# Patient Record
Sex: Female | Born: 1965 | Hispanic: No | Marital: Married | Smoking: Former smoker
Health system: Southern US, Community
[De-identification: ages and names within clinical notes are randomized; demographics above are authoritative.]

## PROBLEM LIST (undated history)

## (undated) DIAGNOSIS — Z8619 Personal history of other infectious and parasitic diseases: Secondary | ICD-10-CM

## (undated) DIAGNOSIS — G709 Myoneural disorder, unspecified: Secondary | ICD-10-CM

## (undated) DIAGNOSIS — E785 Hyperlipidemia, unspecified: Secondary | ICD-10-CM

## (undated) DIAGNOSIS — J45909 Unspecified asthma, uncomplicated: Secondary | ICD-10-CM

## (undated) DIAGNOSIS — K219 Gastro-esophageal reflux disease without esophagitis: Secondary | ICD-10-CM

## (undated) DIAGNOSIS — F32A Depression, unspecified: Secondary | ICD-10-CM

## (undated) DIAGNOSIS — M1990 Unspecified osteoarthritis, unspecified site: Secondary | ICD-10-CM

## (undated) DIAGNOSIS — T7840XA Allergy, unspecified, initial encounter: Secondary | ICD-10-CM

## (undated) DIAGNOSIS — I872 Venous insufficiency (chronic) (peripheral): Secondary | ICD-10-CM

## (undated) DIAGNOSIS — I10 Essential (primary) hypertension: Secondary | ICD-10-CM

## (undated) DIAGNOSIS — F419 Anxiety disorder, unspecified: Secondary | ICD-10-CM

## (undated) DIAGNOSIS — E119 Type 2 diabetes mellitus without complications: Secondary | ICD-10-CM

## (undated) DIAGNOSIS — F329 Major depressive disorder, single episode, unspecified: Secondary | ICD-10-CM

## (undated) HISTORY — DX: Depression, unspecified: F32.A

## (undated) HISTORY — DX: Type 2 diabetes mellitus without complications: E11.9

## (undated) HISTORY — DX: Anxiety disorder, unspecified: F41.9

## (undated) HISTORY — DX: Venous insufficiency (chronic) (peripheral): I87.2

## (undated) HISTORY — PX: BILATERAL CARPAL TUNNEL RELEASE: SHX6508

## (undated) HISTORY — DX: Hyperlipidemia, unspecified: E78.5

## (undated) HISTORY — DX: Allergy, unspecified, initial encounter: T78.40XA

## (undated) HISTORY — PX: TUBAL LIGATION: SHX77

## (undated) HISTORY — DX: Myoneural disorder, unspecified: G70.9

## (undated) HISTORY — PX: LAPAROSCOPIC LYSIS OF ADHESIONS: SHX5905

## (undated) HISTORY — DX: Personal history of other infectious and parasitic diseases: Z86.19

## (undated) HISTORY — DX: Major depressive disorder, single episode, unspecified: F32.9

## (undated) HISTORY — DX: Unspecified osteoarthritis, unspecified site: M19.90

## (undated) HISTORY — DX: Gastro-esophageal reflux disease without esophagitis: K21.9

---
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
## 1998-02-01 ENCOUNTER — Ambulatory Visit (HOSPITAL_COMMUNITY): Admission: RE | Admit: 1998-02-01 | Discharge: 1998-02-01 | Payer: Self-pay | Admitting: Obstetrics and Gynecology

## 2004-08-16 ENCOUNTER — Emergency Department: Payer: Self-pay | Admitting: Emergency Medicine

## 2007-08-16 ENCOUNTER — Emergency Department: Payer: Self-pay | Admitting: Internal Medicine

## 2007-09-04 ENCOUNTER — Emergency Department: Payer: Self-pay | Admitting: Emergency Medicine

## 2007-11-09 ENCOUNTER — Ambulatory Visit: Payer: Self-pay | Admitting: Family Medicine

## 2008-06-30 ENCOUNTER — Emergency Department: Payer: Self-pay | Admitting: Internal Medicine

## 2008-08-18 ENCOUNTER — Ambulatory Visit: Payer: Self-pay | Admitting: Family Medicine

## 2009-02-12 ENCOUNTER — Ambulatory Visit: Payer: Self-pay | Admitting: Cardiovascular Disease

## 2009-03-16 ENCOUNTER — Ambulatory Visit: Payer: Self-pay | Admitting: Family Medicine

## 2009-03-24 ENCOUNTER — Ambulatory Visit: Payer: Self-pay | Admitting: Orthopedic Surgery

## 2009-04-12 ENCOUNTER — Encounter: Payer: Self-pay | Admitting: Orthopedic Surgery

## 2009-04-25 ENCOUNTER — Encounter: Payer: Self-pay | Admitting: Orthopedic Surgery

## 2009-05-26 ENCOUNTER — Encounter: Payer: Self-pay | Admitting: Orthopedic Surgery

## 2009-06-07 ENCOUNTER — Ambulatory Visit: Payer: Self-pay | Admitting: Family Medicine

## 2009-06-23 ENCOUNTER — Ambulatory Visit: Payer: Self-pay | Admitting: Gastroenterology

## 2009-06-29 ENCOUNTER — Ambulatory Visit: Payer: Self-pay | Admitting: Orthopedic Surgery

## 2009-07-07 ENCOUNTER — Encounter: Payer: Self-pay | Admitting: Orthopedic Surgery

## 2009-07-26 ENCOUNTER — Encounter: Payer: Self-pay | Admitting: Orthopedic Surgery

## 2009-08-25 ENCOUNTER — Ambulatory Visit: Payer: Self-pay | Admitting: Internal Medicine

## 2009-08-25 HISTORY — PX: SHOULDER SURGERY: SHX246

## 2009-09-01 ENCOUNTER — Ambulatory Visit: Payer: Self-pay | Admitting: Internal Medicine

## 2009-09-20 ENCOUNTER — Ambulatory Visit: Payer: Self-pay | Admitting: Orthopedic Surgery

## 2009-09-23 ENCOUNTER — Ambulatory Visit: Payer: Self-pay | Admitting: Orthopedic Surgery

## 2009-09-25 ENCOUNTER — Ambulatory Visit: Payer: Self-pay | Admitting: Internal Medicine

## 2009-10-04 ENCOUNTER — Encounter: Payer: Self-pay | Admitting: Orthopedic Surgery

## 2009-10-26 ENCOUNTER — Encounter: Payer: Self-pay | Admitting: Orthopedic Surgery

## 2010-01-11 LAB — HM PAP SMEAR: HM PAP: NEGATIVE

## 2010-01-13 ENCOUNTER — Ambulatory Visit: Payer: Self-pay | Admitting: Orthopedic Surgery

## 2010-01-20 ENCOUNTER — Ambulatory Visit: Payer: Self-pay | Admitting: Orthopedic Surgery

## 2010-01-24 ENCOUNTER — Ambulatory Visit: Payer: Self-pay | Admitting: Family Medicine

## 2010-02-10 ENCOUNTER — Ambulatory Visit: Payer: Self-pay | Admitting: Family Medicine

## 2010-02-10 LAB — HM MAMMOGRAPHY: HM MAMMO: NEGATIVE

## 2010-04-05 ENCOUNTER — Ambulatory Visit: Payer: Self-pay | Admitting: Otolaryngology

## 2010-04-12 ENCOUNTER — Ambulatory Visit: Payer: Self-pay | Admitting: Otolaryngology

## 2010-11-11 ENCOUNTER — Ambulatory Visit: Payer: Self-pay | Admitting: Anesthesiology

## 2010-11-15 ENCOUNTER — Ambulatory Visit: Payer: Self-pay | Admitting: Orthopedic Surgery

## 2012-04-23 ENCOUNTER — Ambulatory Visit: Payer: Self-pay | Admitting: Family Medicine

## 2014-09-10 LAB — HEMOGLOBIN A1C, FINGERSTICK
ESTIMATED AVERAGE GLUCOSE: 326
HEMOGLOBIN-A1C: 13.9

## 2014-12-01 LAB — HEMOGLOBIN A1C, FINGERSTICK
ESTIMATED AVERAGE GLUCOSE: 326
GLUCOSE: 431
HEMOGLOBIN-A1C: 13.4

## 2015-04-06 ENCOUNTER — Telehealth: Payer: Self-pay | Admitting: Family Medicine

## 2015-04-06 NOTE — Telephone Encounter (Signed)
Patient reports that she is ok to wait until Thursday. Scheduled patient in same day slot. Hope that was ok. If not, i can call patient back to reschedule. Thanks!

## 2015-04-06 NOTE — Telephone Encounter (Signed)
My schedule is full on Wednesday, but there are plenty of openings on Thursday. If she is having any fever or significant pain and can't wait until Thursday, then she will need to go to Urgent Care.

## 2015-04-06 NOTE — Telephone Encounter (Signed)
Pt needs appt for tomorrow for a knot on her back.  Please advise as to what time. Thanks, Fortune Brandsteri

## 2015-04-07 ENCOUNTER — Encounter: Payer: Self-pay | Admitting: *Deleted

## 2015-04-07 DIAGNOSIS — F419 Anxiety disorder, unspecified: Secondary | ICD-10-CM

## 2015-04-07 DIAGNOSIS — F32A Depression, unspecified: Secondary | ICD-10-CM | POA: Insufficient documentation

## 2015-04-07 DIAGNOSIS — E11319 Type 2 diabetes mellitus with unspecified diabetic retinopathy without macular edema: Secondary | ICD-10-CM | POA: Insufficient documentation

## 2015-04-07 DIAGNOSIS — F329 Major depressive disorder, single episode, unspecified: Secondary | ICD-10-CM | POA: Insufficient documentation

## 2015-04-07 DIAGNOSIS — R208 Other disturbances of skin sensation: Secondary | ICD-10-CM | POA: Insufficient documentation

## 2015-04-07 DIAGNOSIS — E10649 Type 1 diabetes mellitus with hypoglycemia without coma: Secondary | ICD-10-CM | POA: Insufficient documentation

## 2015-04-07 DIAGNOSIS — Z8249 Family history of ischemic heart disease and other diseases of the circulatory system: Secondary | ICD-10-CM | POA: Insufficient documentation

## 2015-04-07 DIAGNOSIS — Z8619 Personal history of other infectious and parasitic diseases: Secondary | ICD-10-CM | POA: Insufficient documentation

## 2015-04-07 DIAGNOSIS — E785 Hyperlipidemia, unspecified: Secondary | ICD-10-CM | POA: Insufficient documentation

## 2015-04-07 DIAGNOSIS — I872 Venous insufficiency (chronic) (peripheral): Secondary | ICD-10-CM | POA: Insufficient documentation

## 2015-04-07 DIAGNOSIS — E139 Other specified diabetes mellitus without complications: Secondary | ICD-10-CM | POA: Insufficient documentation

## 2015-04-07 DIAGNOSIS — G4700 Insomnia, unspecified: Secondary | ICD-10-CM | POA: Insufficient documentation

## 2015-04-07 DIAGNOSIS — K219 Gastro-esophageal reflux disease without esophagitis: Secondary | ICD-10-CM | POA: Insufficient documentation

## 2015-04-07 DIAGNOSIS — Z720 Tobacco use: Secondary | ICD-10-CM | POA: Insufficient documentation

## 2015-04-07 DIAGNOSIS — J309 Allergic rhinitis, unspecified: Secondary | ICD-10-CM | POA: Insufficient documentation

## 2015-04-07 HISTORY — DX: Personal history of other infectious and parasitic diseases: Z86.19

## 2015-04-08 ENCOUNTER — Ambulatory Visit (INDEPENDENT_AMBULATORY_CARE_PROVIDER_SITE_OTHER): Payer: Medicaid Other | Admitting: Family Medicine

## 2015-04-08 ENCOUNTER — Encounter: Payer: Self-pay | Admitting: Family Medicine

## 2015-04-08 VITALS — BP 128/78 | HR 84 | Temp 98.1°F | Resp 16 | Ht 62.5 in | Wt 210.0 lb

## 2015-04-08 DIAGNOSIS — J0110 Acute frontal sinusitis, unspecified: Secondary | ICD-10-CM | POA: Diagnosis not present

## 2015-04-08 DIAGNOSIS — L0390 Cellulitis, unspecified: Secondary | ICD-10-CM

## 2015-04-08 DIAGNOSIS — F419 Anxiety disorder, unspecified: Secondary | ICD-10-CM

## 2015-04-08 DIAGNOSIS — L0291 Cutaneous abscess, unspecified: Secondary | ICD-10-CM

## 2015-04-08 DIAGNOSIS — J309 Allergic rhinitis, unspecified: Secondary | ICD-10-CM | POA: Diagnosis not present

## 2015-04-08 MED ORDER — LEVOFLOXACIN 500 MG PO TABS: 500.0000 mg | ORAL_TABLET | Freq: Every day | ORAL | Status: DC

## 2015-04-08 MED ORDER — CLONAZEPAM 0.5 MG PO TABS: 0.5000 mg | ORAL_TABLET | Freq: Every day | ORAL | Status: DC

## 2015-04-08 MED ORDER — FLUTICASONE PROPIONATE 50 MCG/ACT NA SUSP: 2.0000 | Freq: Every day | NASAL | Status: DC

## 2015-04-08 NOTE — Progress Notes (Signed)
Subjective:    Patient ID: Emily Collins, female    DOB: [birth year], 49 y.o.   MRN: [id number]  Sinusitis The current episode started more than 1 month ago. The problem has been gradually worsening since onset. There has been no fever. Her pain is at a severity of 9/10. The pain is severe. Associated symptoms include congestion, coughing, ear pain, headaches, sinus pressure, sneezing and a sore throat. Pertinent negatives include no shortness of breath. Past treatments include spray decongestants, acetaminophen and oral decongestants. The treatment provided no relief.   Abscess: Patient presents for evaluation of a cutaneous abscess. Lesion is located on her back (Below her right shoulder. Onset was 1 month ago. Symptoms have gradually worsened. Abscess has associated symptoms of pain. Patient does not have previous history of cutaneous abscesses. Patient does have diabetes.  Patient Active Problem List   Diagnosis Date Noted  . Allergic rhinitis 04/07/2015  . History of chicken pox 04/07/2015  . Depression 04/07/2015  . Diabetes mellitus type 1.5,uncontrolled, managed as type 2 04/07/2015  . Diabetic retinopathy 04/07/2015  . Dysesthesia 04/07/2015  . Family history of early CAD 04/07/2015  . GERD (gastroesophageal reflux disease) 04/07/2015  . Hyperlipidemia 04/07/2015  . Insomnia 04/07/2015  . Tobacco abuse 04/07/2015  . Venous insufficiency 04/07/2015  . Anxiety 04/07/2015   Family History  Problem Relation Age of Onset  . Hypertension Mother   . Diabetes Mother   . Hyperlipidemia Mother   . Diabetes Father   . Heart disease Father   . Kidney disease Father    History   Social History  . Marital Status: Married    Spouse Name: Jonny Ruiz  . Number of Children: 3  . Years of Education: H/S   Occupational History  . Housewife    Social History Main Topics  . Smoking status: Current Every Day Smoker -- 1.00 packs/day for 30 years    Types: Cigarettes    Start date:  04/06/1985  . Smokeless tobacco: Never Used  . Alcohol Use: No  . Drug Use: No  . Sexual Activity: Not on file   Other Topics Concern  . Not on file   Social History Narrative   Past Surgical History  Procedure Laterality Date  . Cesarean section  2003  . Appendectomy  1979  . Tubal ligation    . Shoulder surgery Right 08/2009    Dr. Rosita Kea  . Bilateral carpal tunnel release Bilateral     Dr. Rosita Kea  . Laparoscopic lysis of adhesions     Allergies  Allergen Reactions  . Levaquin  [Levofloxacin In D5w]     GI upset  . Simvastatin     joint aches.  . Dilaudid  [Hydromorphone Hcl] Hives, Itching and Rash  . Penicillins Rash  . Tetracycline Rash   Previous Medications   ATORVASTATIN (LIPITOR) 10 MG TABLET    Take by mouth.   CETIRIZINE (ZYRTEC ALLERGY) 10 MG TABLET    Take by mouth.   CLONAZEPAM (KLONOPIN) 0.5 MG TABLET    Take by mouth.   FLUOXETINE (PROZAC) 40 MG CAPSULE    Take by mouth.   FLUTICASONE (FLONASE) 50 MCG/ACT NASAL SPRAY    Place into the nose.   FUROSEMIDE (LASIX) 40 MG TABLET    Take by mouth.   INSULIN ASPART (NOVOLOG) 100 UNIT/ML INJECTION    Inject into the skin.   INSULIN GLARGINE (LANTUS SOLOSTAR) 100 UNIT/ML SOLOSTAR PEN    Inject into the skin.   METFORMIN (  GLUCOPHAGE-XR) 500 MG 24 HR TABLET    Take by mouth.   RANITIDINE (ZANTAC) 150 MG TABLET    Take by mouth.   BP 128/78 mmHg  Pulse 84  Temp(Src) 98.1 F (36.7 C) (Oral)  Resp 16  Ht 5' 2.5" (1.588 m)  Wt 210 lb (95.255 kg)  BMI 37.77 kg/m2  LMP 04/07/2015 (Exact Date)   Review of Systems  HENT: Positive for congestion, dental problem (Teeth hurt), ear pain, facial swelling, nosebleeds, postnasal drip, rhinorrhea, sinus pressure, sneezing, sore throat, tinnitus, trouble swallowing and voice change. Negative for ear discharge and hearing loss.   Eyes: Positive for discharge and itching. Negative for photophobia, pain, redness and visual disturbance.  Respiratory: Positive for cough and  wheezing. Negative for apnea, choking, chest tightness, shortness of breath and stridor.   Cardiovascular: Negative for chest pain, palpitations and leg swelling.  Gastrointestinal: Negative for nausea, vomiting, abdominal pain, diarrhea, constipation, blood in stool, abdominal distention, anal bleeding and rectal pain.  Skin: Negative for color change, pallor, rash and wound.  Allergic/Immunologic: Positive for environmental allergies. Negative for food allergies and immunocompromised state.  Neurological: Positive for headaches. Negative for dizziness and light-headedness.       Objective:   Physical Exam  General Appearance:    Alert, cooperative, no distress  Eyes:    PERRL, conjunctiva/corneas clear, EOM's intact       ENT:   Congested hyperemic turbinates with clear and yellow discharge, mild maxillary sinus tenderness bilaterally.   Lungs:     Clear to auscultation bilaterally, respirations unlabored  Heart:    Regular rate and rhythm  Neurologic:   Awake, alert, oriented x 3. No apparent focal neurological           defect.   Skin:  about 3cm oval fluctuate subcutaneous mass below right shoulder with overlying erythema and mild tenderness, no discharge.           Assessment & Plan:  1. Acute frontal sinusitis, recurrence not specified  - levofloxacin (LEVAQUIN) 500 MG tablet; Take 1 tablet (500 mg total) by mouth daily.  Dispense: 14 tablet; Refill: 0  2. Abscess and cellulitis Call if not greatly improved over the weekend on abx above.    3. Acute anxiety -clonazePAM (KLONOPIN) 0.5 MG tablet; Take 1 tablet (0.5 mg total) by mouth at bedtime.  Dispense: 30 tablet; Refill: 5  4. Allergic rhinitis, unspecified allergic rhinitis type Fluticasone nasal spray.

## 2015-04-29 ENCOUNTER — Other Ambulatory Visit: Payer: Self-pay | Admitting: Family Medicine

## 2015-05-13 ENCOUNTER — Telehealth: Payer: Self-pay | Admitting: Family Medicine

## 2015-05-13 DIAGNOSIS — J0110 Acute frontal sinusitis, unspecified: Secondary | ICD-10-CM

## 2015-05-13 MED ORDER — LEVOFLOXACIN 500 MG PO TABS: 500.0000 mg | ORAL_TABLET | Freq: Every day | ORAL | Status: AC

## 2015-05-13 NOTE — Telephone Encounter (Signed)
Pt stated that when she was last here for an OV (04/08/15) she was told that she might need more than one round of antibiotics and pt would like the second round sent to [facility] in [place]. Pt stated that she woke up with a lot of sinus pressure this morning. I advised that since it had been over a month she may need to come back in for OV but pt wanted to ask Dr. Sherrie Mustache first. I didn't see an antibiotic from 04/08/15. Thanks TNP

## 2015-05-13 NOTE — Telephone Encounter (Signed)
Please advise 

## 2015-07-08 ENCOUNTER — Ambulatory Visit (INDEPENDENT_AMBULATORY_CARE_PROVIDER_SITE_OTHER): Payer: Medicaid Other | Admitting: Family Medicine

## 2015-07-08 ENCOUNTER — Other Ambulatory Visit: Payer: Self-pay | Admitting: Family Medicine

## 2015-07-08 ENCOUNTER — Encounter: Payer: Self-pay | Admitting: Family Medicine

## 2015-07-08 VITALS — BP 110/58 | HR 91 | Temp 98.6°F | Resp 16 | Wt 212.0 lb

## 2015-07-08 DIAGNOSIS — F419 Anxiety disorder, unspecified: Secondary | ICD-10-CM | POA: Diagnosis not present

## 2015-07-08 DIAGNOSIS — E139 Other specified diabetes mellitus without complications: Secondary | ICD-10-CM | POA: Diagnosis not present

## 2015-07-08 DIAGNOSIS — L0390 Cellulitis, unspecified: Secondary | ICD-10-CM | POA: Diagnosis not present

## 2015-07-08 DIAGNOSIS — Z23 Encounter for immunization: Secondary | ICD-10-CM

## 2015-07-08 DIAGNOSIS — Z720 Tobacco use: Secondary | ICD-10-CM | POA: Diagnosis not present

## 2015-07-08 DIAGNOSIS — J40 Bronchitis, not specified as acute or chronic: Secondary | ICD-10-CM

## 2015-07-08 DIAGNOSIS — E113299 Type 2 diabetes mellitus with mild nonproliferative diabetic retinopathy without macular edema, unspecified eye: Secondary | ICD-10-CM

## 2015-07-08 DIAGNOSIS — E785 Hyperlipidemia, unspecified: Secondary | ICD-10-CM

## 2015-07-08 DIAGNOSIS — L0291 Cutaneous abscess, unspecified: Secondary | ICD-10-CM

## 2015-07-08 DIAGNOSIS — J309 Allergic rhinitis, unspecified: Secondary | ICD-10-CM | POA: Diagnosis not present

## 2015-07-08 LAB — POCT GLYCOSYLATED HEMOGLOBIN (HGB A1C)
Est. average glucose Bld gHb Est-mCnc: >326
Hemoglobin A1C: 14

## 2015-07-08 MED ORDER — BENZONATATE 100 MG PO CAPS: 100.0000 mg | ORAL_CAPSULE | Freq: Two times a day (BID) | ORAL | Status: DC | PRN

## 2015-07-08 MED ORDER — MONTELUKAST SODIUM 10 MG PO TABS: 10.0000 mg | ORAL_TABLET | Freq: Every day | ORAL | Status: DC

## 2015-07-08 MED ORDER — CLONAZEPAM 0.5 MG PO TABS: 0.5000 mg | ORAL_TABLET | Freq: Every day | ORAL | Status: DC

## 2015-07-08 MED ORDER — AZITHROMYCIN 250 MG PO TABS: ORAL_TABLET | ORAL | Status: AC

## 2015-07-08 NOTE — Progress Notes (Signed)
Patient: Emily Collins Female    DOB: 15-Apr-1966   49 y.o.   MRN: [id number] Visit Date: 07/08/2015  Today's Provider: Mila Merryonald Zakyra [place], MD   Chief Complaint  Patient presents with  . Follow-up  . Hyperlipidemia  . Anxiety  . Allergic Rhinitis    Subjective:    HPI   Cough She states she has had persistent cough for at least a month. Has had some sinus and nasal congestion and drainage which has been relatively well managed by OTC allergy tablet and Flonase. She had normal spirometry in 2011, but she has cut back smoking quite a bit since then. No fevers or chills. Cough productive white and yellow mucous.    Diabetes Mellitus Type II, Follow-up:   Lab Results  Component Value Date   HGBA1C 13.4 12/01/2014   HGBA1C 13.9 09/10/2014    Last seen for diabetes 7 months ago.  Management since then includes; referred to Endocrinology . She reports good compliance with treatment. She is not having side effects. none Current symptoms include none and have been resolved. Home blood sugar records: fasting range: 88-188  Episodes of hypoglycemia? no   Current Insulin Regimen: 65 units Lantus daily and sliding scale novolog.  Most Recent Eye Exam: due Weight trend: fluctuating a bit Prior visit with dietician: no Current diet: in general, an "unhealthy" diet Current exercise: none   Wt Readings from Last 3 Encounters:  07/08/15 212 lb (96.163 kg)  04/08/15 210 lb (95.255 kg)  12/01/14 200 lb (90.719 kg)    ------------------------------------------------------------------------     Lipid/Cholesterol, Follow-up:   Last seen for this7 months ago.  Management changes since that visit include; changed Lipitor 10 mg qd Risk factors for vascular disease include diabetes mellitus  She reports good compliance with treatment. She is not having side effects.   Current exercise: none  Wt Readings from Last 3 Encounters:  07/08/15 212 lb (96.163 kg)  04/08/15  210 lb (95.255 kg)  12/01/14 200 lb (90.719 kg)    -------------------------------------------------------------------   Allergies  Allergen Reactions  . Levaquin  [Levofloxacin In D5w]     GI upset  . Simvastatin     joint aches.  . Dilaudid  [Hydromorphone Hcl] Hives, Itching and Rash  . Penicillins Rash  . Tetracycline Rash   Previous Medications   ATORVASTATIN (LIPITOR) 10 MG TABLET    Take by mouth.   CETIRIZINE (ZYRTEC ALLERGY) 10 MG TABLET    Take by mouth.   CLONAZEPAM (KLONOPIN) 0.5 MG TABLET    Take 1 tablet (0.5 mg total) by mouth at bedtime.   FLUOXETINE (PROZAC) 40 MG CAPSULE    Take by mouth.   FLUTICASONE (FLONASE) 50 MCG/ACT NASAL SPRAY    Place into the nose.   FLUTICASONE (FLONASE) 50 MCG/ACT NASAL SPRAY    Place 2 sprays into both nostrils daily.   FUROSEMIDE (LASIX) 40 MG TABLET    Take by mouth.   INSULIN ASPART (NOVOLOG) 100 UNIT/ML INJECTION    Inject into the skin.   LANTUS SOLOSTAR 100 UNIT/ML SOLOSTAR PEN    ADMINISTER 65 UNITS UNDER THE SKIN DAILY   METFORMIN (GLUCOPHAGE-XR) 500 MG 24 HR TABLET    Take by mouth.   RANITIDINE (ZANTAC) 150 MG TABLET    Take by mouth.    Review of Systems  Constitutional: Negative for fever.  HENT: Positive for congestion and ear pain.        Left  Respiratory: Positive  for cough, shortness of breath and wheezing.        Had cough for over a month  Cardiovascular: Positive for leg swelling. Negative for chest pain and palpitations.  Neurological: Positive for dizziness and headaches. Negative for light-headedness.    Social History  Substance Use Topics  . Smoking status: Current Every Day Smoker -- 1.00 packs/day for 30 years    Types: Cigarettes    Start date: 04/06/1985  . Smokeless tobacco: Never Used  . Alcohol Use: No   Objective:   BP 110/58 mmHg  Pulse 91  Temp(Src) 98.6 F (37 C) (Oral)  Resp 16  Wt 212 lb (96.163 kg)  SpO2 98%  Physical Exam   General Appearance:    Alert, cooperative, no  distress, obese  Eyes:    PERRL, conjunctiva/corneas clear, EOM's intact       HEENT:   Pale boggy turbinates.   Lungs:     Diffuse expiratory wheezes, no rales, respirations unlabored  Heart:    Regular rate and rhythm  Neurologic:   Awake, alert, oriented x 3. No apparent focal neurological           defect.       Results for orders placed or performed in visit on 07/08/15  POCT glycosylated hemoglobin (Hb A1C)  Result Value Ref Range   Hemoglobin A1C 14.0    Est. average glucose Bld gHb Est-mCnc >326        Assessment & Plan:     1. Diabetes mellitus type 1.5,uncontrolled, managed as type 2 Worsening despite patient reports of improving fasting sugars since restarting Lantus. She is to records her sugars. Refer to new endocrinologist and bring copy of BS recordings.  - POCT glycosylated hemoglobin (Hb A1C) - Renal function panel - Ambulatory referral to Endocrinology  2. Allergic rhinitis, unspecified allergic rhinitis type -montelukast  daily  3. Tobacco abuse Stop smoking  4. Bronchitis  - azithromycin (ZITHROMAX) 250 MG tablet; 2 by mouth today, then 1 daily for 4 days  Dispense: 6 tablet; Refill: 0 - benzonatate (TESSALON) 100 MG capsule; Take 1 capsule (100 mg total) by mouth 2 (two) times daily as needed for cough.  Dispense: 20 capsule; Refill: 0  5. Need for influenza vaccination  - Flu Vaccine QUAD 36+ mos IM  6. Abscess and cellulitis Resolved.   7. 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 Hyperlipidemia She is tolerating atorvastatin well with no adverse effects.   - Lipid panel - Hepatic function panel  9. Anxiety Needs refill clonazepam which she states is working well.  - clonazePAM (KLONOPIN) 0.5 MG tablet; Take 1 tablet (0.5 mg total) by mouth at bedtime.  Dispense: 30 tablet; Refill: 5       Mila Merry, MD  [facility]

## 2015-07-08 NOTE — Patient Instructions (Signed)
Please stop smoking 

## 2015-07-29 ENCOUNTER — Other Ambulatory Visit: Payer: Self-pay | Admitting: Family Medicine

## 2015-07-29 DIAGNOSIS — F329 Major depressive disorder, single episode, unspecified: Secondary | ICD-10-CM

## 2015-07-29 DIAGNOSIS — F32A Depression, unspecified: Secondary | ICD-10-CM

## 2015-09-21 ENCOUNTER — Other Ambulatory Visit: Payer: Self-pay | Admitting: Family Medicine

## 2015-10-08 ENCOUNTER — Ambulatory Visit: Payer: Medicaid Other | Admitting: Family Medicine

## 2015-12-15 ENCOUNTER — Other Ambulatory Visit: Payer: Self-pay | Admitting: Family Medicine

## 2015-12-24 ENCOUNTER — Other Ambulatory Visit: Payer: Self-pay | Admitting: Family Medicine

## 2016-01-12 ENCOUNTER — Other Ambulatory Visit: Payer: Self-pay | Admitting: Family Medicine

## 2016-01-24 ENCOUNTER — Other Ambulatory Visit: Payer: Self-pay | Admitting: Family Medicine

## 2016-02-09 ENCOUNTER — Other Ambulatory Visit: Payer: Self-pay | Admitting: Family Medicine

## 2016-03-03 ENCOUNTER — Other Ambulatory Visit: Payer: Self-pay | Admitting: Family Medicine

## 2016-03-03 DIAGNOSIS — E139 Other specified diabetes mellitus without complications: Secondary | ICD-10-CM

## 2016-03-04 NOTE — Telephone Encounter (Signed)
Last OV 06/2015; pt "no showed" 09/2015  Thanks,   -Vernona RiegerLaura

## 2016-03-22 ENCOUNTER — Other Ambulatory Visit: Payer: Self-pay | Admitting: Family Medicine

## 2016-04-05 ENCOUNTER — Encounter: Payer: Self-pay | Admitting: Family Medicine

## 2016-04-05 ENCOUNTER — Other Ambulatory Visit: Payer: Self-pay | Admitting: Family Medicine

## 2016-04-05 ENCOUNTER — Ambulatory Visit (INDEPENDENT_AMBULATORY_CARE_PROVIDER_SITE_OTHER): Payer: Medicaid Other | Admitting: Family Medicine

## 2016-04-05 VITALS — BP 120/80 | HR 80 | Temp 98.0°F | Resp 20 | Ht 63.0 in | Wt 219.0 lb

## 2016-04-05 DIAGNOSIS — E113299 Type 2 diabetes mellitus with mild nonproliferative diabetic retinopathy without macular edema, unspecified eye: Secondary | ICD-10-CM

## 2016-04-05 DIAGNOSIS — E139 Other specified diabetes mellitus without complications: Secondary | ICD-10-CM

## 2016-04-05 DIAGNOSIS — N182 Chronic kidney disease, stage 2 (mild): Secondary | ICD-10-CM | POA: Diagnosis not present

## 2016-04-05 DIAGNOSIS — N189 Chronic kidney disease, unspecified: Secondary | ICD-10-CM | POA: Insufficient documentation

## 2016-04-05 DIAGNOSIS — M2550 Pain in unspecified joint: Secondary | ICD-10-CM

## 2016-04-05 DIAGNOSIS — K219 Gastro-esophageal reflux disease without esophagitis: Secondary | ICD-10-CM

## 2016-04-05 LAB — POCT UA - MICROALBUMIN: MICROALBUMIN (UR) POC: 50 mg/L

## 2016-04-05 LAB — POCT GLYCOSYLATED HEMOGLOBIN (HGB A1C): HEMOGLOBIN A1C: 13.1

## 2016-04-05 MED ORDER — INSULIN GLARGINE 300 UNIT/ML ~~LOC~~ SOPN: 40.0000 [IU] | PEN_INJECTOR | Freq: Two times a day (BID) | SUBCUTANEOUS | Status: DC

## 2016-04-05 MED ORDER — MELOXICAM 15 MG PO TABS: 15.0000 mg | ORAL_TABLET | Freq: Every day | ORAL | Status: DC | PRN

## 2016-04-05 NOTE — Patient Instructions (Signed)
Check blood sugar before each meal      If blood sugar is >=150, but <200, take 4 units Humalog insulin before eating     If blood sugar is >=200, but <250, take 6 units Humalog insulin     If blood sugar is >=250, but <300, take 8 units Humalog insulin     If blood sugar is >=300, but <350, take 10 units Humalog insulin     If blood sugar is >=350, but <400, take 12 units Humalog insulin     If blood sugar is >400, take 14 units Humalog insulin

## 2016-04-05 NOTE — Progress Notes (Signed)
Patient: Emily Collins Female    DOB: 1966-03-11   50 y.o.   MRN: [id number] Visit Date: 04/05/2016  Today's Provider: Lelon Huh, MD   Chief Complaint  Patient presents with  . Diabetes  . Hyperlipidemia  . Joint Pain   Subjective:    HPI  Diabetes Mellitus Type II, Follow-up:   Lab Results  Component Value Date   HGBA1C 13.1 04/05/2016   HGBA1C 14.0 07/08/2015   HGBA1C 13.4 12/01/2014    Last seen for diabetes 9 months ago.  Management since then includes referred to endocrinology. Patient reports that she has not been able to go to endo due to her husbands medical problems. She reports good compliance with treatment. She is not having side effects. Current symptoms include hyperglycemia, paresthesia of the feet and visual disturbances and have been unchanged. Home blood sugar records: fasting range: 220's  Episodes of hypoglycemia? no   Current Insulin Regimen: Humalog on a sliding scale, Lantus 40 unit BID for the last two weeks. Was previously on 10 daily.  On sliding scale Humalog up to 6 units before meals.  She wants to change Lantus to Toujeo and start Jardiance.  Most Recent Eye Exam: Weight trend: stable Prior visit with dietician: no Current diet: in general, a "healthy" diet   Current exercise: none  Pertinent Labs: No results found for: CHOL, TRIG, HDL, LDLCALC, CREATININE  Wt Readings from Last 3 Encounters:  04/05/16 219 lb (99.338 kg)  07/08/15 212 lb (96.163 kg)  04/08/15 210 lb (95.255 kg)   ------------------------------------------------------------------------   Lipid/Cholesterol, Follow-up:   Last seen for this 9 months ago.  Management changes since that visit include labs ordered. . Last Lipid Panel: No results found for: CHOL, TRIG, HDL, CHOLHDL, VLDL, LDLCALC, LDLDIRECT  Risk factors for vascular disease include diabetes mellitus  She reports good compliance with treatment. She is not having side effects.    Current symptoms include hyperglycemia and have been unchanged. ------------------------------------------------------------------  Joint pain: Patient reports joint pain all over body for several years. Patient reports her pain is worsening and takes Advil 400 mg daily. She reports she has seen Dr. Jefm Bryant in the past and diagnosed with psoriatic arthritis.     Allergies  Allergen Reactions  . Levaquin  [Levofloxacin In D5w]     GI upset  . Simvastatin     joint aches.  . Dilaudid  [Hydromorphone Hcl] Hives, Itching and Rash  . Penicillins Rash  . Tetracycline Rash   Current Meds  Medication Sig  . atorvastatin (LIPITOR) 10 MG tablet Take 10 mg by mouth daily at 6 PM.   . cetirizine (ZYRTEC ALLERGY) 10 MG tablet Take 10 mg by mouth daily.   . clonazePAM (KLONOPIN) 0.5 MG tablet Take 1 tablet (0.5 mg total) by mouth at bedtime.  Marland Kitchen esomeprazole (NEXIUM) 20 MG capsule Take 20 mg by mouth daily at 12 noon.  Marland Kitchen FLUoxetine (PROZAC) 40 MG capsule TAKE 1 CAPSULE BY MOUTH EVERY DAY  . fluticasone (FLONASE) 50 MCG/ACT nasal spray Place into the nose.  . furosemide (LASIX) 40 MG tablet TAKE 1 TABLET BY MOUTH AS NEEDED FOR SWELLING  . Insulin Glargine (LANTUS SOLOSTAR) 100 UNIT/ML Solostar Pen Inject 15 Units into the skin daily at 10 pm.  . insulin lispro (HUMALOG) 100 UNIT/ML injection Inject 40 Units into the skin 2 (two) times daily.  . montelukast (SINGULAIR) 10 MG tablet TAKE 1 TABLET BY MOUTH EVERY NIGHT AT BEDTIME  . [  DISCONTINUED] insulin aspart (NOVOLOG) 100 UNIT/ML injection Inject into the skin.    Review of Systems  Constitutional: Negative.   Eyes: Positive for visual disturbance.  Endocrine: Positive for polyuria.  Musculoskeletal: Positive for arthralgias.  Neurological: Positive for numbness.    Social History  Substance Use Topics  . Smoking status: Current Every Day Smoker -- 1.00 packs/day for 30 years    Types: Cigarettes    Start date: 04/06/1985  . Smokeless  tobacco: Never Used  . Alcohol Use: No   Objective:   BP 120/80 mmHg  Pulse 80  Temp(Src) 98 F (36.7 C) (Oral)  Resp 20  Ht _0  (1.6 m)  Wt 219 lb (99.338 kg)  BMI 38.80 kg/m2  LMP 03/02/2016 (Approximate)  Physical Exam  General Appearance:    Alert, cooperative, no distress, obese  Eyes:    PERRL, conjunctiva/corneas clear, EOM's intact       Lungs:     Clear to auscultation bilaterally, respirations unlabored  Heart:    Regular rate and rhythm  Neurologic:   Awake, alert, oriented x 3. No apparent focal neurological           defect.        Lab Results  Component Value Date   HGBA1C 13.1 04/05/2016       Assessment & Plan:     1. Diabetes mellitus type 1.5,uncontrolled, managed as type 2 She states that her previous endocrinologist diagnosed her with Type 1 diabetes. Will verify with C-peptide.  She is on very low Sliding scale humalog and will increase as per discharge instructions. Extensively counseled on blood sugar monitoring and SSI.  Change from Lantus to Toujeo at same dose for now.   - POCT glycosylated hemoglobin (Hb A1C) - POCT UA - Microalbumin - Renal function panel - Insulin and C-Peptide - Lipid panel - Hepatic function panel - Insulin Glargine (TOUJEO SOLOSTAR) 300 UNIT/ML SOPN; Inject 40 Units into the skin 2 (two) times daily.  Dispense: 10 pen; Refill: 5  2. 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 Chronic kidney disease, stage 2 (mild) Check on eGFR. Consider ACEI. Will discuss further at follow up o.v.   4. Gastroesophageal reflux disease without esophagitis States esoomeprazole remains effective and requests refill.  - esomeprazole (NEXIUM) 20 MG capsule; Take 20 mg by mouth daily at 12 noon.  5. Arthralgia By patient report was previously diagnosed with psoriatic arthritis by Dr. Jefm Bryant. States she doesn't have time for rheumatology follow up now due to her  husbands illness. Will get prn meloxicam for the time being.  - meloxicam (MOBIC) 15 MG tablet; Take 1 tablet (15 mg total) by mouth daily as needed (arthritis pain). Take with food  Dispense: 30 tablet; Refill: 0     Addressed extensive list of chronic and acute medical problems today requiring extensive time in counseling and coordination care.  Over half of this 45 minute visit were spent in counseling and coordinating care of multiple medical problems.   Return in about 6 weeks (around 05/17/2016).   Lelon Huh, MD  [facility]

## 2016-04-06 ENCOUNTER — Other Ambulatory Visit: Payer: Self-pay

## 2016-04-06 DIAGNOSIS — F329 Major depressive disorder, single episode, unspecified: Secondary | ICD-10-CM

## 2016-04-06 DIAGNOSIS — F419 Anxiety disorder, unspecified: Secondary | ICD-10-CM

## 2016-04-06 DIAGNOSIS — K219 Gastro-esophageal reflux disease without esophagitis: Secondary | ICD-10-CM

## 2016-04-06 DIAGNOSIS — F32A Depression, unspecified: Secondary | ICD-10-CM

## 2016-04-06 LAB — LIPID PANEL
CHOL/HDL RATIO: 7.7 ratio — AB (ref 0.0–4.4)
Cholesterol, Total: 247 mg/dL — ABNORMAL HIGH (ref 100–199)
HDL: 32 mg/dL — AB (ref >39)
LDL Calculated: 177 mg/dL — ABNORMAL HIGH (ref 0–99)
Triglycerides: 189 mg/dL — ABNORMAL HIGH (ref 0–149)
VLDL Cholesterol Cal: 38 mg/dL (ref 5–40)

## 2016-04-06 LAB — HEPATIC FUNCTION PANEL
ALT: 24 IU/L (ref 0–32)
AST: 22 IU/L (ref 0–40)
Alkaline Phosphatase: 89 IU/L (ref 39–117)
BILIRUBIN, DIRECT: 0.06 mg/dL (ref 0.00–0.40)
Total Protein: 6.5 g/dL (ref 6.0–8.5)

## 2016-04-06 LAB — RENAL FUNCTION PANEL
Albumin: 3.7 g/dL (ref 3.5–5.5)
BUN/Creatinine Ratio: 23 (ref 9–23)
BUN: 14 mg/dL (ref 6–24)
CALCIUM: 8.8 mg/dL (ref 8.7–10.2)
CHLORIDE: 101 mmol/L (ref 96–106)
CO2: 20 mmol/L (ref 18–29)
CREATININE: 0.6 mg/dL (ref 0.57–1.00)
GFR calc Af Amer: 123 mL/min/{1.73_m2} (ref >59)
GFR calc non Af Amer: 107 mL/min/{1.73_m2} (ref >59)
Glucose: 282 mg/dL — ABNORMAL HIGH (ref 65–99)
PHOSPHORUS: 3.3 mg/dL (ref 2.5–4.5)
Potassium: 5.2 mmol/L (ref 3.5–5.2)
SODIUM: 136 mmol/L (ref 134–144)

## 2016-04-06 LAB — INSULIN AND C-PEPTIDE, SERUM: INSULIN: 3.7 u[IU]/mL (ref 2.6–24.9)

## 2016-04-06 MED ORDER — FUROSEMIDE 40 MG PO TABS: ORAL_TABLET | ORAL | Status: DC

## 2016-04-06 MED ORDER — INSULIN LISPRO 100 UNIT/ML ~~LOC~~ SOLN: 40.0000 [IU] | Freq: Two times a day (BID) | SUBCUTANEOUS | Status: DC

## 2016-04-06 MED ORDER — MONTELUKAST SODIUM 10 MG PO TABS: 10.0000 mg | ORAL_TABLET | Freq: Every day | ORAL | Status: DC

## 2016-04-06 MED ORDER — ESOMEPRAZOLE MAGNESIUM 20 MG PO CPDR: 20.0000 mg | DELAYED_RELEASE_CAPSULE | Freq: Every day | ORAL | Status: DC

## 2016-04-06 MED ORDER — ATORVASTATIN CALCIUM 10 MG PO TABS: 10.0000 mg | ORAL_TABLET | Freq: Every day | ORAL | Status: DC

## 2016-04-06 MED ORDER — FLUOXETINE HCL 40 MG PO CAPS: ORAL_CAPSULE | ORAL | Status: DC

## 2016-04-06 MED ORDER — CLONAZEPAM 0.5 MG PO TABS: 0.5000 mg | ORAL_TABLET | Freq: Every day | ORAL | Status: DC

## 2016-04-06 NOTE — Telephone Encounter (Signed)
Please call in clonazepam  

## 2016-04-06 NOTE — Telephone Encounter (Signed)
Patient was seen yesterday and states she needs refills on all her medications. I advised her that the Nathen Mayoujeo is not covered by her insurance. Lab results are in waiting to be reviewed.

## 2016-04-07 ENCOUNTER — Other Ambulatory Visit: Payer: Self-pay

## 2016-04-07 DIAGNOSIS — E139 Other specified diabetes mellitus without complications: Secondary | ICD-10-CM

## 2016-04-07 DIAGNOSIS — J309 Allergic rhinitis, unspecified: Secondary | ICD-10-CM

## 2016-04-07 DIAGNOSIS — K219 Gastro-esophageal reflux disease without esophagitis: Secondary | ICD-10-CM

## 2016-04-07 DIAGNOSIS — E785 Hyperlipidemia, unspecified: Secondary | ICD-10-CM

## 2016-04-07 MED ORDER — FLUTICASONE PROPIONATE 50 MCG/ACT NA SUSP: NASAL | Status: DC

## 2016-04-07 MED ORDER — INSULIN LISPRO 100 UNIT/ML ~~LOC~~ SOLN: 40.0000 [IU] | Freq: Two times a day (BID) | SUBCUTANEOUS | Status: DC

## 2016-04-07 MED ORDER — ATORVASTATIN CALCIUM 40 MG PO TABS: 40.0000 mg | ORAL_TABLET | Freq: Every day | ORAL | Status: DC

## 2016-04-07 MED ORDER — ESOMEPRAZOLE MAGNESIUM 20 MG PO CPDR: 20.0000 mg | DELAYED_RELEASE_CAPSULE | Freq: Every day | ORAL | Status: DC

## 2016-04-07 MED ORDER — CETIRIZINE HCL 10 MG PO TABS: 10.0000 mg | ORAL_TABLET | Freq: Every day | ORAL | Status: DC

## 2016-04-07 NOTE — Telephone Encounter (Signed)
Pt advised.  She also needs refills of her medicines.  [facility].    Thanks,   -Vernona RiegerLaura

## 2016-04-07 NOTE — Telephone Encounter (Signed)
-----   Message from [name], MD sent at 04/07/2016  8:10 AM EDT ----- Cholesterol is very high. Need to increase atorvastatin to 40mg  daily, #30, rf x 3. Also, be sure to take 81mg  aspirin every day if not already doing so. Follow up August as scheduled. .Emily Collins

## 2016-04-19 ENCOUNTER — Other Ambulatory Visit: Payer: Self-pay | Admitting: Family Medicine

## 2016-04-19 DIAGNOSIS — E139 Other specified diabetes mellitus without complications: Secondary | ICD-10-CM

## 2016-04-21 ENCOUNTER — Other Ambulatory Visit: Payer: Self-pay | Admitting: Family Medicine

## 2016-04-21 NOTE — Telephone Encounter (Signed)
Routing to [employer].

## 2016-04-21 NOTE — Telephone Encounter (Signed)
Please advise refill for Lantus. 03/22/2016?

## 2016-04-21 NOTE — Telephone Encounter (Signed)
Pt states her insurance is not going to cover the new medication Dr. Sherrie Mustache wants her to take.  She states she it totally out of medication and needs to have L[facility] sent into [facility] in [place].

## 2016-04-21 NOTE — Telephone Encounter (Signed)
Where does this message need to be sent to? Its just floating in the system-aa

## 2016-04-24 NOTE — Telephone Encounter (Signed)
Per Dr. Sherrie Mustache note for 04/05/16 patient was discontinuing lantus solostar for [employer], which he sent refills on. Lantus solostar was discontinued.

## 2016-05-10 ENCOUNTER — Other Ambulatory Visit: Payer: Self-pay | Admitting: Family Medicine

## 2016-05-10 ENCOUNTER — Ambulatory Visit (INDEPENDENT_AMBULATORY_CARE_PROVIDER_SITE_OTHER): Payer: Medicaid Other | Admitting: Family Medicine

## 2016-05-10 ENCOUNTER — Encounter: Payer: Self-pay | Admitting: Family Medicine

## 2016-05-10 VITALS — BP 118/60 | HR 84 | Temp 98.1°F | Resp 16 | Ht 63.0 in | Wt 217.0 lb

## 2016-05-10 DIAGNOSIS — E785 Hyperlipidemia, unspecified: Secondary | ICD-10-CM

## 2016-05-10 DIAGNOSIS — M1990 Unspecified osteoarthritis, unspecified site: Secondary | ICD-10-CM | POA: Diagnosis not present

## 2016-05-10 DIAGNOSIS — E113299 Type 2 diabetes mellitus with mild nonproliferative diabetic retinopathy without macular edema, unspecified eye: Secondary | ICD-10-CM

## 2016-05-10 DIAGNOSIS — E139 Other specified diabetes mellitus without complications: Secondary | ICD-10-CM

## 2016-05-10 DIAGNOSIS — F329 Major depressive disorder, single episode, unspecified: Secondary | ICD-10-CM | POA: Diagnosis not present

## 2016-05-10 DIAGNOSIS — F32A Depression, unspecified: Secondary | ICD-10-CM

## 2016-05-10 LAB — POCT GLYCOSYLATED HEMOGLOBIN (HGB A1C)
Est. average glucose Bld gHb Est-mCnc: 301
HEMOGLOBIN A1C: 12.1

## 2016-05-10 MED ORDER — LANTUS SOLOSTAR 100 UNIT/ML ~~LOC~~ SOPN: 40.0000 [IU] | PEN_INJECTOR | Freq: Two times a day (BID) | SUBCUTANEOUS | 2 refills | Status: DC

## 2016-05-10 MED ORDER — MELOXICAM 15 MG PO TABS: ORAL_TABLET | ORAL | 1 refills | Status: DC

## 2016-05-10 MED ORDER — ROSUVASTATIN CALCIUM 5 MG PO TABS: 5.0000 mg | ORAL_TABLET | Freq: Every day | ORAL | 1 refills | Status: DC

## 2016-05-10 NOTE — Progress Notes (Signed)
Patient: Emily Collins Female    DOB: 1965-12-30   50 y.o.   MRN: [id number] Visit Date: 05/10/2016  Today's Provider: Mila Merryonald Maureena Dabbs, MD   Chief Complaint  Patient presents with  . Diabetes   Subjective:    HPI  Diabetes Mellitus Type II, Follow-up:   Lab Results  Component Value Date   HGBA1C 13.1 04/05/2016   HGBA1C 14.0 07/08/2015   HGBA1C 13.4 12/01/2014    Last seen for diabetes 6 weeks ago.  Management since then includes increased humalog, changed toujeo to lantus. toujeo was not covevered by insurance. She reports excellent compliance with treatment. She is having side effects.  Current symptoms include hypoglycemia  and have been stable. Home blood sugar records: fasting range: 74 this morning.  Most fastings under 100 and low 100s before meals.  Episodes of hypoglycemia? yes - 48   Current Insulin Regimen: 40 units Lantus BId and Humalog before meals per sliding scale.  Most Recent Eye Exam: no Weight trend: stable Prior visit with dietician: no Current diet: in general, a "healthy" diet   Current exercise: none  Pertinent Labs:    Component Value Date/Time   CHOL 247 (H) 04/05/2016 0916   TRIG 189 (H) 04/05/2016 0916   HDL 32 (L) 04/05/2016 0916   LDLCALC 177 (H) 04/05/2016 0916   CREATININE 0.60 04/05/2016 0916    Wt Readings from Last 3 Encounters:  05/10/16 217 lb (98.4 kg)  04/05/16 219 lb (99.3 kg)  07/08/15 212 lb (96.2 kg)    ------------------------------------------------------------------------   Lipid/Cholesterol, Follow-up:   Last seen for this6 weeks ago.  Management changes since that visit include increased atorvastatin to 40 mg. . Last Lipid Panel:    Component Value Date/Time   CHOL 247 (H) 04/05/2016 0916   TRIG 189 (H) 04/05/2016 0916   HDL 32 (L) 04/05/2016 0916   CHOLHDL 7.7 (H) 04/05/2016 0916   LDLCALC 177 (H) 04/05/2016 40980916    Risk factors for vascular disease include diabetes mellitus,  hypercholesterolemia and smoking  She reports poor compliance with treatment. She stopped atoravastatin due to side effects.  Current symptoms include muscle and bone pain had similar side effects in past from simvastatin. Weight trend: stable Prior visit with dietician: no Current diet: in general, a "healthy" diet   Current exercise: none  Wt Readings from Last 3 Encounters:  05/10/16 217 lb (98.4 kg)  04/05/16 219 lb (99.3 kg)  07/08/15 212 lb (96.2 kg)    -------------------------------------------------------------------     Allergies  Allergen Reactions  . Levaquin  [Levofloxacin In D5w]     GI upset  . Simvastatin     joint aches.  . Dilaudid  [Hydromorphone Hcl] Hives, Itching and Rash  . Penicillins Rash  . Tetracycline Rash   Current Meds  Medication Sig  . aspirin 81 MG tablet Take 81 mg by mouth daily.  . cetirizine (ZYRTEC ALLERGY) 10 MG tablet Take 1 tablet (10 mg total) by mouth daily.  . clonazePAM (KLONOPIN) 0.5 MG tablet Take 1 tablet (0.5 mg total) by mouth at bedtime.  Marland Kitchen. esomeprazole (NEXIUM) 20 MG capsule Take 1 capsule (20 mg total) by mouth daily at 12 noon.  Marland Kitchen. FLUoxetine (PROZAC) 40 MG capsule TAKE 1 CAPSULE BY MOUTH EVERY DAY  . fluticasone (FLONASE) 50 MCG/ACT nasal spray SHAKE WELL AND USE 2 SPRAYS IN EACH NOSTRIL DAILY  . furosemide (LASIX) 40 MG tablet TAKE 1 TABLET BY MOUTH AS NEEDED FOR SWELLING  .  insulin lispro (HUMALOG) 100 UNIT/ML injection Inject 0.4 mLs (40 Units total) into the skin 2 (two) times daily.  Marland Kitchen. LANTUS SOLOSTAR 100 UNIT/ML Solostar Pen Inject 40 Units as directed 2 (two) times daily.  . meloxicam (MOBIC) 15 MG tablet TAKE 1 TABLET(15 MG) BY MOUTH DAILY WITH FOOD AS NEEDED FOR ARTHRITIS OR PAIN  . montelukast (SINGULAIR) 10 MG tablet Take 1 tablet (10 mg total) by mouth at bedtime.    Review of Systems  Constitutional: Negative.   Cardiovascular: Positive for leg swelling.  Endocrine: Negative.   Musculoskeletal: Positive  for myalgias.    Social History  Substance Use Topics  . Smoking status: Current Every Day Smoker    Packs/day: 1.00    Years: 30.00    Types: Cigarettes    Start date: 04/06/1985  . Smokeless tobacco: Never Used  . Alcohol use No   Objective:   BP 118/60 (BP Location: Right Arm, Patient Position: Sitting, Cuff Size: Large)   Pulse 84   Temp 98.1 F (36.7 C) (Oral)   Resp 16   Ht 5\' 3"  (1.6 m)   Wt 217 lb (98.4 kg)   LMP 04/09/2016 (Approximate)   SpO2 99%   BMI 38.44 kg/m   Physical Exam  General Appearance:    Alert, cooperative, no distress, obese  Eyes:    PERRL, conjunctiva/corneas clear, EOM's intact       Lungs:     Clear to auscultation bilaterally, respirations unlabored  Heart:    Regular rate and rhythm  Neurologic:   Awake, alert, oriented x 3. No apparent focal neurological           defect.       Results for orders placed or performed in visit on 05/10/16  POCT glycosylated hemoglobin (Hb A1C)  Result Value Ref Range   Hemoglobin A1C 12.1    Est. average glucose Bld gHb Est-mCnc 301        Assessment & Plan:     1. Diabetes mellitus type 1.5,uncontrolled, managed as type 2 A1c improving and she states she feels much better, but A1c still far from goal She reports much better home blood sugar. Continue to monitor FSBS QID and continue current basal and SSI. Recheck A1c 2 months, consider endocrinology referral.  - POCT glycosylated hemoglobin (Hb A1C) - LANTUS SOLOSTAR 100 UNIT/ML Solostar Pen; Inject 40 Units into the skin 2 (two) times daily.  Dispense: 15 mL; Refill: 2  2. Depression Continue current dose fluoxetine  3. Mild nonproliferative diabetic retinopathy without macular edema associated with type 2 diabetes mellitus (HCC)   4. Hyperlipidemia Intolerant to atorvastatin and simvastatin. Try low dose rosuvastatin. Consider Livalo or Zetia.   5. Arthritis Needs refill meloxicam.  - meloxicam (MOBIC) 15 MG tablet; TAKE 1 TABLET(15 MG) BY  MOUTH DAILY WITH FOOD AS NEEDED FOR ARTHRITIS OR PAIN  Dispense: 90 tablet; Refill: 1  The entirety of the information documented in the History of Present Illness, Review of Systems and Physical Exam were personally obtained by me. Portions of this information were initially documented by Rondel BatonSulibeya Dimas, CMA and reviewed by me for thoroughness and accuracy.        Mila Merryonald Jazmen Lindenbaum, MD  [facility]

## 2016-07-11 ENCOUNTER — Ambulatory Visit: Payer: Medicaid Other | Admitting: Family Medicine

## 2016-07-14 ENCOUNTER — Other Ambulatory Visit: Payer: Self-pay | Admitting: Family Medicine

## 2016-07-14 DIAGNOSIS — E139 Other specified diabetes mellitus without complications: Secondary | ICD-10-CM

## 2016-08-04 ENCOUNTER — Other Ambulatory Visit: Payer: Self-pay | Admitting: Family Medicine

## 2016-08-04 DIAGNOSIS — M2550 Pain in unspecified joint: Secondary | ICD-10-CM

## 2016-08-25 ENCOUNTER — Other Ambulatory Visit: Payer: Self-pay | Admitting: Family Medicine

## 2016-08-25 DIAGNOSIS — E139 Other specified diabetes mellitus without complications: Secondary | ICD-10-CM

## 2016-08-25 NOTE — Telephone Encounter (Signed)
Patient is overdue for follow up office visit. Need to schedule before any additional refills.

## 2016-08-29 ENCOUNTER — Encounter: Payer: Self-pay | Admitting: Family Medicine

## 2016-08-29 ENCOUNTER — Ambulatory Visit (INDEPENDENT_AMBULATORY_CARE_PROVIDER_SITE_OTHER): Payer: Medicaid Other | Admitting: Family Medicine

## 2016-08-29 VITALS — BP 124/60 | HR 90 | Temp 98.0°F | Resp 16 | Ht 63.0 in | Wt 215.0 lb

## 2016-08-29 DIAGNOSIS — F32A Depression, unspecified: Secondary | ICD-10-CM

## 2016-08-29 DIAGNOSIS — F329 Major depressive disorder, single episode, unspecified: Secondary | ICD-10-CM | POA: Diagnosis not present

## 2016-08-29 DIAGNOSIS — J40 Bronchitis, not specified as acute or chronic: Secondary | ICD-10-CM

## 2016-08-29 DIAGNOSIS — N182 Chronic kidney disease, stage 2 (mild): Secondary | ICD-10-CM

## 2016-08-29 DIAGNOSIS — E785 Hyperlipidemia, unspecified: Secondary | ICD-10-CM

## 2016-08-29 DIAGNOSIS — Z23 Encounter for immunization: Secondary | ICD-10-CM

## 2016-08-29 DIAGNOSIS — E109 Type 1 diabetes mellitus without complications: Secondary | ICD-10-CM

## 2016-08-29 DIAGNOSIS — E139 Other specified diabetes mellitus without complications: Secondary | ICD-10-CM

## 2016-08-29 LAB — POCT GLYCOSYLATED HEMOGLOBIN (HGB A1C)
Est. average glucose Bld gHb Est-mCnc: 280
Hemoglobin A1C: 11.4

## 2016-08-29 MED ORDER — ALBUTEROL SULFATE HFA 108 (90 BASE) MCG/ACT IN AERS: 2.0000 | INHALATION_SPRAY | Freq: Four times a day (QID) | RESPIRATORY_TRACT | 2 refills | Status: DC | PRN

## 2016-08-29 MED ORDER — AZITHROMYCIN 250 MG PO TABS: ORAL_TABLET | ORAL | 0 refills | Status: AC

## 2016-08-29 NOTE — Progress Notes (Signed)
Patient: Emily Collins Female    DOB: 05/31/1966   50 y.o.   MRN: [id number] Visit Date: 08/29/2016  Today's Provider: Mila Merry, MD   Chief Complaint  Patient presents with  . Follow-up  . Diabetes   Subjective:    Patient has had cough and chest congestion since Thanksgiving. Patient stated that she has symptoms of sob and wheezing.     Depression Reports her husband had four strokes this year and is now completely disabled. She continues on fluoxetine and feels has helped her deal with increased stress and anxiety, and is tolerating well.   Arthritis Has been taking meloxicam which she has found very effective, but has been taking 2 a day at times.     Diabetes Mellitus Type II, Follow-up:   Lab Results  Component Value Date   HGBA1C 12.1 05/10/2016   HGBA1C 13.1 04/05/2016   HGBA1C 14.0 07/08/2015   Last seen for diabetes 4 months ago.  Management since then includes improving compliance with insulin regiment. C-peptide was checked which was undetectable.  She reports good compliance with treatment. She is not having side effects. none Current symptoms include none and have been unchanged. Home blood sugar records: fasting range: 114  Episodes of hypoglycemia? no   Current Insulin Regimen: 80 units qd Most Recent Eye Exam: due Weight trend: stable Prior visit with dietician: no Current diet: unhealthy  Current exercise: none  ----------------------------------------------------------------    Lipid/Cholesterol, Follow-up:   Last seen for this 4 months ago.   Management since that visit include trial of low dose rosuvastatin. She states she is tolerating it well without adverse effects.    Last Lipid Panel:    Component Value Date/Time   CHOL 247 (H) 04/05/2016 0916   TRIG 189 (H) 04/05/2016 0916   HDL 32 (L) 04/05/2016 0916   CHOLHDL 7.7 (H) 04/05/2016 0916   LDLCALC 177 (H) 04/05/2016 7829    She reports good compliance with  treatment. She is not having side effects. none  Wt Readings from Last 3 Encounters:  08/29/16 215 lb (97.5 kg)  05/10/16 217 lb (98.4 kg)  04/05/16 219 lb (99.3 kg)    ----------------------------------------------------------------    Allergies  Allergen Reactions  . Atorvastatin     Muscle and join pains.  Barbera Setters  [Levofloxacin In D5w]     GI upset  . Simvastatin     joint aches.  . Dilaudid  [Hydromorphone Hcl] Hives, Itching and Rash  . Penicillins Rash  . Tetracycline Rash     Current Outpatient Prescriptions:  .  aspirin 81 MG tablet, Take 81 mg by mouth daily., Disp: , Rfl:  .  cetirizine (ZYRTEC ALLERGY) 10 MG tablet, Take 1 tablet (10 mg total) by mouth daily., Disp: 90 tablet, Rfl: 1 .  clonazePAM (KLONOPIN) 0.5 MG tablet, Take 1 tablet (0.5 mg total) by mouth at bedtime., Disp: 30 tablet, Rfl: 5 .  esomeprazole (NEXIUM) 20 MG capsule, Take 1 capsule (20 mg total) by mouth daily at 12 noon., Disp: 90 capsule, Rfl: 1 .  FLUoxetine (PROZAC) 40 MG capsule, TAKE 1 CAPSULE BY MOUTH EVERY DAY, Disp: 90 capsule, Rfl: 1 .  fluticasone (FLONASE) 50 MCG/ACT nasal spray, SHAKE WELL AND USE 2 SPRAYS IN EACH NOSTRIL DAILY, Disp: 48 g, Rfl: 1 .  furosemide (LASIX) 40 MG tablet, TAKE 1 TABLET BY MOUTH AS NEEDED FOR SWELLING, Disp: 90 tablet, Rfl: 1 .  insulin lispro (HUMALOG) 100 UNIT/ML  injection, Inject 0.4 mLs (40 Units total) into the skin 2 (two) times daily., Disp: 20 mL, Rfl: 4 .  LANTUS SOLOSTAR 100 UNIT/ML Solostar Pen, ADMINISTER 40 UNITS UNDER THE SKIN TWICE DAILY, Disp: 15 mL, Rfl: 0 .  meloxicam (MOBIC) 15 MG tablet, TAKE 1 TABLET(15 MG) BY MOUTH DAILY WITH FOOD AS NEEDED FOR ARTHRITIS OR PAIN, Disp: 90 tablet, Rfl: 1 .  meloxicam (MOBIC) 15 MG tablet, TAKE 1 TABLET(15 MG) BY MOUTH DAILY WITH FOOD AS NEEDED FOR ARTHRITIS OR PAIN, Disp: 30 tablet, Rfl: 3 .  montelukast (SINGULAIR) 10 MG tablet, Take 1 tablet (10 mg total) by mouth at bedtime., Disp: 90 tablet, Rfl:  3 .  rosuvastatin (CRESTOR) 5 MG tablet, TAKE 1 TABLET(5 MG) BY MOUTH DAILY, Disp: 90 tablet, Rfl: 1  Review of Systems  Constitutional: Negative for appetite change, chills, fatigue and fever.  HENT: Positive for congestion.   Respiratory: Positive for cough, shortness of breath and wheezing. Negative for chest tightness.   Cardiovascular: Negative for chest pain and palpitations.  Gastrointestinal: Negative for abdominal pain, nausea and vomiting.  Neurological: Negative for dizziness and weakness.    Social History  Substance Use Topics  . Smoking status: Current Every Day Smoker    Packs/day: 1.00    Years: 30.00    Types: Cigarettes    Start date: 04/06/1985  . Smokeless tobacco: Never Used  . Alcohol use No   Objective:   BP 124/60 (BP Location: Right Arm, Patient Position: Sitting, Cuff Size: Large)   Pulse 90   Temp 98 F (36.7 C) (Oral)   Resp 16   Ht 5\' 3"  (1.6 m)   Wt 215 lb (97.5 kg)   BMI 38.09 kg/m    [facility] 08/29/2016  Decreased Interest 0  Down, Depressed, Hopeless 0  PHQ - 2 Score 0  Altered sleeping 2  Tired, decreased energy 2  Change in appetite 2  Feeling bad or failure about yourself  0  Trouble concentrating 2  Moving slowly or fidgety/restless 0  Suicidal thoughts 0  PHQ-9 Score 8  Difficult doing work/chores Somewhat difficult    Physical Exam  General Appearance:    Alert, cooperative, no distress  HENT:   bilateral TM normal without fluid or infection, throat normal without erythema or exudate, sinuses nontender and nasal mucosa congested  Eyes:    PERRL, conjunctiva/corneas clear, EOM's intact       Lungs:     Occasional expiratory wheeze, no rales, , respirations unlabored  Heart:    Regular rate and rhythm  Neurologic:   Awake, alert, oriented x 3. No apparent focal neurological           defect.         Results for orders placed or performed in visit on 08/29/16  POCT glycosylated hemoglobin (Hb A1C)    Result Value Ref Range   Hemoglobin A1C 11.4    Est. average glucose Bld gHb Est-mCnc 280        Assessment & Plan:     1. Diabetes mellitus type 1.5,uncontrolled, managed as type 2 C-peptide in August was below detection limits. Minimal improvement despite taking insulin consistently.   - POCT glycosylated hemoglobin (Hb A1C) - Ambulatory referral to Endocrinology  2. Bronchitis  - azithromycin (ZITHROMAX) 250 MG tablet; 2 by mouth today, then 1 daily for 4 days  Dispense: 6 tablet; Refill: 0 - albuterol (PROVENTIL HFA;VENTOLIN HFA) 108 (90 Base) MCG/ACT inhaler; Inhale 2 puffs  into the lungs every 6 (six) hours as needed for wheezing or shortness of breath.  Dispense: 1 Inhaler; Refill: 2  3. Stage 2 chronic kidney disease  - Renal function panel  4. Hyperlipidemia, unspecified hyperlipidemia type She is tolerating rosuvastatin well. She has only had cough drops, and coffee with cream today.  - Lipid panel - Renal function panel - Hepatic function panel  5. Depression/Situational stress Stable on current dose of fluoxetine.   Flu vaccine given today    The entirety of the information documented in the History of Present Illness, Review of Systems and Physical Exam were personally obtained by me. Portions of this information were initially documented by April M. Hyacinth MeekerMiller, CMA and reviewed by me for thoroughness and accuracy.    Mila Merryonald Bethsaida Siegenthaler, MD  [facility]

## 2016-08-30 LAB — RENAL FUNCTION PANEL
Albumin: 4.1 g/dL (ref 3.5–5.5)
BUN / CREAT RATIO: 24 — AB (ref 9–23)
BUN: 15 mg/dL (ref 6–24)
CO2: 25 mmol/L (ref 18–29)
CREATININE: 0.62 mg/dL (ref 0.57–1.00)
Calcium: 9.2 mg/dL (ref 8.7–10.2)
Chloride: 97 mmol/L (ref 96–106)
GFR, EST AFRICAN AMERICAN: 121 mL/min/{1.73_m2} (ref >59)
GFR, EST NON AFRICAN AMERICAN: 105 mL/min/{1.73_m2} (ref >59)
Glucose: 329 mg/dL — ABNORMAL HIGH (ref 65–99)
Phosphorus: 3.6 mg/dL (ref 2.5–4.5)
Potassium: 5.2 mmol/L (ref 3.5–5.2)
SODIUM: 137 mmol/L (ref 134–144)

## 2016-08-30 LAB — LIPID PANEL
CHOL/HDL RATIO: 4.9 ratio — AB (ref 0.0–4.4)
CHOLESTEROL TOTAL: 193 mg/dL (ref 100–199)
HDL: 39 mg/dL — ABNORMAL LOW (ref >39)
LDL CALC: 121 mg/dL — AB (ref 0–99)
TRIGLYCERIDES: 163 mg/dL — AB (ref 0–149)
VLDL CHOLESTEROL CAL: 33 mg/dL (ref 5–40)

## 2016-08-30 LAB — HEPATIC FUNCTION PANEL
ALK PHOS: 98 IU/L (ref 39–117)
ALT: 21 IU/L (ref 0–32)
AST: 18 IU/L (ref 0–40)
Bilirubin Total: <0.2 mg/dL (ref 0.0–1.2)
Bilirubin, Direct: 0.05 mg/dL (ref 0.00–0.40)
Total Protein: 6.9 g/dL (ref 6.0–8.5)

## 2016-08-31 ENCOUNTER — Other Ambulatory Visit: Payer: Self-pay | Admitting: Family Medicine

## 2016-08-31 DIAGNOSIS — E139 Other specified diabetes mellitus without complications: Secondary | ICD-10-CM

## 2016-09-08 ENCOUNTER — Telehealth: Payer: Self-pay

## 2016-09-08 NOTE — Telephone Encounter (Signed)
Can you please call Dr. Pricilla HandlerSolum's office and see what the deal is.  This doesn't sound right and she really need to see an endocrinology. Thanks.

## 2016-09-08 NOTE — Telephone Encounter (Signed)
Patient called saying that you referred her to Dr. Mickel BaasSolumn's office to manage her diabetes. She had to reschedule due to her husband having a stroke. She reports that the earliest that they were able to get her in was February. She reports that they called her back today and told her since she cancelled her appt on the same day it was scheduled, they will not be able to see her in February. Is there someone else that she can be referred to? Please advise. Thanks!

## 2016-09-11 NOTE — Telephone Encounter (Signed)
[facility]

## 2016-09-11 NOTE — Telephone Encounter (Signed)
Please review. Thanks!  

## 2016-09-11 NOTE — Telephone Encounter (Signed)
[facility].Pt was a no show on 08/17/15.Another appointment was scheduled for pt on 09/08/16.She could not tell me if pt cancelled or was a no show.Dr Tedd SiasSolum is refusing now to take her as a pt

## 2016-11-14 ENCOUNTER — Encounter: Payer: Self-pay | Admitting: Family Medicine

## 2016-11-14 ENCOUNTER — Ambulatory Visit (INDEPENDENT_AMBULATORY_CARE_PROVIDER_SITE_OTHER): Payer: Medicaid Other | Admitting: Family Medicine

## 2016-11-14 VITALS — BP 120/70 | HR 81 | Temp 98.0°F | Resp 16 | Wt 210.0 lb

## 2016-11-14 DIAGNOSIS — J329 Chronic sinusitis, unspecified: Secondary | ICD-10-CM | POA: Diagnosis not present

## 2016-11-14 DIAGNOSIS — J029 Acute pharyngitis, unspecified: Secondary | ICD-10-CM | POA: Diagnosis not present

## 2016-11-14 LAB — POCT RAPID STREP A (OFFICE): RAPID STREP A SCREEN: NEGATIVE

## 2016-11-14 MED ORDER — AMOXICILLIN 500 MG PO CAPS: 1000.0000 mg | ORAL_CAPSULE | Freq: Two times a day (BID) | ORAL | 0 refills | Status: AC

## 2016-11-14 NOTE — Progress Notes (Signed)
Patient: Emily Collins Female    DOB: May 20, 1966   51 y.o.   MRN: [id number] Visit Date: 11/14/2016  Today's Provider: Mila Merryonald Fisher, MD   Chief Complaint  Patient presents with  . Sore Throat   Subjective:    Sore Throat   This is a new problem. The current episode started yesterday. The problem has been gradually worsening. The pain is worse on the right side. There has been no fever. Associated symptoms include ear pain (right ear), headaches, a hoarse voice, a plugged ear sensation, neck pain (right side) and swollen glands (right side of neck). Pertinent negatives include no abdominal pain, congestion, coughing, diarrhea, drooling, shortness of breath, trouble swallowing or vomiting. She has had no exposure to strep or mono. She has tried NSAIDs for the symptoms. The treatment provided mild relief.       Allergies  Allergen Reactions  . Atorvastatin     Muscle and join pains.  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 Levaquin  [Levofloxacin In D5w]     GI upset  . Simvastatin     joint aches.  . Dilaudid  [Hydromorphone Hcl] Hives, Itching and Rash  . Penicillins Rash  . Tetracycline Rash     Current Outpatient Prescriptions:  .  albuterol (PROVENTIL HFA;VENTOLIN HFA) 108 (90 Base) MCG/ACT inhaler, Inhale 2 puffs into the lungs every 6 (six) hours as needed for wheezing or shortness of breath., Disp: 1 Inhaler, Rfl: 2 .  aspirin 81 MG tablet, Take 81 mg by mouth daily., Disp: , Rfl:  .  cetirizine (ZYRTEC ALLERGY) 10 MG tablet, Take 1 tablet (10 mg total) by mouth daily., Disp: 90 tablet, Rfl: 1 .  clonazePAM (KLONOPIN) 0.5 MG tablet, Take 1 tablet (0.5 mg total) by mouth at bedtime., Disp: 30 tablet, Rfl: 5 .  esomeprazole (NEXIUM) 20 MG capsule, Take 1 capsule (20 mg total) by mouth daily at 12 noon., Disp: 90 capsule, Rfl: 1 .  FLUoxetine (PROZAC) 40 MG capsule, TAKE 1 CAPSULE BY MOUTH EVERY DAY, Disp: 90 capsule, Rfl: 1 .  fluticasone (FLONASE) 50 MCG/ACT nasal spray, SHAKE WELL AND USE 2 SPRAYS  IN EACH NOSTRIL DAILY, Disp: 48 g, Rfl: 1 .  furosemide (LASIX) 40 MG tablet, TAKE 1 TABLET BY MOUTH AS NEEDED FOR SWELLING, Disp: 90 tablet, Rfl: 1 .  HUMALOG KWIKPEN 100 UNIT/ML KiwkPen, INJ UP TO 100 UNI Falman PER DAY AS PER MD INSTRUCTIONS, Disp: , Rfl: 12 .  LANTUS SOLOSTAR 100 UNIT/ML Solostar Pen, INJECT 40 UNITS UNDER THE SKIN TWICE DAILY, Disp: 15 mL, Rfl: 2 .  meloxicam (MOBIC) 15 MG tablet, TAKE 1 TABLET(15 MG) BY MOUTH DAILY WITH FOOD AS NEEDED FOR ARTHRITIS OR PAIN, Disp: 90 tablet, Rfl: 1 .  meloxicam (MOBIC) 15 MG tablet, TAKE 1 TABLET(15 MG) BY MOUTH DAILY WITH FOOD AS NEEDED FOR ARTHRITIS OR PAIN, Disp: 30 tablet, Rfl: 3 .  montelukast (SINGULAIR) 10 MG tablet, Take 1 tablet (10 mg total) by mouth at bedtime., Disp: 90 tablet, Rfl: 3 .  rosuvastatin (CRESTOR) 5 MG tablet, TAKE 1 TABLET(5 MG) BY MOUTH DAILY, Disp: 90 tablet, Rfl: 1  Review of Systems  Constitutional: Negative for appetite change, chills, fatigue and fever.  HENT: Positive for ear pain (right ear) and hoarse voice. Negative for congestion, drooling and trouble swallowing.   Respiratory: Negative for cough, chest tightness and shortness of breath.   Cardiovascular: Negative for chest pain and palpitations.  Gastrointestinal: Negative for abdominal pain, diarrhea, nausea and vomiting.  Musculoskeletal: Positive for neck pain (right side).  Neurological: Positive for headaches. Negative for dizziness and weakness.    Social History  Substance Use Topics  . Smoking status: Current Every Day Smoker    Packs/day: 1.00    Years: 30.00    Types: Cigarettes    Start date: 04/06/1985  . Smokeless tobacco: Never Used  . Alcohol use No   Objective:   BP 120/70 (BP Location: Right Arm, Patient Position: Sitting, Cuff Size: Large)   Pulse 81   Temp 98 F (36.7 C) (Oral)   Resp 16   Wt 210 lb (95.3 kg)   SpO2 97% Comment: room air  BMI 37.20 kg/m   Physical Exam  General Appearance:    Alert, cooperative, no  distress  HENT:   bilateral TM normal without fluid or infection, neck without nodes, pharynx erythematous without exudate, frontal  sinus tender and nasal mucosa congested  Eyes:    PERRL, conjunctiva/corneas clear, EOM's intact       Lungs:     Clear to auscultation bilaterally, respirations unlabored  Heart:    Regular rate and rhythm  Neurologic:   Awake, alert, oriented x 3. No apparent focal neurological           defect.       Results for orders placed or performed in visit on 11/14/16  POCT rapid strep A  Result Value Ref Range   Rapid Strep A Screen Negative Negative       Assessment & Plan:     1. Sore throat  - POCT rapid strep A  2. Sinusitis, unspecified chronicity, unspecified location  - amoxicillin (AMOXIL) 500 MG capsule; Take 2 capsules (1,000 mg total) by mouth 2 (two) times daily.  Dispense: 40 capsule; Refill: 0       Mila Merry, MD  [facility]

## 2016-11-14 NOTE — Patient Instructions (Signed)

## 2016-11-16 ENCOUNTER — Other Ambulatory Visit: Payer: Self-pay | Admitting: Family Medicine

## 2016-11-16 DIAGNOSIS — F419 Anxiety disorder, unspecified: Secondary | ICD-10-CM

## 2016-11-16 NOTE — Telephone Encounter (Signed)
Please call in clonazepam  

## 2016-11-16 NOTE — Telephone Encounter (Signed)
rx called in-aa 

## 2016-12-09 ENCOUNTER — Other Ambulatory Visit: Payer: Self-pay | Admitting: Family Medicine

## 2016-12-09 DIAGNOSIS — E139 Other specified diabetes mellitus without complications: Secondary | ICD-10-CM

## 2016-12-27 ENCOUNTER — Other Ambulatory Visit: Payer: Self-pay | Admitting: Family Medicine

## 2016-12-27 DIAGNOSIS — J309 Allergic rhinitis, unspecified: Secondary | ICD-10-CM

## 2016-12-27 NOTE — Telephone Encounter (Signed)
Refill request for cetirizine 10 mg qd Last filled by MD on- 04/07/2016 #90 x1 refill Last Appt: 09/26/2016 Next Appt: none Please advise refill?

## 2017-01-24 ENCOUNTER — Other Ambulatory Visit: Payer: Self-pay | Admitting: Family Medicine

## 2017-01-24 DIAGNOSIS — E139 Other specified diabetes mellitus without complications: Secondary | ICD-10-CM

## 2017-01-24 DIAGNOSIS — K219 Gastro-esophageal reflux disease without esophagitis: Secondary | ICD-10-CM

## 2017-01-24 NOTE — Telephone Encounter (Signed)
Please advise patient she needs to contact her endocrinology for insulin refill.

## 2017-01-27 ENCOUNTER — Other Ambulatory Visit: Payer: Self-pay | Admitting: Family Medicine

## 2017-01-27 DIAGNOSIS — E139 Other specified diabetes mellitus without complications: Secondary | ICD-10-CM

## 2017-01-31 ENCOUNTER — Ambulatory Visit (INDEPENDENT_AMBULATORY_CARE_PROVIDER_SITE_OTHER): Payer: Medicaid Other | Admitting: Family Medicine

## 2017-01-31 ENCOUNTER — Encounter: Payer: Self-pay | Admitting: Family Medicine

## 2017-01-31 VITALS — BP 104/54 | HR 84 | Temp 98.3°F | Resp 16 | Wt 213.0 lb

## 2017-01-31 DIAGNOSIS — K219 Gastro-esophageal reflux disease without esophagitis: Secondary | ICD-10-CM | POA: Diagnosis not present

## 2017-01-31 DIAGNOSIS — M2550 Pain in unspecified joint: Secondary | ICD-10-CM | POA: Diagnosis not present

## 2017-01-31 MED ORDER — ESOMEPRAZOLE MAGNESIUM 40 MG PO CPDR: 40.0000 mg | DELAYED_RELEASE_CAPSULE | Freq: Every day | ORAL | 4 refills | Status: DC

## 2017-01-31 MED ORDER — PREDNISONE 10 MG PO TABS: ORAL_TABLET | ORAL | 0 refills | Status: AC

## 2017-01-31 NOTE — Patient Instructions (Addendum)
   [facility]ost[facility] as soon as possible to schedule diabetes follow up

## 2017-01-31 NOTE — Progress Notes (Signed)
Patient: Emily Collins Female    DOB: August 06, 1966   51 y.o.   MRN: [id number] Visit Date: 01/31/2017  Today's Provider: Lelon Huh, MD   Chief Complaint  Patient presents with  . Joint Pain   Subjective:     Patient states that she has been having joint pain for 2 weeks. Pain in elbows, knees, and low back. Patient has been using otc icey hot and pain patches with no relief. Patient also states that she has knots on her knuckles that have occurred over several months.   she states that Dr. Tamala Julian referred to a rheumatologist several years ago and was diagnosed with psoriatic arthritis, but has not required any treatment.   She also was referred to [facility] in January, but has not returned for follow up.      Allergies  Allergen Reactions  . Atorvastatin     Muscle and join pains.  [place]  [Levofloxacin In D5w]     GI upset  . Simvastatin     joint aches.  . Dilaudid  [Hydromorphone Hcl] Hives, Itching and Rash  . Tetracycline Rash     Current Outpatient Prescriptions:  .  albuterol (PROVENTIL HFA;VENTOLIN HFA) 108 (90 Base) MCG/ACT inhaler, Inhale 2 puffs into the lungs every 6 (six) hours as needed for wheezing or shortness of breath., Disp: 1 Inhaler, Rfl: 2 .  aspirin 81 MG tablet, Take 81 mg by mouth daily., Disp: , Rfl:  .  cetirizine (ZYRTEC) 10 MG tablet, TAKE 1 TABLET(10 MG) BY MOUTH DAILY, Disp: 90 tablet, Rfl: 0 .  clonazePAM (KLONOPIN) 0.5 MG tablet, TAKE 1 TABLET BY MOUTH EVERY NIGHT AT BEDTIME, Disp: 30 tablet, Rfl: 5 .  FLUoxetine (PROZAC) 40 MG capsule, TAKE 1 CAPSULE BY MOUTH EVERY DAY, Disp: 90 capsule, Rfl: 1 .  fluticasone (FLONASE) 50 MCG/ACT nasal spray, SHAKE WELL AND USE 2 SPRAYS IN EACH NOSTRIL DAILY, Disp: 48 g, Rfl: 1 .  furosemide (LASIX) 40 MG tablet, TAKE 1 TABLET BY MOUTH AS NEEDED FOR SWELLING, Disp: 90 tablet, Rfl: 1 .  HUMALOG KWIKPEN 100 UNIT/ML KiwkPen, INJ UP TO 100 UNI [place] PER DAY AS PER MD INSTRUCTIONS, Disp: ,  Rfl: 12 .  LANTUS SOLOSTAR 100 UNIT/ML Solostar Pen, INJECT 40 UNITS UNDER THE SKIN TWICE DAILY, Disp: 15 mL, Rfl: 1 .  meloxicam (MOBIC) 15 MG tablet, TAKE 1 TABLET(15 MG) BY MOUTH DAILY WITH FOOD AS NEEDED FOR ARTHRITIS OR PAIN, Disp: 90 tablet, Rfl: 1 .  montelukast (SINGULAIR) 10 MG tablet, Take 1 tablet (10 mg total) by mouth at bedtime., Disp: 90 tablet, Rfl: 3 .  NEXIUM 20 MG capsule, TAKE ONE CAPSULE BY MOUTH DAILY AT 12:00 NOON, Disp: 90 capsule, Rfl: 1 .  rosuvastatin (CRESTOR) 5 MG tablet, TAKE 1 TABLET(5 MG) BY MOUTH DAILY, Disp: 90 tablet, Rfl: 1  Review of Systems  Constitutional: Negative for appetite change, chills, fatigue and fever.  Respiratory: Negative for chest tightness and shortness of breath.   Cardiovascular: Negative for chest pain and palpitations.  Gastrointestinal: Negative for abdominal pain, nausea and vomiting.  Neurological: Negative for dizziness and weakness.    Social History  Substance Use Topics  . Smoking status: Current Every Day Smoker    Packs/day: 1.00    Years: 30.00    Types: Cigarettes    Start date: 04/06/1985  . Smokeless tobacco: Never Used  . Alcohol use No   Objective:   BP (!) 104/54 (BP  Location: Right Arm, Patient Position: Sitting, Cuff Size: Large)   Pulse 84   Temp 98.3 F (36.8 C) (Oral)   Resp 16   Wt 213 lb (96.6 kg)   SpO2 98%   BMI 37.73 kg/m     Physical Exam   General Appearance:    Alert, cooperative, no distress  Eyes:    PERRL, conjunctiva/corneas clear, EOM's intact       Lungs:     Clear to auscultation bilaterally, respirations unlabored  Heart:    Regular rate and rhythm  MS:   Mild swelling of DIPS, diffuse tenderness of joints of hands, wrist, elbows and knees. .           Assessment & Plan:     1. Arthralgia, unspecified joint History of psoriatic arthritis by patient report. Consider referral for new rheumatology evaluation after reviewing labs and trial of prednisone.  - ANA w/Reflex if  Positive - Sed Rate (ESR) - Rheumatoid Factor - predniSONE (DELTASONE) 10 MG tablet; 6 tablets for 1 day, then 5 for 1 day, then 4 for 1 day, then 3 for 1 day, then 2 for 1 day then 1 for 1 day.  Dispense: 21 tablet; Refill: 0  2. Gastroesophageal reflux disease without esophagitis She states she is having more heartburn and reflux symptoms lately. Will double dose of esomeprazole (NEXIUM) 40 MG capsule; Take 1 capsule (40 mg total) by mouth daily.  Dispense: 90 capsule; Refill: Ramsey, MD  [facility]

## 2017-02-01 LAB — SEDIMENTATION RATE: Sed Rate: 23 mm/hr (ref 0–40)

## 2017-02-01 LAB — RHEUMATOID FACTOR

## 2017-02-01 LAB — ANA W/REFLEX IF POSITIVE: ANA: NEGATIVE

## 2017-02-12 ENCOUNTER — Other Ambulatory Visit: Payer: Self-pay | Admitting: Family Medicine

## 2017-02-12 DIAGNOSIS — F329 Major depressive disorder, single episode, unspecified: Secondary | ICD-10-CM

## 2017-02-12 DIAGNOSIS — F32A Depression, unspecified: Secondary | ICD-10-CM

## 2017-02-12 DIAGNOSIS — J40 Bronchitis, not specified as acute or chronic: Secondary | ICD-10-CM

## 2017-03-14 ENCOUNTER — Other Ambulatory Visit: Payer: Self-pay | Admitting: Family Medicine

## 2017-03-14 DIAGNOSIS — M2550 Pain in unspecified joint: Secondary | ICD-10-CM

## 2017-03-27 ENCOUNTER — Other Ambulatory Visit: Payer: Self-pay | Admitting: Emergency Medicine

## 2017-03-27 DIAGNOSIS — E139 Other specified diabetes mellitus without complications: Secondary | ICD-10-CM

## 2017-03-27 NOTE — Telephone Encounter (Signed)
Please review-aa 

## 2017-03-27 NOTE — Telephone Encounter (Signed)
Pt is completely out of medication. She will need this sent in today. Please advise. (She has an appt with Endo but she will run out before she gets in with them.)  [facility]

## 2017-03-27 NOTE — Telephone Encounter (Signed)
Patient needs to contact her endocrinologist for insulin refills.

## 2017-03-29 ENCOUNTER — Other Ambulatory Visit: Payer: Self-pay | Admitting: Family Medicine

## 2017-03-29 DIAGNOSIS — E139 Other specified diabetes mellitus without complications: Secondary | ICD-10-CM

## 2017-03-29 NOTE — Telephone Encounter (Signed)
Pt advised.

## 2017-04-02 ENCOUNTER — Other Ambulatory Visit: Payer: Self-pay | Admitting: Family Medicine

## 2017-04-02 MED ORDER — FLUTICASONE PROPIONATE 50 MCG/ACT NA SUSP: NASAL | 0 refills | Status: DC

## 2017-04-02 NOTE — Telephone Encounter (Signed)
Patient needs to contact her endocrinologist for insulin prescription.

## 2017-04-02 NOTE — Telephone Encounter (Addendum)
[facility] pharmacy faxed a request on the following medications. Thanks CC   fluticasone (FLONASE) 50 MCG/ACT nasal spray  >Shake liquid and use 2 sprays in each nostril daily.  LANTUS SOLOSTAR 100 UNIT/ML Solostar Pen  >Administer 40 units under the skin twice daily.

## 2017-04-02 NOTE — Telephone Encounter (Signed)
Please review. Thanks!  

## 2017-04-03 NOTE — Telephone Encounter (Signed)
Pharmacy notified. Pharmacist states request was sent in error.

## 2017-05-16 ENCOUNTER — Other Ambulatory Visit: Payer: Self-pay | Admitting: Family Medicine

## 2017-05-16 ENCOUNTER — Other Ambulatory Visit: Payer: Self-pay | Admitting: Physician Assistant

## 2017-05-16 DIAGNOSIS — M2550 Pain in unspecified joint: Secondary | ICD-10-CM

## 2017-05-16 DIAGNOSIS — J309 Allergic rhinitis, unspecified: Secondary | ICD-10-CM

## 2017-06-14 ENCOUNTER — Other Ambulatory Visit: Payer: Self-pay | Admitting: Family Medicine

## 2017-06-14 DIAGNOSIS — F419 Anxiety disorder, unspecified: Secondary | ICD-10-CM

## 2017-06-14 DIAGNOSIS — M2550 Pain in unspecified joint: Secondary | ICD-10-CM

## 2017-06-15 ENCOUNTER — Other Ambulatory Visit: Payer: Self-pay | Admitting: Family Medicine

## 2017-06-15 DIAGNOSIS — K219 Gastro-esophageal reflux disease without esophagitis: Secondary | ICD-10-CM

## 2017-06-15 NOTE — Telephone Encounter (Signed)
rx called in-Emily Collins Emily Collins Emily Collins, RMA  

## 2017-06-15 NOTE — Telephone Encounter (Signed)
Please call in clonazepam  

## 2017-08-21 ENCOUNTER — Other Ambulatory Visit: Payer: Self-pay | Admitting: Family Medicine

## 2017-08-21 DIAGNOSIS — K219 Gastro-esophageal reflux disease without esophagitis: Secondary | ICD-10-CM

## 2017-10-25 ENCOUNTER — Other Ambulatory Visit: Payer: Self-pay | Admitting: Family Medicine

## 2018-02-04 DIAGNOSIS — F411 Generalized anxiety disorder: Secondary | ICD-10-CM | POA: Insufficient documentation

## 2018-02-04 DIAGNOSIS — J4520 Mild intermittent asthma, uncomplicated: Secondary | ICD-10-CM | POA: Insufficient documentation

## 2018-02-04 DIAGNOSIS — E1065 Type 1 diabetes mellitus with hyperglycemia: Secondary | ICD-10-CM | POA: Insufficient documentation

## 2018-05-25 ENCOUNTER — Other Ambulatory Visit: Payer: Self-pay | Admitting: Family Medicine

## 2018-07-22 ENCOUNTER — Other Ambulatory Visit: Payer: Self-pay | Admitting: Family Medicine

## 2018-07-22 DIAGNOSIS — K219 Gastro-esophageal reflux disease without esophagitis: Secondary | ICD-10-CM

## 2018-08-05 ENCOUNTER — Encounter (INDEPENDENT_AMBULATORY_CARE_PROVIDER_SITE_OTHER): Payer: Self-pay

## 2018-08-05 ENCOUNTER — Ambulatory Visit: Payer: Self-pay | Admitting: Pharmacy Technician

## 2018-08-05 DIAGNOSIS — Z79899 Other long term (current) drug therapy: Secondary | ICD-10-CM

## 2018-08-09 ENCOUNTER — Ambulatory Visit: Payer: Self-pay

## 2018-08-12 ENCOUNTER — Ambulatory Visit: Payer: Medicaid Other | Admitting: Pharmacist

## 2018-08-12 ENCOUNTER — Encounter: Payer: Self-pay | Admitting: Pharmacist

## 2018-08-12 VITALS — BP 130/70 | Ht 63.0 in | Wt 218.0 lb

## 2018-08-12 DIAGNOSIS — Z79899 Other long term (current) drug therapy: Secondary | ICD-10-CM

## 2018-08-12 NOTE — Progress Notes (Addendum)
[facility] Management [facility] Visit Note  Patient: Emily Collins MRN: [id number] Date of Birth: 15-Jul-1966 PCP: Letta Median, MD   [facility] 52 y.o. female presents for initial medication therapy management visit today.  BP 130/70 (BP Location: Right Arm, Patient Position: Sitting, Cuff Size: Large)   Ht 5' 3"  (1.6 m)   Wt 218 lb (98.9 kg)   BMI 38.62 kg/m   Patient Information   Past Medical History:  Diagnosis Date  . Anxiety   . Arthritis   . Depression   . Diabetes mellitus without complication ([place])   . GERD (gastroesophageal reflux disease)   . History of chicken pox 04/07/2015   DID have Chicken Pox.    . Hyperlipidemia   . Venous (peripheral) insufficiency       Past Surgical History:  Procedure Laterality Date  . APPENDECTOMY  1979  . BILATERAL CARPAL TUNNEL RELEASE Bilateral    Dr. Rudene Christians  . CESAREAN SECTION  2003  . LAPAROSCOPIC LYSIS OF ADHESIONS    . SHOULDER SURGERY Right 08/2009   Dr. Rudene Christians  . TUBAL LIGATION       Family History  Problem Relation Age of Onset  . Hypertension Mother   . Diabetes Mother   . Hyperlipidemia Mother   . Arthritis Mother   . Heart disease Father   . Kidney disease Father     New Diagnoses (since last visit):   Family Support: Good; mother lives next door and has a good relationship with children as well  Lifestyle Diet: Breakfast: coffee - sometimes has toast with it but usually doesn't eat breakfast Lunch: usually snacks - half of [place] sandwich Dinner: chicken/pork chop/hamburger, mashed potatoes/mac n cheese/corn, and vegetables Drinks: flavored water, soda sometimes            Social History   Substance and Sexual Activity  Alcohol Use Yes  . Alcohol/week: 1.0 - 2.0 standard drinks  . Types: 1 - 2 Glasses of wine per week   Comment: Socially      Social History   Tobacco Use  Smoking Status Former Smoker  . Packs/day: 1.00  . Years: 30.00  . Pack years: 30.00  . Types:  Cigarettes  . Start date: 04/06/1985  . Last attempt to quit: 09/25/2017  . Years since quitting: 0.8  Smokeless Tobacco Never Used      Health Maintenance  Topic Date Due  . FOOT EXAM  06/14/1975  . OPHTHALMOLOGY EXAM  06/14/1975  . HIV Screening  06/13/1980  . PAP SMEAR  01/11/2013  . MAMMOGRAM  06/14/2015  . COLONOSCOPY  06/14/2015  . HEMOGLOBIN A1C  02/27/2017  . URINE MICROALBUMIN  04/05/2017  . INFLUENZA VACCINE  04/25/2018  . TETANUS/TDAP  08/06/2021  . PNEUMOCOCCAL POLYSACCHARIDE VACCINE AGE 40-64 HIGH RISK  Completed   Outpatient Encounter Medications as of 08/12/2018  Medication Sig  . aspirin 81 MG tablet Take 81 mg by mouth daily.  . cetirizine (ZYRTEC) 10 MG tablet TAKE 1 TABLET(10 MG) BY MOUTH DAILY  . clonazePAM (KLONOPIN) 0.5 MG tablet TAKE 1 TABLET BY MOUTH EVERY NIGHT AT BEDTIME  . esomeprazole (NEXIUM) 20 MG capsule TAKE ONE CAPSULE BY MOUTH DAILY AT 12:00 NOON  . FLUoxetine (PROZAC) 40 MG capsule TAKE 1 CAPSULE BY MOUTH EVERY DAY  . fluticasone (FLONASE) 50 MCG/ACT nasal spray SHAKE WELL AND USE 2 SPRAYS IN EACH NOSTRIL DAILY  . furosemide (LASIX) 40 MG tablet TAKE 1 TABLET BY MOUTH AS NEEDED FOR SWELLING  .  gabapentin (NEURONTIN) 600 MG tablet Take 600 mg by mouth at bedtime.  Marland Kitchen HUMALOG KWIKPEN 100 UNIT/ML KiwkPen 5 Units 3 (three) times daily with meals. INJ UP TO 100 UNI [place] PER DAY AS PER MD INSTRUCTIONS  . hydrOXYzine (ATARAX/VISTARIL) 10 MG tablet Take 10 mg by mouth 3 (three) times daily as needed.  Marland Kitchen LANTUS SOLOSTAR 100 UNIT/ML Solostar Pen INJECT 40 UNITS UNDER THE SKIN TWICE DAILY  . meloxicam (MOBIC) 15 MG tablet TAKE 1 TABLET(15 MG) BY MOUTH DAILY WITH FOOD AS NEEDED FOR ARTHRITIS PAIN  . montelukast (SINGULAIR) 10 MG tablet TAKE 1 TABLET BY MOUTH AT BEDTIME  . PROAIR HFA 108 (90 Base) MCG/ACT inhaler INHALE 2 PUFFS INTO THE LUNGS EVERY 6 HOURS AS NEEDED FOR WHEEZING OR SHORTNESS OF BREATH  . rosuvastatin (CRESTOR) 5 MG tablet TAKE 1 TABLET(5 MG) BY  MOUTH DAILY   No facility-administered encounter medications on file as of 08/12/2018.     Health Maintenance/Date Completed  Last ED visit: several years Last Visit to PCP: 08/12/18 Next Visit to PCP: 09/09/18 Specialist Visit: n/a Dental Exam: March 2019 Eye Exam: March 2019 Prostate Exam: n/a Pelvic/PAP Exam: 09/09/18 Mammogram: 09/09/18 DEXA: n/a Colonoscopy: no Flu Vaccine: no - hoping to get next month at PCP visit Pneumonia Vaccine: a few years ago  Assessment and Plan:  Adherence/Compliance: Pt reports no missed doses. Uses a pill organizer weekly. Takes care of herself and her husband's medications. Pt is very well educated on her medications.  DM: lantus, humalog, gabapentin 677m Checks BG 3-4 times a day. This morning was 290. Usually ranges from 80-160 in the morning. Pt states BG has never been well controlled. Pt states last A1c was around 14%. Pt states she has good relationship with her new PCP (Dr. BRebeca Alert and is going to be working with her to better manage her diabetes. Discussed starting a blood glucose log to help track her ranges throughout the day. Takes gabapentin for diabetic neuropathy; dose changed from 303mtwice daily to 60012mt bedtime.   Depression/Anxiety: fluoxetine 27m4mydroxyzine 10mg24mfeels well managed on fluoxetine. Hydroxyzine is new to patient to help manage her anxiety. 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  GERD: omeprazole 20mg 22mtates well managed.  Hyperlipidemia: atorvastatin 20mg L60mlabs were in 2017. Pt had labs drawn today at PCP visit and states she will discuss with Dr. Bender Rebeca Alertt visit in December.  Return to clinic in one year for annual MTM.  [facility]Hinton DyerD [facility]   Cosigned: Keri K.Cleopatra CedarsoDicky DoeD [facility]  Coordinator [phone]

## 2018-08-13 ENCOUNTER — Other Ambulatory Visit: Payer: Self-pay

## 2018-08-13 NOTE — Progress Notes (Signed)
Completed [facility] application and contract.  Patient agreed to all terms of the [facility] contract.    Patient approved to receive medication assistance at [facility] as long as eligibility criteria continues to be met.    Provided patient with community resource material based on her particular needs.    Lantus Prescription Application completed with patient.  Forwarded to Dr. Rutherford Guys for signature.  Upon receipt of signed application from provider, Lantus Prescription Application will be submitted to [employer].  [facility]

## 2018-10-07 ENCOUNTER — Emergency Department: Payer: Self-pay

## 2018-10-07 ENCOUNTER — Emergency Department
Admission: EM | Admit: 2018-10-07 | Discharge: 2018-10-07 | Disposition: A | Discharge status: Home / Self Care | Payer: Self-pay | Attending: Student in an Organized Health Care Education/Training Program | Admitting: Student in an Organized Health Care Education/Training Program

## 2018-10-07 ENCOUNTER — Encounter: Payer: Self-pay | Admitting: Emergency Medicine

## 2018-10-07 DIAGNOSIS — N12 Tubulo-interstitial nephritis, not specified as acute or chronic: Secondary | ICD-10-CM | POA: Insufficient documentation

## 2018-10-07 DIAGNOSIS — E1122 Type 2 diabetes mellitus with diabetic chronic kidney disease: Secondary | ICD-10-CM | POA: Insufficient documentation

## 2018-10-07 DIAGNOSIS — Z7982 Long term (current) use of aspirin: Secondary | ICD-10-CM | POA: Insufficient documentation

## 2018-10-07 DIAGNOSIS — Z794 Long term (current) use of insulin: Secondary | ICD-10-CM | POA: Insufficient documentation

## 2018-10-07 DIAGNOSIS — Z87891 Personal history of nicotine dependence: Secondary | ICD-10-CM | POA: Insufficient documentation

## 2018-10-07 DIAGNOSIS — N189 Chronic kidney disease, unspecified: Secondary | ICD-10-CM | POA: Insufficient documentation

## 2018-10-07 LAB — CBC WITH DIFFERENTIAL/PLATELET
Abs Immature Granulocytes: 0.02 10*3/uL (ref 0.00–0.07)
Basophils Absolute: 0 10*3/uL (ref 0.0–0.1)
Basophils Relative: 1 %
Eosinophils Absolute: 0 10*3/uL (ref 0.0–0.5)
Eosinophils Relative: 0 %
HCT: 38.7 % (ref 36.0–46.0)
Hemoglobin: 12 g/dL (ref 12.0–15.0)
Immature Granulocytes: 0 %
LYMPHS PCT: 7 %
Lymphs Abs: 0.4 10*3/uL — ABNORMAL LOW (ref 0.7–4.0)
MCH: 26.8 pg (ref 26.0–34.0)
MCHC: 31 g/dL (ref 30.0–36.0)
MCV: 86.6 fL (ref 80.0–100.0)
MONO ABS: 0 10*3/uL — AB (ref 0.1–1.0)
Monocytes Relative: 0 %
Neutro Abs: 5.3 10*3/uL (ref 1.7–7.7)
Neutrophils Relative %: 92 %
Platelets: 331 10*3/uL (ref 150–400)
RBC: 4.47 MIL/uL (ref 3.87–5.11)
RDW: 14 % (ref 11.5–15.5)
WBC: 5.7 10*3/uL (ref 4.0–10.5)
nRBC: 0 % (ref 0.0–0.2)

## 2018-10-07 LAB — COMPREHENSIVE METABOLIC PANEL
ALBUMIN: 4.1 g/dL (ref 3.5–5.0)
ALT: 27 U/L (ref 0–44)
AST: 39 U/L (ref 15–41)
Alkaline Phosphatase: 173 U/L — ABNORMAL HIGH (ref 38–126)
Anion gap: 12 (ref 5–15)
BUN: 19 mg/dL (ref 6–20)
CHLORIDE: 98 mmol/L (ref 98–111)
CO2: 21 mmol/L — ABNORMAL LOW (ref 22–32)
Calcium: 8.5 mg/dL — ABNORMAL LOW (ref 8.9–10.3)
Creatinine, Ser: 1.45 mg/dL — ABNORMAL HIGH (ref 0.44–1.00)
GFR calc Af Amer: 48 mL/min — ABNORMAL LOW (ref >60)
GFR calc non Af Amer: 41 mL/min — ABNORMAL LOW (ref >60)
Glucose, Bld: 485 mg/dL — ABNORMAL HIGH (ref 70–99)
POTASSIUM: 4.1 mmol/L (ref 3.5–5.1)
Sodium: 131 mmol/L — ABNORMAL LOW (ref 135–145)
Total Bilirubin: 0.5 mg/dL (ref 0.3–1.2)
Total Protein: 7.9 g/dL (ref 6.5–8.1)

## 2018-10-07 LAB — URINALYSIS, COMPLETE (UACMP) WITH MICROSCOPIC
Bilirubin Urine: NEGATIVE
Glucose, UA: >=500 mg/dL — AB
Ketones, ur: NEGATIVE mg/dL
Nitrite: NEGATIVE
Protein, ur: 30 mg/dL — AB
Specific Gravity, Urine: 1.015 (ref 1.005–1.030)
pH: 5 (ref 5.0–8.0)

## 2018-10-07 LAB — POCT PREGNANCY, URINE: Preg Test, Ur: NEGATIVE

## 2018-10-07 LAB — LIPASE, BLOOD: Lipase: 22 U/L (ref 11–51)

## 2018-10-07 LAB — GLUCOSE, CAPILLARY: GLUCOSE-CAPILLARY: 488 mg/dL — AB (ref 70–99)

## 2018-10-07 MED ORDER — CEPHALEXIN 500 MG PO CAPS: 500.0000 mg | ORAL_CAPSULE | Freq: Three times a day (TID) | ORAL | 0 refills | Status: AC

## 2018-10-07 MED ORDER — FENTANYL CITRATE (PF) 100 MCG/2ML IJ SOLN
50.0000 ug | Freq: Once | INTRAMUSCULAR | Status: AC
  Administered 2018-10-07: 50 ug via INTRAVENOUS
  Filled 2018-10-07: qty 2

## 2018-10-07 MED ORDER — SODIUM CHLORIDE 0.9 % IV SOLN
1.0000 g | Freq: Once | INTRAVENOUS | Status: AC
  Administered 2018-10-07: 1 g via INTRAVENOUS
  Filled 2018-10-07: qty 10

## 2018-10-07 MED ORDER — KETOROLAC TROMETHAMINE 30 MG/ML IJ SOLN
30.0000 mg | Freq: Once | INTRAMUSCULAR | Status: DC
  Filled 2018-10-07: qty 1

## 2018-10-07 MED ORDER — ONDANSETRON HCL 4 MG/2ML IJ SOLN
4.0000 mg | Freq: Once | INTRAMUSCULAR | Status: AC
  Administered 2018-10-07: 4 mg via INTRAVENOUS
  Filled 2018-10-07: qty 2

## 2018-10-07 MED ORDER — KETOROLAC TROMETHAMINE 30 MG/ML IJ SOLN
15.0000 mg | Freq: Once | INTRAMUSCULAR | Status: AC
  Administered 2018-10-07: 15 mg via INTRAVENOUS

## 2018-10-07 MED ORDER — FENTANYL CITRATE (PF) 100 MCG/2ML IJ SOLN
100.0000 ug | Freq: Once | INTRAMUSCULAR | Status: AC
  Administered 2018-10-07: 100 ug via INTRAVENOUS
  Filled 2018-10-07: qty 2

## 2018-10-07 MED ORDER — SODIUM CHLORIDE 0.9 % IV BOLUS
1000.0000 mL | Freq: Once | INTRAVENOUS | Status: AC
  Administered 2018-10-07: 1000 mL via INTRAVENOUS

## 2018-10-07 MED ORDER — HYDROCODONE-ACETAMINOPHEN 5-325 MG PO TABS: 1.0000 | ORAL_TABLET | Freq: Four times a day (QID) | ORAL | 0 refills | Status: AC | PRN

## 2018-10-07 MED ORDER — OXYCODONE-ACETAMINOPHEN 5-325 MG PO TABS
2.0000 | ORAL_TABLET | Freq: Once | ORAL | Status: AC
  Administered 2018-10-07: 2 via ORAL
  Filled 2018-10-07: qty 2

## 2018-10-07 NOTE — ED Triage Notes (Signed)
Pt reports sudden onset of left sided flank pain that started about 2 hours ago and nausea. Pt states the pain radiates down and around into her groin.

## 2018-10-07 NOTE — Discharge Instructions (Signed)
Please follow up with your primary care provider in about 2 weeks or sooner if not improving.  Take your insulin and sliding scale as prescribed.  Return to the emergency department for any symptom of concern if you are unable to see your primary care provider.

## 2018-10-07 NOTE — ED Notes (Signed)
Patient transported to CT 

## 2018-10-07 NOTE — ED Provider Notes (Signed)
[facility] Emergency Department Provider Note  ____________________________________________   First MD Initiated Contact with Patient 10/07/18 1947     (approximate)  I have reviewed the triage vital signs and the nursing notes.   HISTORY  Chief Complaint Flank Pain and Nausea   HPI Emily Collins is a 53 y.o. female who presents to the emergency department for treatment and evaluation of flank pain that started about 4 hours ago with associated nausea and vomiting. She states that she has been sitting with her mother that is in rehab after having hip surgery. Today, she experienced sudden onset of left flank pain with radiation into the left groin. She has dysuria and pressure in the lower abdomen.    Past Medical History:  Diagnosis Date  . Anxiety   . Arthritis   . Depression   . Diabetes mellitus without complication (HCC)   . GERD (gastroesophageal reflux disease)   . History of chicken pox 04/07/2015   DID have Chicken Pox.    . Hyperlipidemia   . Venous (peripheral) insufficiency     Patient Active Problem List   Diagnosis Date Noted  . Arthritis 05/10/2016  . Chronic kidney disease 04/05/2016  . Allergic rhinitis 04/07/2015  . Depression 04/07/2015  . Diabetes mellitus type 1.5,uncontrolled, managed as type 2 04/07/2015  . Diabetic retinopathy (HCC) 04/07/2015  . Dysesthesia 04/07/2015  . Family history of early CAD 04/07/2015  . GERD (gastroesophageal reflux disease) 04/07/2015  . Hyperlipidemia 04/07/2015  . Insomnia 04/07/2015  . Tobacco abuse 04/07/2015  . Venous insufficiency 04/07/2015  . Anxiety 04/07/2015    Past Surgical History:  Procedure Laterality Date  . APPENDECTOMY  1979  . BILATERAL CARPAL TUNNEL RELEASE Bilateral    Dr. Rosita Kea  . CESAREAN SECTION  2003  . LAPAROSCOPIC LYSIS OF ADHESIONS    . SHOULDER SURGERY Right 08/2009   Dr. Rosita Kea  . TUBAL LIGATION      Prior to Admission medications   Medication  Sig Start Date End Date Taking? Authorizing Provider  aspirin 81 MG tablet Take 81 mg by mouth daily.   Yes [provider]  cetirizine (ZYRTEC) 10 MG tablet TAKE 1 TABLET(10 MG) BY MOUTH DAILY 05/16/17  Yes Malva Limes, MD  clonazePAM (KLONOPIN) 0.5 MG tablet TAKE 1 TABLET BY MOUTH EVERY NIGHT AT BEDTIME Patient taking differently: Take 0.5 mg by mouth daily.  06/15/17  Yes Malva Limes, MD  FLUoxetine (PROZAC) 40 MG capsule TAKE 1 CAPSULE BY MOUTH EVERY DAY 02/12/17  Yes Malva Limes, MD  fluticasone [facility]) 50 MCG/ACT nasal spray SHAKE WELL AND USE 2 SPRAYS IN EACH NOSTRIL DAILY 04/02/17  Yes Malva Limes, MD  furosemide (LASIX) 40 MG tablet TAKE 1 TABLET BY MOUTH AS NEEDED FOR SWELLING 05/26/18  Yes Malva Limes, MD  gabapentin (NEURONTIN) 600 MG tablet Take 600 mg by mouth at bedtime.   Yes Bender, [name], MD  HUMALOG KWIKPEN 100 UNIT/ML KiwkPen 5 Units 3 (three) times daily with meals. INJ UP TO 100 UNI [place] PER DAY AS PER MD INSTRUCTIONS 10/02/16  Yes [provider]  hydrOXYzine (ATARAX/VISTARIL) 10 MG tablet Take 10 mg by mouth 3 (three) times daily as needed.   Yes Bender, [name], MD  LANTUS SOLOSTAR 100 UNIT/ML Solostar Pen INJECT 40 UNITS UNDER THE SKIN TWICE DAILY 01/31/17  Yes Malva Limes, MD  meloxicam (MOBIC) 15 MG tablet TAKE 1 TABLET(15 MG) BY MOUTH DAILY WITH FOOD AS  NEEDED FOR ARTHRITIS PAIN 06/15/17  Yes Malva LimesFisher, Donald E, MD  montelukast (SINGULAIR) 10 MG tablet TAKE 1 TABLET BY MOUTH AT BEDTIME 05/16/17  Yes Malva LimesFisher, Donald E, MD  PROAIR HFA 108 [phone](90 Base) MCG/ACT inhaler INHALE 2 PUFFS INTO THE LUNGS EVERY 6 HOURS AS NEEDED FOR WHEEZING OR SHORTNESS OF BREATH 02/12/17  Yes Malva LimesFisher, Donald E, MD  rosuvastatin (CRESTOR) 5 MG tablet TAKE 1 TABLET(5 MG) BY MOUTH DAILY 01/24/17  Yes Malva LimesFisher, Donald E, MD  cephALEXin (KEFLEX) 500 MG capsule Take 1 capsule (500 mg total) by mouth 3 (three) times daily for 10 days. 10/07/18 10/17/18  Maniyah Moller, Rulon Eisenmengerari B,  FNP  HYDROcodone-acetaminophen (NORCO/VICODIN) 5-325 MG tablet Take 1 tablet by mouth every 6 (six) hours as needed for up to 3 days for severe pain. 10/07/18 10/10/18  [place], Rulon Eisenmengerari B, FNP    Allergies Atorvastatin; Levaquin  [levofloxacin in d5w]; Simvastatin; Dilaudid  [hydromorphone hcl]; and Tetracycline  Family History  Problem Relation Age of Onset  . Hypertension Mother   . Diabetes Mother   . Hyperlipidemia Mother   . Arthritis Mother   . Heart disease Father   . Kidney disease Father     Social History Social History   Tobacco Use  . Smoking status: Former Smoker    Packs/day: 1.00    Years: 30.00    Pack years: 30.00    Types: Cigarettes    Start date: 04/06/1985    Last attempt to quit: 09/25/2017    Years since quitting: 1.0  . Smokeless tobacco: Never Used  Substance Use Topics  . Alcohol use: Yes    Alcohol/week: 1.0 - 2.0 standard drinks    Types: 1 - 2 Glasses of wine per week    Comment: Socially  . Drug use: No    Review of Systems  Constitutional: No fever/chills Eyes: No visual changes. ENT: No sore throat. Cardiovascular: Denies chest pain. Respiratory: Denies shortness of breath. Gastrointestinal: Positive for abdominal pain.  Positive for nausea, no vomiting.  No diarrhea.  No constipation. Genitourinary: Positive for dysuria. Musculoskeletal: Positive for back pain. Skin: Negative for rash. Neurological: Negative for headaches, focal weakness or numbness. ____________________________________________   PHYSICAL EXAM:  VITAL SIGNS: ED Triage Vitals  Enc Vitals Group     BP 10/07/18 1940 (!) 141/54     Pulse Rate 10/07/18 1940 93     Resp 10/07/18 1940 20     Temp 10/07/18 1940 98.3 F (36.8 C)     Temp Source 10/07/18 1940 Oral     SpO2 10/07/18 1940 98 %     Weight 10/07/18 1931 218 lb (98.9 kg)     Height 10/07/18 1931 5\' 3"  (1.6 m)     Head Circumference --      Peak Flow --      Pain Score 10/07/18 1931 10     Pain Loc --        Pain Edu? --      Excl. in GC? --     Constitutional: Alert and oriented.  Uncomfortable appearing and in no acute distress. Eyes: Conjunctivae are normal. Head: Atraumatic. Nose: No congestion/rhinnorhea. Mouth/Throat: Mucous membranes are moist.  Oropharynx non-erythematous. Neck: No stridor.   Cardiovascular: Normal rate, regular rhythm. Grossly normal heart sounds.  Good peripheral circulation. Respiratory: Normal respiratory effort.  No retractions. Lungs CTAB. Gastrointestinal: Soft and nontender. No distention. No abdominal bruits.  Left CVA tenderness. Musculoskeletal: No lower extremity tenderness nor edema.  No joint effusions. Neurologic:  Normal speech and language. No gross focal neurologic deficits are appreciated. No gait instability. Skin:  Skin is warm, dry and intact. No rash noted. Psychiatric: Mood and affect are normal. Speech and behavior are normal.  ____________________________________________   LABS (all labs ordered are listed, but only abnormal results are displayed)  Labs Reviewed  COMPREHENSIVE METABOLIC PANEL - Abnormal; Notable for the following components:      Result Value   Sodium 131 (*)    CO2 21 (*)    Glucose, Bld 485 (*)    Creatinine, Ser 1.45 (*)    Calcium 8.5 (*)    Alkaline Phosphatase 173 (*)    GFR calc non Af Amer 41 (*)    GFR calc Af Amer 48 (*)    All other components within normal limits  CBC WITH DIFFERENTIAL/PLATELET - Abnormal; Notable for the following components:   Lymphs Abs 0.4 (*)    Monocytes Absolute 0.0 (*)    All other components within normal limits  URINALYSIS, COMPLETE (UACMP) WITH MICROSCOPIC - Abnormal; Notable for the following components:   Color, Urine STRAW (*)    APPearance HAZY (*)    Glucose, UA >=500 (*)    Hgb urine dipstick SMALL (*)    Protein, ur 30 (*)    Leukocytes, UA SMALL (*)    Bacteria, UA RARE (*)    All other components within normal limits  GLUCOSE, CAPILLARY - Abnormal;  Notable for the following components:   Glucose-Capillary 488 (*)    All other components within normal limits  LIPASE, BLOOD  POC URINE PREG, ED  POCT PREGNANCY, URINE   ____________________________________________  EKG  Not indicated ____________________________________________  RADIOLOGY  ED MD interpretation:  Acute left pyelonephritis or recently passed stone with forniceal rupture without urinary calculi presents  Official radiology report(s): Ct Renal Stone Study  Result Date: 10/07/2018 CLINICAL DATA:  53 year old female with sudden onset left flank pain today with nausea. Pain radiating to the groin. EXAM: CT ABDOMEN AND PELVIS WITHOUT CONTRAST TECHNIQUE: Multidetector CT imaging of the abdomen and pelvis was performed following the standard protocol without IV contrast. COMPARISON:  Lumbar MRI 03/24/2009. FINDINGS: Lower chest: Negative. Hepatobiliary: Negative noncontrast liver and gallbladder. Pancreas: Diminutive, but otherwise negative. Spleen: Negative. Adrenals/Urinary Tract: Adrenal glands are within normal limits. There are renal vascular calcifications on the right. Otherwise negative noncontrast CT appearance of the right kidney. Negative right ureter. Diminutive and unremarkable urinary bladder. No calculus within the bladder. Abnormal left kidney is asymmetrically enlarged with moderate to severe left pararenal stranding and left pararenal space edema or trace fluid. Mild left hydronephrosis, but only intermittent asymmetric enlargement of the left ureter. Mild left periureteral stranding. No ureteral calculus or obstructing lesion identified to the bladder. Stomach/Bowel: Negative large bowel aside from retained stool, redundant sigmoid and right colon. Diminutive or absent appendix. Negative terminal ileum. No dilated small bowel. The stomach is mildly distended with fluid. No free air. No free fluid. Vascular/Lymphatic: Aortoiliac calcified atherosclerosis. Vascular  patency is not evaluated in the absence of IV contrast. Mild reactive appearing left pararenal retroperitoneal lymph nodes. Reproductive: Negative noncontrast appearance. Other: No pelvic free fluid. Musculoskeletal: No acute osseous abnormality identified. IMPRESSION: 1. Abnormal left kidney and pararenal space with regional reactive lymph nodes. Top differential considerations are: - Acute pyelonephritis (query fever, abnormal urinalysis) - Recently passed stone with forniceal rupture, but there are no urinary calculi identified. 2. Negative urinary bladder and right kidney (aside from right renal vascular calcifications). 3.  Aortic Atherosclerosis (ICD10-I70.0). Electronically Signed   By: Odessa Fleming M.D.   On: 10/07/2018 21:05    ____________________________________________   PROCEDURES  Procedure(s) performed: None  Procedures  Critical Care performed: No  ____________________________________________   INITIAL IMPRESSION / ASSESSMENT AND PLAN / ED COURSE  As part of my medical decision making, I reviewed the following data within the electronic MEDICAL RECORD NUMBER [place] Controlled Substance Database   53 year old female presenting to the emergency department for acute onset of left flank pain.  Initial concern was urethral lithiasis, however after urinalysis results and CT it appears they are both consistent with pyelonephritis.  She was given IV Rocephin and will be given 10 days of Keflex.  Her pain was controlled with fentanyl, Toradol, and Percocet.  Patient was strongly advised to call and schedule follow-up appointment with her primary care provider in about 2 weeks or sooner if not improving.  Prior to discharge, the patient felt as if her blood sugar was elevated as she has not had her insulin this evening.  Glucose was 433 on fingerstick.  Patient verbalizes intent to go home and take her medication and sliding scale insulin.         ____________________________________________   FINAL CLINICAL IMPRESSION(S) / ED DIAGNOSES  Final diagnoses:  Pyelonephritis     ED Discharge Orders         Ordered    cephALEXin (KEFLEX) 500 MG capsule  3 times daily     10/07/18 2251    HYDROcodone-acetaminophen (NORCO/VICODIN) 5-325 MG tablet  Every 6 hours PRN     10/07/18 2251           Note:  This document was prepared using Dragon voice recognition software and may include unintentional dictation errors.    Chinita Pester, FNP 10/08/18 0152    Willy Eddy, MD 10/08/18 1202

## 2018-11-13 ENCOUNTER — Ambulatory Visit: Payer: Medicaid Other | Attending: Oncology

## 2019-04-26 ENCOUNTER — Emergency Department: Payer: Medicaid Other

## 2019-04-26 ENCOUNTER — Encounter: Payer: Self-pay | Admitting: Emergency Medicine

## 2019-04-26 ENCOUNTER — Inpatient Hospital Stay
Admission: EM | Admit: 2019-04-26 | Discharge: 2019-05-06 | DRG: 871 | Disposition: A | Discharge status: Home / Self Care | Payer: Medicaid Other | Attending: Specialist | Admitting: Specialist

## 2019-04-26 ENCOUNTER — Other Ambulatory Visit: Payer: Self-pay

## 2019-04-26 DIAGNOSIS — Z794 Long term (current) use of insulin: Secondary | ICD-10-CM | POA: Diagnosis not present

## 2019-04-26 DIAGNOSIS — E1142 Type 2 diabetes mellitus with diabetic polyneuropathy: Secondary | ICD-10-CM | POA: Diagnosis present

## 2019-04-26 DIAGNOSIS — Z8249 Family history of ischemic heart disease and other diseases of the circulatory system: Secondary | ICD-10-CM | POA: Diagnosis not present

## 2019-04-26 DIAGNOSIS — E785 Hyperlipidemia, unspecified: Secondary | ICD-10-CM | POA: Diagnosis present

## 2019-04-26 DIAGNOSIS — Z833 Family history of diabetes mellitus: Secondary | ICD-10-CM

## 2019-04-26 DIAGNOSIS — E139 Other specified diabetes mellitus without complications: Secondary | ICD-10-CM

## 2019-04-26 DIAGNOSIS — N151 Renal and perinephric abscess: Secondary | ICD-10-CM

## 2019-04-26 DIAGNOSIS — R1013 Epigastric pain: Secondary | ICD-10-CM

## 2019-04-26 DIAGNOSIS — E1122 Type 2 diabetes mellitus with diabetic chronic kidney disease: Secondary | ICD-10-CM | POA: Diagnosis present

## 2019-04-26 DIAGNOSIS — K5900 Constipation, unspecified: Secondary | ICD-10-CM | POA: Diagnosis present

## 2019-04-26 DIAGNOSIS — Z8349 Family history of other endocrine, nutritional and metabolic diseases: Secondary | ICD-10-CM

## 2019-04-26 DIAGNOSIS — K219 Gastro-esophageal reflux disease without esophagitis: Secondary | ICD-10-CM | POA: Diagnosis present

## 2019-04-26 DIAGNOSIS — N179 Acute kidney failure, unspecified: Secondary | ICD-10-CM | POA: Diagnosis present

## 2019-04-26 DIAGNOSIS — E871 Hypo-osmolality and hyponatremia: Secondary | ICD-10-CM

## 2019-04-26 DIAGNOSIS — N12 Tubulo-interstitial nephritis, not specified as acute or chronic: Secondary | ICD-10-CM | POA: Diagnosis present

## 2019-04-26 DIAGNOSIS — J189 Pneumonia, unspecified organism: Secondary | ICD-10-CM | POA: Diagnosis present

## 2019-04-26 DIAGNOSIS — Z87891 Personal history of nicotine dependence: Secondary | ICD-10-CM

## 2019-04-26 DIAGNOSIS — D509 Iron deficiency anemia, unspecified: Secondary | ICD-10-CM | POA: Diagnosis present

## 2019-04-26 DIAGNOSIS — R109 Unspecified abdominal pain: Secondary | ICD-10-CM

## 2019-04-26 DIAGNOSIS — Z20828 Contact with and (suspected) exposure to other viral communicable diseases: Secondary | ICD-10-CM | POA: Diagnosis present

## 2019-04-26 DIAGNOSIS — E1165 Type 2 diabetes mellitus with hyperglycemia: Secondary | ICD-10-CM | POA: Diagnosis present

## 2019-04-26 DIAGNOSIS — F329 Major depressive disorder, single episode, unspecified: Secondary | ICD-10-CM | POA: Diagnosis present

## 2019-04-26 DIAGNOSIS — A419 Sepsis, unspecified organism: Secondary | ICD-10-CM | POA: Diagnosis present

## 2019-04-26 DIAGNOSIS — Z841 Family history of disorders of kidney and ureter: Secondary | ICD-10-CM | POA: Diagnosis not present

## 2019-04-26 DIAGNOSIS — I129 Hypertensive chronic kidney disease with stage 1 through stage 4 chronic kidney disease, or unspecified chronic kidney disease: Secondary | ICD-10-CM | POA: Diagnosis present

## 2019-04-26 DIAGNOSIS — N182 Chronic kidney disease, stage 2 (mild): Secondary | ICD-10-CM | POA: Diagnosis present

## 2019-04-26 DIAGNOSIS — Z7982 Long term (current) use of aspirin: Secondary | ICD-10-CM

## 2019-04-26 DIAGNOSIS — R140 Abdominal distension (gaseous): Secondary | ICD-10-CM

## 2019-04-26 DIAGNOSIS — Z8261 Family history of arthritis: Secondary | ICD-10-CM | POA: Diagnosis not present

## 2019-04-26 HISTORY — DX: Essential (primary) hypertension: I10

## 2019-04-26 LAB — URINALYSIS, COMPLETE (UACMP) WITH MICROSCOPIC
Bilirubin Urine: NEGATIVE
Glucose, UA: >=500 mg/dL — AB
Ketones, ur: 5 mg/dL — AB
Nitrite: NEGATIVE
Protein, ur: NEGATIVE mg/dL
Specific Gravity, Urine: 1.013 (ref 1.005–1.030)
pH: 7 (ref 5.0–8.0)

## 2019-04-26 LAB — CBC
HCT: 32.8 % — ABNORMAL LOW (ref 36.0–46.0)
Hemoglobin: 10.5 g/dL — ABNORMAL LOW (ref 12.0–15.0)
MCH: 27.6 pg (ref 26.0–34.0)
MCHC: 32 g/dL (ref 30.0–36.0)
MCV: 86.3 fL (ref 80.0–100.0)
Platelets: 254 10*3/uL (ref 150–400)
RBC: 3.8 MIL/uL — ABNORMAL LOW (ref 3.87–5.11)
RDW: 14.6 % (ref 11.5–15.5)
WBC: 15.3 10*3/uL — ABNORMAL HIGH (ref 4.0–10.5)
nRBC: 0 % (ref 0.0–0.2)

## 2019-04-26 LAB — BASIC METABOLIC PANEL
Anion gap: 9 (ref 5–15)
BUN: 36 mg/dL — ABNORMAL HIGH (ref 6–20)
CO2: 22 mmol/L (ref 22–32)
Calcium: 7.9 mg/dL — ABNORMAL LOW (ref 8.9–10.3)
Chloride: 96 mmol/L — ABNORMAL LOW (ref 98–111)
Creatinine, Ser: 1.69 mg/dL — ABNORMAL HIGH (ref 0.44–1.00)
GFR calc Af Amer: 39 mL/min — ABNORMAL LOW (ref >60)
GFR calc non Af Amer: 34 mL/min — ABNORMAL LOW (ref >60)
Glucose, Bld: 409 mg/dL — ABNORMAL HIGH (ref 70–99)
Potassium: 5.4 mmol/L — ABNORMAL HIGH (ref 3.5–5.1)
Sodium: 127 mmol/L — ABNORMAL LOW (ref 135–145)

## 2019-04-26 LAB — SARS CORONAVIRUS 2 BY RT PCR (HOSPITAL ORDER, PERFORMED IN ~~LOC~~ HOSPITAL LAB): SARS Coronavirus 2: NEGATIVE

## 2019-04-26 LAB — GLUCOSE, CAPILLARY
Glucose-Capillary: 398 mg/dL — ABNORMAL HIGH (ref 70–99)
Glucose-Capillary: 403 mg/dL — ABNORMAL HIGH (ref 70–99)

## 2019-04-26 MED ORDER — GABAPENTIN 600 MG PO TABS
600.0000 mg | ORAL_TABLET | Freq: Every day | ORAL | Status: DC
  Administered 2019-04-26 – 2019-05-05 (×10): 600 mg via ORAL
  Filled 2019-04-26 (×10): qty 1

## 2019-04-26 MED ORDER — TRAMADOL HCL 50 MG PO TABS
50.0000 mg | ORAL_TABLET | Freq: Four times a day (QID) | ORAL | Status: DC | PRN
  Administered 2019-04-26 – 2019-05-06 (×14): 50 mg via ORAL
  Filled 2019-04-26 (×14): qty 1

## 2019-04-26 MED ORDER — HYDROXYZINE HCL 10 MG PO TABS
10.0000 mg | ORAL_TABLET | Freq: Three times a day (TID) | ORAL | Status: DC | PRN
  Filled 2019-04-26: qty 1

## 2019-04-26 MED ORDER — AZITHROMYCIN 250 MG PO TABS
250.0000 mg | ORAL_TABLET | Freq: Every day | ORAL | Status: DC
  Administered 2019-04-27 – 2019-04-30 (×4): 250 mg via ORAL
  Filled 2019-04-26 (×4): qty 1

## 2019-04-26 MED ORDER — ASPIRIN EC 81 MG PO TBEC
81.0000 mg | DELAYED_RELEASE_TABLET | Freq: Every day | ORAL | Status: DC
  Administered 2019-04-26 – 2019-05-06 (×11): 81 mg via ORAL
  Filled 2019-04-26 (×11): qty 1

## 2019-04-26 MED ORDER — DIPHENHYDRAMINE HCL 25 MG PO CAPS: 25.0000 mg | ORAL_CAPSULE | Freq: Four times a day (QID) | ORAL | Status: DC | PRN

## 2019-04-26 MED ORDER — SODIUM CHLORIDE 0.9 % IV BOLUS
1000.0000 mL | Freq: Once | INTRAVENOUS | Status: AC
  Administered 2019-04-26: 1000 mL via INTRAVENOUS

## 2019-04-26 MED ORDER — ACETAMINOPHEN 325 MG PO TABS: 650.0000 mg | ORAL_TABLET | Freq: Four times a day (QID) | ORAL | Status: DC | PRN

## 2019-04-26 MED ORDER — MORPHINE SULFATE (PF) 2 MG/ML IV SOLN
2.0000 mg | INTRAVENOUS | Status: DC | PRN
  Administered 2019-04-26 – 2019-05-06 (×17): 2 mg via INTRAVENOUS
  Filled 2019-04-26 (×17): qty 1

## 2019-04-26 MED ORDER — MONTELUKAST SODIUM 10 MG PO TABS
10.0000 mg | ORAL_TABLET | Freq: Every day | ORAL | Status: DC
  Administered 2019-04-26 – 2019-05-05 (×10): 10 mg via ORAL
  Filled 2019-04-26 (×11): qty 1

## 2019-04-26 MED ORDER — SODIUM CHLORIDE 0.9 % IV SOLN
INTRAVENOUS | Status: DC
  Administered 2019-04-26 – 2019-04-29 (×7): via INTRAVENOUS

## 2019-04-26 MED ORDER — POLYETHYLENE GLYCOL 3350 17 G PO PACK: 17.0000 g | PACK | Freq: Every day | ORAL | Status: DC | PRN

## 2019-04-26 MED ORDER — INSULIN ASPART 100 UNIT/ML ~~LOC~~ SOLN
0.0000 [IU] | Freq: Three times a day (TID) | SUBCUTANEOUS | Status: DC
  Administered 2019-04-27: 3 [IU] via SUBCUTANEOUS
  Administered 2019-04-27 (×2): 5 [IU] via SUBCUTANEOUS
  Administered 2019-04-28: 2 [IU] via SUBCUTANEOUS
  Administered 2019-04-28 – 2019-04-29 (×2): 3 [IU] via SUBCUTANEOUS
  Administered 2019-04-29: 8 [IU] via SUBCUTANEOUS
  Administered 2019-04-30: 5 [IU] via SUBCUTANEOUS
  Administered 2019-04-30: 8 [IU] via SUBCUTANEOUS
  Administered 2019-05-01: 18:00:00 11 [IU] via SUBCUTANEOUS
  Administered 2019-05-02: 3 [IU] via SUBCUTANEOUS
  Administered 2019-05-02: 11 [IU] via SUBCUTANEOUS
  Administered 2019-05-02: 3 [IU] via SUBCUTANEOUS
  Administered 2019-05-03: 8 [IU] via SUBCUTANEOUS
  Administered 2019-05-03: 2 [IU] via SUBCUTANEOUS
  Administered 2019-05-03 – 2019-05-04 (×2): 15 [IU] via SUBCUTANEOUS
  Administered 2019-05-04: 8 [IU] via SUBCUTANEOUS
  Administered 2019-05-04: 11 [IU] via SUBCUTANEOUS
  Administered 2019-05-05: 2 [IU] via SUBCUTANEOUS
  Administered 2019-05-05: 15 [IU] via SUBCUTANEOUS
  Administered 2019-05-05: 11 [IU] via SUBCUTANEOUS
  Administered 2019-05-06: 15 [IU] via SUBCUTANEOUS
  Administered 2019-05-06: 3 [IU] via SUBCUTANEOUS
  Filled 2019-04-26 (×24): qty 1

## 2019-04-26 MED ORDER — LORATADINE 10 MG PO TABS
10.0000 mg | ORAL_TABLET | Freq: Every day | ORAL | Status: DC
  Administered 2019-04-26 – 2019-05-06 (×11): 10 mg via ORAL
  Filled 2019-04-26 (×11): qty 1

## 2019-04-26 MED ORDER — FENTANYL CITRATE (PF) 100 MCG/2ML IJ SOLN
50.0000 ug | Freq: Once | INTRAMUSCULAR | Status: AC
  Administered 2019-04-26: 50 ug via INTRAVENOUS
  Filled 2019-04-26: qty 2

## 2019-04-26 MED ORDER — ROSUVASTATIN CALCIUM 10 MG PO TABS
5.0000 mg | ORAL_TABLET | Freq: Every day | ORAL | Status: DC
  Administered 2019-04-26 – 2019-05-05 (×10): 5 mg via ORAL
  Filled 2019-04-26 (×10): qty 1

## 2019-04-26 MED ORDER — INSULIN ASPART 100 UNIT/ML ~~LOC~~ SOLN
0.0000 [IU] | Freq: Every day | SUBCUTANEOUS | Status: DC
  Administered 2019-04-26: 5 [IU] via SUBCUTANEOUS
  Administered 2019-04-27: 3 [IU] via SUBCUTANEOUS
  Administered 2019-04-29 – 2019-04-30 (×2): 2 [IU] via SUBCUTANEOUS
  Administered 2019-05-01: 3 [IU] via SUBCUTANEOUS
  Administered 2019-05-02: 2 [IU] via SUBCUTANEOUS
  Administered 2019-05-03: 3 [IU] via SUBCUTANEOUS
  Administered 2019-05-04: 5 [IU] via SUBCUTANEOUS
  Filled 2019-04-26 (×8): qty 1

## 2019-04-26 MED ORDER — ONDANSETRON HCL 4 MG/2ML IJ SOLN
4.0000 mg | Freq: Once | INTRAMUSCULAR | Status: AC
  Administered 2019-04-26: 4 mg via INTRAVENOUS
  Filled 2019-04-26: qty 2

## 2019-04-26 MED ORDER — FLUTICASONE PROPIONATE 50 MCG/ACT NA SUSP
1.0000 | Freq: Every day | NASAL | Status: DC
  Administered 2019-04-26 – 2019-05-06 (×11): 1 via NASAL
  Filled 2019-04-26: qty 16

## 2019-04-26 MED ORDER — ACETAMINOPHEN 650 MG RE SUPP: 650.0000 mg | Freq: Four times a day (QID) | RECTAL | Status: DC | PRN

## 2019-04-26 MED ORDER — ONDANSETRON HCL 4 MG PO TABS: 4.0000 mg | ORAL_TABLET | Freq: Four times a day (QID) | ORAL | Status: DC | PRN

## 2019-04-26 MED ORDER — ONDANSETRON HCL 4 MG/2ML IJ SOLN
4.0000 mg | Freq: Four times a day (QID) | INTRAMUSCULAR | Status: DC | PRN
  Administered 2019-04-26 – 2019-05-03 (×3): 4 mg via INTRAVENOUS
  Filled 2019-04-26 (×3): qty 2

## 2019-04-26 MED ORDER — INSULIN LISPRO (1 UNIT DIAL) 100 UNIT/ML (KWIKPEN): 5.0000 [IU] | PEN_INJECTOR | Freq: Three times a day (TID) | SUBCUTANEOUS | Status: DC

## 2019-04-26 MED ORDER — INSULIN GLARGINE 100 UNIT/ML ~~LOC~~ SOLN
45.0000 [IU] | Freq: Every day | SUBCUTANEOUS | Status: DC
  Administered 2019-04-26 – 2019-04-28 (×3): 45 [IU] via SUBCUTANEOUS
  Filled 2019-04-26 (×4): qty 0.45

## 2019-04-26 MED ORDER — FENTANYL CITRATE (PF) 100 MCG/2ML IJ SOLN
50.0000 ug | INTRAMUSCULAR | Status: DC | PRN
  Filled 2019-04-26: qty 2

## 2019-04-26 MED ORDER — HEPARIN SODIUM (PORCINE) 5000 UNIT/ML IJ SOLN
5000.0000 [IU] | Freq: Three times a day (TID) | INTRAMUSCULAR | Status: DC
  Administered 2019-04-26 – 2019-05-06 (×29): 5000 [IU] via SUBCUTANEOUS
  Filled 2019-04-26 (×29): qty 1

## 2019-04-26 MED ORDER — ALBUTEROL SULFATE (2.5 MG/3ML) 0.083% IN NEBU
2.5000 mg | INHALATION_SOLUTION | RESPIRATORY_TRACT | Status: DC | PRN
  Administered 2019-05-04: 2.5 mg via RESPIRATORY_TRACT
  Filled 2019-04-26: qty 3

## 2019-04-26 MED ORDER — FENTANYL CITRATE (PF) 100 MCG/2ML IJ SOLN
50.0000 ug | INTRAMUSCULAR | Status: AC | PRN
  Administered 2019-04-26 (×2): 50 ug via INTRAVENOUS
  Filled 2019-04-26: qty 2

## 2019-04-26 MED ORDER — ALBUTEROL SULFATE HFA 108 (90 BASE) MCG/ACT IN AERS: 1.0000 | INHALATION_SPRAY | Freq: Four times a day (QID) | RESPIRATORY_TRACT | Status: DC | PRN

## 2019-04-26 MED ORDER — ONDANSETRON HCL 4 MG/2ML IJ SOLN: 4.0000 mg | Freq: Once | INTRAMUSCULAR | Status: DC

## 2019-04-26 MED ORDER — FLUOXETINE HCL 20 MG PO CAPS
40.0000 mg | ORAL_CAPSULE | Freq: Every day | ORAL | Status: DC
  Administered 2019-04-26 – 2019-05-06 (×11): 40 mg via ORAL
  Filled 2019-04-26 (×11): qty 2

## 2019-04-26 MED ORDER — SODIUM CHLORIDE 0.9 % IV SOLN
1.0000 g | INTRAVENOUS | Status: DC
  Administered 2019-04-26 – 2019-04-29 (×4): 1 g via INTRAVENOUS
  Filled 2019-04-26 (×2): qty 10
  Filled 2019-04-26 (×3): qty 1

## 2019-04-26 NOTE — ED Triage Notes (Signed)
Pt to ED via POV c/o left flank pain x 3 days. Pt states that she was seen by her PCP and given antibiotics but she is unsure what she was given. Pt states that her PCP told her if she was not better in 3 days to come to the hospital. Pt reports that she is also having epigastric pain. Pt denies diarrhea but states that she has been vomiting. Pt appears uncomfortable at this time.

## 2019-04-26 NOTE — H&P (Signed)
[facility] - [place] at [facility]   PATIENT NAME: Emily FrederickLorie Collins    MR#:  [id number]  DATE OF BIRTH:  [birth year]  DATE OF ADMISSION:  04/26/2019  PRIMARY CARE PHYSICIAN: Oswaldo ConroyBender, Abby Daneele, MD   REQUESTING/REFERRING PHYSICIAN: Dr. Juliette AlcideMelinda  CHIEF COMPLAINT:   Bilateral lower back pain more on the left and right for few days. HISTORY OF PRESENT ILLNESS:  Emily Collins  is a 53 y.o. female with a known history of two diabetes, Genella RifeGerd, hyperlipidemia, anxiety comes to the emergency room with significant lower back pain more on the left and right along with nausea and vomiting. Patient does not report reckoning any temperature at home. She is quite uncomfortable in the ER.  Workup in the the ER showed leukocytosis with UTI and CT abdomen showing pyelonephritis on the left. Patient also appears to be dehydrated with sodium of 127 and elevated creatinine. She likely has bilateral atelectasis versus pneumonia. Patient received IV Rocephin and Zithromax in the ER. She is being admitted with sepsis secondary to UTI/pneumonia. PAST MEDICAL HISTORY:   Past Medical History:  Diagnosis Date  . Anxiety   . Arthritis   . Depression   . Diabetes mellitus without complication (HCC)   . GERD (gastroesophageal reflux disease)   . History of chicken pox 04/07/2015   DID have Chicken Pox.    . Hyperlipidemia   . Venous (peripheral) insufficiency     PAST SURGICAL HISTOIRY:   Past Surgical History:  Procedure Laterality Date  . APPENDECTOMY  1979  . BILATERAL CARPAL TUNNEL RELEASE Bilateral    Dr. Rosita KeaMenz  . CESAREAN SECTION  2003  . LAPAROSCOPIC LYSIS OF ADHESIONS    . SHOULDER SURGERY Right 08/2009   Dr. Rosita KeaMenz  . TUBAL LIGATION      SOCIAL HISTORY:   Social History   Tobacco Use  . Smoking status: Former Smoker    Packs/day: 1.00    Years: 30.00    Pack years: 30.00    Types: Cigarettes    Start date: 04/06/1985    Quit date: 09/25/2017    Years since  quitting: 1.5  . Smokeless tobacco: Never Used  Substance Use Topics  . Alcohol use: Yes    Alcohol/week: 1.0 - 2.0 standard drinks    Types: 1 - 2 Glasses of wine per week    Comment: Socially    FAMILY HISTORY:   Family History  Problem Relation Age of Onset  . Hypertension Mother   . Diabetes Mother   . Hyperlipidemia Mother   . Arthritis Mother   . Heart disease Father   . Kidney disease Father     DRUG ALLERGIES:   Allergies  Allergen Reactions  . Atorvastatin     Muscle and join pains.  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 Levaquin  [Levofloxacin In D5w]     GI upset  . Simvastatin     joint aches.  . Dilaudid  [Hydromorphone Hcl] Hives, Itching and Rash  . Tetracycline Rash    REVIEW OF SYSTEMS:  Review of Systems  Constitutional: Positive for malaise/fatigue. Negative for chills, fever and weight loss.  HENT: Negative for ear discharge, ear pain and nosebleeds.   Eyes: Negative for blurred vision, pain and discharge.  Respiratory: Negative for sputum production, shortness of breath, wheezing and stridor.   Cardiovascular: Negative for chest pain, palpitations, orthopnea and PND.  Gastrointestinal: Positive for abdominal pain, nausea and vomiting. Negative for diarrhea.  Genitourinary: Positive for flank pain. Negative for frequency and urgency.  Musculoskeletal: Negative for back pain and joint pain.  Neurological: Negative for sensory change, speech change, focal weakness and weakness.  Psychiatric/Behavioral: Negative for depression and hallucinations. The patient is not nervous/anxious.      MEDICATIONS AT HOME:   Prior to Admission medications   Medication Sig Start Date End Date Taking? Authorizing Provider  aspirin 81 MG tablet Take 81 mg by mouth daily.   Yes [provider]  cetirizine (ZYRTEC) 10 MG tablet TAKE 1 TABLET(10 MG) BY MOUTH DAILY Patient taking differently: Take 10 mg by mouth daily.  05/16/17  Yes Birdie Sons, MD  FLUoxetine (PROZAC) 40 MG capsule TAKE  1 CAPSULE BY MOUTH EVERY DAY Patient taking differently: Take 40 mg by mouth daily. TAKE 1 CAPSULE BY MOUTH EVERY DAY 02/12/17  Yes Birdie Sons, MD  gabapentin (NEURONTIN) 600 MG tablet Take 600 mg by mouth at bedtime.   Yes Bender, Durene Cal, MD  HUMALOG KWIKPEN 100 UNIT/ML KiwkPen 5 Units 3 (three) times daily with meals. INJ UP TO 100 UNI [place] PER DAY AS PER MD INSTRUCTIONS 10/02/16  Yes [provider]  hydrOXYzine (ATARAX/VISTARIL) 10 MG tablet Take 10 mg by mouth 3 (three) times daily as needed.   Yes Bender, Durene Cal, MD  LANTUS SOLOSTAR 100 UNIT/ML Solostar Pen INJECT 40 UNITS UNDER THE SKIN TWICE DAILY Patient taking differently: Inject 40-45 Units into the skin 2 (two) times daily.  01/31/17  Yes Birdie Sons, MD  meloxicam (MOBIC) 15 MG tablet TAKE 1 TABLET(15 MG) BY MOUTH DAILY WITH FOOD AS NEEDED FOR ARTHRITIS PAIN 06/15/17  Yes Birdie Sons, MD  montelukast (SINGULAIR) 10 MG tablet TAKE 1 TABLET BY MOUTH AT BEDTIME Patient taking differently: Take 10 mg by mouth at bedtime.  05/16/17  Yes Birdie Sons, MD  rosuvastatin (CRESTOR) 5 MG tablet TAKE 1 TABLET(5 MG) BY MOUTH DAILY 01/24/17  Yes Birdie Sons, MD  fluticasone [facility]) 50 MCG/ACT nasal spray SHAKE WELL AND USE 2 SPRAYS IN EACH NOSTRIL DAILY 04/02/17   Birdie Sons, MD  furosemide (LASIX) 40 MG tablet TAKE 1 TABLET BY MOUTH AS NEEDED FOR SWELLING Patient taking differently: Take 40 mg by mouth daily. TAKE 1 TABLET BY MOUTH AS NEEDED FOR SWELLING 05/26/18   Birdie Sons, MD  PROAIR HFA 108 [phone] Base) MCG/ACT inhaler INHALE 2 PUFFS INTO THE LUNGS EVERY 6 HOURS AS NEEDED FOR WHEEZING OR SHORTNESS OF BREATH 02/12/17   Birdie Sons, MD      VITAL SIGNS:  Blood pressure 129/65, pulse 94, height 5\' 3"  (1.6 m), weight 100.2 kg, SpO2 95 %.  PHYSICAL EXAMINATION:  GENERAL:  53 y.o.-year-old patient lying in the bed with mild acute distress.  EYES: Pupils equal, round, reactive to light and  accommodation. No scleral icterus. Extraocular muscles intact.  HEENT: Head atraumatic, normocephalic. Oropharynx and nasopharynx clear.  NECK:  Supple, no jugular venous distention. No thyroid enlargement, no tenderness.  LUNGS: Normal breath sounds bilaterally, no wheezing, rales,rhonchi or crepitation. No use of accessory muscles of respiration.  CARDIOVASCULAR: S1, S2 normal. No murmurs, rubs, or gallops.  ABDOMEN: Soft, nontender, nondistended. Bowel sounds present. No organomegaly or mass. biLateral flank pain EXTREMITIES: No pedal edema, cyanosis, or clubbing.  NEUROLOGIC: Cranial nerves II through XII are intact. Muscle strength 5/5 in all extremities. Sensation intact. Gait not checked.  PSYCHIATRIC: The patient is alert and oriented x 3.  SKIN: No obvious rash, lesion, or ulcer.   LABORATORY PANEL:  CBC Recent Labs  Lab 04/26/19 1251  WBC 15.3*  HGB 10.5*  HCT 32.8*  PLT 254   ------------------------------------------------------------------------------------------------------------------  Chemistries  Recent Labs  Lab 04/26/19 1251  NA 127*  K 5.4*  CL 96*  CO2 22  GLUCOSE 409*  BUN 36*  CREATININE 1.69*  CALCIUM 7.9*   ------------------------------------------------------------------------------------------------------------------  Cardiac Enzymes No results for input(s): TROPONINI in the last 168 hours. ------------------------------------------------------------------------------------------------------------------  RADIOLOGY:  Ct Renal Stone Study  Result Date: 04/26/2019 CLINICAL DATA:  Left flank pain EXAM: CT ABDOMEN AND PELVIS WITHOUT CONTRAST TECHNIQUE: Multidetector CT imaging of the abdomen and pelvis was performed following the standard protocol without oral or IV contrast. COMPARISON:  October 07, 2018 FINDINGS: Lower chest: There is a small left pleural effusion. There is consolidation in each posterior lung base, more on the left than on the  right. There are foci of coronary artery calcification. Hepatobiliary: There is hepatic steatosis. No focal liver lesions are evident on this noncontrast enhanced study. The gallbladder appears mildly distended without wall thickening. There is no evident biliary duct dilatation. Pancreas: There is no pancreatic mass or inflammatory focus. Spleen: No splenic lesions are evident. Adrenals/Urinary Tract: Adrenals bilaterally appear normal. Left kidney appears mildly edematous with soft tissue stranding in the perinephric fascia on the left, similar to study from 6 months prior. There is no evident renal mass on either side. There is slight fullness of the left renal collecting system, similar to prior study. No evident hydronephrosis on the right. There is an extrarenal pelvis on each side, an anatomic variant. No renal or ureteral calculus is demonstrable on either side. Urinary bladder is midline with wall thickness within normal limits. Stomach/Bowel: There is no appreciable bowel wall or mesenteric thickening. There is fairly diffuse stool throughout most of the colon. No evident bowel obstruction. Terminal ileum appears unremarkable. There is no evident free air or portal venous air. Vascular/Lymphatic: There are foci of aortic and iliac artery atherosclerosis. No aneurysm evident. No adenopathy is appreciable in the abdomen or pelvis. Reproductive: Uterus is anteverted. There is no evident pelvic mass. Other: Appendix absent. No periappendiceal region inflammatory change. There is no abscess or ascites in the abdomen or pelvis. There is a rather minimal ventral hernia containing only fat. Musculoskeletal: There are no blastic or lytic bone lesions. There is no intramuscular lesion evident. IMPRESSION: 1. There remains a degree of left renal edema with perinephric stranding and slight hydronephrosis. No obstructing focus evident in the left kidney or ureter. Specifically, no calculi evident. Question chronic  recurrent pyelonephritis on the left. Recent ureteral calculus passage on the left could present similarly. The lack of calculi both on the previous study in currently makes infectious etiology and more likely consideration. Appropriate clinical and laboratory assessment in this regard advised. 2. Apparent pneumonia in both posterior lung bases, more on the left than on the right. Small left pleural effusion noted. 3. No bowel obstruction. No abscess in the abdomen or pelvis. Appendix absent. Diffuse stool noted throughout much of the colon. 4. Hepatic steatosis. Gallbladder somewhat distended without wall thickening. 5. Aortic and iliac artery atherosclerosis. Foci of coronary artery calcification noted. 6.  Small ventral hernia containing only fat. ww Electronically Signed   By: Bretta BangWilliam  Woodruff III M.D.   On: 04/26/2019 15:38    EKG:    IMPRESSION AND PLAN:  Emily Collins  is a 53 y.o. female with a known history of two diabetes, Genella RifeGerd, hyperlipidemia, anxiety comes to the emergency room  with significant lower back pain more on the left and right along with nausea and vomiting. Patient does not report reckoning any temperature at home. She is quite uncomfortable in the ER.  1. Sepsis due to pyelonephritis left and pneumonia -admit to medical floor -IV fluids, IV and PO PRN pain meds -IV Rocephin and Zithromax -follow-up urine culture and blood culture  2. Pneumonia-community acquired -treatment as above -incentive spirometer  3. Uncontrolled diabetes in the setting of sepsis -Lantus and sliding scale insulin  4. Acute on chronic renal failure stage II -IV fluids -monitor input output -avoid nephrotoxic agents  5. DVT prophylaxis subcu heparin  Discussed with family in the ER  All the records are reviewed and case discussed with ED provider.   CODE STATUS: full  TOTAL TIME TAKING CARE OF THIS PATIENT: 50 minutes.    Enedina FinnerSona Franz Svec M.D on 04/26/2019 at 6:09 PM  Between 7am to 6pm  - Pager - [phone]  After 6pm go to [email or web address] - password [email or web address] [facility]  [facility]  Office  [phone]  CC: Primary care physician; Oswaldo ConroyBender, Abby Daneele, MD

## 2019-04-26 NOTE — ED Provider Notes (Signed)
[facility] Emergency Department Provider Note   ____________________________________________   First MD Initiated Contact with Patient 04/26/19 1446     (approximate)  I have reviewed the triage vital signs and the nursing notes.   HISTORY  Chief Complaint Flank Pain    HPI Emily Collins is a 53 y.o. female who comes in complaining 3 days of left flank pain.  She says is very uncomfortable she cannot lay on her to be comfortable at all.  She is also having some left upper quadrant and epigastric pain.  She is having some vomiting but not diarrhea.  She is uncomfortable.   Pain is severe deep and achy made worse by movement or palpation percussion      Past Medical History:  Diagnosis Date  . Anxiety   . Arthritis   . Depression   . Diabetes mellitus without complication (HCC)   . GERD (gastroesophageal reflux disease)   . History of chicken pox 04/07/2015   DID have Chicken Pox.    . Hyperlipidemia   . Venous (peripheral) insufficiency     Patient Active Problem List   Diagnosis Date Noted  . Arthritis 05/10/2016  . Chronic kidney disease 04/05/2016  . Allergic rhinitis 04/07/2015  . Depression 04/07/2015  . Diabetes mellitus type 1.5,uncontrolled, managed as type 2 04/07/2015  . Diabetic retinopathy (HCC) 04/07/2015  . Dysesthesia 04/07/2015  . Family history of early CAD 04/07/2015  . GERD (gastroesophageal reflux disease) 04/07/2015  . Hyperlipidemia 04/07/2015  . Insomnia 04/07/2015  . Tobacco abuse 04/07/2015  . Venous insufficiency 04/07/2015  . Anxiety 04/07/2015    Past Surgical History:  Procedure Laterality Date  . APPENDECTOMY  1979  . BILATERAL CARPAL TUNNEL RELEASE Bilateral    Dr. Rosita KeaMenz  . CESAREAN SECTION  2003  . LAPAROSCOPIC LYSIS OF ADHESIONS    . SHOULDER SURGERY Right 08/2009   Dr. Rosita KeaMenz  . TUBAL LIGATION      Prior to Admission medications   Medication Sig Start Date End Date Taking? Authorizing  Provider  aspirin 81 MG tablet Take 81 mg by mouth daily.   Yes [provider]  cetirizine (ZYRTEC) 10 MG tablet TAKE 1 TABLET(10 MG) BY MOUTH DAILY Patient taking differently: Take 10 mg by mouth daily.  05/16/17  Yes Malva LimesFisher, Donald E, MD  FLUoxetine (PROZAC) 40 MG capsule TAKE 1 CAPSULE BY MOUTH EVERY DAY Patient taking differently: Take 40 mg by mouth daily. TAKE 1 CAPSULE BY MOUTH EVERY DAY 02/12/17  Yes Malva LimesFisher, Donald E, MD  gabapentin (NEURONTIN) 600 MG tablet Take 600 mg by mouth at bedtime.   Yes Bender, Earl LagosAbby Daneele, MD  HUMALOG KWIKPEN 100 UNIT/ML KiwkPen 5 Units 3 (three) times daily with meals. INJ UP TO 100 UNI [place] PER DAY AS PER MD INSTRUCTIONS 10/02/16  Yes [provider]  hydrOXYzine (ATARAX/VISTARIL) 10 MG tablet Take 10 mg by mouth 3 (three) times daily as needed.   Yes Bender, Earl LagosAbby Daneele, MD  LANTUS SOLOSTAR 100 UNIT/ML Solostar Pen INJECT 40 UNITS UNDER THE SKIN TWICE DAILY Patient taking differently: Inject 40-45 Units into the skin 2 (two) times daily.  01/31/17  Yes Malva LimesFisher, Donald E, MD  meloxicam (MOBIC) 15 MG tablet TAKE 1 TABLET(15 MG) BY MOUTH DAILY WITH FOOD AS NEEDED FOR ARTHRITIS PAIN 06/15/17  Yes Malva LimesFisher, Donald E, MD  montelukast (SINGULAIR) 10 MG tablet TAKE 1 TABLET BY MOUTH AT BEDTIME Patient taking differently: Take 10 mg by mouth at bedtime.  05/16/17  Yes Malva LimesFisher, Donald E, MD  rosuvastatin (CRESTOR) 5 MG tablet TAKE 1 TABLET(5 MG) BY MOUTH DAILY 01/24/17  Yes Malva LimesFisher, Donald E, MD  clonazePAM (KLONOPIN) 0.5 MG tablet TAKE 1 TABLET BY MOUTH EVERY NIGHT AT BEDTIME Patient not taking: No sig reported 06/15/17   Malva LimesFisher, Donald E, MD  fluticasone [facility](FLONASE) 50 MCG/ACT nasal spray SHAKE WELL AND USE 2 SPRAYS IN EACH NOSTRIL DAILY 04/02/17   Malva LimesFisher, Donald E, MD  furosemide (LASIX) 40 MG tablet TAKE 1 TABLET BY MOUTH AS NEEDED FOR SWELLING Patient taking differently: Take 40 mg by mouth daily. TAKE 1 TABLET BY MOUTH AS NEEDED FOR SWELLING 05/26/18   Malva LimesFisher,  Donald E, MD  PROAIR HFA 108 [phone](90 Base) MCG/ACT inhaler INHALE 2 PUFFS INTO THE LUNGS EVERY 6 HOURS AS NEEDED FOR WHEEZING OR SHORTNESS OF BREATH 02/12/17   Malva LimesFisher, Donald E, MD    Allergies Atorvastatin, Levaquin  [levofloxacin in d5w], Simvastatin, Dilaudid  [hydromorphone hcl], and Tetracycline  Family History  Problem Relation Age of Onset  . Hypertension Mother   . Diabetes Mother   . Hyperlipidemia Mother   . Arthritis Mother   . Heart disease Father   . Kidney disease Father     Social History Social History   Tobacco Use  . Smoking status: Former Smoker    Packs/day: 1.00    Years: 30.00    Pack years: 30.00    Types: Cigarettes    Start date: 04/06/1985    Quit date: 09/25/2017    Years since quitting: 1.5  . Smokeless tobacco: Never Used  Substance Use Topics  . Alcohol use: Yes    Alcohol/week: 1.0 - 2.0 standard drinks    Types: 1 - 2 Glasses of wine per week    Comment: Socially  . Drug use: No    Review of Systems  Constitutional: Feels hot but no documented fever Eyes: No visual changes. ENT: No sore throat. Cardiovascular: Denies chest pain. Respiratory: Denies shortness of breath. Gastrointestinal: See HPI Genitourinary: Negative for dysuria. Musculoskeletal: Negative for back pain. Skin: Negative for rash. Neurological: Negative for headaches, focal weakness   ____________________________________________   PHYSICAL EXAM:  VITAL SIGNS: ED Triage Vitals  Enc Vitals Group     BP 04/26/19 1515 136/72     Pulse Rate 04/26/19 1515 89     Resp --      Temp --      Temp src --      SpO2 04/26/19 1515 96 %     Weight 04/26/19 1233 221 lb (100.2 kg)     Height 04/26/19 1233 5\' 3"  (1.6 m)     Head Circumference --      Peak Flow --      Pain Score 04/26/19 1232 10     Pain Loc --      Pain Edu? --      Excl. in GC? --     Constitutional: Alert and oriented.  Looks uncomfortable and ill Eyes: Conjunctivae are normal.  Head: Atraumatic.  Nose: No congestion/rhinnorhea. Mouth/Throat: Mucous membranes are moist.  Oropharynx non-erythematous. Neck: No stridor.  Cardiovascular: Normal rate, regular rhythm. Grossly normal heart sounds.  Good peripheral circulation. Respiratory: Normal respiratory effort.  No retractions. Lungs CTAB. Gastrointestinal: Soft tender to palpation and percussion left upper quadrant. No distention. No abdominal bruits.  Left CVA tenderness. Musculoskeletal: No lower extremity tenderness nor edema.   Neurologic:  Normal speech and language. No gross focal neurologic deficits are appreciated.  Skin:  Skin is warm, dry and intact. No rash noted.   ____________________________________________   LABS (all labs ordered are listed, but only abnormal results are displayed)  Labs Reviewed  BASIC METABOLIC PANEL - Abnormal; Notable for the following components:      Result Value   Sodium 127 (*)    Potassium 5.4 (*)    Chloride 96 (*)    Glucose, Bld 409 (*)    BUN 36 (*)    Creatinine, Ser 1.69 (*)    Calcium 7.9 (*)    GFR calc non Af Amer 34 (*)    GFR calc Af Amer 39 (*)    All other components within normal limits  CBC - Abnormal; Notable for the following components:   WBC 15.3 (*)    RBC 3.80 (*)    Hemoglobin 10.5 (*)    HCT 32.8 (*)    All other components within normal limits  URINALYSIS, COMPLETE (UACMP) WITH MICROSCOPIC   ____________________________________________  EKG   ____________________________________________  RADIOLOGY  ED MD interpretation: CT read by radiology reviewed by me shows bibasilar pneumonia worse on the left with some haziness around the left kidney most consistent with pyelonephritis.  Official radiology report(s): Ct Renal Stone Study  Result Date: 04/26/2019 CLINICAL DATA:  Left flank pain EXAM: CT ABDOMEN AND PELVIS WITHOUT CONTRAST TECHNIQUE: Multidetector CT imaging of the abdomen and pelvis was performed following the standard protocol without oral  or IV contrast. COMPARISON:  October 07, 2018 FINDINGS: Lower chest: There is a small left pleural effusion. There is consolidation in each posterior lung base, more on the left than on the right. There are foci of coronary artery calcification. Hepatobiliary: There is hepatic steatosis. No focal liver lesions are evident on this noncontrast enhanced study. The gallbladder appears mildly distended without wall thickening. There is no evident biliary duct dilatation. Pancreas: There is no pancreatic mass or inflammatory focus. Spleen: No splenic lesions are evident. Adrenals/Urinary Tract: Adrenals bilaterally appear normal. Left kidney appears mildly edematous with soft tissue stranding in the perinephric fascia on the left, similar to study from 6 months prior. There is no evident renal mass on either side. There is slight fullness of the left renal collecting system, similar to prior study. No evident hydronephrosis on the right. There is an extrarenal pelvis on each side, an anatomic variant. No renal or ureteral calculus is demonstrable on either side. Urinary bladder is midline with wall thickness within normal limits. Stomach/Bowel: There is no appreciable bowel wall or mesenteric thickening. There is fairly diffuse stool throughout most of the colon. No evident bowel obstruction. Terminal ileum appears unremarkable. There is no evident free air or portal venous air. Vascular/Lymphatic: There are foci of aortic and iliac artery atherosclerosis. No aneurysm evident. No adenopathy is appreciable in the abdomen or pelvis. Reproductive: Uterus is anteverted. There is no evident pelvic mass. Other: Appendix absent. No periappendiceal region inflammatory change. There is no abscess or ascites in the abdomen or pelvis. There is a rather minimal ventral hernia containing only fat. Musculoskeletal: There are no blastic or lytic bone lesions. There is no intramuscular lesion evident. IMPRESSION: 1. There remains a  degree of left renal edema with perinephric stranding and slight hydronephrosis. No obstructing focus evident in the left kidney or ureter. Specifically, no calculi evident. Question chronic recurrent pyelonephritis on the left. Recent ureteral calculus passage on the left could present similarly. The lack of calculi both on the previous study in currently makes infectious  etiology and more likely consideration. Appropriate clinical and laboratory assessment in this regard advised. 2. Apparent pneumonia in both posterior lung bases, more on the left than on the right. Small left pleural effusion noted. 3. No bowel obstruction. No abscess in the abdomen or pelvis. Appendix absent. Diffuse stool noted throughout much of the colon. 4. Hepatic steatosis. Gallbladder somewhat distended without wall thickening. 5. Aortic and iliac artery atherosclerosis. Foci of coronary artery calcification noted. 6.  Small ventral hernia containing only fat. ww Electronically Signed   By: Lowella Grip III M.D.   On: 04/26/2019 15:38    ____________________________________________   PROCEDURES  Procedure(s) performed (including Critical Care):  Procedures   ____________________________________________   INITIAL IMPRESSION / ASSESSMENT AND PLAN / ED COURSE  Patient with hyponatremia elevated white count bibasilar pneumonia and pyelonephritis.  We will test her for COVID and get her in the hospital.  Hyponatremia itself is enough to meet requirement for hospitalization.  She does not have allergies to antibiotics except for Levaquin and that is only GI upset so I will get her some Rocephin and Zithromax which should cover both well.            ____________________________________________   FINAL CLINICAL IMPRESSION(S) / ED DIAGNOSES  Final diagnoses:  Hyponatremia  Pyelonephritis  Community acquired pneumonia, unspecified laterality     ED Discharge Orders    None       Note:  This document  was prepared using Dragon voice recognition software and may include unintentional dictation errors.    Nena Polio, MD 04/26/19 [phone]

## 2019-04-26 NOTE — Progress Notes (Signed)
Family Meeting Note  Advance Directive:no  Today a meeting took place with the pt in the ER patient is being admitted for sepsis due to pyelonephritis and  pneumonia. She is somewhat dehydrated. Patient has uncontrolled diabetes and hypertension. Code status address with patient. She wishes to be full code. Time spent 16 minutes Fritzi Mandes, MD

## 2019-04-26 NOTE — ED Notes (Signed)
ED TO INPATIENT HANDOFF REPORT  ED Nurse Name and Phone #: Lurena JoinerRebecca [phone]  S Name/Age/Gender Emily Collins 53 y.o. female Room/Bed: ED17A/ED17A  Code Status   Code Status: Not on file  Home/SNF/Other Home Patient oriented to: self, place, time and situation Is this baseline? Yes   Triage Complete: Triage complete  Chief Complaint left side kidney pain  Triage Note Pt to ED via POV c/o left flank pain x 3 days. Pt states that she was seen by her PCP and given antibiotics but she is unsure what she was given. Pt states that her PCP told her if she was not better in 3 days to come to the hospital. Pt reports that she is also having epigastric pain. Pt denies diarrhea but states that she has been vomiting. Pt appears uncomfortable at this time.    Allergies Allergies  Allergen Reactions  . Atorvastatin     Muscle and join pains.  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 Levaquin  [Levofloxacin In D5w]     GI upset  . Simvastatin     joint aches.  . Dilaudid  [Hydromorphone Hcl] Hives, Itching and Rash  . Tetracycline Rash    Level of Care/Admitting Diagnosis ED Disposition    ED Disposition Condition Comment   Admit  Hospital [facility]: [facility] [[id number]]  Level of Care: Med-Surg [16]  Covid Evaluation: Asymptomatic Screening Protocol (No Symptoms)  Diagnosis: Sepsis (HCC) [1191478][1191708]  Admitting Physician: Joselyn GlassmanPATEL, SONA [[id number]]  Attending Physician: Joselyn GlassmanPATEL, SONA [[id number]]  Estimated length of stay: past midnight tomorrow  Certification:: I certify this patient will need inpatient services for at least 2 midnights  PT Class (Do Not Modify): Inpatient [101]  PT Acc Code (Do Not Modify): Private [1]       B Medical/Surgery History Past Medical History:  Diagnosis Date  . Anxiety   . Arthritis   . Depression   . Diabetes mellitus without complication (HCC)   . GERD (gastroesophageal reflux disease)   . History of chicken pox 04/07/2015   DID have Chicken Pox.    . Hyperlipidemia   .  Venous (peripheral) insufficiency    Past Surgical History:  Procedure Laterality Date  . APPENDECTOMY  1979  . BILATERAL CARPAL TUNNEL RELEASE Bilateral    Dr. Rosita KeaMenz  . CESAREAN SECTION  2003  . LAPAROSCOPIC LYSIS OF ADHESIONS    . SHOULDER SURGERY Right 08/2009   Dr. Rosita KeaMenz  . TUBAL LIGATION       A IV Location/Drains/Wounds Patient Lines/Drains/Airways Status   Active Line/Drains/Airways    Name:   Placement date:   Placement time:   Site:   Days:   Peripheral IV 04/26/19 Right Antecubital   04/26/19    1414    Antecubital   less than 1          Intake/Output Last 24 hours  Intake/Output Summary (Last 24 hours) at 04/26/2019 1956 Last data filed at 04/26/2019 1847 Gross per 24 hour  Intake 1000 ml  Output -  Net 1000 ml    Labs/Imaging Results for orders placed or performed during the hospital encounter of 04/26/19 (from the past 48 hour(s))  Basic metabolic panel     Status: Abnormal   Collection Time: 04/26/19 12:51 PM  Result Value Ref Range   Sodium 127 (L) 135 - 145 mmol/L   Potassium 5.4 (H) 3.5 - 5.1 mmol/L   Chloride 96 (L) 98 - 111 mmol/L   CO2 22 22 - 32 mmol/L   Glucose, Bld  409 (H) 70 - 99 mg/dL   BUN 36 (H) 6 - 20 mg/dL   Creatinine, Ser 4.091.69 (H) 0.44 - 1.00 mg/dL   Calcium 7.9 (L) 8.9 - 10.3 mg/dL   GFR calc non Af Amer 34 (L) >60 mL/min   GFR calc Af Amer 39 (L) >60 mL/min   Anion gap 9 5 - 15    Comment: Performed at [facility], [address] [place], [place] [address]  CBC     Status: Abnormal   Collection Time: 04/26/19 12:51 PM  Result Value Ref Range   WBC 15.3 (H) 4.0 - 10.5 K/uL   RBC 3.80 (L) 3.87 - 5.11 MIL/uL   Hemoglobin 10.5 (L) 12.0 - 15.0 g/dL   HCT 47.832.8 (L) 29.536.0 - 62.146.0 %   MCV 86.3 80.0 - 100.0 fL   MCH 27.6 26.0 - 34.0 pg   MCHC 32.0 30.0 - 36.0 g/dL   RDW 30.814.6 65.711.5 - 84.615.5 %   Platelets 254 150 - 400 K/uL   nRBC 0.0 0.0 - 0.2 %    Comment: Performed at [facility], [address] [place], [place]  [address]  Urinalysis, Complete w Microscopic     Status: Abnormal   Collection Time: 04/26/19  4:50 PM  Result Value Ref Range   Color, Urine YELLOW (A) YELLOW   APPearance CLEAR (A) CLEAR   Specific Gravity, Urine 1.013 1.005 - 1.030   pH 7.0 5.0 - 8.0   Glucose, UA >=500 (A) NEGATIVE mg/dL   Hgb urine dipstick MODERATE (A) NEGATIVE   Bilirubin Urine NEGATIVE NEGATIVE   Ketones, ur 5 (A) NEGATIVE mg/dL   Protein, ur NEGATIVE NEGATIVE mg/dL   Nitrite NEGATIVE NEGATIVE   Leukocytes,Ua SMALL (A) NEGATIVE   RBC / HPF 0-5 0 - 5 RBC/hpf   WBC, UA 11-20 0 - 5 WBC/hpf   Bacteria, UA RARE (A) NONE SEEN   Squamous Epithelial / LPF 0-5 0 - 5   Mucus PRESENT     Comment: Performed at [facility], [address] [place], [place] [address]  SARS Coronavirus 2 [facility](Hospital order, Performed in [facility] hospital lab) Nasopharyngeal     Status: None   Collection Time: 04/26/19  5:07 PM   Specimen: Nasopharyngeal  Result Value Ref Range   SARS Coronavirus 2 NEGATIVE NEGATIVE    Comment: (NOTE) If result is NEGATIVE SARS-CoV-2 target nucleic acids are NOT DETECTED. The SARS-CoV-2 RNA is generally detectable in upper and lower  respiratory specimens during the acute phase of infection. The lowest  concentration of SARS-CoV-2 viral copies this assay can detect is 250  copies / mL. A negative result does not preclude SARS-CoV-2 infection  and should not be used as the sole basis for treatment or other  patient management decisions.  A negative result may occur with  improper specimen collection / handling, submission of specimen other  than nasopharyngeal swab, presence of viral mutation(s) within the  areas targeted by this assay, and inadequate number of viral copies  (<250 copies / mL). A negative result must be combined with clinical  observations, patient history, and epidemiological information. If result is POSITIVE SARS-CoV-2 target nucleic acids are DETECTED. The SARS-CoV-2 RNA  is generally detectable in upper and lower  respiratory specimens dur ing the acute phase of infection.  Positive  results are indicative of active infection with SARS-CoV-2.  Clinical  correlation with patient history and other diagnostic information is  necessary to determine patient infection  status.  Positive results do  not rule out bacterial infection or co-infection with other viruses. If result is PRESUMPTIVE POSTIVE SARS-CoV-2 nucleic acids MAY BE PRESENT.   A presumptive positive result was obtained on the submitted specimen  and confirmed on repeat testing.  While 2019 novel coronavirus  (SARS-CoV-2) nucleic acids may be present in the submitted sample  additional confirmatory testing may be necessary for epidemiological  and / or clinical management purposes  to differentiate between  SARS-CoV-2 and other Sarbecovirus currently known to infect humans.  If clinically indicated additional testing with an alternate test  methodology [phone]) is advised. The SARS-CoV-2 RNA is generally  detectable in upper and lower respiratory sp ecimens during the acute  phase of infection. The expected result is Negative. Fact Sheet for Patients:  StrictlyIdeas.no Fact Sheet for Healthcare Providers: BankingDealers.co.za This test is not yet approved or cleared by the [place] FDA and has been authorized for detection and/or diagnosis of SARS-CoV-2 by FDA under an Emergency Use Authorization (EUA).  This EUA will remain in effect (meaning this test can be used) for the duration of the COVID-19 declaration under Section 564(b)(1) of the Act, 21 U.S.C. section 360bbb-3(b)(1), unless the authorization is terminated or revoked sooner. Performed at [facility], [address] [place], [place] [address]    Ct Renal Joaquim Lai Study  Result Date: 04/26/2019 CLINICAL DATA:  Left flank pain EXAM: CT ABDOMEN AND PELVIS WITHOUT CONTRAST  TECHNIQUE: Multidetector CT imaging of the abdomen and pelvis was performed following the standard protocol without oral or IV contrast. COMPARISON:  October 07, 2018 FINDINGS: Lower chest: There is a small left pleural effusion. There is consolidation in each posterior lung base, more on the left than on the right. There are foci of coronary artery calcification. Hepatobiliary: There is hepatic steatosis. No focal liver lesions are evident on this noncontrast enhanced study. The gallbladder appears mildly distended without wall thickening. There is no evident biliary duct dilatation. Pancreas: There is no pancreatic mass or inflammatory focus. Spleen: No splenic lesions are evident. Adrenals/Urinary Tract: Adrenals bilaterally appear normal. Left kidney appears mildly edematous with soft tissue stranding in the perinephric fascia on the left, similar to study from 6 months prior. There is no evident renal mass on either side. There is slight fullness of the left renal collecting system, similar to prior study. No evident hydronephrosis on the right. There is an extrarenal pelvis on each side, an anatomic variant. No renal or ureteral calculus is demonstrable on either side. Urinary bladder is midline with wall thickness within normal limits. Stomach/Bowel: There is no appreciable bowel wall or mesenteric thickening. There is fairly diffuse stool throughout most of the colon. No evident bowel obstruction. Terminal ileum appears unremarkable. There is no evident free air or portal venous air. Vascular/Lymphatic: There are foci of aortic and iliac artery atherosclerosis. No aneurysm evident. No adenopathy is appreciable in the abdomen or pelvis. Reproductive: Uterus is anteverted. There is no evident pelvic mass. Other: Appendix absent. No periappendiceal region inflammatory change. There is no abscess or ascites in the abdomen or pelvis. There is a rather minimal ventral hernia containing only fat. Musculoskeletal:  There are no blastic or lytic bone lesions. There is no intramuscular lesion evident. IMPRESSION: 1. There remains a degree of left renal edema with perinephric stranding and slight hydronephrosis. No obstructing focus evident in the left kidney or ureter. Specifically, no calculi evident. Question chronic recurrent pyelonephritis on the left. Recent ureteral calculus passage on  the left could present similarly. The lack of calculi both on the previous study in currently makes infectious etiology and more likely consideration. Appropriate clinical and laboratory assessment in this regard advised. 2. Apparent pneumonia in both posterior lung bases, more on the left than on the right. Small left pleural effusion noted. 3. No bowel obstruction. No abscess in the abdomen or pelvis. Appendix absent. Diffuse stool noted throughout much of the colon. 4. Hepatic steatosis. Gallbladder somewhat distended without wall thickening. 5. Aortic and iliac artery atherosclerosis. Foci of coronary artery calcification noted. 6.  Small ventral hernia containing only fat. ww Electronically Signed   By: Bretta BangWilliam  Woodruff III M.D.   On: 04/26/2019 15:38    Pending Labs Unresulted Labs (From admission, onward)    Start     Ordered   04/26/19 1710  Urine culture  Once,   STAT     04/26/19 1709   Signed and Held  CBC  (heparin)  Once,   R    Comments: Baseline for heparin therapy IF NOT ALREADY DRAWN.  Notify MD if PLT < 100 K.    Signed and Held   Signed and Held  Creatinine, serum  (heparin)  Once,   R    Comments: Baseline for heparin therapy IF NOT ALREADY DRAWN.    Signed and Held   Signed and Held  Urine culture  Once,   R     Signed and Held   Signed and Held  Basic metabolic panel  Tomorrow morning,   R     Signed and Held          Vitals/Pain Today's Vitals   04/26/19 1730 04/26/19 1745 04/26/19 1800 04/26/19 1838  BP: 137/68  129/65   Pulse: 88 91 91   SpO2: 97% 96% 97%   Weight:      Height:       PainSc:    9     Isolation Precautions No active isolations  Medications Medications  ondansetron (ZOFRAN) injection 4 mg (4 mg Intravenous Not Given 04/26/19 1849)  morphine 2 MG/ML injection 2 mg (2 mg Intravenous Given 04/26/19 1840)  diphenhydrAMINE (BENADRYL) capsule 25 mg (has no administration in time range)  insulin aspart (novoLOG) injection 0-5 Units (has no administration in time range)  insulin aspart (novoLOG) injection 0-15 Units (has no administration in time range)  fentaNYL (SUBLIMAZE) injection 50 mcg (50 mcg Intravenous Given 04/26/19 1605)  fentaNYL (SUBLIMAZE) injection 50 mcg (50 mcg Intravenous Given 04/26/19 1513)  ondansetron (ZOFRAN) injection 4 mg (4 mg Intravenous Given 04/26/19 1513)  sodium chloride 0.9 % bolus 1,000 mL (0 mLs Intravenous Stopped 04/26/19 1847)    Mobility walks Low fall risk   Focused Assessments    R Recommendations: See Admitting Provider Note  Report given to:   Additional Notes:

## 2019-04-27 LAB — BASIC METABOLIC PANEL
Anion gap: 7 (ref 5–15)
BUN: 31 mg/dL — ABNORMAL HIGH (ref 6–20)
CO2: 24 mmol/L (ref 22–32)
Calcium: 7.6 mg/dL — ABNORMAL LOW (ref 8.9–10.3)
Chloride: 102 mmol/L (ref 98–111)
Creatinine, Ser: 1.7 mg/dL — ABNORMAL HIGH (ref 0.44–1.00)
GFR calc Af Amer: 39 mL/min — ABNORMAL LOW (ref >60)
GFR calc non Af Amer: 34 mL/min — ABNORMAL LOW (ref >60)
Glucose, Bld: 214 mg/dL — ABNORMAL HIGH (ref 70–99)
Potassium: 4.1 mmol/L (ref 3.5–5.1)
Sodium: 133 mmol/L — ABNORMAL LOW (ref 135–145)

## 2019-04-27 LAB — GLUCOSE, CAPILLARY
Glucose-Capillary: 182 mg/dL — ABNORMAL HIGH (ref 70–99)
Glucose-Capillary: 242 mg/dL — ABNORMAL HIGH (ref 70–99)
Glucose-Capillary: 246 mg/dL — ABNORMAL HIGH (ref 70–99)
Glucose-Capillary: 281 mg/dL — ABNORMAL HIGH (ref 70–99)

## 2019-04-27 NOTE — Progress Notes (Signed)
[facility] [place] [facility]   PATIENT NAME: Emily FrederickLorie Collins    MR#:  [id number]  DATE OF BIRTH:  Jul 30, 1966  SUBJECTIVE:  CHIEF COMPLAINT:   Patient has nausea but tolerating diet and reporting left-sided flank pain and some burning sensation in the stomach  REVIEW OF SYSTEMS:  CONSTITUTIONAL: No fever, fatigue or weakness.  EYES: No blurred or double vision.  EARS, NOSE, AND THROAT: No tinnitus or ear pain.  RESPIRATORY: No cough, shortness of breath, wheezing or hemoptysis.  CARDIOVASCULAR: No chest pain, orthopnea, edema.  GASTROINTESTINAL: No nausea, vomiting, diarrhea or abdominal pain.  GENITOURINARY: No dysuria, hematuria.  ENDOCRINE: No polyuria, nocturia,  HEMATOLOGY: No anemia, easy bruising or bleeding SKIN: No rash or lesion. MUSCULOSKELETAL: No joint pain or arthritis.   NEUROLOGIC: No tingling, numbness, weakness.  PSYCHIATRY: No anxiety or depression.   DRUG ALLERGIES:   Allergies  Allergen Reactions  . Atorvastatin     Muscle and join pains.  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 Levaquin  [Levofloxacin In D5w]     GI upset  . Simvastatin     joint aches.  . Dilaudid  [Hydromorphone Hcl] Hives, Itching and Rash  . Tetracycline Rash    VITALS:  Blood pressure (!) 115/57, pulse 87, temperature 99.4 F (37.4 C), temperature source Oral, resp. rate 17, height 5\' 3"  (1.6 m), weight 99.7 kg, SpO2 93 %.  PHYSICAL EXAMINATION:  GENERAL:  53 y.o.-year-old patient lying in the bed with no acute distress.  EYES: Pupils equal, round, reactive to light and accommodation. No scleral icterus. Extraocular muscles intact.  HEENT: Head atraumatic, normocephalic. Oropharynx and nasopharynx clear.  NECK:  Supple, no jugular venous distention. No thyroid enlargement, no tenderness.  LUNGS: Normal breath sounds bilaterally, no wheezing, rales,rhonchi or crepitation. No use of accessory muscles of respiration.  CARDIOVASCULAR: S1, S2 normal. No murmurs, rubs, or gallops.   ABDOMEN: Soft, nontender, nondistended. Bowel sounds present.  EXTREMITIES: No pedal edema, cyanosis, or clubbing.  NEUROLOGIC: Cranial nerves II through XII are intact. Muscle strength 5/5 in all extremities. Sensation intact. Gait not checked.  PSYCHIATRIC: The patient is alert and oriented x 3.  SKIN: No obvious rash, lesion, or ulcer.    LABORATORY PANEL:   CBC Recent Labs  Lab 04/26/19 1251  WBC 15.3*  HGB 10.5*  HCT 32.8*  PLT 254   ------------------------------------------------------------------------------------------------------------------  Chemistries  Recent Labs  Lab 04/27/19 0638  NA 133*  K 4.1  CL 102  CO2 24  GLUCOSE 214*  BUN 31*  CREATININE 1.70*  CALCIUM 7.6*   ------------------------------------------------------------------------------------------------------------------  Cardiac Enzymes No results for input(s): TROPONINI in the last 168 hours. ------------------------------------------------------------------------------------------------------------------  RADIOLOGY:  Ct Renal Stone Study  Result Date: 04/26/2019 CLINICAL DATA:  Left flank pain EXAM: CT ABDOMEN AND PELVIS WITHOUT CONTRAST TECHNIQUE: Multidetector CT imaging of the abdomen and pelvis was performed following the standard protocol without oral or IV contrast. COMPARISON:  October 07, 2018 FINDINGS: Lower chest: There is a small left pleural effusion. There is consolidation in each posterior lung base, more on the left than on the right. There are foci of coronary artery calcification. Hepatobiliary: There is hepatic steatosis. No focal liver lesions are evident on this noncontrast enhanced study. The gallbladder appears mildly distended without wall thickening. There is no evident biliary duct dilatation. Pancreas: There is no pancreatic mass or inflammatory focus. Spleen: No splenic lesions are evident. Adrenals/Urinary Tract: Adrenals bilaterally appear normal. Left kidney appears  mildly edematous with soft tissue stranding in  the perinephric fascia on the left, similar to study from 6 months prior. There is no evident renal mass on either side. There is slight fullness of the left renal collecting system, similar to prior study. No evident hydronephrosis on the right. There is an extrarenal pelvis on each side, an anatomic variant. No renal or ureteral calculus is demonstrable on either side. Urinary bladder is midline with wall thickness within normal limits. Stomach/Bowel: There is no appreciable bowel wall or mesenteric thickening. There is fairly diffuse stool throughout most of the colon. No evident bowel obstruction. Terminal ileum appears unremarkable. There is no evident free air or portal venous air. Vascular/Lymphatic: There are foci of aortic and iliac artery atherosclerosis. No aneurysm evident. No adenopathy is appreciable in the abdomen or pelvis. Reproductive: Uterus is anteverted. There is no evident pelvic mass. Other: Appendix absent. No periappendiceal region inflammatory change. There is no abscess or ascites in the abdomen or pelvis. There is a rather minimal ventral hernia containing only fat. Musculoskeletal: There are no blastic or lytic bone lesions. There is no intramuscular lesion evident. IMPRESSION: 1. There remains a degree of left renal edema with perinephric stranding and slight hydronephrosis. No obstructing focus evident in the left kidney or ureter. Specifically, no calculi evident. Question chronic recurrent pyelonephritis on the left. Recent ureteral calculus passage on the left could present similarly. The lack of calculi both on the previous study in currently makes infectious etiology and more likely consideration. Appropriate clinical and laboratory assessment in this regard advised. 2. Apparent pneumonia in both posterior lung bases, more on the left than on the right. Small left pleural effusion noted. 3. No bowel obstruction. No abscess in the  abdomen or pelvis. Appendix absent. Diffuse stool noted throughout much of the colon. 4. Hepatic steatosis. Gallbladder somewhat distended without wall thickening. 5. Aortic and iliac artery atherosclerosis. Foci of coronary artery calcification noted. 6.  Small ventral hernia containing only fat. ww Electronically Signed   By: Lowella Grip III M.D.   On: 04/26/2019 15:38    EKG:  No orders found for this or any previous visit.  ASSESSMENT AND PLAN:   Emily Collins  is a 53 y.o. female with a known history of two diabetes, Jerrye Bushy, hyperlipidemia, anxiety comes to the emergency room with significant lower back pain more on the left and right along with nausea and vomiting. Patient does not report reckoning any temperature at home. She is quite uncomfortable in the ER.  1. Sepsis due to pyelonephritis left and pneumonia -Provide hydration with IV fluids - IV and PO PRN pain meds -IV Rocephin and Zithromax will be continued -follow-up urine culture and blood culture  2. Pneumonia-community acquired -treatment as above -incentive spirometer -Sputum culture and sensitivity if the patient can provide sample  3. Uncontrolled diabetes in the setting of sepsis -Lantus and sliding scale insulin  4. Acute on chronic renal failure stage II -IV fluids -monitor input output -avoid nephrotoxic agents  5. DVT prophylaxis subcu heparin     All the records are reviewed and case discussed with Care Management/Social Workerr. Management plans discussed with the patient, family and they are in agreement.  CODE STATUS: fc  TOTAL TIME TAKING CARE OF THIS PATIENT: 37 minutes.   POSSIBLE D/C IN 2 DAYS, DEPENDING ON CLINICAL CONDITION.  Note: This dictation was prepared with Dragon dictation along with smaller phrase technology. Any transcriptional errors that result from this process are unintentional.   Nicholes Mango M.D on 04/27/2019 at 4:16  PM  Between 7am to 6pm - Pager -  [phone] After 6pm go to [email or web address] - password [email or web address] [email or web address]  [facility]  Office  [phone]  CC: Primary care physician; Oswaldo ConroyBender, Abby Daneele, MD

## 2019-04-28 LAB — CBC
HCT: 27.5 % — ABNORMAL LOW (ref 36.0–46.0)
Hemoglobin: 8.8 g/dL — ABNORMAL LOW (ref 12.0–15.0)
MCH: 27.5 pg (ref 26.0–34.0)
MCHC: 32 g/dL (ref 30.0–36.0)
MCV: 85.9 fL (ref 80.0–100.0)
Platelets: 282 10*3/uL (ref 150–400)
RBC: 3.2 MIL/uL — ABNORMAL LOW (ref 3.87–5.11)
RDW: 14.8 % (ref 11.5–15.5)
WBC: 13 10*3/uL — ABNORMAL HIGH (ref 4.0–10.5)
nRBC: 0 % (ref 0.0–0.2)

## 2019-04-28 LAB — IRON AND TIBC
Iron: 25 ug/dL — ABNORMAL LOW (ref 28–170)
Saturation Ratios: 12 % (ref 10.4–31.8)
TIBC: 215 ug/dL — ABNORMAL LOW (ref 250–450)
UIBC: 190 ug/dL

## 2019-04-28 LAB — GLUCOSE, CAPILLARY
Glucose-Capillary: 112 mg/dL — ABNORMAL HIGH (ref 70–99)
Glucose-Capillary: 148 mg/dL — ABNORMAL HIGH (ref 70–99)
Glucose-Capillary: 189 mg/dL — ABNORMAL HIGH (ref 70–99)
Glucose-Capillary: 172 mg/dL — ABNORMAL HIGH (ref 70–99)

## 2019-04-28 LAB — URINE CULTURE: Culture: <10000 — AB

## 2019-04-28 LAB — BASIC METABOLIC PANEL
Anion gap: 7 (ref 5–15)
BUN: 25 mg/dL — ABNORMAL HIGH (ref 6–20)
CO2: 21 mmol/L — ABNORMAL LOW (ref 22–32)
Calcium: 7.3 mg/dL — ABNORMAL LOW (ref 8.9–10.3)
Chloride: 106 mmol/L (ref 98–111)
Creatinine, Ser: 1.48 mg/dL — ABNORMAL HIGH (ref 0.44–1.00)
GFR calc Af Amer: 46 mL/min — ABNORMAL LOW (ref >60)
GFR calc non Af Amer: 40 mL/min — ABNORMAL LOW (ref >60)
Glucose, Bld: 148 mg/dL — ABNORMAL HIGH (ref 70–99)
Potassium: 4.1 mmol/L (ref 3.5–5.1)
Sodium: 134 mmol/L — ABNORMAL LOW (ref 135–145)

## 2019-04-28 LAB — LIPASE, BLOOD: Lipase: <10 U/L — ABNORMAL LOW (ref 11–51)

## 2019-04-28 LAB — FERRITIN: Ferritin: 2102 ng/mL — ABNORMAL HIGH (ref 11–307)

## 2019-04-28 LAB — VITAMIN B12: Vitamin B-12: 2289 pg/mL — ABNORMAL HIGH (ref 180–914)

## 2019-04-28 LAB — PROCALCITONIN: Procalcitonin: 3.93 ng/mL

## 2019-04-28 LAB — FOLATE: Folate: 14.5 ng/mL (ref >5.9)

## 2019-04-28 MED ORDER — PANTOPRAZOLE SODIUM 40 MG PO TBEC
40.0000 mg | DELAYED_RELEASE_TABLET | Freq: Two times a day (BID) | ORAL | Status: DC
  Administered 2019-04-28 (×2): 40 mg via ORAL
  Filled 2019-04-28 (×2): qty 1

## 2019-04-28 NOTE — Progress Notes (Signed)
[facility] [place] [facility]   PATIENT NAME: Emily FrederickLorie Collins    MR#:  [id number]  DATE OF BIRTH:  Apr 16, 1966  SUBJECTIVE:   Patient states she is still not feeling well today.  She endorses severe "burning" pain across her upper abdomen.  No nausea or vomiting.  REVIEW OF SYSTEMS:  CONSTITUTIONAL: No fever, fatigue or weakness.  EYES: No blurred or double vision.  EARS, NOSE, AND THROAT: No tinnitus or ear pain.  RESPIRATORY: No cough, shortness of breath, wheezing or hemoptysis.  CARDIOVASCULAR: No chest pain, orthopnea, edema.  GASTROINTESTINAL: No nausea, vomiting, diarrhea. + Abdominal pain GENITOURINARY: No dysuria, hematuria.  ENDOCRINE: No polyuria, nocturia,  HEMATOLOGY: No anemia, easy bruising or bleeding SKIN: No rash or lesion. MUSCULOSKELETAL: No joint pain or arthritis.   NEUROLOGIC: No tingling, numbness, weakness.  PSYCHIATRY: No anxiety or depression.   DRUG ALLERGIES:   Allergies  Allergen Reactions  . Atorvastatin     Muscle and join pains.  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 Levaquin  [Levofloxacin In D5w]     GI upset  . Simvastatin     joint aches.  . Dilaudid  [Hydromorphone Hcl] Hives, Itching and Rash  . Tetracycline Rash    VITALS:  Blood pressure 130/62, pulse 86, temperature 98.4 F (36.9 C), temperature source Oral, resp. rate 20, height 5\' 3"  (1.6 m), weight 99.7 kg, SpO2 92 %.  PHYSICAL EXAMINATION:  GENERAL:  53 y.o.-year-old patient lying in the bed with no acute distress.  EYES: Pupils equal, round, reactive to light and accommodation. No scleral icterus. Extraocular muscles intact.  HEENT: Head atraumatic, normocephalic. Oropharynx and nasopharynx clear.  NECK:  Supple, no jugular venous distention. No thyroid enlargement, no tenderness.  LUNGS: Normal breath sounds bilaterally, no wheezing, rales,rhonchi or crepitation. No use of accessory muscles of respiration.  CARDIOVASCULAR: RRR, S1, S2 normal. No murmurs, rubs, or gallops.   ABDOMEN: Soft. Bowel sounds present. + Tenderness to palpation of the epigastric area. + Mild distention. EXTREMITIES: No pedal edema, cyanosis, or clubbing.  NEUROLOGIC: Cranial nerves II through XII are intact. Muscle strength 5/5 in all extremities. Sensation intact. Gait not checked.  PSYCHIATRIC: The patient is alert and oriented x 3.  SKIN: No obvious rash, lesion, or ulcer.    LABORATORY PANEL:   CBC Recent Labs  Lab 04/28/19 0510  WBC 13.0*  HGB 8.8*  HCT 27.5*  PLT 282   ------------------------------------------------------------------------------------------------------------------  Chemistries  Recent Labs  Lab 04/28/19 0510  NA 134*  K 4.1  CL 106  CO2 21*  GLUCOSE 148*  BUN 25*  CREATININE 1.48*  CALCIUM 7.3*   ------------------------------------------------------------------------------------------------------------------  Cardiac Enzymes No results for input(s): TROPONINI in the last 168 hours. ------------------------------------------------------------------------------------------------------------------  RADIOLOGY:  No results found.  EKG:  No orders found for this or any previous visit.  ASSESSMENT AND PLAN:   Sepsis- initially felt to be due to left pyelonephritis, but urine culture did not grow any bacteria.  CT renal stone study also did show some possible pneumonia in both posterior lung bases, but patient is not having any respiratory symptoms. -Blood cultures were not ordered on admission -Will order a right upper quadrant ultrasound to evaluate the gallbladder in closer detail -Continue IV fluids -Pain control -Continue ceftriaxone and azithromycin for now -Check procalcitonin  Epigastric abdominal pain- could be secondary to gastritis versus gallbladder pathology. -Check right upper quadrant ultrasound -Start Protonix -Check lipase  Type 2 diabetes -Lantus and sliding scale insulin  Acute renal failure in CKD  II-  creatinine improving  -Continue IV fluids -Avoid nephrotoxic agents  Normocytic anemia- patient denies any active bleeding. -Check anemia panel and FOBT  All the records are reviewed and case discussed with Care Management/Social Workerr. Management plans discussed with the patient, family and they are in agreement.  CODE STATUS: Full   TOTAL TIME TAKING CARE OF THIS PATIENT: 37 minutes.   POSSIBLE D/C IN 1-2 DAYS, DEPENDING ON CLINICAL CONDITION.  Note: This dictation was prepared with Dragon dictation along with smaller phrase technology. Any transcriptional errors that result from this process are unintentional.   Evette Doffing M.D on 04/28/2019 at 4:38 PM  Between 7am to 6pm - Pager - [phone]  After 6pm go to [email or web address] - password [email or web address] [place] [facility]  Office  [phone]  CC: Primary care physician; Letta Median, MD

## 2019-04-28 NOTE — Progress Notes (Signed)
Inpatient Diabetes Program Recommendations  AACE/ADA: New Consensus Statement on Inpatient Glycemic Control  Target Ranges:  Prepandial:   less than 140 mg/dL      Peak postprandial:   less than 180 mg/dL (1-2 hours)      Critically ill patients:  140 - 180 mg/dL   Results for Emily Collins, Emily Collins (MRN [id number]) as of 04/28/2019 10:51  Ref. Range 04/27/2019 08:09 04/27/2019 12:02 04/27/2019 17:04 04/27/2019 21:58 04/28/2019 07:29  Glucose-Capillary Latest Ref Range: 70 - 99 mg/dL 182 (H) 242 (H) 246 (H) 281 (H) 112 (H)    Review of Glycemic Control  Diabetes history: DM2 Outpatient Diabetes medications: Lantus 40-45 units BID, Humalog 5 units TID with meals Current orders for Inpatient glycemic control: Lantus 45 units QHS, Novolog 0-15 units TID with meals, Novolog 0-5 units QHS  Inpatient Diabetes Program Recommendations:   Insulin - Meal Coverage: Please consider ordering Novolog 4 units TID with meals for meal coverage if patient eats at least 50% of meals.  Thanks, Barnie Alderman, RN, MSN, CDE Diabetes Coordinator Inpatient Diabetes Program [phone] (Team Pager from 8am to 5pm)

## 2019-04-29 ENCOUNTER — Inpatient Hospital Stay: Payer: Medicaid Other

## 2019-04-29 LAB — BASIC METABOLIC PANEL
Anion gap: 6 (ref 5–15)
BUN: 20 mg/dL (ref 6–20)
CO2: 22 mmol/L (ref 22–32)
Calcium: 7.6 mg/dL — ABNORMAL LOW (ref 8.9–10.3)
Chloride: 108 mmol/L (ref 98–111)
Creatinine, Ser: 1.36 mg/dL — ABNORMAL HIGH (ref 0.44–1.00)
GFR calc Af Amer: 51 mL/min — ABNORMAL LOW (ref >60)
GFR calc non Af Amer: 44 mL/min — ABNORMAL LOW (ref >60)
Glucose, Bld: 67 mg/dL — ABNORMAL LOW (ref 70–99)
Potassium: 4.3 mmol/L (ref 3.5–5.1)
Sodium: 136 mmol/L (ref 135–145)

## 2019-04-29 LAB — GLUCOSE, CAPILLARY
Glucose-Capillary: 51 mg/dL — ABNORMAL LOW (ref 70–99)
Glucose-Capillary: 148 mg/dL — ABNORMAL HIGH (ref 70–99)
Glucose-Capillary: 153 mg/dL — ABNORMAL HIGH (ref 70–99)
Glucose-Capillary: 78 mg/dL (ref 70–99)
Glucose-Capillary: 64 mg/dL — ABNORMAL LOW (ref 70–99)
Glucose-Capillary: 279 mg/dL — ABNORMAL HIGH (ref 70–99)
Glucose-Capillary: 220 mg/dL — ABNORMAL HIGH (ref 70–99)

## 2019-04-29 LAB — CBC
HCT: 27.3 % — ABNORMAL LOW (ref 36.0–46.0)
Hemoglobin: 8.7 g/dL — ABNORMAL LOW (ref 12.0–15.0)
MCH: 27.4 pg (ref 26.0–34.0)
MCHC: 31.9 g/dL (ref 30.0–36.0)
MCV: 86.1 fL (ref 80.0–100.0)
Platelets: 352 10*3/uL (ref 150–400)
RBC: 3.17 MIL/uL — ABNORMAL LOW (ref 3.87–5.11)
RDW: 15.1 % (ref 11.5–15.5)
WBC: 13.8 10*3/uL — ABNORMAL HIGH (ref 4.0–10.5)
nRBC: 0 % (ref 0.0–0.2)

## 2019-04-29 LAB — PROCALCITONIN: Procalcitonin: 2.67 ng/mL

## 2019-04-29 LAB — TROPONIN I (HIGH SENSITIVITY): Troponin I (High Sensitivity): 20 ng/L — ABNORMAL HIGH (ref <18)

## 2019-04-29 MED ORDER — ALUM & MAG HYDROXIDE-SIMETH 200-200-20 MG/5ML PO SUSP
30.0000 mL | Freq: Four times a day (QID) | ORAL | Status: DC | PRN
  Administered 2019-04-30: 30 mL via ORAL
  Filled 2019-04-29: qty 30

## 2019-04-29 MED ORDER — SODIUM CHLORIDE 0.9% FLUSH: 10.0000 mL | INTRAVENOUS | Status: DC | PRN

## 2019-04-29 MED ORDER — POLYETHYLENE GLYCOL 3350 17 G PO PACK
17.0000 g | PACK | Freq: Every day | ORAL | Status: DC
  Administered 2019-04-29: 17 g via ORAL
  Filled 2019-04-29 (×5): qty 1

## 2019-04-29 MED ORDER — SODIUM CHLORIDE 0.9% FLUSH
10.0000 mL | Freq: Two times a day (BID) | INTRAVENOUS | Status: DC
  Administered 2019-04-30 – 2019-05-05 (×8): 10 mL

## 2019-04-29 MED ORDER — SENNOSIDES-DOCUSATE SODIUM 8.6-50 MG PO TABS
2.0000 | ORAL_TABLET | Freq: Two times a day (BID) | ORAL | Status: DC
  Administered 2019-04-29 – 2019-05-02 (×2): 2 via ORAL
  Filled 2019-04-29 (×11): qty 2

## 2019-04-29 MED ORDER — PANTOPRAZOLE SODIUM 40 MG IV SOLR
40.0000 mg | Freq: Two times a day (BID) | INTRAVENOUS | Status: DC
  Administered 2019-04-29 – 2019-05-02 (×7): 40 mg via INTRAVENOUS
  Filled 2019-04-29 (×7): qty 40

## 2019-04-29 MED ORDER — INSULIN GLARGINE 100 UNIT/ML ~~LOC~~ SOLN
40.0000 [IU] | Freq: Every day | SUBCUTANEOUS | Status: DC
  Administered 2019-04-29: 40 [IU] via SUBCUTANEOUS
  Filled 2019-04-29 (×2): qty 0.4

## 2019-04-29 MED ORDER — FLEET ENEMA 7-19 GM/118ML RE ENEM
1.0000 | ENEMA | Freq: Every day | RECTAL | Status: DC | PRN
  Administered 2019-05-03: 1 via RECTAL
  Filled 2019-04-29: qty 1

## 2019-04-29 MED ORDER — LIDOCAINE VISCOUS HCL 2 % MT SOLN
15.0000 mL | Freq: Four times a day (QID) | OROMUCOSAL | Status: DC | PRN
  Filled 2019-04-29: qty 15

## 2019-04-29 MED ORDER — DEXTROSE 50 % IV SOLN
1.0000 | Freq: Once | INTRAVENOUS | Status: AC
  Administered 2019-04-29: 50 mL via INTRAVENOUS

## 2019-04-29 MED ORDER — DEXTROSE 50 % IV SOLN
INTRAVENOUS | Status: AC
  Filled 2019-04-29: qty 50

## 2019-04-29 NOTE — Progress Notes (Signed)
Patient was complaining of being hot and wanted her blood sugar checked. Checked patient's blood sugar and it was 51. I gave her 67mL of D50 and encouraged patient to eat applesauce and graham crackers.  Rechecked blood sugar in a half hour and it was 148. Will continue to monitor.  Emily Collins  04/29/2019  5:50 AM

## 2019-04-29 NOTE — Progress Notes (Signed)
[facility] [place] [facility]   PATIENT NAME: Emily FrederickLorie Fitzgibbons    MR#:  [id number]  DATE OF BIRTH:  05-12-1966  SUBJECTIVE:   Patient is still not feeling well today.  She endorses continued "burning" epigastric abdominal pain.  She also has had decreased appetite.  Her last bowel movement was 2 days ago.  She denies any vomiting.  REVIEW OF SYSTEMS:  CONSTITUTIONAL: No fever, fatigue or weakness.  EYES: No blurred or double vision.  EARS, NOSE, AND THROAT: No tinnitus or ear pain.  RESPIRATORY: No cough, shortness of breath, wheezing or hemoptysis.  CARDIOVASCULAR: No chest pain, orthopnea, edema.  GASTROINTESTINAL: No nausea, vomiting, diarrhea. + Abdominal pain GENITOURINARY: No dysuria, hematuria.  ENDOCRINE: No polyuria, nocturia,  HEMATOLOGY: No anemia, easy bruising or bleeding SKIN: No rash or lesion. MUSCULOSKELETAL: No joint pain or arthritis.   NEUROLOGIC: No tingling, numbness, weakness.  PSYCHIATRY: No anxiety or depression.   DRUG ALLERGIES:   Allergies  Allergen Reactions  . Atorvastatin     Muscle and join pains.  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 Levaquin  [Levofloxacin In D5w]     GI upset  . Simvastatin     joint aches.  . Dilaudid  [Hydromorphone Hcl] Hives, Itching and Rash  . Tetracycline Rash    VITALS:  Blood pressure (!) 137/52, pulse 71, temperature 97.7 F (36.5 C), temperature source Oral, resp. rate 18, height 5\' 3"  (1.6 m), weight 99.7 kg, SpO2 96 %.  PHYSICAL EXAMINATION:  GENERAL:  53 y.o.-year-old patient lying in the bed with no acute distress.  EYES: Pupils equal, round, reactive to light and accommodation. No scleral icterus. Extraocular muscles intact.  HEENT: Head atraumatic, normocephalic. Oropharynx and nasopharynx clear.  NECK:  Supple, no jugular venous distention. No thyroid enlargement, no tenderness.  LUNGS: Normal breath sounds bilaterally, no wheezing, rales,rhonchi or crepitation. No use of accessory muscles of respiration.   CARDIOVASCULAR: RRR, S1, S2 normal. No murmurs, rubs, or gallops.  ABDOMEN: Soft. Bowel sounds present. + Tenderness to palpation of the epigastric area. + abdominal distention is present. EXTREMITIES: No pedal edema, cyanosis, or clubbing.  NEUROLOGIC: Cranial nerves II through XII are intact. Muscle strength 5/5 in all extremities. Sensation intact. Gait not checked.  PSYCHIATRIC: The patient is alert and oriented x 3.  SKIN: No obvious rash, lesion, or ulcer.    LABORATORY PANEL:   CBC Recent Labs  Lab 04/29/19 0411  WBC 13.8*  HGB 8.7*  HCT 27.3*  PLT 352   ------------------------------------------------------------------------------------------------------------------  Chemistries  Recent Labs  Lab 04/29/19 0411  NA 136  K 4.3  CL 108  CO2 22  GLUCOSE 67*  BUN 20  CREATININE 1.36*  CALCIUM 7.6*   ------------------------------------------------------------------------------------------------------------------  Cardiac Enzymes No results for input(s): TROPONINI in the last 168 hours. ------------------------------------------------------------------------------------------------------------------  RADIOLOGY:  Dg Abd 1 View  Result Date: 04/29/2019 CLINICAL DATA:  Upper abdominal pain, distention and bloating. EXAM: ABDOMEN - 1 VIEW COMPARISON:  CT abdomen and pelvis 04/26/2019. FINDINGS: Bowel gas pattern is nonobstructive. There is a large volume of stool throughout the colon, particularly the ascending and transverse. No unexpected abdominal calcification or focal bony abnormality. IMPRESSION: No acute finding. Large volume of stool ascending and transverse colon. Electronically Signed   By: Drusilla Kannerhomas  Dalessio M.D.   On: 04/29/2019 11:06    EKG:  No orders found for this or any previous visit.  ASSESSMENT AND PLAN:   Sepsis- initially felt to be due to left pyelonephritis, but urine culture did  not grow any bacteria.  CT renal stone study also did show some  possible pneumonia in both posterior lung bases, but patient is not having any respiratory symptoms. Sepsis has resolved. -Procalcitonin is elevated and is trending down -Blood cultures were not ordered on admission -Right upper quadrant ultrasound has been ordered to evaluate the gallbladder in closer detail -Continue IV fluids -Pain control -Continue ceftriaxone and azithromycin for now  Epigastric abdominal pain- could be secondary to gastritis versus gallbladder pathology.  Lipase normal. -Abdominal x-ray ordered this morning showed a large stool burden -Right upper quadrant ultrasound is pending -Continue Protonix, add GI cocktail prn -Check H. Pylori labs -Start stool softeners and enema prn  Type 2 diabetes- patient had an episode of hypoglycemia this morning -Decrease Lantus to 40 units nightly -Continue SSI  Acute renal failure in CKD II- creatinine improving  -Continue IV fluids -Avoid nephrotoxic agents  Normocytic anemia- patient denies any active bleeding. -Anemia panel unremarkable -FOBT pending  All the records are reviewed and case discussed with Care Management/Social Workerr. Management plans discussed with the patient, family and they are in agreement.  CODE STATUS: Full   TOTAL TIME TAKING CARE OF THIS PATIENT: 38 minutes.   POSSIBLE D/C IN 1-2 DAYS, DEPENDING ON CLINICAL CONDITION.  Note: This dictation was prepared with Dragon dictation along with smaller phrase technology. Any transcriptional errors that result from this process are unintentional.   Evette Doffing M.D on 04/29/2019 at 12:46 PM  Between 7am to 6pm - Pager - [phone]  After 6pm go to [email or web address] - password [email or web address] [place] [facility]  Office  [phone]  CC: Primary care physician; Letta Median, MD

## 2019-04-29 NOTE — Progress Notes (Signed)
Inpatient Diabetes Program Recommendations  AACE/ADA: New Consensus Statement on Inpatient Glycemic Control   Target Ranges:  Prepandial:   less than 140 mg/dL      Peak postprandial:   less than 180 mg/dL (1-2 hours)      Critically ill patients:  140 - 180 mg/dL   Results for Emily Collins, Emily Collins (MRN [id number]) as of 04/29/2019 08:09  Ref. Range 04/28/2019 07:29 04/28/2019 11:57 04/28/2019 16:53 04/28/2019 21:08 04/29/2019 04:46 04/29/2019 05:43 04/29/2019 07:36  Glucose-Capillary Latest Ref Range: 70 - 99 mg/dL 112 (H) 148 (H) 189 (H) 172 (H) 51 (L) 148 (H) 153 (H)   Review of Glycemic Control  Diabetes history: DM2 Outpatient Diabetes medications: Lantus 40-45 units BID, Humalog 5 units TID with meals Current orders for Inpatient glycemic control: Lantus 45 units QHS, Novolog 0-15 units TID with meals, Novolog 0-5 units QHS  Inpatient Diabetes Program Recommendations:   Insulin-Basal: Fasting glucose 51 mg/dl at 4:46 am today. Please consider decreasing Lantus to 40 units QHS.  Insulin - Meal Coverage: Please consider ordering Novolog 3 units TID with meals for meal coverage if patient eats at least 50% of meals.  Thanks, Barnie Alderman, RN, MSN, CDE Diabetes Coordinator Inpatient Diabetes Program [phone] (Team Pager from 8am to 5pm)

## 2019-04-30 ENCOUNTER — Inpatient Hospital Stay: Payer: Medicaid Other

## 2019-04-30 LAB — H PYLORI, IGM, IGG, IGA AB
H Pylori IgG: 0.6 Index Value (ref 0.00–0.79)
H. Pylogi, Iga Abs: <9 units (ref 0.0–8.9)
H. Pylogi, Igm Abs: <9 units (ref 0.0–8.9)

## 2019-04-30 LAB — BASIC METABOLIC PANEL
Anion gap: 10 (ref 5–15)
BUN: 18 mg/dL (ref 6–20)
CO2: 21 mmol/L — ABNORMAL LOW (ref 22–32)
Calcium: 7.9 mg/dL — ABNORMAL LOW (ref 8.9–10.3)
Chloride: 105 mmol/L (ref 98–111)
Creatinine, Ser: 1.26 mg/dL — ABNORMAL HIGH (ref 0.44–1.00)
GFR calc Af Amer: 56 mL/min — ABNORMAL LOW (ref >60)
GFR calc non Af Amer: 49 mL/min — ABNORMAL LOW (ref >60)
Glucose, Bld: 71 mg/dL (ref 70–99)
Potassium: 4.1 mmol/L (ref 3.5–5.1)
Sodium: 136 mmol/L (ref 135–145)

## 2019-04-30 LAB — GLUCOSE, CAPILLARY
Glucose-Capillary: 65 mg/dL — ABNORMAL LOW (ref 70–99)
Glucose-Capillary: 89 mg/dL (ref 70–99)
Glucose-Capillary: 225 mg/dL — ABNORMAL HIGH (ref 70–99)
Glucose-Capillary: 283 mg/dL — ABNORMAL HIGH (ref 70–99)
Glucose-Capillary: 74 mg/dL (ref 70–99)
Glucose-Capillary: 218 mg/dL — ABNORMAL HIGH (ref 70–99)

## 2019-04-30 LAB — CBC
HCT: 27.6 % — ABNORMAL LOW (ref 36.0–46.0)
Hemoglobin: 8.9 g/dL — ABNORMAL LOW (ref 12.0–15.0)
MCH: 28 pg (ref 26.0–34.0)
MCHC: 32.2 g/dL (ref 30.0–36.0)
MCV: 86.8 fL (ref 80.0–100.0)
Platelets: 435 10*3/uL — ABNORMAL HIGH (ref 150–400)
RBC: 3.18 MIL/uL — ABNORMAL LOW (ref 3.87–5.11)
RDW: 15.3 % (ref 11.5–15.5)
WBC: 17.9 10*3/uL — ABNORMAL HIGH (ref 4.0–10.5)
nRBC: 0.1 % (ref 0.0–0.2)

## 2019-04-30 LAB — LIPASE, BLOOD: Lipase: 14 U/L (ref 11–51)

## 2019-04-30 LAB — OCCULT BLOOD X 1 CARD TO LAB, STOOL: Fecal Occult Bld: NEGATIVE

## 2019-04-30 MED ORDER — INSULIN ASPART 100 UNIT/ML ~~LOC~~ SOLN: 3.0000 [IU] | Freq: Three times a day (TID) | SUBCUTANEOUS | Status: DC

## 2019-04-30 MED ORDER — VANCOMYCIN HCL IN DEXTROSE 1-5 GM/200ML-% IV SOLN
1000.0000 mg | INTRAVENOUS | Status: DC
  Filled 2019-04-30: qty 200

## 2019-04-30 MED ORDER — PIPERACILLIN-TAZOBACTAM 3.375 G IVPB
3.3750 g | Freq: Three times a day (TID) | INTRAVENOUS | Status: DC
  Administered 2019-04-30 – 2019-05-06 (×18): 3.375 g via INTRAVENOUS
  Filled 2019-04-30 (×19): qty 50

## 2019-04-30 MED ORDER — INSULIN GLARGINE 100 UNIT/ML ~~LOC~~ SOLN
18.0000 [IU] | Freq: Every day | SUBCUTANEOUS | Status: DC
  Administered 2019-04-30: 18 [IU] via SUBCUTANEOUS
  Filled 2019-04-30 (×2): qty 0.18

## 2019-04-30 MED ORDER — VANCOMYCIN HCL 10 G IV SOLR
2000.0000 mg | Freq: Once | INTRAVENOUS | Status: AC
  Administered 2019-04-30: 2000 mg via INTRAVENOUS
  Filled 2019-04-30: qty 2000

## 2019-04-30 MED ORDER — INSULIN GLARGINE 100 UNIT/ML ~~LOC~~ SOLN
37.0000 [IU] | Freq: Every day | SUBCUTANEOUS | Status: DC
  Filled 2019-04-30: qty 0.37

## 2019-04-30 NOTE — Progress Notes (Signed)
Inpatient Diabetes Program Recommendations  AACE/ADA: New Consensus Statement on Inpatient Glycemic Control   Target Ranges:  Prepandial:   less than 140 mg/dL      Peak postprandial:   less than 180 mg/dL (1-2 hours)      Critically ill patients:  140 - 180 mg/dL   Results for Emily Collins, Emily Collins (MRN [id number]) as of 04/30/2019 08:21  Ref. Range 04/29/2019 07:36 04/29/2019 11:41 04/29/2019 13:02 04/29/2019 17:01 04/29/2019 21:25 04/30/2019 05:24 04/30/2019 05:52 04/30/2019 07:41  Glucose-Capillary Latest Ref Range: 70 - 99 mg/dL 153 (H) 78 64 (L) 279 (H) 220 (H) 65 (L) 89 225 (H)    Review of Glycemic Control  Diabetes history:DM2 Outpatient Diabetes medications:Lantus 40-45 units BID, Humalog 5 units TID with meals Current orders for Inpatient glycemic control:Lantus 40 units QHS, Novolog 0-15 units TID with meals, Novolog 0-5 units QHS  Inpatient Diabetes Program Recommendations:  Insulin-Basal: Fasting glucose 65 mg/dl at 5:24 am today. Please consider decreasing Lantus to 37 units QHS.  Insulin - Meal Coverage: Please consider ordering Novolog3units TID with meals for meal coverage if patient eats at least 50% of meals.  Thanks, Barnie Alderman, RN, MSN, CDE Diabetes Coordinator Inpatient Diabetes Program [phone] (Team Pager from 8am to 5pm)

## 2019-04-30 NOTE — Progress Notes (Signed)
Per MD okay for RN to place order to check lipase levels.

## 2019-04-30 NOTE — Progress Notes (Signed)
Due to n.p.o. status after midnight for HIDA scan we will decrease the dose of Lantus to half.

## 2019-04-30 NOTE — Progress Notes (Signed)
[facility] [place] [facility]   PATIENT NAME: Ruffin FrederickLorie Mateja    MR#:  [id number]  DATE OF BIRTH:  August 24, 1966  SUBJECTIVE:  Appearsuncomfortable secondary to severe abdominal pain, patient has right upper quadrant, epigastric pain around 8 out of 10 in severity but not radiating to the back, no nausea or vomiting, tolerating the diet, patient white count is elevated, ultrasound of gallbladder is negative for stones, lipase is normal.  Abdomen is distended, bowel sounds are poor  REVIEW OF SYSTEMS:  CONSTITUTIONAL: No fever, fatigue or weakness.  EYES: No blurred or double vision.  EARS, NOSE, AND THROAT: No tinnitus or ear pain.  RESPIRATORY: No cough, shortness of breath, wheezing or hemoptysis.  CARDIOVASCULAR: No chest pain, orthopnea, edema.  GASTROINTESTINAL: No nausea, vomiting, diarrhea. + Abdominal pain GENITOURINARY: No dysuria, hematuria.  ENDOCRINE: No polyuria, nocturia,  HEMATOLOGY: No anemia, easy bruising or bleeding SKIN: No rash or lesion. MUSCULOSKELETAL: No joint pain or arthritis.   NEUROLOGIC: No tingling, numbness, weakness.  PSYCHIATRY: No anxiety or depression.   DRUG ALLERGIES:   Allergies  Allergen Reactions  . Atorvastatin     Muscle and join pains.  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 Levaquin  [Levofloxacin In D5w]     GI upset  . Simvastatin     joint aches.  . Dilaudid  [Hydromorphone Hcl] Hives, Itching and Rash  . Tetracycline Rash    VITALS:  Blood pressure 136/66, pulse 93, temperature 98.2 F (36.8 C), temperature source Oral, resp. rate 20, height 5\' 3"  (1.6 m), weight 99.7 kg, SpO2 98 %.  PHYSICAL EXAMINATION:  GENERAL:  53 y.o.-year-old patient lying in the bed with no acute distress.  EYES: Pupils equal, round, reactive to light and accommodation. No scleral icterus. Extraocular muscles intact.  HEENT: Head atraumatic, normocephalic. Oropharynx and nasopharynx clear.  NECK:  Supple, no jugular venous distention. No thyroid enlargement,  no tenderness.  LUNGS: Normal breath sounds bilaterally, no wheezing, rales,rhonchi or crepitation. No use of accessory muscles of respiration.  CARDIOVASCULAR: RRR, S1, S2 normal. No murmurs, rubs, or gallops.  ABDOMEN: Sdistended, bowel sounds diminished, tenderness in the right upper quadrant, epigastric area.  EXTREMITIES: No pedal edema, cyanosis, or clubbing.  NEUROLOGIC: Cranial nerves II through XII are intact. Muscle strength 5/5 in all extremities. Sensation intact. Gait not checked.  PSYCHIATRIC: The patient is alert and oriented x 3.  SKIN: No obvious rash, lesion, or ulcer.    LABORATORY PANEL:   CBC Recent Labs  Lab 04/30/19 0519  WBC 17.9*  HGB 8.9*  HCT 27.6*  PLT 435*   ------------------------------------------------------------------------------------------------------------------  Chemistries  Recent Labs  Lab 04/30/19 0519  NA 136  K 4.1  CL 105  CO2 21*  GLUCOSE 71  BUN 18  CREATININE 1.26*  CALCIUM 7.9*   ------------------------------------------------------------------------------------------------------------------  Cardiac Enzymes No results for input(s): TROPONINI in the last 168 hours. ------------------------------------------------------------------------------------------------------------------  RADIOLOGY:  Dg Abd 1 View  Result Date: 04/29/2019 CLINICAL DATA:  Upper abdominal pain, distention and bloating. EXAM: ABDOMEN - 1 VIEW COMPARISON:  CT abdomen and pelvis 04/26/2019. FINDINGS: Bowel gas pattern is nonobstructive. There is a large volume of stool throughout the colon, particularly the ascending and transverse. No unexpected abdominal calcification or focal bony abnormality. IMPRESSION: No acute finding. Large volume of stool ascending and transverse colon. Electronically Signed   By: Drusilla Kannerhomas  Dalessio M.D.   On: 04/29/2019 11:06   [place] Abdomen Limited Ruq  Result Date: 04/30/2019 CLINICAL DATA:  Epigastric pain EXAM: ULTRASOUND  ABDOMEN  LIMITED RIGHT UPPER QUADRANT COMPARISON:  CT AP 04/26/2019 FINDINGS: Gallbladder: No gallstones or wall thickening visualized. No sonographic Murphy sign noted by sonographer. Common bile duct: Diameter: 2.3 mm Liver: No focal lesion identified. Within normal limits in parenchymal echogenicity. Portal vein is patent on color Doppler imaging with normal direction of blood flow towards the liver. Other: None. IMPRESSION: 1. Normal exam. please pick the correct US ABDOMEN LIMITED template. Electronically Signed   By: Kerby Moors M.D.   On: 04/30/2019 12:39    EKG:  No orders found for this or any previous visit.  ASSESSMENT AND PLAN:   Sepsis- initially felt to be due to left pyelonephritis, but urine culture did not grow any bacteria.  CT renal stone study also did show some possible pneumonia in both posterior lung bases, but patient is not having any respiratory symptoms. Sepsis has resolved. -Procalcitonin is elevated and is trending down -Blood cultures were not ordered on admission -Right upper quadrant ultrasound has been ordered to evaluate the gallbladder in closer detail, right upper quadrant ultrasound did not show any gallbladder stones. -Continue IV fluids -Pain control -we will change antibiotics to vancomycin, Zosyn for broader coverage secondary to in worsening leukocytosis   possible acute gastritis, continue PPIs, ordered HIDA scan for possible blocked gallbladder dyskinesia, -- Type 2 diabetes- patient had an episode of hypoglycemia this morning -Decrease the Lantus to 37 units, added mealtime 40 units in the morning, 40 units at night.  Acute renal failure in CKD II- creatinine improving  -Continue IV fluids but decrease the rate. -Avoid nephrotoxic agents  Normocytic anemia- patient denies any active bleeding. -Anemia panel unremarkable Fecal occult blood test is negative.  All the records are reviewed and case discussed with Care Management/Social  Workerr. Management plans discussed with the patient, family and they are in agreement.  CODE STATUS: Full   TOTAL TIME TAKING CARE OF THIS PATIENT: 38 minutes.  More than 50% time spent in counseling, coordination of care POSSIBLE D/C IN 1-2 DAYS, DEPENDING ON CLINICAL CONDITION.  Note: This dictation was prepared with Dragon dictation along with smaller phrase technology. Any transcriptional errors that result from this process are unintentional.   Epifanio Lesches M.D on 04/30/2019 at 3:11 PM  Between 7am to 6pm - Pager - [phone]  After 6pm go to [email or web address] - password [email or web address] [place] [facility]  Office  [phone]  CC: Primary care physician; Letta Median, MD

## 2019-04-30 NOTE — Progress Notes (Signed)
Hypoglycemic Event  CBG: 65  Treatment: 4 oz juice/soda  Symptoms: Blurred vision  Follow-up CBG: [id number] CBG  Result:89  Possible Reasons for Event: Unknown  Comments/MD notified: yes    Emily Collins

## 2019-04-30 NOTE — Consult Note (Addendum)
Pharmacy Antibiotic Note  Emily Collins is a 53 y.o. female admitted on 04/26/2019 with Intra-abdominal pain and PNA.Marland Kitchen  Pharmacy has been consulted for pip/tazo and vancomycin dosing.  Plan: Will start pip/tazo 3.375 mg q8H extended interval.   Will give vancomycin 2000 mg x 1 loading dose. Maintenance dose on vancomycin 1000 mg q24H. Predicted AUC 503. Goal AUC 400-550. Css min 13. Scr 1.26. Will order MRSA PCR, if negative recommend to d/c vancomycin. Plan to order levels in 4-5 days.   Height: 5\' 3"  (160 cm) Weight: 219 lb 12.8 oz (99.7 kg) IBW/kg (Calculated) : 52.4  Temp (24hrs), Avg:98.3 F (36.8 C), Min:98.2 F (36.8 C), Max:98.4 F (36.9 C)  Recent Labs  Lab 04/26/19 1251 04/27/19 0638 04/28/19 0510 04/29/19 0411 04/30/19 0519  WBC 15.3*  --  13.0* 13.8* 17.9*  CREATININE 1.69* 1.70* 1.48* 1.36* 1.26*    Estimated Creatinine Clearance: 58.1 mL/min (A) (by C-G formula based on SCr of 1.26 mg/dL (H)).    Allergies  Allergen Reactions  . Atorvastatin     Muscle and join pains.  Mack Hook  [Levofloxacin In D5w]     GI upset  . Simvastatin     joint aches.  . Dilaudid  [Hydromorphone Hcl] Hives, Itching and Rash  . Tetracycline Rash    Antimicrobials this admission: 8/1 ceftriaxone >> 8/5 8/2 azithromycin >> 8/5 8/5 pip/tazo >>  8/5 vancomycin >.   Dose adjustments this admission: None  Microbiology results: None  8/5 MRSA PCR ordered.   Thank you for allowing pharmacy to be a part of this patient's care.  Oswald Hillock 04/30/2019 3:25 PM

## 2019-05-01 ENCOUNTER — Encounter: Payer: Self-pay | Admitting: Radiology

## 2019-05-01 ENCOUNTER — Inpatient Hospital Stay: Payer: Medicaid Other

## 2019-05-01 LAB — CBC
HCT: 25.3 % — ABNORMAL LOW (ref 36.0–46.0)
Hemoglobin: 8.2 g/dL — ABNORMAL LOW (ref 12.0–15.0)
MCH: 27.9 pg (ref 26.0–34.0)
MCHC: 32.4 g/dL (ref 30.0–36.0)
MCV: 86.1 fL (ref 80.0–100.0)
Platelets: 478 10*3/uL — ABNORMAL HIGH (ref 150–400)
RBC: 2.94 MIL/uL — ABNORMAL LOW (ref 3.87–5.11)
RDW: 15 % (ref 11.5–15.5)
WBC: 20.9 10*3/uL — ABNORMAL HIGH (ref 4.0–10.5)
nRBC: 0 % (ref 0.0–0.2)

## 2019-05-01 LAB — GLUCOSE, CAPILLARY
Glucose-Capillary: 427 mg/dL — ABNORMAL HIGH (ref 70–99)
Glucose-Capillary: 451 mg/dL — ABNORMAL HIGH (ref 70–99)
Glucose-Capillary: 330 mg/dL — ABNORMAL HIGH (ref 70–99)
Glucose-Capillary: 259 mg/dL — ABNORMAL HIGH (ref 70–99)

## 2019-05-01 LAB — MRSA PCR SCREENING: MRSA by PCR: NEGATIVE

## 2019-05-01 LAB — CREATININE, SERUM
Creatinine, Ser: 1.29 mg/dL — ABNORMAL HIGH (ref 0.44–1.00)
GFR calc Af Amer: 55 mL/min — ABNORMAL LOW (ref >60)
GFR calc non Af Amer: 47 mL/min — ABNORMAL LOW (ref >60)

## 2019-05-01 LAB — EXPECTORATED SPUTUM ASSESSMENT W GRAM STAIN, RFLX TO RESP C: Special Requests: NORMAL

## 2019-05-01 MED ORDER — INSULIN GLARGINE 100 UNIT/ML ~~LOC~~ SOLN
35.0000 [IU] | Freq: Every day | SUBCUTANEOUS | Status: DC
  Administered 2019-05-01 – 2019-05-04 (×4): 35 [IU] via SUBCUTANEOUS
  Filled 2019-05-01 (×5): qty 0.35

## 2019-05-01 MED ORDER — SODIUM CHLORIDE 0.9 % IV SOLN
INTRAVENOUS | Status: DC | PRN
  Administered 2019-05-01: 30 mL via INTRAVENOUS
  Administered 2019-05-02 – 2019-05-04 (×2): 1000 mL via INTRAVENOUS

## 2019-05-01 MED ORDER — TECHNETIUM TC 99M MEBROFENIN IV KIT
5.2480 | PACK | Freq: Once | INTRAVENOUS | Status: AC | PRN
  Administered 2019-05-01: 5.248 via INTRAVENOUS

## 2019-05-01 MED ORDER — INSULIN ASPART 100 UNIT/ML ~~LOC~~ SOLN
5.0000 [IU] | Freq: Three times a day (TID) | SUBCUTANEOUS | Status: DC
  Administered 2019-05-01: 5 [IU] via SUBCUTANEOUS
  Filled 2019-05-01: qty 1

## 2019-05-01 MED ORDER — IOHEXOL 300 MG/ML  SOLN
100.0000 mL | Freq: Once | INTRAMUSCULAR | Status: AC | PRN
  Administered 2019-05-01: 100 mL via INTRAVENOUS

## 2019-05-01 MED ORDER — INSULIN ASPART 100 UNIT/ML ~~LOC~~ SOLN
18.0000 [IU] | Freq: Once | SUBCUTANEOUS | Status: AC
  Administered 2019-05-01: 18 [IU] via SUBCUTANEOUS
  Filled 2019-05-01: qty 1

## 2019-05-01 MED ORDER — IOHEXOL 240 MG/ML SOLN
25.0000 mL | INTRAMUSCULAR | Status: AC
  Administered 2019-05-01 (×2): 25 mL via ORAL

## 2019-05-01 NOTE — Progress Notes (Signed)
[facility] [place] [facility]   PATIENT NAME: Emily Collins    MR#:  [id number]  DATE OF BIRTH:  03-14-1966  SUBJECTIVE:  Still has abdominal distention and has considerable abdominal pain, able to eat, pass gas, HIDA scan negative for any gallbladder dyskinesia.  WBC up to 20.9, REVIEW OF SYSTEMS:  CONSTITUTIONAL: No fever, fatigue or weakness.  EYES: No blurred or double vision.  EARS, NOSE, AND THROAT: No tinnitus or ear pain.  RESPIRATORY: No cough, shortness of breath, wheezing or hemoptysis.  CARDIOVASCULAR: No chest pain, orthopnea, edema.  GASTROINTESTINAL: Abdominal distention, severe abdominal pain mainly in the right and left upper quadrants. GENITOURINARY: No dysuria, hematuria.  ENDOCRINE: No polyuria, nocturia,  HEMATOLOGY: No anemia, easy bruising or bleeding SKIN: No rash or lesion. MUSCULOSKELETAL: No joint pain or arthritis.   NEUROLOGIC: No tingling, numbness, weakness.  PSYCHIATRY: No anxiety or depression.   DRUG ALLERGIES:   Allergies  Allergen Reactions  . Atorvastatin     Muscle and join pains.  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 Levaquin  [Levofloxacin In D5w]     GI upset  . Simvastatin     joint aches.  . Dilaudid  [Hydromorphone Hcl] Hives, Itching and Rash  . Tetracycline Rash    VITALS:  Blood pressure 132/60, pulse 78, temperature 97.9 F (36.6 C), temperature source Oral, resp. rate 20, height 5\' 3"  (1.6 m), weight 99.7 kg, SpO2 94 %.  PHYSICAL EXAMINATION:  GENERAL:  53 y.o.-year-old patient lying in the bed with no acute distress.  EYES: Pupils equal, round, reactive to light and accommodation. No scleral icterus. Extraocular muscles intact.  HEENT: Head atraumatic, normocephalic. Oropharynx and nasopharynx clear.  NECK:  Supple, no jugular venous distention. No thyroid enlargement, no tenderness.  LUNGS: Normal breath sounds bilaterally, no wheezing, rales,rhonchi or crepitation. No use of accessory muscles of respiration.   CARDIOVASCULAR: RRR, S1, S2 normal. No murmurs, rubs, or gallops.  ABDOMEN: Abdominal distention, diminished bowel sounds, right upper quadrant, left upper quadrant tenderness present.  Marland Kitchen.  EXTREMITIES: No pedal edema, cyanosis, or clubbing.  NEUROLOGIC: Cranial nerves II through XII are intact. Muscle strength 5/5 in all extremities. Sensation intact. Gait not checked.  PSYCHIATRIC: The patient is alert and oriented x 3.  SKIN: No obvious rash, lesion, or ulcer.    LABORATORY PANEL:   CBC Recent Labs  Lab 05/01/19 0650  WBC 20.9*  HGB 8.2*  HCT 25.3*  PLT 478*   ------------------------------------------------------------------------------------------------------------------  Chemistries  Recent Labs  Lab 04/30/19 0519 05/01/19 0650  NA 136  --   K 4.1  --   CL 105  --   CO2 21*  --   GLUCOSE 71  --   BUN 18  --   CREATININE 1.26* 1.29*  CALCIUM 7.9*  --    ------------------------------------------------------------------------------------------------------------------  Cardiac Enzymes No results for input(s): TROPONINI in the last 168 hours. ------------------------------------------------------------------------------------------------------------------  RADIOLOGY:  Dg Abd 1 View  Result Date: 04/30/2019 CLINICAL DATA:  Umbilical and epigastric abdominal pain EXAM: ABDOMEN - 1 VIEW COMPARISON:  04/29/2019 FINDINGS: Nonobstructed bowel gas pattern with large amount of stool in the colon. Similar stool burden as compared to prior. No radiopaque calculi. IMPRESSION: Nonobstructed gas pattern with large amount of stool in the colon Electronically Signed   By: Jasmine PangKim  Fujinaga M.D.   On: 04/30/2019 15:53   Nm Hepatobiliary Liver Func  Addendum Date: 05/01/2019   ADDENDUM REPORT: 05/01/2019 13:34 ADDENDUM: Note that the gallbladder visualizes significantly later than the small bowel.  This finding does indicate patency of the cystic duct. However, it may well be indicative  of a degree of chronic cholecystitis. Electronically Signed   By: Lowella Grip III M.D.   On: 05/01/2019 13:34   Result Date: 05/01/2019 CLINICAL DATA:  Upper abdominal pain EXAM: NUCLEAR MEDICINE HEPATOBILIARY IMAGING TECHNIQUE: Sequential images of the abdomen were obtained out to 60 minutes following intravenous administration of radiopharmaceutical. RADIOPHARMACEUTICALS:  5.248 mCi Tc-46m mebrofenin IV COMPARISON:  Ultrasound right upper quadrant April 29, 2019 FINDINGS: Prompt uptake and biliary excretion of activity by the liver is seen. Gallbladder activity is visualized, consistent with patency of cystic duct. Biliary activity passes into small bowel, consistent with patent common bile duct. IMPRESSION: Study within normal limits. Electronically Signed: By: Lowella Grip III M.D. On: 05/01/2019 09:18    EKG:  No orders found for this or any previous visit.  ASSESSMENT AND PLAN:   Sepsis-felt to be secondary to pyelonephritis but now has severe abdominal pain, elevated white count, antibiotics are broadened, repeat CT of abdomen today with contrast, HIDA scan negative for any gallbladder stones.  Continue IV antibiotics, IV PPIs, repeat CT abdomen ordered, spoke with Dr. Marius Ditch the gastroenterologist.    Type 2 diabetes-uncontrolled, we had to decrease the dose of Lantus yesterday due to hypoglycemia now has hyperglycemia, increase Lantus to 35 units nightly,  increase mealtime coverage to 5 units 3 times daily with meals.   Acute renal failure in CKD II- creatinine improving  -Continue IV fluids but decrease the rate. -Avoid nephrotoxic agents  Normocytic anemia- patient denies any active bleeding. -Anemia panel unremarkable Fecal occult blood test is negative.    All the records are reviewed and case discussed with Care Management/Social Workerr. Management plans discussed with the patient, family and they are in agreement.  CODE STATUS: Full   TOTAL TIME TAKING CARE OF  THIS PATIENT: 38 minutes.  More than 50% time spent in counseling, coordination of care POSSIBLE D/C IN 1-2 DAYS, DEPENDING ON CLINICAL CONDITION.  Note: This dictation was prepared with Dragon dictation along with smaller phrase technology. Any transcriptional errors that result from this process are unintentional.   Epifanio Lesches M.D on 05/01/2019 at 1:58 PM  Between 7am to 6pm - Pager - [phone]  After 6pm go to [email or web address] - password [email or web address] [facility]  Office  [phone]  CC: Primary care physician; Letta Median, MD

## 2019-05-01 NOTE — Progress Notes (Signed)
Inpatient Diabetes Program Recommendations  AACE/ADA: New Consensus Statement on Inpatient Glycemic Control (2015)  Target Ranges:  Prepandial:   less than 140 mg/dL      Peak postprandial:   less than 180 mg/dL (1-2 hours)      Critically ill patients:  140 - 180 mg/dL   Results for Emily Collins, Emily Collins (MRN [id number]) as of 05/01/2019 12:50  Ref. Range 04/30/2019 05:24 04/30/2019 05:52 04/30/2019 07:41 04/30/2019 11:47 04/30/2019 16:40 04/30/2019 21:11  Glucose-Capillary Latest Ref Range: 70 - 99 mg/dL 65 (L) 89 225 (H)  5 units NOVOLOG  283 (H)  8 units NOVOLOG  74 218 (H)  2 units NOVOLOG +  18 units LANTUS   Results for Emily Collins, Emily Collins (MRN [id number]) as of 05/01/2019 12:50  Ref. Range 05/01/2019 11:56 05/01/2019 12:05  Glucose-Capillary Latest Ref Range: 70 - 99 mg/dL 451 (H) 427 (H)  18 units NOVOLOG      Home DM Meds: Lantus 40-45 units BID       Humalog 5 units TID with meals  Current Orders: Lantus 18 units QHS      Novolog Moderate Correction Scale/ SSI (0-15 units) TID AC + HS      Novolog 3 units TID with meals      No CBG from 8am today.  Note that pt had mild Hypoglycemic event yesterday AM.  Lantus was reduced to 18 units QHS last PM.  CBG at 12pm today severely elevated to 427 mg/dl--18 units Novolog given for this CBG.    MD- Please consider the following in-hospital insulin adjustments:  1. Increase Lantus to 35 units QHS  2. Once patient resumes PO diet, Please consider Increasing Novolog Meal Coverage to: Novolog 5 units TID with meals (home dose)     --Will follow patient during hospitalization--  Wyn Quaker RN, MSN, CDE Diabetes Coordinator Inpatient Glycemic Control Team Team Pager: [phone] (8a-5p)

## 2019-05-02 DIAGNOSIS — A419 Sepsis, unspecified organism: Principal | ICD-10-CM

## 2019-05-02 LAB — GLUCOSE, CAPILLARY
Glucose-Capillary: 166 mg/dL — ABNORMAL HIGH (ref 70–99)
Glucose-Capillary: 177 mg/dL — ABNORMAL HIGH (ref 70–99)
Glucose-Capillary: 350 mg/dL — ABNORMAL HIGH (ref 70–99)
Glucose-Capillary: 248 mg/dL — ABNORMAL HIGH (ref 70–99)

## 2019-05-02 LAB — CBC
HCT: 25.1 % — ABNORMAL LOW (ref 36.0–46.0)
Hemoglobin: 7.8 g/dL — ABNORMAL LOW (ref 12.0–15.0)
MCH: 27.7 pg (ref 26.0–34.0)
MCHC: 31.1 g/dL (ref 30.0–36.0)
MCV: 89 fL (ref 80.0–100.0)
Platelets: 521 10*3/uL — ABNORMAL HIGH (ref 150–400)
RBC: 2.82 MIL/uL — ABNORMAL LOW (ref 3.87–5.11)
RDW: 15.2 % (ref 11.5–15.5)
WBC: 18.5 10*3/uL — ABNORMAL HIGH (ref 4.0–10.5)
nRBC: 0 % (ref 0.0–0.2)

## 2019-05-02 LAB — BASIC METABOLIC PANEL
Anion gap: 9 (ref 5–15)
BUN: 21 mg/dL — ABNORMAL HIGH (ref 6–20)
CO2: 23 mmol/L (ref 22–32)
Calcium: 7.9 mg/dL — ABNORMAL LOW (ref 8.9–10.3)
Chloride: 102 mmol/L (ref 98–111)
Creatinine, Ser: 1.39 mg/dL — ABNORMAL HIGH (ref 0.44–1.00)
GFR calc Af Amer: 50 mL/min — ABNORMAL LOW (ref >60)
GFR calc non Af Amer: 43 mL/min — ABNORMAL LOW (ref >60)
Glucose, Bld: 176 mg/dL — ABNORMAL HIGH (ref 70–99)
Potassium: 4.3 mmol/L (ref 3.5–5.1)
Sodium: 134 mmol/L — ABNORMAL LOW (ref 135–145)

## 2019-05-02 LAB — PROCALCITONIN: Procalcitonin: 0.81 ng/mL

## 2019-05-02 MED ORDER — SODIUM CHLORIDE 0.9 % IV SOLN
INTRAVENOUS | Status: DC
  Administered 2019-05-02: 08:00:00 via INTRAVENOUS

## 2019-05-02 MED ORDER — PANTOPRAZOLE SODIUM 40 MG PO TBEC
40.0000 mg | DELAYED_RELEASE_TABLET | Freq: Two times a day (BID) | ORAL | Status: DC
  Administered 2019-05-02 – 2019-05-06 (×8): 40 mg via ORAL
  Filled 2019-05-02 (×8): qty 1

## 2019-05-02 NOTE — Consult Note (Addendum)
Pharmacy Antibiotic Note  Emily Collins is a 53 y.o. female admitted on 04/26/2019 with Intra-abdominal pain and PNA.Marland Kitchen   She was originally started on Rocephin and azithromycin but her clinical situation worsened and coverage was broadened to Zosyn and vancomycin.The MRSA PCR was negative so vancomycin was discontinued. Additionally, CT of abdomen repeated showed left perinephric abscess. This is day #3 of Zosyn therapy; leukocytosis has improved but not yet resolved. Her renal function is at what appears to be her baseline.  Plan:  Continue pip/tazo 3.375 mg q8H extended interval  Height: 5\' 3"  (160 cm) Weight: 219 lb 12.8 oz (99.7 kg) IBW/kg (Calculated) : 52.4  Temp (24hrs), Avg:98.4 F (36.9 C), Min:98 F (36.7 C), Max:98.9 F (37.2 C)  Recent Labs  Lab 04/28/19 0510 04/29/19 0411 04/30/19 0519 05/01/19 0650 05/02/19 0817  WBC 13.0* 13.8* 17.9* 20.9* 18.5*  CREATININE 1.48* 1.36* 1.26* 1.29* 1.39*    Estimated Creatinine Clearance: 52.7 mL/min (A) (by C-G formula based on SCr of 1.39 mg/dL (H)).    Allergies  Allergen Reactions  . Atorvastatin     Muscle and join pains.  Mack Hook  [Levofloxacin In D5w]     GI upset  . Simvastatin     joint aches.  . Dilaudid  [Hydromorphone Hcl] Hives, Itching and Rash  . Tetracycline Rash    Antimicrobials this admission: 8/1 ceftriaxone >> 8/5 8/2 azithromycin >> 8/5 8/5 pip/tazo >>  8/5 vancomycin >> 8/6  Microbiology results: 8/5 MRSA PCR negative 8/1 UCx <10k CFU 8/6 RCx recollect  Thank you for allowing pharmacy to be a part of this patient's care.  Dallie Piles, PharmD 05/02/2019 9:32 AM

## 2019-05-02 NOTE — Progress Notes (Signed)
[place] at [place] NAME: Emily Collins    MR#:  [id number]  DATE OF BIRTH:  March 17, 1966  SUBJECTIVE:  Still has severe abdominal pain, CT of abdomen repeated showed left perinephric abscess, seen by urology Dr. Bernardo Heater, unable to get drainage by interventional radiology, urology recommends reimaging next 1 to 2 days for follow-up of left perinephric abscess.    REVIEW OF SYSTEMS:  CONSTITUTIONAL: No fever, fatigue or weakness.  EYES: No blurred or double vision.  EARS, NOSE, AND THROAT: No tinnitus or ear pain.  RESPIRATORY: No cough, shortness of breath, wheezing or hemoptysis.  CARDIOVASCULAR: No chest pain, orthopnea, edema.  GASTROINTESTINAL: Abdominal distention, severe abdominal pain mainly in the right and left upper quadrants. GENITOURINARY: No dysuria, hematuria.  ENDOCRINE: No polyuria, nocturia,  HEMATOLOGY: No anemia, easy bruising or bleeding SKIN: No rash or lesion. MUSCULOSKELETAL: No joint pain or arthritis.   NEUROLOGIC: No tingling, numbness, weakness.  PSYCHIATRY: No anxiety or depression.   DRUG ALLERGIES:   Allergies  Allergen Reactions  . Atorvastatin     Muscle and join pains.  Mack Hook  [Levofloxacin In D5w]     GI upset  . Simvastatin     joint aches.  . Dilaudid  [Hydromorphone Hcl] Hives, Itching and Rash  . Tetracycline Rash    VITALS:  Blood pressure 129/61, pulse 70, temperature 98.2 F (36.8 C), temperature source Oral, resp. rate 18, height 5\' 3"  (1.6 m), weight 99.7 kg, SpO2 93 %.  PHYSICAL EXAMINATION:  GENERAL:  53 y.o.-year-old patient lying in the bed with no acute distress.  EYES: Pupils equal, round, reactive to light and accommodation. No scleral icterus. Extraocular muscles intact.  HEENT: Head atraumatic, normocephalic. Oropharynx and nasopharynx clear.  NECK:  Supple, no jugular venous distention. No thyroid enlargement, no tenderness.  LUNGS: Normal breath sounds  bilaterally, no wheezing, rales,rhonchi or crepitation. No use of accessory muscles of respiration.  CARDIOVASCULAR: RRR, S1, S2 normal. No murmurs, rubs, or gallops.  ABDOMEN: Abdominal distention, diminished bowel sounds, right upper quadrant, left upper quadrant tenderness present.  Marland Kitchen  EXTREMITIES: No pedal edema, cyanosis, or clubbing.  NEUROLOGIC: Cranial nerves II through XII are intact. Muscle strength 5/5 in all extremities. Sensation intact. Gait not checked.  PSYCHIATRIC: The patient is alert and oriented x 3.  SKIN: No obvious rash, lesion, or ulcer.    LABORATORY PANEL:   CBC Recent Labs  Lab 05/02/19 0817  WBC 18.5*  HGB 7.8*  HCT 25.1*  PLT 521*   ------------------------------------------------------------------------------------------------------------------  Chemistries  Recent Labs  Lab 05/02/19 0817  NA 134*  K 4.3  CL 102  CO2 23  GLUCOSE 176*  BUN 21*  CREATININE 1.39*  CALCIUM 7.9*   ------------------------------------------------------------------------------------------------------------------  Cardiac Enzymes No results for input(s): TROPONINI in the last 168 hours. ------------------------------------------------------------------------------------------------------------------  RADIOLOGY:  Dg Abd 1 View  Result Date: 04/30/2019 CLINICAL DATA:  Umbilical and epigastric abdominal pain EXAM: ABDOMEN - 1 VIEW COMPARISON:  04/29/2019 FINDINGS: Nonobstructed bowel gas pattern with large amount of stool in the colon. Similar stool burden as compared to prior. No radiopaque calculi. IMPRESSION: Nonobstructed gas pattern with large amount of stool in the colon Electronically Signed   By: Donavan Foil M.D.   On: 04/30/2019 15:53   Nm Hepatobiliary Liver Func  Addendum Date: 05/01/2019   ADDENDUM REPORT: 05/01/2019 13:34 ADDENDUM: Note that the gallbladder visualizes significantly later than the small bowel. This finding does indicate patency of the  cystic duct. However, it may well be indicative of a degree of chronic cholecystitis. Electronically Signed   By: Bretta BangWilliam  Woodruff III M.D.   On: 05/01/2019 13:34   Result Date: 05/01/2019 CLINICAL DATA:  Upper abdominal pain EXAM: NUCLEAR MEDICINE HEPATOBILIARY IMAGING TECHNIQUE: Sequential images of the abdomen were obtained out to 60 minutes following intravenous administration of radiopharmaceutical. RADIOPHARMACEUTICALS:  5.248 mCi Tc-8063m mebrofenin IV COMPARISON:  Ultrasound right upper quadrant April 29, 2019 FINDINGS: Prompt uptake and biliary excretion of activity by the liver is seen. Gallbladder activity is visualized, consistent with patency of cystic duct. Biliary activity passes into small bowel, consistent with patent common bile duct. IMPRESSION: Study within normal limits. Electronically Signed: By: Bretta BangWilliam  Woodruff III M.D. On: 05/01/2019 09:18   Ct Abdomen Pelvis W Contrast  Result Date: 05/01/2019 CLINICAL DATA:  Abdominal pain, gastroenteritis or polite is suspected, abdominal distension, pyelonephritis, elevated WBC EXAM: CT ABDOMEN AND PELVIS WITH CONTRAST TECHNIQUE: Multidetector CT imaging of the abdomen and pelvis was performed using the standard protocol following bolus administration of intravenous contrast. CONTRAST:  100mL OMNIPAQUE IOHEXOL 300 MG/ML SOLN, additional oral enteric contrast COMPARISON:  04/26/2019 FINDINGS: Lower chest: There are new small bilateral pleural effusions and associated atelectasis or consolidation. Scattered ground-glass opacities in the bilateral lung bases. Hepatobiliary: No solid liver abnormality is seen. No gallstones. Mild gallbladder wall thickening without biliary ductal dilatation (series 2, image 29). Pancreas: Unremarkable. No pancreatic ductal dilatation or surrounding inflammatory changes. Spleen: Normal in size without significant abnormality. Adrenals/Urinary Tract: Adrenal glands are unremarkable. Extensive fat stranding about the left  kidney, with a subcapsular collection about the lateral aspect of the superior pole measuring 4.4 x 1.5 x 3.7 cm (series 2, image 48, series 5, image 17). Bladder is unremarkable. Stomach/Bowel: Stomach is within normal limits. Large burden of stool in the colon. No evidence of bowel wall thickening, distention, or inflammatory changes. Vascular/Lymphatic: Aortic atherosclerosis. No enlarged abdominal or pelvic lymph nodes. Reproductive: No mass or other significant abnormality. Other: No abdominal wall hernia or abnormality. No abdominopelvic ascites. Musculoskeletal: No acute or significant osseous findings. IMPRESSION: 1. Extensive fat stranding about the left kidney, with a subcapsular collection about the lateral aspect of the superior pole measuring 4.4 x 1.5 x 3.7 cm (series 2, image 48, series 5, image 17). Findings are consistent with perinephric abscess in the setting of pyelonephritis. 2. There are new small bilateral pleural effusions and associated atelectasis or consolidation. Scattered ground-glass opacities in the bilateral lung bases. Findings are nonspecific and infectious or inflammatory. 3. Mild gallbladder wall thickening without gallstones or biliary ductal dilatation (series 2, image 29). 4.  Large burden of stool in the colon. Electronically Signed   By: Lauralyn PrimesAlex  Bibbey M.D.   On: 05/01/2019 16:06    EKG:  No orders found for this or any previous visit.  ASSESSMENT AND PLAN:   Sepsis-with a left perinephric abscess, seen by urology, recommended for radiology for abscess drainage, interventional radiology felt abscess is too small for drainage, urology recommends repeating the image in the next 1 to 2 days for follow-up of perinephric abscess, for now patient is on IV antibiotics with Zosyn, Leukocytosis slightly better today.  Diabetes mellitus type 2, diabetes coordinator on the case, patient is on Lantus, mealtime insulin.  Blood sugar better controlled today.  Continue present dose  of Lantus 35 units at night, NovoLog 5 units with meals.  Acute renal failure in CKD II- creatinine improving  - -Avoid nephrotoxic agents  Normocytic  anemia- patient denies any active bleeding.  Hemoglobin stable around 8. -Anemia panel unremarkable Fecal occult blood test is negative.      All the records are reviewed and case discussed with Care Management/Social Workerr. Management plans discussed with the patient, family and they are in agreement.  CODE STATUS: Full   TOTAL TIME TAKING CARE OF THIS PATIENT: 40  minutes.  More than 50% time spent in counseling, coordination of care POSSIBLE D/C IN 1-2 DAYS, DEPENDING ON CLINICAL CONDITION.  Note: This dictation was prepared with Dragon dictation along with smaller phrase technology. Any transcriptional errors that result from this process are unintentional.   Katha HammingSnehalatha Britten Parady M.D on 05/02/2019 at 11:48 AM  Between 7am to 6pm - Pager - [phone]  After 6pm go to [email or web address] - password [email or web address] [facility]  [facility]  Office  [phone]  CC: Primary care physician; Oswaldo ConroyBender, Abby Daneele, MD

## 2019-05-02 NOTE — Consult Note (Signed)
Urology Consult  I have been asked to see the patient by Dr. Luberta Mutter, for evaluation and management of left perinephric abscess.  Chief Complaint: Left flank, RUQ, and epigastric pain  History of Present Illness: Emily Collins is a 53 y.o. year old female with PMH DMII who presented to the emergency department on 04/26/2019 with complaints of left flank pain and vomiting x3 days.  She had sought care for the same at her PCP approximately 3 days prior and prescribed an unknown antibiotic.  She was counseled to proceed to the emergency department if her symptoms did not improve after 72 hours.  CT stone study consistent with left pyelonephritis, also with bilateral lower lobe pneumonia.  She was found to be hyponatremic to 127 and admitted for these problems.  Patient was initiated on ceftriaxone and azithromycin for her infections, however her pain continued.  She developed increasing right upper quadrant and epigastric pain.  RUQ ultrasound and HIDA scan reassuring for biliary etiology.  Lipase normal.  CT abdomen pelvis with contrast on 05/01/2019 with interval development of a 4.4 x 1.5 x 3.7 cm perinephric abscess.  We are being consulted for management of this.  Today, patient reports left flank burning and LUQ/epigastric pain.  She reports she is feeling otherwise better and able to tolerate PO intake without vomiting.  She is afebrile and normotensive, pulse is 70 as of this morning.  Urine culture from 04/26/2019 with less than 10,000 colonies of insignificant growth.  No blood cultures obtained at the time of admission.  Creatinine 1.39, down from admission value of 1.69.  White count variable between 13.0 and 20.9 during admission.  Of note, she reports leukocytosis at baseline that was worked up by her PCP for "years ago" with no cause found.  Past Medical History:  Diagnosis Date   Anxiety    Arthritis    Depression    Diabetes mellitus without complication (HCC)    GERD  (gastroesophageal reflux disease)    History of chicken pox 04/07/2015   DID have Chicken Pox.     Hyperlipidemia    Venous (peripheral) insufficiency     Past Surgical History:  Procedure Laterality Date   APPENDECTOMY  1979   BILATERAL CARPAL TUNNEL RELEASE Bilateral    Dr. Rosita Kea   CESAREAN SECTION  2003   LAPAROSCOPIC LYSIS OF ADHESIONS     SHOULDER SURGERY Right 08/2009   Dr. Rosita Kea   TUBAL LIGATION      Home Medications:  Current Meds  Medication Sig   aspirin 81 MG tablet Take 81 mg by mouth daily.   cetirizine (ZYRTEC) 10 MG tablet TAKE 1 TABLET(10 MG) BY MOUTH DAILY (Patient taking differently: Take 10 mg by mouth daily. )   FLUoxetine (PROZAC) 40 MG capsule TAKE 1 CAPSULE BY MOUTH EVERY DAY (Patient taking differently: Take 40 mg by mouth daily. TAKE 1 CAPSULE BY MOUTH EVERY DAY)   gabapentin (NEURONTIN) 600 MG tablet Take 600 mg by mouth at bedtime.   HUMALOG KWIKPEN 100 UNIT/ML KiwkPen 5 Units 3 (three) times daily with meals. INJ UP TO 100 UNI [place] PER DAY AS PER MD INSTRUCTIONS   hydrOXYzine (ATARAX/VISTARIL) 10 MG tablet Take 10 mg by mouth 3 (three) times daily as needed.   LANTUS SOLOSTAR 100 UNIT/ML Solostar Pen INJECT 40 UNITS UNDER THE SKIN TWICE DAILY (Patient taking differently: Inject 40-45 Units into the skin 2 (two) times daily. )   meloxicam (MOBIC) 15 MG tablet TAKE 1  TABLET(15 MG) BY MOUTH DAILY WITH FOOD AS NEEDED FOR ARTHRITIS PAIN   montelukast (SINGULAIR) 10 MG tablet TAKE 1 TABLET BY MOUTH AT BEDTIME (Patient taking differently: Take 10 mg by mouth at bedtime. )   rosuvastatin (CRESTOR) 5 MG tablet TAKE 1 TABLET(5 MG) BY MOUTH DAILY    Allergies:  Allergies  Allergen Reactions   Atorvastatin     Muscle and join pains.   Levaquin  [Levofloxacin In D5w]     GI upset   Simvastatin     joint aches.   Dilaudid  [Hydromorphone Hcl] Hives, Itching and Rash   Tetracycline Rash    Family History  Problem Relation Age of Onset     Hypertension Mother    Diabetes Mother    Hyperlipidemia Mother    Arthritis Mother    Heart disease Father    Kidney disease Father     Social History:  reports that she quit smoking about 19 months ago. Her smoking use included cigarettes. She started smoking about 34 years ago. She has a 30.00 pack-year smoking history. She has never used smokeless tobacco. She reports current alcohol use of about 1.0 - 2.0 standard drinks of alcohol per week. She reports that she does not use drugs.  ROS: A complete review of systems was performed.  All systems are negative except for pertinent findings as noted.  Physical Exam:  Vital signs in last 24 hours: Temp:  [98 F (36.7 C)-98.9 F (37.2 C)] 98.2 F (36.8 C) (08/07 0506) Pulse Rate:  [70-72] 70 (08/07 0506) Resp:  [17-18] 18 (08/07 0506) BP: (117-129)/(52-61) 129/61 (08/07 0506) SpO2:  [93 %-99 %] 93 % (08/07 0506) Constitutional:  Alert and oriented, no acute distress HEENT: [place] AT, moist mucus membranes. Respiratory: Normal respiratory effort GI: Upper abdomen is distended but nontender to palpation.  Lower abdomen is soft and nontender. No abdominal masses palpated.  GU: No CVA tenderness Skin: No rashes, bruises or suspicious lesions Neurologic: Grossly intact, no focal deficits, moving all 4 extremities Psychiatric: Normal mood and affect  Laboratory Data:  Recent Labs    04/30/19 0519 05/01/19 0650 05/02/19 0817  WBC 17.9* 20.9* 18.5*  HGB 8.9* 8.2* 7.8*  HCT 27.6* 25.3* 25.1*   Recent Labs    04/30/19 0519 05/01/19 0650 05/02/19 0817  NA 136  --  134*  K 4.1  --  4.3  CL 105  --  102  CO2 21*  --  23  GLUCOSE 71  --  176*  BUN 18  --  21*  CREATININE 1.26* 1.29* 1.39*  CALCIUM 7.9*  --  7.9*   Urinalysis    Component Value Date/Time   COLORURINE YELLOW (A) 04/26/2019 1650   APPEARANCEUR CLEAR (A) 04/26/2019 1650   LABSPEC 1.013 04/26/2019 1650   PHURINE 7.0 04/26/2019 1650   GLUCOSEU >=500 (A)  04/26/2019 1650   HGBUR MODERATE (A) 04/26/2019 1650   BILIRUBINUR NEGATIVE 04/26/2019 1650   KETONESUR 5 (A) 04/26/2019 1650   PROTEINUR NEGATIVE 04/26/2019 1650   NITRITE NEGATIVE 04/26/2019 1650   LEUKOCYTESUR SMALL (A) 04/26/2019 1650   Results for orders placed or performed during the hospital encounter of 04/26/19  SARS Coronavirus 2 [facility] order, Performed in [place] hospital lab) Nasopharyngeal     Status: None   Collection Time: 04/26/19  5:07 PM   Specimen: Nasopharyngeal  Result Value Ref Range Status   SARS Coronavirus 2 NEGATIVE NEGATIVE Final    Comment: (NOTE) If result is NEGATIVE  SARS-CoV-2 target nucleic acids are NOT DETECTED. The SARS-CoV-2 RNA is generally detectable in upper and lower  respiratory specimens during the acute phase of infection. The lowest  concentration of SARS-CoV-2 viral copies this assay can detect is 250  copies / mL. A negative result does not preclude SARS-CoV-2 infection  and should not be used as the sole basis for treatment or other  patient management decisions.  A negative result may occur with  improper specimen collection / handling, submission of specimen other  than nasopharyngeal swab, presence of viral mutation(s) within the  areas targeted by this assay, and inadequate number of viral copies  (<250 copies / mL). A negative result must be combined with clinical  observations, patient history, and epidemiological information. If result is POSITIVE SARS-CoV-2 target nucleic acids are DETECTED. The SARS-CoV-2 RNA is generally detectable in upper and lower  respiratory specimens dur ing the acute phase of infection.  Positive  results are indicative of active infection with SARS-CoV-2.  Clinical  correlation with patient history and other diagnostic information is  necessary to determine patient infection status.  Positive results do  not rule out bacterial infection or co-infection with other viruses. If result is  PRESUMPTIVE POSTIVE SARS-CoV-2 nucleic acids MAY BE PRESENT.   A presumptive positive result was obtained on the submitted specimen  and confirmed on repeat testing.  While 2019 novel coronavirus  (SARS-CoV-2) nucleic acids may be present in the submitted sample  additional confirmatory testing may be necessary for epidemiological  and / or clinical management purposes  to differentiate between  SARS-CoV-2 and other Sarbecovirus currently known to infect humans.  If clinically indicated additional testing with an alternate test  methodology [phone]) is advised. The SARS-CoV-2 RNA is generally  detectable in upper and lower respiratory sp ecimens during the acute  phase of infection. The expected result is Negative. Fact Sheet for Patients:  BoilerBrush.com.cyhttps://www.fda.gov/media/136312/download Fact Sheet for Healthcare Providers: https://pope.com/https://www.fda.gov/media/136313/download This test is not yet approved or cleared by the [place] FDA and has been authorized for detection and/or diagnosis of SARS-CoV-2 by FDA under an Emergency Use Authorization (EUA).  This EUA will remain in effect (meaning this test can be used) for the duration of the COVID-19 declaration under Section 564(b)(1) of the Act, 21 U.S.C. section 360bbb-3(b)(1), unless the authorization is terminated or revoked sooner. Performed at [facility], [address] [place], [place] [address]   Urine culture     Status: Abnormal   Collection Time: 04/26/19  5:10 PM   Specimen: Urine, Random  Result Value Ref Range Status   Specimen Description   Final    URINE, RANDOM Performed at [facility], [address] [place], [place] [address]    Special Requests   Final    NONE Performed at [facility], [address] [place], [place] [address]    Culture (A)  Final    <10,000 COLONIES/mL INSIGNIFICANT GROWTH Performed at [facility], [address] [place], [place] [address]    Report  Status 04/28/2019 FINAL  Final  MRSA PCR Screening     Status: None   Collection Time: 05/01/19  5:03 AM   Specimen: Nasal Mucosa; Nasopharyngeal  Result Value Ref Range Status   MRSA by PCR NEGATIVE NEGATIVE Final    Comment:        The GeneXpert MRSA Assay (FDA approved for NASAL specimens only), is one component of a comprehensive MRSA colonization surveillance program. It is not  intended to diagnose MRSA infection nor to guide or monitor treatment for MRSA infections. Performed at [facility], [address] [place], [place] [address]   Expectorated sputum assessment w rflx to resp cult     Status: None   Collection Time: 05/01/19  7:23 AM   Specimen: Expectorated Sputum  Result Value Ref Range Status   Specimen Description EXPECTORATED SPUTUM  Final   Special Requests Normal  Final   Sputum evaluation   Final    Sputum specimen not acceptable for testing.  Please recollect.   NOTIFIED BETH RICHARDSON AT 0900 ON 05/01/2019 JJB Performed at [facility], [address] [place], [place] [address]    Report Status 05/01/2019 FINAL  Final    Radiologic Imaging: Ct Abdomen Pelvis W Contrast  Result Date: 05/01/2019 CLINICAL DATA:  Abdominal pain, gastroenteritis or polite is suspected, abdominal distension, pyelonephritis, elevated WBC EXAM: CT ABDOMEN AND PELVIS WITH CONTRAST TECHNIQUE: Multidetector CT imaging of the abdomen and pelvis was performed using the standard protocol following bolus administration of intravenous contrast. CONTRAST:  100mL OMNIPAQUE IOHEXOL 300 MG/ML SOLN, additional oral enteric contrast COMPARISON:  04/26/2019 FINDINGS: Lower chest: There are new small bilateral pleural effusions and associated atelectasis or consolidation. Scattered ground-glass opacities in the bilateral lung bases. Hepatobiliary: No solid liver abnormality is seen. No gallstones. Mild gallbladder wall thickening without biliary ductal dilatation (series 2, image  29). Pancreas: Unremarkable. No pancreatic ductal dilatation or surrounding inflammatory changes. Spleen: Normal in size without significant abnormality. Adrenals/Urinary Tract: Adrenal glands are unremarkable. Extensive fat stranding about the left kidney, with a subcapsular collection about the lateral aspect of the superior pole measuring 4.4 x 1.5 x 3.7 cm (series 2, image 48, series 5, image 17). Bladder is unremarkable. Stomach/Bowel: Stomach is within normal limits. Large burden of stool in the colon. No evidence of bowel wall thickening, distention, or inflammatory changes. Vascular/Lymphatic: Aortic atherosclerosis. No enlarged abdominal or pelvic lymph nodes. Reproductive: No mass or other significant abnormality. Other: No abdominal wall hernia or abnormality. No abdominopelvic ascites. Musculoskeletal: No acute or significant osseous findings. IMPRESSION: 1. Extensive fat stranding about the left kidney, with a subcapsular collection about the lateral aspect of the superior pole measuring 4.4 x 1.5 x 3.7 cm (series 2, image 48, series 5, image 17). Findings are consistent with perinephric abscess in the setting of pyelonephritis. 2. There are new small bilateral pleural effusions and associated atelectasis or consolidation. Scattered ground-glass opacities in the bilateral lung bases. Findings are nonspecific and infectious or inflammatory. 3. Mild gallbladder wall thickening without gallstones or biliary ductal dilatation (series 2, image 29). 4.  Large burden of stool in the colon. Electronically Signed   By: Lauralyn PrimesAlex  Bibbey M.D.   On: 05/01/2019 16:06   I personally reviewed the above imaging and agree with radiology interpretation.  Assessment & Plan:  Patient admitted with acute left pyelonephritis with development of perinephric abscess during hospitalization and despite antibiotics.  No culture data available to guide antibiotic treatment.  Despite consistent reported pain, patient is improving  clinically with apparently resolved sepsis.    Treatment for perinephric abscess typically involves antibiotics and percutaneous drainage.  However, upon discussion with interventional radiology, they feel that the imaged abscess is too small for drainage.  Given that patient has improved clinically on broad-spectrum antibiotics and is now clinically stable, I recommend continuation of these and will defer drainage at this time.  I recommend close monitoring for acute worsening of symptoms, at which  time we will reimage for evaluation of abscess evolution and may revisit an IR intervention.  Of note, patient's chief complaint at this point is related to epigastric discomfort and abdominal distention.  These will represent an atypical presentation of a perinephric abscess.  I am not convinced that the abscess is the source of these complaints.  Of note, imaging has shown persistence of large stool burden.  I suspect constipation may be playing a role in the symptoms. -Continue broad-spectrum antibiotics -Continue to monitor for acute worsening  Thank you for involving me in this patient's care, I will continue to follow along.  Carman ChingSamantha Taleya Whitcher, PA-C 05/02/2019 9:45 AM

## 2019-05-02 NOTE — Progress Notes (Signed)
Midline site assessment: site appears patent, flushes easily though no blood return noted. Pt denies pain in the arm, reports her feet are swelling as well. Unclear why L arm is swelling. Barnabas Lister, RN made aware.

## 2019-05-03 ENCOUNTER — Inpatient Hospital Stay: Payer: Medicaid Other

## 2019-05-03 LAB — COMPREHENSIVE METABOLIC PANEL
ALT: 157 U/L — ABNORMAL HIGH (ref 0–44)
AST: 47 U/L — ABNORMAL HIGH (ref 15–41)
Albumin: 2.8 g/dL — ABNORMAL LOW (ref 3.5–5.0)
Alkaline Phosphatase: 601 U/L — ABNORMAL HIGH (ref 38–126)
Anion gap: 10 (ref 5–15)
BUN: 18 mg/dL (ref 6–20)
CO2: 22 mmol/L (ref 22–32)
Calcium: 7.4 mg/dL — ABNORMAL LOW (ref 8.9–10.3)
Chloride: 102 mmol/L (ref 98–111)
Creatinine, Ser: 1.51 mg/dL — ABNORMAL HIGH (ref 0.44–1.00)
GFR calc Af Amer: 45 mL/min — ABNORMAL LOW (ref >60)
GFR calc non Af Amer: 39 mL/min — ABNORMAL LOW (ref >60)
Glucose, Bld: 269 mg/dL — ABNORMAL HIGH (ref 70–99)
Potassium: 4.2 mmol/L (ref 3.5–5.1)
Sodium: 134 mmol/L — ABNORMAL LOW (ref 135–145)
Total Bilirubin: 0.5 mg/dL (ref 0.3–1.2)
Total Protein: 7.3 g/dL (ref 6.5–8.1)

## 2019-05-03 LAB — CBC
HCT: 26.3 % — ABNORMAL LOW (ref 36.0–46.0)
Hemoglobin: 8.2 g/dL — ABNORMAL LOW (ref 12.0–15.0)
MCH: 27.8 pg (ref 26.0–34.0)
MCHC: 31.2 g/dL (ref 30.0–36.0)
MCV: 89.2 fL (ref 80.0–100.0)
Platelets: 623 10*3/uL — ABNORMAL HIGH (ref 150–400)
RBC: 2.95 MIL/uL — ABNORMAL LOW (ref 3.87–5.11)
RDW: 15 % (ref 11.5–15.5)
WBC: 18.6 10*3/uL — ABNORMAL HIGH (ref 4.0–10.5)
nRBC: 0 % (ref 0.0–0.2)

## 2019-05-03 LAB — GLUCOSE, CAPILLARY
Glucose-Capillary: 138 mg/dL — ABNORMAL HIGH (ref 70–99)
Glucose-Capillary: 255 mg/dL — ABNORMAL HIGH (ref 70–99)
Glucose-Capillary: 359 mg/dL — ABNORMAL HIGH (ref 70–99)
Glucose-Capillary: 294 mg/dL — ABNORMAL HIGH (ref 70–99)

## 2019-05-03 LAB — PROCALCITONIN: Procalcitonin: 0.54 ng/mL

## 2019-05-03 LAB — LIPASE, BLOOD: Lipase: 19 U/L (ref 11–51)

## 2019-05-03 NOTE — Progress Notes (Signed)
[facility] [place] [facility]     PATIENT NAME: Emily Collins    MR#:  [id number]  DATE OF BIRTH:  1966/02/17  SUBJECTIVE:   Patient planing of some epigastric abdominal pain with some intermittent nausea.  White cell count is still slightly elevated.  KUB this morning showing moderate stool burden.  REVIEW OF SYSTEMS:    Review of Systems  Constitutional: Negative for chills and fever.  HENT: Negative for congestion and tinnitus.   Eyes: Negative for blurred vision and double vision.  Respiratory: Negative for cough, shortness of breath and wheezing.   Cardiovascular: Negative for chest pain, orthopnea and PND.  Gastrointestinal: Positive for abdominal pain and nausea. Negative for diarrhea and vomiting.  Genitourinary: Negative for dysuria and hematuria.  Neurological: Negative for dizziness, sensory change and focal weakness.  All other systems reviewed and are negative.   Nutrition: Carb control Tolerating Diet: Yes Tolerating PT: Yes  DRUG ALLERGIES:   Allergies  Allergen Reactions  . Atorvastatin     Muscle and join pains.  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 Levaquin  [Levofloxacin In D5w]     GI upset  . Simvastatin     joint aches.  . Dilaudid  [Hydromorphone Hcl] Hives, Itching and Rash  . Tetracycline Rash    VITALS:  Blood pressure (!) 122/46, pulse 73, temperature 98 F (36.7 C), temperature source Oral, resp. rate 18, height 5\' 3"  (1.6 m), weight 99.7 kg, SpO2 97 %.  PHYSICAL EXAMINATION:   Physical Exam  GENERAL:  53 y.o.-year-old obese patient lying in bed in no acute distress.  EYES: Pupils equal, round, reactive to light and accommodation. No scleral icterus. Extraocular muscles intact.  HEENT: Head atraumatic, normocephalic. Oropharynx and nasopharynx clear.  NECK:  Supple, no jugular venous distention. No thyroid enlargement, no tenderness.  LUNGS: Normal breath sounds bilaterally, no wheezing, rales, rhonchi. No use of accessory muscles of  respiration.  CARDIOVASCULAR: S1, S2 normal. No murmurs, rubs, or gallops.  ABDOMEN: Soft, Tender in epigastric area, no rebound, rigidity, nondistended. Bowel sounds present. No organomegaly or mass.  EXTREMITIES: No cyanosis, clubbing or edema b/l.    NEUROLOGIC: Cranial nerves II through XII are intact. No focal Motor or sensory deficits b/l.   PSYCHIATRIC: The patient is alert and oriented x 3.  SKIN: No obvious rash, lesion, or ulcer.    LABORATORY PANEL:   CBC Recent Labs  Lab 05/03/19 1226  WBC 18.6*  HGB 8.2*  HCT 26.3*  PLT 623*   ------------------------------------------------------------------------------------------------------------------  Chemistries  Recent Labs  Lab 05/03/19 1226  NA 134*  K 4.2  CL 102  CO2 22  GLUCOSE 269*  BUN 18  CREATININE 1.51*  CALCIUM 7.4*  AST 47*  ALT 157*  ALKPHOS 601*  BILITOT 0.5   ------------------------------------------------------------------------------------------------------------------  Cardiac Enzymes No results for input(s): TROPONINI in the last 168 hours. ------------------------------------------------------------------------------------------------------------------  RADIOLOGY:  Dg Abd 1 View  Result Date: 05/03/2019 CLINICAL DATA:  Abdominal distension. EXAM: ABDOMEN - 1 VIEW COMPARISON:  CT of the abdomen and pelvis on 05/01/2019 and abdominal film on 04/30/2019 FINDINGS: Large amount of fecal material is again noted distributed throughout the colon without focal impaction or associated small bowel obstruction. No obvious free air. No abnormal calcifications. IMPRESSION: Large amount of fecal material distributed throughout the colon. Electronically Signed   By: Irish LackGlenn  Yamagata M.D.   On: 05/03/2019 12:47   Ct Abdomen Pelvis W Contrast  Result Date: 05/01/2019 CLINICAL DATA:  Abdominal pain, gastroenteritis or polite is  suspected, abdominal distension, pyelonephritis, elevated WBC EXAM: CT ABDOMEN AND  PELVIS WITH CONTRAST TECHNIQUE: Multidetector CT imaging of the abdomen and pelvis was performed using the standard protocol following bolus administration of intravenous contrast. CONTRAST:  100mL OMNIPAQUE IOHEXOL 300 MG/ML SOLN, additional oral enteric contrast COMPARISON:  04/26/2019 FINDINGS: Lower chest: There are new small bilateral pleural effusions and associated atelectasis or consolidation. Scattered ground-glass opacities in the bilateral lung bases. Hepatobiliary: No solid liver abnormality is seen. No gallstones. Mild gallbladder wall thickening without biliary ductal dilatation (series 2, image 29). Pancreas: Unremarkable. No pancreatic ductal dilatation or surrounding inflammatory changes. Spleen: Normal in size without significant abnormality. Adrenals/Urinary Tract: Adrenal glands are unremarkable. Extensive fat stranding about the left kidney, with a subcapsular collection about the lateral aspect of the superior pole measuring 4.4 x 1.5 x 3.7 cm (series 2, image 48, series 5, image 17). Bladder is unremarkable. Stomach/Bowel: Stomach is within normal limits. Large burden of stool in the colon. No evidence of bowel wall thickening, distention, or inflammatory changes. Vascular/Lymphatic: Aortic atherosclerosis. No enlarged abdominal or pelvic lymph nodes. Reproductive: No mass or other significant abnormality. Other: No abdominal wall hernia or abnormality. No abdominopelvic ascites. Musculoskeletal: No acute or significant osseous findings. IMPRESSION: 1. Extensive fat stranding about the left kidney, with a subcapsular collection about the lateral aspect of the superior pole measuring 4.4 x 1.5 x 3.7 cm (series 2, image 48, series 5, image 17). Findings are consistent with perinephric abscess in the setting of pyelonephritis. 2. There are new small bilateral pleural effusions and associated atelectasis or consolidation. Scattered ground-glass opacities in the bilateral lung bases. Findings are  nonspecific and infectious or inflammatory. 3. Mild gallbladder wall thickening without gallstones or biliary ductal dilatation (series 2, image 29). 4.  Large burden of stool in the colon. Electronically Signed   By: Lauralyn PrimesAlex  Bibbey M.D.   On: 05/01/2019 16:06     ASSESSMENT AND PLAN:   53 year old female with past medical history of diabetes, GERD, hyperlipidemia, anxiety, osteoarthritis who presented to the hospital significant lower back pain left greater than right and also nausea vomiting and suspected to have sepsis secondary to pyelonephritis/pneumonia.  1.  Sepsis- patient ruled in on admission due to leukocytosis, CT scan findings suggestive of left-sided pyelonephritis/perinephric abscess. - Continue IV Zosyn, follow cultures which are currently negative.  Follow white cell count which is still elevated.  2.  Pyelonephritis/perinephric abscess-this was noted on the CT scan of the abdomen pelvis.  Continue IV antibiotics with IV Zosyn.  Seen by urology and they discussed with interventional radiology and no plans for urgent drainage of the perinephric abscess.  They recommend continuing supportive care with IV fluids, pain control and doing repeat imaging next week if needed.  3.  Epigastric abdominal pain-patient complaining of some epigastric pain, LFTs are stable, lipase was normal.  Abdominal CT was negative for pancreatitis/biliary pathology.  KUB this morning still showing large burden of stool in the colon. -Possibly rate constipation.  Will give fleets enema and follow response.  4.  Neuropathy-continue gabapentin.  5.  Depression-continue Prozac.  6.  Diabetes type 2 without complication- continue Lantus, sliding scale insulin, follow blood sugars.  7. Hyperlipidemia - cont. Crestor.    All the records are reviewed and case discussed with Care Management/Social Worker. Management plans discussed with the patient, family and they are in agreement.  CODE STATUS: Full code   DVT Prophylaxis: Lovenox  TOTAL TIME TAKING CARE OF THIS PATIENT: 30 minutes.  POSSIBLE D/C IN 2-3 DAYS, DEPENDING ON CLINICAL CONDITION.   Henreitta Leber M.D on 05/03/2019 at 3:15 PM  Between 7am to 6pm - Pager - [phone]  After 6pm go to [email or web address] - Proofreader  [facility]  Office  [phone]  CC: Primary care physician; Letta Median, MD

## 2019-05-03 NOTE — Plan of Care (Signed)
Patient doing ok today.  She has been having quite a bit of upper abdominal pain and distention.  Pain is relieved some with Tramadol and Morphine.  Xray just showed some stool in there.  She did agree to take a enema this afternoon with some results.  Back pain has improved.  No significant changes.  Continue to monitor.

## 2019-05-03 NOTE — Progress Notes (Signed)
Urology Consult Follow Up  Subjective: Complaining of epigastric pain and bloating R >L which is worse after eating.  Minimal flank pain.  VSS, afebrile  Anti-infectives: Anti-infectives (From admission, onward)   Start     Dose/Rate Route Frequency Ordered Stop   05/01/19 1800  vancomycin (VANCOCIN) IVPB 1000 mg/200 mL premix  Status:  Discontinued     1,000 mg 200 mL/hr over 60 Minutes Intravenous Every 24 hours 04/30/19 1533 05/01/19 0940   04/30/19 1600  vancomycin (VANCOCIN) 2,000 mg in sodium chloride 0.9 % 500 mL IVPB     2,000 mg 250 mL/hr over 120 Minutes Intravenous  Once 04/30/19 1533 05/01/19 0700   04/30/19 1545  piperacillin-tazobactam (ZOSYN) IVPB 3.375 g     3.375 g 12.5 mL/hr over 240 Minutes Intravenous Every 8 hours 04/30/19 1533     04/27/19 1000  azithromycin (ZITHROMAX) tablet 250 mg  Status:  Discontinued     250 mg Oral Daily 04/26/19 2040 04/30/19 1517   04/26/19 2100  cefTRIAXone (ROCEPHIN) 1 g in sodium chloride 0.9 % 100 mL IVPB  Status:  Discontinued     1 g 200 mL/hr over 30 Minutes Intravenous Every 24 hours 04/26/19 2040 04/30/19 1517      Current Facility-Administered Medications  Medication Dose Route Frequency Provider Last Rate Last Dose  . 0.9 %  sodium chloride infusion   Intravenous PRN Katha HammingKonidena, Snehalatha, MD 10 mL/hr at 05/02/19 1506 1,000 mL at 05/02/19 1506  . acetaminophen (TYLENOL) tablet 650 mg  650 mg Oral Q6H PRN Enedina FinnerPatel, Sona, MD       Or  . acetaminophen (TYLENOL) suppository 650 mg  650 mg Rectal Q6H PRN Enedina FinnerPatel, Sona, MD      . albuterol (PROVENTIL) (2.5 MG/3ML) 0.083% nebulizer solution 2.5 mg  2.5 mg Nebulization Q2H PRN Enedina FinnerPatel, Sona, MD      . alum & mag hydroxide-simeth (MAALOX/MYLANTA) 200-200-20 MG/5ML suspension 30 mL  30 mL Oral Q6H PRN Mayo, Allyn KennerKaty Dodd, MD   30 mL at 04/30/19 1245   And  . lidocaine (XYLOCAINE) 2 % viscous mouth solution 15 mL  15 mL Oral Q6H PRN Mayo, Allyn KennerKaty Dodd, MD      . aspirin EC tablet 81 mg  81 mg Oral  Daily Enedina FinnerPatel, Sona, MD   81 mg at 05/03/19 0913  . diphenhydrAMINE (BENADRYL) capsule 25 mg  25 mg Oral Q6H PRN Enedina FinnerPatel, Sona, MD      . FLUoxetine (PROZAC) capsule 40 mg  40 mg Oral Daily Enedina FinnerPatel, Sona, MD   40 mg at 05/03/19 0913  . fluticasone (FLONASE) 50 MCG/ACT nasal spray 1 spray  1 spray Each Nare Daily Enedina FinnerPatel, Sona, MD   1 spray at 05/03/19 [phone]  . gabapentin (NEURONTIN) tablet 600 mg  600 mg Oral QHS Enedina FinnerPatel, Sona, MD   600 mg at 05/02/19 2207  . heparin injection 5,000 Units  5,000 Units Subcutaneous Hazle QuantQ8H Patel, Sona, MD   5,000 Units at 05/03/19 [phone]  . hydrOXYzine (ATARAX/VISTARIL) tablet 10 mg  10 mg Oral TID PRN Enedina FinnerPatel, Sona, MD      . insulin aspart (novoLOG) injection 0-15 Units  0-15 Units Subcutaneous TID [facility] Ened[facility] FinnerPatel, Sona, MD   2 Units at 05/03/19 0914  . insulin aspart (novoLOG) injection 0-5 Units  0-5 Units Subcutaneous QHS Enedina FinnerPatel, Sona, MD   2 Units at 05/02/19 2208  . insulin glargine (LANTUS) injection 35 Units  35 Units Subcutaneous Q2200 Katha HammingKonidena, Snehalatha, MD   35 Units at 05/02/19 2208  .  loratadine (CLARITIN) tablet 10 mg  10 mg Oral Daily Fritzi Mandes, MD   10 mg at 05/03/19 0914  . montelukast (SINGULAIR) tablet 10 mg  10 mg Oral QHS Fritzi Mandes, MD   10 mg at 05/02/19 2207  . morphine 2 MG/ML injection 2 mg  2 mg Intravenous Q4H PRN Fritzi Mandes, MD   2 mg at 05/02/19 2040  . ondansetron (ZOFRAN) injection 4 mg  4 mg Intravenous Once Fritzi Mandes, MD      . ondansetron [facility]) tablet 4 mg  4 mg Oral Q6H PRN Fritzi Mandes, MD       Or  . ondansetron [facility]) injection 4 mg  4 mg Intravenous Q6H PRN Fritzi Mandes, MD   4 mg at 04/27/19 1720  . pantoprazole (PROTONIX) EC tablet 40 mg  40 mg Oral BID Dallie Piles, RPH   40 mg at 05/03/19 0913  . piperacillin-tazobactam (ZOSYN) IVPB 3.375 g  3.375 g Intravenous Q8H Epifanio Lesches, MD 12.5 mL/hr at 05/03/19 0628 3.375 g at 05/03/19 0628  . polyethylene glycol (MIRALAX / GLYCOLAX) packet 17 g  17 g Oral Daily Epifanio Lesches, MD   17 g at 04/29/19 1353  . rosuvastatin (CRESTOR) tablet 5 mg  5 mg Oral Daily Fritzi Mandes, MD   5 mg at 05/02/19 1746  . senna-docusate (Senokot-S) tablet 2 tablet  2 tablet Oral BID Mayo, Pete Pelt, MD   2 tablet at 05/02/19 1011  . sodium chloride flush (NS) 0.9 % injection 10-40 mL  10-40 mL Intracatheter Q12H Mayo, Pete Pelt, MD   10 mL at 05/02/19 2212  . sodium chloride flush (NS) 0.9 % injection 10-40 mL  10-40 mL Intracatheter PRN Mayo, Pete Pelt, MD      . sodium phosphate (FLEET) 7-19 GM/118ML enema 1 enema  1 enema Rectal Daily PRN Mayo, Pete Pelt, MD      . traMADol [facility]at[facility]bon) tablet 50 mg  50 mg Oral Q6H PRN Fritzi Mandes, MD   50 mg at 05/03/19 0913     Objective: Vital signs in last 24 hours: Temp:  [98 F (36.7 C)-98.3 F (36.8 C)] 98 F (36.7 C) (08/08 0520) Pulse Rate:  [72-78] 73 (08/08 0520) Resp:  [18] 18 (08/08 0520) BP: (115-122)/(55-59) 122/57 (08/08 0520) SpO2:  [94 %-95 %] 94 % (08/08 0520)  Intake/Output from previous day: 08/07 0701 - 08/08 0700 In: 0  Out: 1100 [Urine:1100] Intake/Output this shift: No intake/output data recorded.   Physical Exam: Mild abdominal distention.  Minimal CVA tenderness left  Lab Results:  Recent Labs    05/01/19 0650 05/02/19 0817  WBC 20.9* 18.5*  HGB 8.2* 7.8*  HCT 25.3* 25.1*  PLT 478* 521*   BMET Recent Labs    05/01/19 0650 05/02/19 0817  NA  --  134*  K  --  4.3  CL  --  102  CO2  --  23  GLUCOSE  --  176*  BUN  --  21*  CREATININE 1.29* 1.39*  CALCIUM  --  7.9*    Assessment: Medium size left intrarenal abscess however no fever or flank pain.  Do not feel her epigastric pain is related to the abscess.  Plan: Reimaging early next week    LOS: 7 days    Emily Collins 05/03/2019

## 2019-05-04 LAB — VITAMIN B12: Vitamin B-12: 1323 pg/mL — ABNORMAL HIGH (ref 180–914)

## 2019-05-04 LAB — COMPREHENSIVE METABOLIC PANEL
ALT: 135 U/L — ABNORMAL HIGH (ref 0–44)
AST: 50 U/L — ABNORMAL HIGH (ref 15–41)
Albumin: 2.5 g/dL — ABNORMAL LOW (ref 3.5–5.0)
Alkaline Phosphatase: 469 U/L — ABNORMAL HIGH (ref 38–126)
Anion gap: 7 (ref 5–15)
BUN: 18 mg/dL (ref 6–20)
CO2: 23 mmol/L (ref 22–32)
Calcium: 7.8 mg/dL — ABNORMAL LOW (ref 8.9–10.3)
Chloride: 104 mmol/L (ref 98–111)
Creatinine, Ser: 1.58 mg/dL — ABNORMAL HIGH (ref 0.44–1.00)
GFR calc Af Amer: 43 mL/min — ABNORMAL LOW (ref >60)
GFR calc non Af Amer: 37 mL/min — ABNORMAL LOW (ref >60)
Glucose, Bld: 298 mg/dL — ABNORMAL HIGH (ref 70–99)
Potassium: 4.5 mmol/L (ref 3.5–5.1)
Sodium: 134 mmol/L — ABNORMAL LOW (ref 135–145)
Total Bilirubin: 0.7 mg/dL (ref 0.3–1.2)
Total Protein: 6.8 g/dL (ref 6.5–8.1)

## 2019-05-04 LAB — GLUCOSE, CAPILLARY
Glucose-Capillary: 266 mg/dL — ABNORMAL HIGH (ref 70–99)
Glucose-Capillary: 310 mg/dL — ABNORMAL HIGH (ref 70–99)
Glucose-Capillary: 381 mg/dL — ABNORMAL HIGH (ref 70–99)
Glucose-Capillary: 336 mg/dL — ABNORMAL HIGH (ref 70–99)

## 2019-05-04 LAB — CBC
HCT: 24.2 % — ABNORMAL LOW (ref 36.0–46.0)
Hemoglobin: 7.4 g/dL — ABNORMAL LOW (ref 12.0–15.0)
MCH: 27.1 pg (ref 26.0–34.0)
MCHC: 30.6 g/dL (ref 30.0–36.0)
MCV: 88.6 fL (ref 80.0–100.0)
Platelets: 587 10*3/uL — ABNORMAL HIGH (ref 150–400)
RBC: 2.73 MIL/uL — ABNORMAL LOW (ref 3.87–5.11)
RDW: 15.1 % (ref 11.5–15.5)
WBC: 15.7 10*3/uL — ABNORMAL HIGH (ref 4.0–10.5)
nRBC: 0 % (ref 0.0–0.2)

## 2019-05-04 LAB — IRON AND TIBC
Iron: 24 ug/dL — ABNORMAL LOW (ref 28–170)
Saturation Ratios: 10 % — ABNORMAL LOW (ref 10.4–31.8)
TIBC: 243 ug/dL — ABNORMAL LOW (ref 250–450)
UIBC: 219 ug/dL

## 2019-05-04 LAB — RETICULOCYTES
Immature Retic Fract: 38.7 % — ABNORMAL HIGH (ref 2.3–15.9)
RBC.: 2.68 MIL/uL — ABNORMAL LOW (ref 3.87–5.11)
Retic Count, Absolute: 61.1 10*3/uL (ref 19.0–186.0)
Retic Ct Pct: 2.3 % (ref 0.4–3.1)

## 2019-05-04 LAB — FOLATE: Folate: 12.8 ng/mL (ref >5.9)

## 2019-05-04 LAB — FERRITIN: Ferritin: 297 ng/mL (ref 11–307)

## 2019-05-04 LAB — PROCALCITONIN: Procalcitonin: 0.42 ng/mL

## 2019-05-04 MED ORDER — SODIUM CHLORIDE 0.9 % IV SOLN
200.0000 mg | Freq: Once | INTRAVENOUS | Status: AC
  Administered 2019-05-04: 200 mg via INTRAVENOUS
  Filled 2019-05-04: qty 10

## 2019-05-04 NOTE — Progress Notes (Addendum)
[place] [facility] [place] NAME: Karrina Lye    MR#:  [id number]  DATE OF BIRTH:  Jun 11, 1966  SUBJECTIVE:   Patient had a large bowel movement with the fleets enema yesterday.  Abdominal distention and pain has improved.  She denies any lower back or flank pain.  Plan for repeat CT scan early next week to follow-up on a perinephric abscess.  Clinically feels better since yesterday.  REVIEW OF SYSTEMS:    Review of Systems  Constitutional: Negative for chills and fever.  HENT: Negative for congestion and tinnitus.   Eyes: Negative for blurred vision and double vision.  Respiratory: Negative for cough, shortness of breath and wheezing.   Cardiovascular: Negative for chest pain, orthopnea and PND.  Gastrointestinal: Negative for abdominal pain, diarrhea, nausea and vomiting.  Genitourinary: Negative for dysuria and hematuria.  Neurological: Negative for dizziness, sensory change and focal weakness.  All other systems reviewed and are negative.   Nutrition: Carb control Tolerating Diet: Yes Tolerating PT: Yes  DRUG ALLERGIES:   Allergies  Allergen Reactions  . Atorvastatin     Muscle and join pains.  Mack Hook  [Levofloxacin In D5w]     GI upset  . Simvastatin     joint aches.  . Dilaudid  [Hydromorphone Hcl] Hives, Itching and Rash  . Tetracycline Rash    VITALS:  Blood pressure 125/60, pulse 70, temperature 98.3 F (36.8 C), temperature source Oral, resp. rate 20, height 5\' 3"  (1.6 m), weight 99.7 kg, SpO2 93 %.  PHYSICAL EXAMINATION:   Physical Exam  GENERAL:  53 y.o.-year-old obese patient lying in bed in no acute distress.  EYES: Pupils equal, round, reactive to light and accommodation. No scleral icterus. Extraocular muscles intact.  HEENT: Head atraumatic, normocephalic. Oropharynx and nasopharynx clear.  NECK:  Supple, no jugular venous distention. No thyroid enlargement, no tenderness.  LUNGS: Normal breath sounds  bilaterally, no wheezing, rales, rhonchi. No use of accessory muscles of respiration.  CARDIOVASCULAR: S1, S2 normal. No murmurs, rubs, or gallops.  ABDOMEN: Soft, Tender in epigastric area, no rebound, rigidity, nondistended. Bowel sounds present. No organomegaly or mass.  EXTREMITIES: No cyanosis, clubbing or edema b/l.    NEUROLOGIC: Cranial nerves II through XII are intact. No focal Motor or sensory deficits b/l.   PSYCHIATRIC: The patient is alert and oriented x 3.  SKIN: No obvious rash, lesion, or ulcer.    LABORATORY PANEL:   CBC Recent Labs  Lab 05/04/19 0524  WBC 15.7*  HGB 7.4*  HCT 24.2*  PLT 587*   ------------------------------------------------------------------------------------------------------------------  Chemistries  Recent Labs  Lab 05/04/19 0524  NA 134*  K 4.5  CL 104  CO2 23  GLUCOSE 298*  BUN 18  CREATININE 1.58*  CALCIUM 7.8*  AST 50*  ALT 135*  ALKPHOS 469*  BILITOT 0.7   ------------------------------------------------------------------------------------------------------------------  Cardiac Enzymes No results for input(s): TROPONINI in the last 168 hours. ------------------------------------------------------------------------------------------------------------------  RADIOLOGY:  Dg Abd 1 View  Result Date: 05/03/2019 CLINICAL DATA:  Abdominal distension. EXAM: ABDOMEN - 1 VIEW COMPARISON:  CT of the abdomen and pelvis on 05/01/2019 and abdominal film on 04/30/2019 FINDINGS: Large amount of fecal material is again noted distributed throughout the colon without focal impaction or associated small bowel obstruction. No obvious free air. No abnormal calcifications. IMPRESSION: Large amount of fecal material distributed throughout the colon. Electronically Signed   By: Aletta Edouard M.D.   On: 05/03/2019 12:47  ASSESSMENT AND PLAN:   53 year old female with past medical history of diabetes, GERD, hyperlipidemia, anxiety,  osteoarthritis who presented to the hospital significant lower back pain left greater than right and also nausea vomiting and suspected to have sepsis secondary to pyelonephritis/pneumonia.  1.  Sepsis- patient ruled in on admission due to leukocytosis, CT scan findings suggestive of left-sided pyelonephritis/perinephric abscess. - Continue IV Zosyn, follow cultures which are currently negative.  - WBC count trending down.   2.  Pyelonephritis/perinephric abscess-this was noted on the CT scan of the abdomen pelvis.  Continue IV antibiotics with IV Zosyn.  Seen by urology and they discussed with interventional radiology and no plans for urgent drainage of the perinephric abscess.  T - will repeat CT abdomen/pelvis early next week.   3.  Epigastric abdominal pain-patient complaining of some epigastric pain, LFTs are stable, lipase was normal.  Abdominal CT was negative for pancreatitis/biliary pathology.   -KUB yesterday showed significant colonic stool burden.  Patient received a fleets enema and had a good response and feels a bit better today.  We will continue to monitor.  4.  Neuropathy-continue gabapentin.  5.  Depression-continue Prozac.  6.  Diabetes type 2 without complication- continue Lantus, sliding scale insulin - BS stable.   7. Hyperlipidemia - cont. Crestor.   8. Anemia -patient has a normocytic anemia.  No evidence of acute bleeding.  Iron indices are consistent with iron deficiency/possible anemia of chronic disease. -We will give some IV iron and monitor hemoglobin no acute need for transfusion.  All the records are reviewed and case discussed with Care Management/Social Worker. Management plans discussed with the patient, family and they are in agreement.  CODE STATUS: Full code  DVT Prophylaxis: Lovenox  TOTAL TIME TAKING CARE OF THIS PATIENT: 30 minutes.   POSSIBLE D/C IN 2-3 DAYS, DEPENDING ON CLINICAL CONDITION.   Houston SirenVivek J Zamya Culhane M.D on 05/04/2019 at 2:32 PM   Between 7am to 6pm - Pager - [phone]  After 6pm go to [email or web address] - Social research officer, governmentpassword [email or web address]  [facility]  Office  [phone]  CC: Primary care physician; Oswaldo ConroyBender, Abby Daneele, MD

## 2019-05-04 NOTE — Progress Notes (Signed)
Urology Consult Follow Up  Subjective: Epigastric pain better this morning.  Feels less bloated.  Afebrile, VSS.  WBC improved.  Anti-infectives: Anti-infectives (From admission, onward)   Start     Dose/Rate Route Frequency Ordered Stop   05/01/19 1800  vancomycin (VANCOCIN) IVPB 1000 mg/200 mL premix  Status:  Discontinued     1,000 mg 200 mL/hr over 60 Minutes Intravenous Every 24 hours 04/30/19 1533 05/01/19 0940   04/30/19 1600  vancomycin (VANCOCIN) 2,000 mg in sodium chloride 0.9 % 500 mL IVPB     2,000 mg 250 mL/hr over 120 Minutes Intravenous  Once 04/30/19 1533 05/01/19 0700   04/30/19 1545  piperacillin-tazobactam (ZOSYN) IVPB 3.375 g     3.375 g 12.5 mL/hr over 240 Minutes Intravenous Every 8 hours 04/30/19 1533     04/27/19 1000  azithromycin (ZITHROMAX) tablet 250 mg  Status:  Discontinued     250 mg Oral Daily 04/26/19 2040 04/30/19 1517   04/26/19 2100  cefTRIAXone (ROCEPHIN) 1 g in sodium chloride 0.9 % 100 mL IVPB  Status:  Discontinued     1 g 200 mL/hr over 30 Minutes Intravenous Every 24 hours 04/26/19 2040 04/30/19 1517      Current Facility-Administered Medications  Medication Dose Route Frequency Provider Last Rate Last Dose  . 0.9 %  sodium chloride infusion   Intravenous PRN Epifanio Lesches, MD   Stopped at 05/02/19 2032  . acetaminophen (TYLENOL) tablet 650 mg  650 mg Oral Q6H PRN Fritzi Mandes, MD       Or  . acetaminophen (TYLENOL) suppository 650 mg  650 mg Rectal Q6H PRN Fritzi Mandes, MD      . albuterol (PROVENTIL) (2.5 MG/3ML) 0.083% nebulizer solution 2.5 mg  2.5 mg Nebulization Q2H PRN Fritzi Mandes, MD      . alum & mag hydroxide-simeth (MAALOX/MYLANTA) 200-200-20 MG/5ML suspension 30 mL  30 mL Oral Q6H PRN Mayo, Pete Pelt, MD   30 mL at 04/30/19 1245   And  . lidocaine (XYLOCAINE) 2 % viscous mouth solution 15 mL  15 mL Oral Q6H PRN Mayo, Pete Pelt, MD      . aspirin EC tablet 81 mg  81 mg Oral Daily Fritzi Mandes, MD   81 mg at 05/04/19 3329  .  diphenhydrAMINE (BENADRYL) capsule 25 mg  25 mg Oral Q6H PRN Fritzi Mandes, MD      . FLUoxetine (PROZAC) capsule 40 mg  40 mg Oral Daily Fritzi Mandes, MD   40 mg at 05/04/19 0839  . fluticasone (FLONASE) 50 MCG/ACT nasal spray 1 spray  1 spray Each Nare Daily Fritzi Mandes, MD   1 spray at 05/03/19 [phone]  . gabapentin (NEURONTIN) tablet 600 mg  600 mg Oral QHS Fritzi Mandes, MD   600 mg at 05/03/19 2235  . heparin injection 5,000 Units  5,000 Units Subcutaneous Selena Lesser, MD   5,000 Units at 05/04/19 [phone]  . hydrOXYzine (ATARAX/VISTARIL) tablet 10 mg  10 mg Oral TID PRN Fritzi Mandes, MD      . insulin aspart (novoLOG) injection 0-15 Units  0-15 Units Subcutaneous TID WC Fritzi Mandes, MD   8 Units at 05/04/19 0840  . insulin aspart (novoLOG) injection 0-5 Units  0-5 Units Subcutaneous QHS Fritzi Mandes, MD   3 Units at 05/03/19 2233  . insulin glargine (LANTUS) injection 35 Units  35 Units Subcutaneous Q2200 Epifanio Lesches, MD   35 Units at 05/03/19 2232  . loratadine (CLARITIN) tablet 10 mg  10  mg Oral Daily Enedina FinnerPatel, Sona, MD   10 mg at 05/04/19 [phone]  . montelukast (SINGULAIR) tablet 10 mg  10 mg Oral QHS Enedina FinnerPatel, Sona, MD   10 mg at 05/03/19 2235  . morphine 2 MG/ML injection 2 mg  2 mg Intravenous Q4H PRN Enedina FinnerPatel, Sona, MD   2 mg at 05/03/19 1930  . ondansetron (ZOFRAN) injection 4 mg  4 mg Intravenous Once Enedina FinnerPatel, Sona, MD      . ondansetron [facility](ZOFRAN) tablet 4 mg  4 mg Oral Q6H PRN Enedina FinnerPatel, Sona, MD       Or  . ondansetron [facility](ZOFRAN) injection 4 mg  4 mg Intravenous Q6H PRN Enedina FinnerPatel, Sona, MD   4 mg at 05/03/19 1251  . pantoprazole (PROTONIX) EC tablet 40 mg  40 mg Oral BID Lowella BandyGrubb, Rodney D, RPH   40 mg at 05/04/19 [phone]  . piperacillin-tazobactam (ZOSYN) IVPB 3.375 g  3.375 g Intravenous Q8H Katha HammingKonidena, Snehalatha, MD 12.5 mL/hr at 05/04/19 0631    . polyethylene glycol (MIRALAX / GLYCOLAX) packet 17 g  17 g Oral Daily Katha HammingKonidena, Snehalatha, MD   17 g at 04/29/19 1353  . rosuvastatin (CRESTOR) tablet 5 mg  5 mg  Oral Daily Enedina FinnerPatel, Sona, MD   5 mg at 05/03/19 1709  . senna-docusate (Senokot-S) tablet 2 tablet  2 tablet Oral BID Mayo, Allyn KennerKaty Dodd, MD   2 tablet at 05/02/19 1011  . sodium chloride flush (NS) 0.9 % injection 10-40 mL  10-40 mL Intracatheter Q12H Mayo, Allyn KennerKaty Dodd, MD   10 mL at 05/02/19 2212  . sodium chloride flush (NS) 0.9 % injection 10-40 mL  10-40 mL Intracatheter PRN Mayo, Allyn KennerKaty Dodd, MD      . sodium phosphate (FLEET) 7-19 GM/118ML enema 1 enema  1 enema Rectal Daily PRN Mayo, Allyn KennerKaty Dodd, MD   1 enema at 05/03/19 1603  . traMADol (ULTRAM) tablet 50 mg  50 mg Oral Q6H PRN Enedina FinnerPatel, Sona, MD   50 mg at 05/03/19 1558     Objective: Vital signs in last 24 hours: Temp:  [97.6 F (36.4 C)-98.1 F (36.7 C)] 98.1 F (36.7 C) (08/09 0458) Pulse Rate:  [72-79] 72 (08/09 0458) Resp:  [18-20] 20 (08/09 0458) BP: (112-124)/(46-59) 112/59 (08/09 0458) SpO2:  [90 %-97 %] 90 % (08/09 0458)  Intake/Output from previous day: 08/08 0701 - 08/09 0700 In: 357.4 [I.V.:49.1; IV Piggyback:308.3] Out: 2600 [Urine:2600] Intake/Output this shift: No intake/output data recorded.   Physical Exam: In no acute distress  Lab Results:  Recent Labs    05/03/19 1226 05/04/19 0524  WBC 18.6* 15.7*  HGB 8.2* 7.4*  HCT 26.3* 24.2*  PLT 623* 587*   BMET Recent Labs    05/03/19 1226 05/04/19 0524  NA 134* 134*  K 4.2 4.5  CL 102 104  CO2 22 23  GLUCOSE 269* 298*  BUN 18 18  CREATININE 1.51* 1.58*  CALCIUM 7.4* 7.8*     Assessment: Medium intrarenal abscess on CT.  WBC improving.  Epigastric symptoms unlikely secondary to abscess.  Plan: Repeat CT earlier this week.   LOS: 8 days    Riki AltesScott C Stoioff 05/04/2019

## 2019-05-05 ENCOUNTER — Inpatient Hospital Stay: Payer: Medicaid Other

## 2019-05-05 LAB — CBC
HCT: 24.1 % — ABNORMAL LOW (ref 36.0–46.0)
Hemoglobin: 7.4 g/dL — ABNORMAL LOW (ref 12.0–15.0)
MCH: 27.1 pg (ref 26.0–34.0)
MCHC: 30.7 g/dL (ref 30.0–36.0)
MCV: 88.3 fL (ref 80.0–100.0)
Platelets: 620 10*3/uL — ABNORMAL HIGH (ref 150–400)
RBC: 2.73 MIL/uL — ABNORMAL LOW (ref 3.87–5.11)
RDW: 14.9 % (ref 11.5–15.5)
WBC: 16.2 10*3/uL — ABNORMAL HIGH (ref 4.0–10.5)
nRBC: 0 % (ref 0.0–0.2)

## 2019-05-05 LAB — GLUCOSE, CAPILLARY
Glucose-Capillary: 447 mg/dL — ABNORMAL HIGH (ref 70–99)
Glucose-Capillary: 317 mg/dL — ABNORMAL HIGH (ref 70–99)
Glucose-Capillary: 130 mg/dL — ABNORMAL HIGH (ref 70–99)
Glucose-Capillary: 122 mg/dL — ABNORMAL HIGH (ref 70–99)

## 2019-05-05 MED ORDER — IOHEXOL 240 MG/ML SOLN
25.0000 mL | INTRAMUSCULAR | Status: DC
  Administered 2019-05-05: 25 mL via ORAL

## 2019-05-05 MED ORDER — IOHEXOL 300 MG/ML  SOLN
75.0000 mL | Freq: Once | INTRAMUSCULAR | Status: AC | PRN
  Administered 2019-05-05: 75 mL via INTRAVENOUS

## 2019-05-05 MED ORDER — INSULIN ASPART 100 UNIT/ML ~~LOC~~ SOLN
8.0000 [IU] | Freq: Three times a day (TID) | SUBCUTANEOUS | Status: DC
  Administered 2019-05-06: 8 [IU] via SUBCUTANEOUS
  Filled 2019-05-05 (×3): qty 1

## 2019-05-05 MED ORDER — INSULIN GLARGINE 100 UNIT/ML ~~LOC~~ SOLN
40.0000 [IU] | Freq: Every day | SUBCUTANEOUS | Status: DC
  Administered 2019-05-05: 40 [IU] via SUBCUTANEOUS
  Filled 2019-05-05 (×2): qty 0.4

## 2019-05-05 NOTE — Progress Notes (Signed)
Inpatient Diabetes Program Recommendations  AACE/ADA: New Consensus Statement on Inpatient Glycemic Control  Target Ranges:  Prepandial:   less than 140 mg/dL      Peak postprandial:   less than 180 mg/dL (1-2 hours)      Critically ill patients:  140 - 180 mg/dL   Results for DEMETRIA, IWAI ANN (MRN [id number]) as of 05/05/2019 12:22  Ref. Range 05/04/2019 07:37 05/04/2019 12:00 05/04/2019 16:11 05/04/2019 21:57 05/05/2019 07:30  Glucose-Capillary Latest Ref Range: 70 - 99 mg/dL 266 (H) 310 (H) 381 (H) 336 (H) 447 (H)   Review of Glycemic Control  Diabetes history:DM2 Outpatient Diabetes medications:Lantus 40-45 units BID, Humalog 5 units TID with meals Current orders for Inpatient glycemic control:Lantus 35 units QHS, Novolog 0-15 units TID with meals, Novolog 0-5 units QHS, Novolog 8 units TID with meals for meal coverage  Inpatient Diabetes Program Recommendations:   Insulin-Basal: Please consider increasing Lantus to 40 units QHS.  Insulin-Meal Coverage: Noted Novolog 8 units TID with meals for meal coverage ordered today.  Thanks, Barnie Alderman, RN, MSN, CDE Diabetes Coordinator Inpatient Diabetes Program [phone] (Team Pager from 8am to 5pm)

## 2019-05-05 NOTE — Progress Notes (Signed)
Nutrition Brief Note  Patient identified for LOS  53 year old female with past medical history of diabetes, GERD, hyperlipidemia, anxiety, osteoarthritis who presented to the hospital significant lower back pain left greater than right and also nausea vomiting and suspected to have sepsis secondary to pyelonephritis/pneumonia.  Wt Readings from Last 15 Encounters:  04/26/19 99.7 kg  10/07/18 98.9 kg  08/12/18 98.9 kg  01/31/17 96.6 kg  11/14/16 95.3 kg  08/29/16 97.5 kg  05/10/16 98.4 kg  04/05/16 99.3 kg  07/08/15 96.2 kg  04/08/15 95.3 kg  12/01/14 90.7 kg    Body mass index is 38.94 kg/m. Patient meets criteria for obesity based on current BMI.   Current diet order is CHO modified, patient is consuming approximately 100% of meals at this time. Labs and medications reviewed.   No nutrition interventions warranted at this time. If nutrition issues arise, please consult RD.   Koleen Distance MS, RD, LDN Pager #- [phone] Office#- [phone] After Hours Pager: [phone]

## 2019-05-05 NOTE — Progress Notes (Signed)
[facility] -  at [facility]     PATIENT NAME: Emily Collins    MR#:  [id number]  DATE OF BIRTH:  1965-10-16  SUBJECTIVE:   Patient still complains of abdominal pain and distention, repeated CT abdomen pelvis today which is showing improvement in the perinephric abscess and decrease in stool burden.  Afebrile, white cell count still mildly elevated.  No nausea or vomiting.  REVIEW OF SYSTEMS:    Review of Systems  Constitutional: Negative for chills and fever.  HENT: Negative for congestion and tinnitus.   Eyes: Negative for blurred vision and double vision.  Respiratory: Negative for cough, shortness of breath and wheezing.   Cardiovascular: Negative for chest pain, orthopnea and PND.  Gastrointestinal: Positive for abdominal pain. Negative for diarrhea, nausea and vomiting.  Genitourinary: Negative for dysuria and hematuria.  Neurological: Negative for dizziness, sensory change and focal weakness.  All other systems reviewed and are negative.   Nutrition: Carb control Tolerating Diet: Yes Tolerating PT: Yes  DRUG ALLERGIES:   Allergies  Allergen Reactions  . Atorvastatin     Muscle and join pains.  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 Levaquin  [Levofloxacin In D5w]     GI upset  . Simvastatin     joint aches.  . Dilaudid  [Hydromorphone Hcl] Hives, Itching and Rash  . Tetracycline Rash    VITALS:  Blood pressure (!) 133/54, pulse 75, temperature 98.2 F (36.8 C), temperature source Oral, resp. rate 16, height 5\' 3"  (1.6 m), weight 99.7 kg, SpO2 92 %.  PHYSICAL EXAMINATION:   Physical Exam  GENERAL:  53 y.o.-year-old obese patient lying in bed in no acute distress.  EYES: Pupils equal, round, reactive to light and accommodation. No scleral icterus. Extraocular muscles intact.  HEENT: Head atraumatic, normocephalic. Oropharynx and nasopharynx clear.  NECK:  Supple, no jugular venous distention. No thyroid enlargement, no tenderness.  LUNGS: Normal breath sounds  bilaterally, no wheezing, rales, rhonchi. No use of accessory muscles of respiration.  CARDIOVASCULAR: S1, S2 normal. No murmurs, rubs, or gallops.  ABDOMEN: Soft, slightly tender in epigastric area, no rebound, rigidity, distended. Bowel sounds present. No organomegaly or mass.  EXTREMITIES: No cyanosis, clubbing or edema b/l.    NEUROLOGIC: Cranial nerves II through XII are intact. No focal Motor or sensory deficits b/l.   PSYCHIATRIC: The patient is alert and oriented x 3.  SKIN: No obvious rash, lesion, or ulcer.    LABORATORY PANEL:   CBC Recent Labs  Lab 05/05/19 0450  WBC 16.2*  HGB 7.4*  HCT 24.1*  PLT 620*   ------------------------------------------------------------------------------------------------------------------  Chemistries  Recent Labs  Lab 05/04/19 0524  NA 134*  K 4.5  CL 104  CO2 23  GLUCOSE 298*  BUN 18  CREATININE 1.58*  CALCIUM 7.8*  AST 50*  ALT 135*  ALKPHOS 469*  BILITOT 0.7   ------------------------------------------------------------------------------------------------------------------  Cardiac Enzymes No results for input(s): TROPONINI in the last 168 hours. ------------------------------------------------------------------------------------------------------------------  RADIOLOGY:  Ct Abdomen Pelvis W Contrast  Result Date: 05/05/2019 CLINICAL DATA:  Per physician note: Patient had a large bowel movement with the fleets enema two days ago. Abdominal distention and pain has improved. She denies any lower back or flank pain. Plan for repeat CT scan early next week to follow-up on a perinephric abscess. EXAM: CT ABDOMEN AND PELVIS WITH CONTRAST TECHNIQUE: Multidetector CT imaging of the abdomen and pelvis was performed using the standard protocol following bolus administration of intravenous contrast. CONTRAST:  75mL OMNIPAQUE IOHEXOL 300 MG/ML SOLN, 25mL  OMNIPAQUE IOHEXOL 240 MG/ML SOLN COMPARISON:  CT, 05/01/2019 FINDINGS: Lower  chest: Small to moderate right and small left pleural effusions. There is associated dependent lung base opacity consistent with atelectasis. There is also interstitial thickening with mild patchy areas of additional airspace lung opacity. Pleural effusions have enlarged since the recent prior study. Atelectasis has mildly increased. Hepatobiliary: No focal liver abnormality is seen. No gallstones, gallbladder wall thickening, or biliary dilatation. Pancreas: Unremarkable. No pancreatic ductal dilatation or surrounding inflammatory changes. Spleen: Normal in size without focal abnormality. Adrenals/Urinary Tract: No adrenal masses. Left perinephric fluid collection, contain by the renal capsule, measuring 4.2 x 1.1 cm transversely by 3.4 cm superior inferior, decreased from 4.4 x 1.5 x 3.7 cm previously. There is persistent left perinephric stranding, without significant change. Mild decrease in the MaxeysMichele enhancement of the left kidney with delayed excretion when compared to the right, similar to the prior CT. No other renal abnormalities. No hydronephrosis. Ureters are normal in course and in caliber. No ureteral stones. Bladder is unremarkable. Stomach/Bowel: Mild distention of the colon, with less solid stool and more scattered air-fluid levels. No colonic wall thickening or inflammation. Stomach and small bowel are unremarkable. Vascular/Lymphatic: Mildly enlarged left retroperitoneal lymph node measuring 1.4 cm in short axis, unchanged. No other enlarged nodes. Aortic atherosclerosis.  No aneurysm. Reproductive: Uterus and bilateral adnexa are unremarkable. Other: No abdominal wall hernia or abnormality. No abdominopelvic ascites. Musculoskeletal: No fracture or acute finding. No osteoblastic or osteolytic lesions. IMPRESSION: 1. Mild decrease in the size of the left subcapsular renal abscess. 2. No new renal abnormalities.  No obstructive uropathy. 3. Mildly enlarged left retroperitoneal lymph node, presumed  reactive, stable from the prior CT. 4. Decreased colonic stool when compared to the prior CT following enema treatment. 5. Increased size of the lung base pleural effusions, small to moderate right and small left, with mild increase in dependent atelectasis. Mild interstitial thickening at the bases is similar to the prior CT. 6. Aortic atherosclerosis. Electronically Signed   By: Amie Portlandavid  Ormond M.D.   On: 05/05/2019 14:20     ASSESSMENT AND PLAN:   53 year old female with past medical history of diabetes, GERD, hyperlipidemia, anxiety, osteoarthritis who presented to the hospital significant lower back pain left greater than right and also nausea vomiting and suspected to have sepsis secondary to pyelonephritis/pneumonia.  1.  Sepsis- patient ruled in on admission due to leukocytosis, CT scan findings suggestive of left-sided pyelonephritis/perinephric abscess. -Continue Zosyn, cultures remain negative.  White cell count still mildly elevated.  Clinically patient is improving.  2.  Pyelonephritis/perinephric abscess-this was noted on the CT scan of the abdomen pelvis on 8.6.20.  -Patient continued to have symptoms and therefore repeated a CT scan today which shows improvement in the perinephric abscess and decreased colonic stool burden. -Continue IV Zosyn for now and no plans for acute surgical intervention.  Urology following.   3.  Epigastric abdominal pain-patient still complaining of some epigastric pain, LFTs are stable, lipase was normal.   -Repeat CT abdomen pelvis does not show any acute pathology today.  Patient has decreased colonic stool burden.   -Continue supportive care with pain control, IV fluids.  4.  Neuropathy-continue gabapentin.  5.  Depression-continue Prozac.  6.  Diabetes type 2 without complication- but sugars are somewhat labile. - appreciate DM Coordinator input.  Cont. Lantus and will advance dose, Novolog with meals and SSI.    7. Hyperlipidemia - cont.  Crestor.   8. Anemia - Iron  indices consistent with Iron deficiency/Anemia of chronic disease  - s/p Iron infusion and will cont. To follow Hg.  - No acute need for transfusion.   All the records are reviewed and case discussed with Care Management/Social Worker. Management plans discussed with the patient, family and they are in agreement.  CODE STATUS: Full code  DVT Prophylaxis: Lovenox  TOTAL TIME TAKING CARE OF THIS PATIENT: 30 minutes.   POSSIBLE D/C IN 1-2 DAYS, DEPENDING ON CLINICAL CONDITION.   Henreitta Leber M.D on 05/05/2019 at 2:42 PM  Between 7am to 6pm - Pager - [phone]  After 6pm go to [email or web address] - Proofreader  [facility]  Office  [phone]  CC: Primary care physician; Letta Median, MD

## 2019-05-06 ENCOUNTER — Telehealth: Payer: Self-pay | Admitting: Pharmacy Technician

## 2019-05-06 LAB — CBC
HCT: 24.1 % — ABNORMAL LOW (ref 36.0–46.0)
Hemoglobin: 7.4 g/dL — ABNORMAL LOW (ref 12.0–15.0)
MCH: 27.2 pg (ref 26.0–34.0)
MCHC: 30.7 g/dL (ref 30.0–36.0)
MCV: 88.6 fL (ref 80.0–100.0)
Platelets: 634 10*3/uL — ABNORMAL HIGH (ref 150–400)
RBC: 2.72 MIL/uL — ABNORMAL LOW (ref 3.87–5.11)
RDW: 15.1 % (ref 11.5–15.5)
WBC: 16 10*3/uL — ABNORMAL HIGH (ref 4.0–10.5)
nRBC: 0 % (ref 0.0–0.2)

## 2019-05-06 LAB — COMPREHENSIVE METABOLIC PANEL
ALT: 74 U/L — ABNORMAL HIGH (ref 0–44)
AST: 14 U/L — ABNORMAL LOW (ref 15–41)
Albumin: 2.5 g/dL — ABNORMAL LOW (ref 3.5–5.0)
Alkaline Phosphatase: 383 U/L — ABNORMAL HIGH (ref 38–126)
Anion gap: 10 (ref 5–15)
BUN: 20 mg/dL (ref 6–20)
CO2: 23 mmol/L (ref 22–32)
Calcium: 8 mg/dL — ABNORMAL LOW (ref 8.9–10.3)
Chloride: 100 mmol/L (ref 98–111)
Creatinine, Ser: 1.43 mg/dL — ABNORMAL HIGH (ref 0.44–1.00)
GFR calc Af Amer: 48 mL/min — ABNORMAL LOW (ref >60)
GFR calc non Af Amer: 42 mL/min — ABNORMAL LOW (ref >60)
Glucose, Bld: 159 mg/dL — ABNORMAL HIGH (ref 70–99)
Potassium: 4.3 mmol/L (ref 3.5–5.1)
Sodium: 133 mmol/L — ABNORMAL LOW (ref 135–145)
Total Bilirubin: 0.3 mg/dL (ref 0.3–1.2)
Total Protein: 6.8 g/dL (ref 6.5–8.1)

## 2019-05-06 LAB — GLUCOSE, CAPILLARY
Glucose-Capillary: 142 mg/dL — ABNORMAL HIGH (ref 70–99)
Glucose-Capillary: 153 mg/dL — ABNORMAL HIGH (ref 70–99)
Glucose-Capillary: 376 mg/dL — ABNORMAL HIGH (ref 70–99)

## 2019-05-06 MED ORDER — FERROUS SULFATE 325 (65 FE) MG PO TBEC: 325.0000 mg | DELAYED_RELEASE_TABLET | Freq: Two times a day (BID) | ORAL | 1 refills | Status: DC

## 2019-05-06 MED ORDER — AMOXICILLIN-POT CLAVULANATE 875-125 MG PO TABS: 1.0000 | ORAL_TABLET | Freq: Two times a day (BID) | ORAL | 0 refills | Status: AC

## 2019-05-06 NOTE — Discharge Summary (Signed)
[facility] [place] [facility]   PATIENT NAME: Emily Collins    MR#:  [id number]  DATE OF BIRTH:  [birth year]  DATE OF ADMISSION:  04/26/2019 ADMITTING PHYSICIAN: Enedina FinnerSona Patel, MD  DATE OF DISCHARGE: 05/06/2019  PRIMARY CARE PHYSICIAN: Oswaldo ConroyBender, Abby Daneele, MD    ADMISSION DIAGNOSIS:  Hyponatremia [E87.1] Pyelonephritis [N12] Community acquired pneumonia, unspecified laterality [J18.9]  DISCHARGE DIAGNOSIS:  Active Problems:   Sepsis (HCC)   SECONDARY DIAGNOSIS:   Past Medical History:  Diagnosis Date  . Anxiety   . Arthritis   . Depression   . Diabetes mellitus without complication (HCC)   . GERD (gastroesophageal reflux disease)   . History of chicken pox 04/07/2015   DID have Chicken Pox.    . Hyperlipidemia   . Hypertension   . Venous (peripheral) insufficiency     HOSPITAL COURSE:   53 year old female with past medical history of diabetes, GERD, hyperlipidemia, anxiety, osteoarthritis who presented to the hospital significant lower back pain left greater than right and also nausea vomiting and suspected to have sepsis secondary to pyelonephritis/pneumonia.  1.  Sepsis- patient ruled in on admission due to leukocytosis, CT scan findings suggestive of left-sided pyelonephritis/perinephric abscess. -Patient was treated with IV Zosyn while in the hospital, she has clinically improved.  She has a mild leukocytosis which is persistent but she is afebrile and hemodynamically stable. -She is being discharged on oral Augmentin for additional 10 more days.  2.  Pyelonephritis/perinephric abscess-this was noted on the CT scan of the abdomen pelvis on 8.6.20.  - Urology consult was obtained and recommended IV antibiotics and repeat imaging which was done on May 05, 2019.  Repeat imaging shows improvement in the perinephric stranding and abscess. - Since she has clinically improved there is no acute need for drainage and she will be discharged on oral  antibiotics with close follow-up with urology.  Discussed this with urologist Dr. Virl DiamondStoiff prior to discharge.  3.  Epigastric abdominal pain- this was secondary to some constipation.  Patient had a CT which showed moderate stool burden in the colon and a KUB that confirmed it.  Patient was given stool softeners and also a fleets enema with good response.  She has clinically improved. -She will continue stool softeners at home.  FTs and lipase were negative for any evidence of acute biliary pathology or pancreatitis.  4.  Neuropathy- ot. Will continue gabapentin.  5.  Depression- pt. Will continue Prozac.  6.  Diabetes type 2 without complication-  blood sugars were somewhat labile in the hospital and a diabetes coordinator consult was obtained.  Her insulin was adjusted while in the hospital.  She will resume her Levemir and NovoLog with meals as outpatient.  7. Hyperlipidemia - she will cont. Crestor.   8. Anemia -she is a normocytic anemia but patient's iron indices were consistent with iron deficiency.  She did not require any transfusion.  Hemoglobin is stable at 7.4 upon discharge. -I did refer her to hematology oncology as an outpatient for follow-up for her anemia.  DISCHARGE CONDITIONS:   Stable.   CONSULTS OBTAINED:  Treatment Team:  Riki AltesStoioff, Scott C, MD  DRUG ALLERGIES:   Allergies  Allergen Reactions  . Atorvastatin     Muscle and join pains.  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 Levaquin  [Levofloxacin In D5w]     GI upset  . Simvastatin     joint aches.  . Dilaudid  [Hydromorphone Hcl] Hives, Itching and Rash  . Tetracycline Rash  DISCHARGE MEDICATIONS:   Allergies as of 05/06/2019      Reactions   Atorvastatin    Muscle and join pains.   Levaquin  [levofloxacin In D5w]    GI upset   Simvastatin    joint aches.   Dilaudid  [hydromorphone Hcl] Hives, Itching, Rash   Tetracycline Rash      Medication List    TAKE these medications   amoxicillin-clavulanate 875-125 MG  tablet Commonly known as: AUGMENTIN Take 1 tablet by mouth 2 (two) times daily for 10 days.   aspirin 81 MG tablet Take 81 mg by mouth daily.   cetirizine 10 MG tablet Commonly known as: ZYRTEC TAKE 1 TABLET(10 MG) BY MOUTH DAILY What changed: See the new instructions.   ferrous sulfate 325 (65 FE) MG EC tablet Take 1 tablet (325 mg total) by mouth 2 (two) times daily.   FLUoxetine 40 MG capsule Commonly known as: PROZAC TAKE 1 CAPSULE BY MOUTH EVERY DAY What changed:   how much to take  additional instructions   fluticasone 50 MCG/ACT nasal spray Commonly known as: FLONASE SHAKE WELL AND USE 2 SPRAYS IN EACH NOSTRIL DAILY   furosemide 40 MG tablet Commonly known as: LASIX TAKE 1 TABLET BY MOUTH AS NEEDED FOR SWELLING What changed: See the new instructions.   gabapentin 600 MG tablet Commonly known as: NEURONTIN Take 600 mg by mouth at bedtime.   HumaLOG KwikPen 100 UNIT/ML KwikPen Generic drug: insulin lispro 5 Units 3 (three) times daily with meals. INJ UP TO 100 UNI Dalton PER DAY AS PER MD INSTRUCTIONS   hydrOXYzine 10 MG tablet Commonly known as: ATARAX/VISTARIL Take 10 mg by mouth 3 (three) times daily as needed.   Lantus SoloStar 100 UNIT/ML Solostar Pen Generic drug: Insulin Glargine INJECT 40 UNITS UNDER THE SKIN TWICE DAILY What changed: See the new instructions.   meloxicam 15 MG tablet Commonly known as: MOBIC TAKE 1 TABLET(15 MG) BY MOUTH DAILY WITH FOOD AS NEEDED FOR ARTHRITIS PAIN   montelukast 10 MG tablet Commonly known as: SINGULAIR TAKE 1 TABLET BY MOUTH AT BEDTIME   ProAir HFA 108 (90 Base) MCG/ACT inhaler Generic drug: albuterol INHALE 2 PUFFS INTO THE LUNGS EVERY 6 HOURS AS NEEDED FOR WHEEZING OR SHORTNESS OF BREATH   rosuvastatin 5 MG tablet Commonly known as: CRESTOR TAKE 1 TABLET(5 MG) BY MOUTH DAILY         DISCHARGE INSTRUCTIONS:   DIET:  Cardiac diet and Diabetic diet  DISCHARGE CONDITION:  Stable  ACTIVITY:   Activity as tolerated  OXYGEN:  Home Oxygen: No.   Oxygen Delivery: room air  DISCHARGE LOCATION:  home   If you experience worsening of your admission symptoms, develop shortness of breath, life threatening emergency, suicidal or homicidal thoughts you must seek medical attention immediately by calling 911 or calling your MD immediately  if symptoms less severe.  You Must read complete instructions/literature along with all the possible adverse reactions/side effects for all the Medicines you take and that have been prescribed to you. Take any new Medicines after you have completely understood and accpet all the possible adverse reactions/side effects.   Please note  You were cared for by a hospitalist during your hospital stay. If you have any questions about your discharge medications or the care you received while you were in the hospital after you are discharged, you can call the unit and asked to speak with the hospitalist on call if the hospitalist that took care of you  is not available. Once you are discharged, your primary care physician will handle any further medical issues. Please note that NO REFILLS for any discharge medications will be authorized once you are discharged, as it is imperative that you return to your primary care physician (or establish a relationship with a primary care physician if you do not have one) for your aftercare needs so that they can reassess your need for medications and monitor your lab values.     Today   Still having some abdominal bloating but much improved.  Patient had a bowel movement this morning.  Still has a mild leukocytosis but is afebrile.  CT scan yesterday showed improvement in the perinephric stranding and the abscess.  Will discharge home today with outpatient follow-up with urology.  VITAL SIGNS:  Blood pressure (!) 127/59, pulse 73, temperature 98.2 F (36.8 C), temperature source Oral, resp. rate 20, height 5\' 3"  (1.6 m),  weight 99.7 kg, SpO2 95 %.  I/O:    Intake/Output Summary (Last 24 hours) at 05/06/2019 1446 Last data filed at 05/06/2019 1344 Gross per 24 hour  Intake 783.7 ml  Output 0 ml  Net 783.7 ml    PHYSICAL EXAMINATION:   GENERAL:  53 y.o.-year-old obese patient lying in bed in no acute distress.  EYES: Pupils equal, round, reactive to light and accommodation. No scleral icterus. Extraocular muscles intact.  HEENT: Head atraumatic, normocephalic. Oropharynx and nasopharynx clear.  NECK:  Supple, no jugular venous distention. No thyroid enlargement, no tenderness.  LUNGS: Normal breath sounds bilaterally, no wheezing, rales, rhonchi. No use of accessory muscles of respiration.  CARDIOVASCULAR: S1, S2 normal. No murmurs, rubs, or gallops.  ABDOMEN: Soft,  NT, no rebound, rigidity, slightly distended. Bowel sounds present. No organomegaly or mass.  EXTREMITIES: No cyanosis, clubbing or edema b/l.    NEUROLOGIC: Cranial nerves II through XII are intact. No focal Motor or sensory deficits b/l.   PSYCHIATRIC: The patient is alert and oriented x 3.  SKIN: No obvious rash, lesion, or ulcer.   DATA REVIEW:   CBC Recent Labs  Lab 05/06/19 0658  WBC 16.0*  HGB 7.4*  HCT 24.1*  PLT 634*    Chemistries  Recent Labs  Lab 05/06/19 0658  NA 133*  K 4.3  CL 100  CO2 23  GLUCOSE 159*  BUN 20  CREATININE 1.43*  CALCIUM 8.0*  AST 14*  ALT 74*  ALKPHOS 383*  BILITOT 0.3    Cardiac Enzymes No results for input(s): TROPONINI in the last 168 hours.  Microbiology Results  Results for orders placed or performed during the hospital encounter of 04/26/19  SARS Coronavirus 2 [facility] order, Performed in [facility] hospital lab) Nasopharyngeal     Status: None   Collection Time: 04/26/19  5:07 PM   Specimen: Nasopharyngeal  Result Value Ref Range Status   SARS Coronavirus 2 NEGATIVE NEGATIVE Final    Comment: (NOTE) If result is NEGATIVE SARS-CoV-2 target nucleic acids are NOT  DETECTED. The SARS-CoV-2 RNA is generally detectable in upper and lower  respiratory specimens during the acute phase of infection. The lowest  concentration of SARS-CoV-2 viral copies this assay can detect is 250  copies / mL. A negative result does not preclude SARS-CoV-2 infection  and should not be used as the sole basis for treatment or other  patient management decisions.  A negative result may occur with  improper specimen collection / handling, submission of specimen other  than nasopharyngeal swab, presence of viral  mutation(s) within the  areas targeted by this assay, and inadequate number of viral copies  (<250 copies / mL). A negative result must be combined with clinical  observations, patient history, and epidemiological information. If result is POSITIVE SARS-CoV-2 target nucleic acids are DETECTED. The SARS-CoV-2 RNA is generally detectable in upper and lower  respiratory specimens dur ing the acute phase of infection.  Positive  results are indicative of active infection with SARS-CoV-2.  Clinical  correlation with patient history and other diagnostic information is  necessary to determine patient infection status.  Positive results do  not rule out bacterial infection or co-infection with other viruses. If result is PRESUMPTIVE POSTIVE SARS-CoV-2 nucleic acids MAY BE PRESENT.   A presumptive positive result was obtained on the submitted specimen  and confirmed on repeat testing.  While 2019 novel coronavirus  (SARS-CoV-2) nucleic acids may be present in the submitted sample  additional confirmatory testing may be necessary for epidemiological  and / or clinical management purposes  to differentiate between  SARS-CoV-2 and other Sarbecovirus currently known to infect humans.  If clinically indicated additional testing with an alternate test  methodology [phone]) is advised. The SARS-CoV-2 RNA is generally  detectable in upper and lower respiratory sp ecimens during  the acute  phase of infection. The expected result is Negative. Fact Sheet for Patients:  BoilerBrush.com.cy Fact Sheet for Healthcare Providers: https://pope.com/ This test is not yet approved or cleared by the [place] FDA and has been authorized for detection and/or diagnosis of SARS-CoV-2 by FDA under an Emergency Use Authorization (EUA).  This EUA will remain in effect (meaning this test can be used) for the duration of the COVID-19 declaration under Section 564(b)(1) of the Act, 21 U.S.C. section 360bbb-3(b)(1), unless the authorization is terminated or revoked sooner. Performed at [facility], [address] [place], [place] [address]   Urine culture     Status: Abnormal   Collection Time: 04/26/19  5:10 PM   Specimen: Urine, Random  Result Value Ref Range Status   Specimen Description   Final    URINE, RANDOM Performed at [facility], [address] [place], [place] [address]    Special Requests   Final    NONE Performed at [facility], [address] [place], [place] [address]    Culture (A)  Final    <10,000 COLONIES/mL INSIGNIFICANT GROWTH Performed at [facility], [address], [place], [place] [address]    Report Status 04/28/2019 FINAL  Final  MRSA PCR Screening     Status: None   Collection Time: 05/01/19  5:03 AM   Specimen: Nasal Mucosa; Nasopharyngeal  Result Value Ref Range Status   MRSA by PCR NEGATIVE NEGATIVE Final    Comment:        The GeneXpert MRSA Assay (FDA approved for NASAL specimens only), is one component of a comprehensive MRSA colonization surveillance program. It is not intended to diagnose MRSA infection nor to guide or monitor treatment for MRSA infections. Performed at [facility], [address] [place], [place] [address]   Expectorated sputum assessment w rflx to resp cult     Status: None   Collection Time: 05/01/19   7:23 AM   Specimen: Expectorated Sputum  Result Value Ref Range Status   Specimen Description EXPECTORATED SPUTUM  Final   Special Requests Normal  Final   Sputum evaluation   Final    Sputum specimen not acceptable for testing.  Please  recollect.   NOTIFIED BETH RICHARDSON AT 0900 ON 05/01/2019 JJB Performed at [facility], [address] [place], [place] [address]    Report Status 05/01/2019 FINAL  Final    RADIOLOGY:  Ct Abdomen Pelvis W Contrast  Result Date: 05/05/2019 CLINICAL DATA:  Per physician note: Patient had a large bowel movement with the fleets enema two days ago. Abdominal distention and pain has improved. She denies any lower back or flank pain. Plan for repeat CT scan early next week to follow-up on a perinephric abscess. EXAM: CT ABDOMEN AND PELVIS WITH CONTRAST TECHNIQUE: Multidetector CT imaging of the abdomen and pelvis was performed using the standard protocol following bolus administration of intravenous contrast. CONTRAST:  75mL OMNIPAQUE IOHEXOL 300 MG/ML SOLN, 25mL OMNIPAQUE IOHEXOL 240 MG/ML SOLN COMPARISON:  CT, 05/01/2019 FINDINGS: Lower chest: Small to moderate right and small left pleural effusions. There is associated dependent lung base opacity consistent with atelectasis. There is also interstitial thickening with mild patchy areas of additional airspace lung opacity. Pleural effusions have enlarged since the recent prior study. Atelectasis has mildly increased. Hepatobiliary: No focal liver abnormality is seen. No gallstones, gallbladder wall thickening, or biliary dilatation. Pancreas: Unremarkable. No pancreatic ductal dilatation or surrounding inflammatory changes. Spleen: Normal in size without focal abnormality. Adrenals/Urinary Tract: No adrenal masses. Left perinephric fluid collection, contain by the renal capsule, measuring 4.2 x 1.1 cm transversely by 3.4 cm superior inferior, decreased from 4.4 x 1.5 x 3.7 cm previously. There is persistent  left perinephric stranding, without significant change. Mild decrease in the [place] enhancement of the left kidney with delayed excretion when compared to the right, similar to the prior CT. No other renal abnormalities. No hydronephrosis. Ureters are normal in course and in caliber. No ureteral stones. Bladder is unremarkable. Stomach/Bowel: Mild distention of the colon, with less solid stool and more scattered air-fluid levels. No colonic wall thickening or inflammation. Stomach and small bowel are unremarkable. Vascular/Lymphatic: Mildly enlarged left retroperitoneal lymph node measuring 1.4 cm in short axis, unchanged. No other enlarged nodes. Aortic atherosclerosis.  No aneurysm. Reproductive: Uterus and bilateral adnexa are unremarkable. Other: No abdominal wall hernia or abnormality. No abdominopelvic ascites. Musculoskeletal: No fracture or acute finding. No osteoblastic or osteolytic lesions. IMPRESSION: 1. Mild decrease in the size of the left subcapsular renal abscess. 2. No new renal abnormalities.  No obstructive uropathy. 3. Mildly enlarged left retroperitoneal lymph node, presumed reactive, stable from the prior CT. 4. Decreased colonic stool when compared to the prior CT following enema treatment. 5. Increased size of the lung base pleural effusions, small to moderate right and small left, with mild increase in dependent atelectasis. Mild interstitial thickening at the bases is similar to the prior CT. 6. Aortic atherosclerosis. Electronically Signed   By: Amie Portlandavid  Ormond M.D.   On: 05/05/2019 14:20      Management plans discussed with the patient, family and they are in agreement.  CODE STATUS:     Code Status Orders  (From admission, onward)         Start     Ordered   04/26/19 2040  Full code  Continuous     04/26/19 2040        Code Status History    This patient has a current code status but no historical code status.   Advance Care Planning Activity      TOTAL TIME  TAKING CARE OF THIS PATIENT: 40 minutes.    Houston SirenVivek J Adreanna Fickel M.D on 05/06/2019  at 2:46 PM  Between 7am to 6pm - Pager - [phone]  After 6pm go to [email or web address] - Social research officer, [email or web address] [email or web address]  [facility]  Office  [phone]  CC: Primary care physician; Oswaldo ConroyBender, Abby Daneele, MD

## 2019-05-06 NOTE — Discharge Instructions (Signed)
Blood Glucose Monitoring, Adult °Monitoring your blood sugar (glucose) is an important part of managing your diabetes (diabetes mellitus). Blood glucose monitoring involves checking your blood glucose as often as directed and keeping a record (log) of your results over time. °Checking your blood glucose regularly and keeping a blood glucose log can: °· Help you and your health care provider adjust your diabetes management plan as needed, including your medicines or insulin. °· Help you understand how food, exercise, illnesses, and medicines affect your blood glucose. °· Let you know what your blood glucose is at any time. You can quickly find out if you have low blood glucose (hypoglycemia) or high blood glucose (hyperglycemia). °Your health care provider will set individualized treatment goals for you. Your goals will be based on your age, other medical conditions you have, and how you respond to diabetes treatment. Generally, the goal of treatment is to maintain the following blood glucose levels: °· Before meals (preprandial): 80-130 mg/dL (4.4-7.2 mmol/L). °· After meals (postprandial): below 180 mg/dL (10 mmol/L). °· A1c level: less than 7%. °Supplies needed: °· Blood glucose meter. °· Test strips for your meter. Each meter has its own strips. You must use the strips that came with your meter. °· A needle to prick your finger (lancet). Do not use a lancet more than one time. °· A device that holds the lancet (lancing device). °· A journal or log book to write down your results. °How to check your blood glucose ° °1. Wash your hands with soap and water. °2. Prick the side of your finger (not the tip) with the lancet. Use a different finger each time. °3. Gently rub the finger until a small drop of blood appears. °4. Follow instructions that come with your meter for inserting the test strip, applying blood to the strip, and using your blood glucose meter. °5. Write down your result and any notes. °Some meters  allow you to use areas of your body other than your finger (alternative sites) to test your blood. The most common alternative sites are: °· Forearm. °· Thigh. °· Palm of the hand. °If you think you may have hypoglycemia, or if you have a history of not knowing when your blood glucose is getting low (hypoglycemia unawareness), do not use alternative sites. Use your finger instead. Alternative sites may not be as accurate as the fingers, because blood flow is slower in these areas. This means that the result you get may be delayed, and it may be different from the result that you would get from your finger. °Follow these instructions at home: °Blood glucose log ° °· Every time you check your blood glucose, write down your result. Also write down any notes about things that may be affecting your blood glucose, such as your diet and exercise for the day. This information can help you and your health care provider: °? Look for patterns in your blood glucose over time. °? Adjust your diabetes management plan as needed. °· Check if your meter allows you to download your records to a computer. Most glucose meters store a record of glucose readings in the meter. °If you have type 1 diabetes: °· Check your blood glucose 2 or more times a day. °· Also check your blood glucose: °? Before every insulin injection. °? Before and after exercise. °? Before meals. °? 2 hours after a meal. °? Occasionally between 2:00 a.m. and 3:00 a.m., as directed. °? Before potentially dangerous tasks, like driving or using heavy machinery. °?   At bedtime.  You may need to check your blood glucose more often, up to 6-10 times a day, if you: ? Use an insulin pump. ? Need multiple daily injections (MDI). ? Have diabetes that is not well-controlled. ? Are ill. ? Have a history of severe hypoglycemia. ? Have hypoglycemia unawareness. If you have type 2 diabetes:  If you take insulin or other diabetes medicines, check your blood glucose 2 or  more times a day.  If you are on intensive insulin therapy, check your blood glucose 4 or more times a day. Occasionally, you may also need to check between 2:00 a.m. and 3:00 a.m., as directed.  Also check your blood glucose: ? Before and after exercise. ? Before potentially dangerous tasks, like driving or using heavy machinery.  You may need to check your blood glucose more often if: ? Your medicine is being adjusted. ? Your diabetes is not well-controlled. ? You are ill. General tips  Always keep your supplies with you.  If you have questions or need help, all blood glucose meters have a 24-hour "hotline" phone number that you can call. You may also contact your health care provider.  After you use a few boxes of test strips, adjust (calibrate) your blood glucose meter by following instructions that came with your meter. Contact a health care provider if:  Your blood glucose is at or above 240 mg/dL (13.3 mmol/L) for 2 days in a row.  You have been sick or have had a fever for 2 days or longer, and you are not getting better.  You have any of the following problems for more than 6 hours: ? You cannot eat or drink. ? You have nausea or vomiting. ? You have diarrhea. Get help right away if:  Your blood glucose is lower than 54 mg/dL (3 mmol/L).  You become confused or you have trouble thinking clearly.  You have difficulty breathing.  You have moderate or large ketone levels in your urine. Summary  Monitoring your blood sugar (glucose) is an important part of managing your diabetes (diabetes mellitus).  Blood glucose monitoring involves checking your blood glucose as often as directed and keeping a record (log) of your results over time.  Your health care provider will set individualized treatment goals for you. Your goals will be based on your age, other medical conditions you have, and how you respond to diabetes treatment.  Every time you check your blood glucose,  write down your result. Also write down any notes about things that may be affecting your blood glucose, such as your diet and exercise for the day. This information is not intended to replace advice given to you by your health care provider. Make sure you discuss any questions you have with your health care provider. Document Released: 09/14/2003 Document Revised: 07/05/2018 Document Reviewed: 02/21/2016 Elsevier Patient Education  [place].   Abdominal Bloating When you have abdominal bloating, your abdomen may feel full, tight, or painful. It may also look bigger than normal or swollen (distended). Common causes of abdominal bloating include:  Swallowing air.  Constipation.  Problems digesting food.  Eating too much.  Irritable bowel syndrome. This is a condition that affects the large intestine.  Lactose intolerance. This is an inability to digest lactose, a natural sugar in dairy products.  Celiac disease. This is a condition that affects the ability to digest gluten, a protein found in some grains.  Gastroparesis. This is a condition that slows down the movement  of food in the stomach and small intestine. It is more common in people with diabetes mellitus.  Gastroesophageal reflux disease (GERD). This is a digestive condition that makes stomach acid flow back into the esophagus.  Urinary retention. This means that the body is holding onto urine, and the bladder cannot be emptied all the way. Follow these instructions at home: Eating and drinking  Avoid eating too much.  Try not to swallow air while talking or eating.  Avoid eating while lying down.  Avoid these foods and drinks: ? Foods that cause gas, such as broccoli, cabbage, cauliflower, and baked beans. ? Carbonated drinks. ? Hard candy. ? Chewing gum. Medicines  Take over-the-counter and prescription medicines only as told by your health care provider.  Take probiotic medicines. These medicines  contain live bacteria or yeasts that can help digestion.  Take coated peppermint oil capsules. Activity  Try to exercise regularly. Exercise may help to relieve bloating that is caused by gas and relieve constipation. General instructions  Keep all follow-up visits as told by your health care provider. This is important. Contact a health care provider if:  You have nausea and vomiting.  You have diarrhea.  You have abdominal pain.  You have unusual weight loss or weight gain.  You have severe pain, and medicines do not help. Get help right away if:  You have severe chest pain.  You have trouble breathing.  You have shortness of breath.  You have trouble urinating.  You have darker urine than normal.  You have blood in your stools or have dark, tarry stools. Summary  Abdominal bloating means that the abdomen is swollen.  Common causes of abdominal bloating are swallowing air, constipation, and problems digesting food.  Avoid eating too much and avoid swallowing air.  Avoid foods that cause gas, carbonated drinks, hard candy, and chewing gum. This information is not intended to replace advice given to you by your health care provider. Make sure you discuss any questions you have with your health care provider. Document Released: 10/13/2016 Document Revised: 12/30/2018 Document Reviewed: 10/13/2016 Elsevier Patient Education  2020 [employer]

## 2019-05-06 NOTE — Telephone Encounter (Signed)
Received 2020 proof of income.  Patient eligible to receive medication assistance at [facility] as long as eligibility requirements continue to be met.  [facility]

## 2019-05-06 NOTE — Plan of Care (Signed)
Patient cleared for discharge. F/u appointments scheduled. Prescriptions printed and faxed to medication management. IV removed. Belongings gathered. AVS printed and D/c instructions given. All questions answered for patient clarification. Education complete. Patient d/c to home via POV.

## 2019-05-06 NOTE — Progress Notes (Signed)
Urology Inpatient Progress Note  Subjective: Emily Collins is a 53 y.o. female with PMH DMII who presented to the emergency department on 04/26/2019 with complaints of left flank pain and vomiting x3 days.  CT stone study at that time consistent with left pyelonephritis, also with bilateral lower lobe pneumonia.  Patient was started on azithromycin and IV ceftriaxone but complained of persistent epigastric and right upper quadrant pain.  CT abdomen pelvis with contrast on 05/01/2019 with interval development of a 4.4 x 1.5 x 3.7 cm perinephric abscess.  Interventional radiology decided to hold percutaneous aspiration at that time, as the abscess was <5 cm.  She was switched to IV Zosyn and vancomycin for broad-spectrum coverage and medical management of this abscess.  Patient has continued to complain of epigastric pain and bloating despite treatment.  Repeat CT abdomen pelvis with contrast on 05/05/2019 showed marginal improvement in abscess size, new dimensions 4.2 x 1.1 x 3.4 cm.  White count remains elevated at 16.0 today, creatinine 1.43.  She remains afebrile with intermittent diastolic hypotension to the 50s.  Anti-infectives: Anti-infectives (From admission, onward)   Start     Dose/Rate Route Frequency Ordered Stop   05/01/19 1800  vancomycin (VANCOCIN) IVPB 1000 mg/200 mL premix  Status:  Discontinued     1,000 mg 200 mL/hr over 60 Minutes Intravenous Every 24 hours 04/30/19 1533 05/01/19 0940   04/30/19 1600  vancomycin (VANCOCIN) 2,000 mg in sodium chloride 0.9 % 500 mL IVPB     2,000 mg 250 mL/hr over 120 Minutes Intravenous  Once 04/30/19 1533 05/01/19 0700   04/30/19 1545  piperacillin-tazobactam (ZOSYN) IVPB 3.375 g     3.375 g 12.5 mL/hr over 240 Minutes Intravenous Every 8 hours 04/30/19 1533     04/27/19 1000  azithromycin (ZITHROMAX) tablet 250 mg  Status:  Discontinued     250 mg Oral Daily 04/26/19 2040 04/30/19 1517   04/26/19 2100  cefTRIAXone (ROCEPHIN) 1 g in sodium  chloride 0.9 % 100 mL IVPB  Status:  Discontinued     1 g 200 mL/hr over 30 Minutes Intravenous Every 24 hours 04/26/19 2040 04/30/19 1517      Current Facility-Administered Medications  Medication Dose Route Frequency Provider Last Rate Last Dose  . 0.9 %  sodium chloride infusion   Intravenous PRN Epifanio Lesches, MD   Stopped at 05/05/19 1913  . acetaminophen (TYLENOL) tablet 650 mg  650 mg Oral Q6H PRN Fritzi Mandes, MD       Or  . acetaminophen (TYLENOL) suppository 650 mg  650 mg Rectal Q6H PRN Fritzi Mandes, MD      . albuterol (PROVENTIL) (2.5 MG/3ML) 0.083% nebulizer solution 2.5 mg  2.5 mg Nebulization Q2H PRN Fritzi Mandes, MD   2.5 mg at 05/04/19 1452  . alum & mag hydroxide-simeth (MAALOX/MYLANTA) 200-200-20 MG/5ML suspension 30 mL  30 mL Oral Q6H PRN Mayo, Pete Pelt, MD   30 mL at 04/30/19 1245   And  . lidocaine (XYLOCAINE) 2 % viscous mouth solution 15 mL  15 mL Oral Q6H PRN Mayo, Pete Pelt, MD      . aspirin EC tablet 81 mg  81 mg Oral Daily Fritzi Mandes, MD   81 mg at 05/06/19 0824  . diphenhydrAMINE (BENADRYL) capsule 25 mg  25 mg Oral Q6H PRN Fritzi Mandes, MD      . FLUoxetine (PROZAC) capsule 40 mg  40 mg Oral Daily Fritzi Mandes, MD   40 mg at 05/06/19 0825  .  fluticasone (FLONASE) 50 MCG/ACT nasal spray 1 spray  1 spray Each Nare Daily Enedina FinnerPatel, Sona, MD   1 spray at 05/06/19 0827  . gabapentin (NEURONTIN) tablet 600 mg  600 mg Oral QHS Enedina FinnerPatel, Sona, MD   600 mg at 05/05/19 2136  . heparin injection 5,000 Units  5,000 Units Subcutaneous Q8H Enedina FinnerPatel, Sona, MD   5,000 Units at 05/06/19 0526  . hydrOXYzine (ATARAX/VISTARIL) tablet 10 mg  10 mg Oral TID PRN Enedina FinnerPatel, Sona, MD      . insulin aspart (novoLOG) injection 0-15 Units  0-15 Units Subcutaneous TID [facility] FinnerPatel, Sona, MD   3 Units at 05/06/19 0826  . insulin aspart (novoLOG) injection 0-5 Units  0-5 Units Subcutaneous QHS Enedina FinnerPatel, Sona, MD   5 Units at 05/04/19 2203  . insulin aspart (novoLOG) injection 8 Units  8 Units Subcutaneous  TID WC Sainani, Vivek J, MD      . insulin glargine (LANTUS) injection 40 Units  40 Units Subcutaneous Q2200 [place] SirenSainani, Vivek J, MD   40 Units at 05/05/19 2145  . loratadine (CLARITIN) tablet 10 mg  10 mg Oral Daily Enedina FinnerPatel, Sona, MD   10 mg at 05/06/19 0824  . montelukast (SINGULAIR) tablet 10 mg  10 mg Oral QHS Enedina FinnerPatel, Sona, MD   10 mg at 05/05/19 2136  . morphine 2 MG/ML injection 2 mg  2 mg Intravenous Q4H PRN Enedina FinnerPatel, Sona, MD   2 mg at 05/06/19 0238  . ondansetron (ZOFRAN) tablet 4 mg  4 mg Oral Q6H PRN Enedina FinnerPatel, Sona, MD       Or  . ondansetron [facility](ZOFRAN) injection 4 mg  4 mg Intravenous Q6H PRN Enedina FinnerPatel, Sona, MD   4 mg at 05/03/19 1251  . pantoprazole (PROTONIX) EC tablet 40 mg  40 mg Oral BID Lowella BandyGrubb, Rodney D, RPH   40 mg at 05/06/19 [id number]  . piperacillin-tazobactam (ZOSYN) IVPB 3.375 g  3.375 g Intravenous Q8H Katha HammingKonidena, Snehalatha, MD 12.5 mL/hr at 05/06/19 0600    . polyethylene glycol (MIRALAX / GLYCOLAX) packet 17 g  17 g Oral Daily Katha HammingKonidena, Snehalatha, MD   17 g at 04/29/19 1353  . rosuvastatin (CRESTOR) tablet 5 mg  5 mg Oral Daily Enedina FinnerPatel, Sona, MD   5 mg at 05/05/19 1744  . senna-docusate (Senokot-S) tablet 2 tablet  2 tablet Oral BID Mayo, Allyn KennerKaty Dodd, MD   2 tablet at 05/02/19 1011  . sodium chloride flush (NS) 0.9 % injection 10-40 mL  10-40 mL Intracatheter Q12H Mayo, Allyn KennerKaty Dodd, MD   10 mL at 05/05/19 2144  . sodium chloride flush (NS) 0.9 % injection 10-40 mL  10-40 mL Intracatheter PRN Mayo, Allyn KennerKaty Dodd, MD      . sodium phosphate (FLEET) 7-19 GM/118ML enema 1 enema  1 enema Rectal Daily PRN Mayo, Allyn KennerKaty Dodd, MD   1 enema at 05/03/19 1603  . traMADol (ULTRAM) tablet 50 mg  50 mg Oral Q6H PRN Enedina FinnerPatel, Sona, MD   50 mg at 05/06/19 0824     Objective: Vital signs in last 24 hours: Temp:  [98.2 F (36.8 C)-98.4 F (36.9 C)] 98.2 F (36.8 C) (08/11 0457) Pulse Rate:  [72-74] 73 (08/11 0457) Resp:  [17-20] 20 (08/11 0457) BP: (122-129)/(59-67) 127/59 (08/11 0457) SpO2:  [95 %] 95 % (08/11  0457)  Intake/Output from previous day: 08/10 0701 - 08/11 0700 In: 783.7 [P.O.:598; I.V.:17.5; IV Piggyback:168.2] Out: 0  Intake/Output this shift: Total I/O In: 240 [P.O.:240] Out: -   Physical Exam Vitals signs  and nursing note reviewed.  Constitutional:      General: She is sleeping. She is not in acute distress.    Appearance: She is not ill-appearing, toxic-appearing or diaphoretic.  HENT:     Head: Normocephalic and atraumatic.  Pulmonary:     Effort: Pulmonary effort is normal. No respiratory distress.  Abdominal:     General: There is distension.     Tenderness: There is abdominal tenderness (epigastric). There is no guarding or rebound.  Neurological:     Mental Status: She is easily aroused.  Psychiatric:        Mood and Affect: Mood and affect normal.    Lab Results:  Recent Labs    05/05/19 0450 05/06/19 0658  WBC 16.2* 16.0*  HGB 7.4* 7.4*  HCT 24.1* 24.1*  PLT 620* 634*   BMET Recent Labs    05/04/19 0524 05/06/19 0658  NA 134* 133*  K 4.5 4.3  CL 104 100  CO2 23 23  GLUCOSE 298* 159*  BUN 18 20  CREATININE 1.58* 1.43*  CALCIUM 7.8* 8.0*   Studies/Results: Ct Abdomen Pelvis W Contrast  Result Date: 05/05/2019 CLINICAL DATA:  Per physician note: Patient had a large bowel movement with the fleets enema two days ago. Abdominal distention and pain has improved. She denies any lower back or flank pain. Plan for repeat CT scan early next week to follow-up on a perinephric abscess. EXAM: CT ABDOMEN AND PELVIS WITH CONTRAST TECHNIQUE: Multidetector CT imaging of the abdomen and pelvis was performed using the standard protocol following bolus administration of intravenous contrast. CONTRAST:  75mL OMNIPAQUE IOHEXOL 300 MG/ML SOLN, 25mL OMNIPAQUE IOHEXOL 240 MG/ML SOLN COMPARISON:  CT, 05/01/2019 FINDINGS: Lower chest: Small to moderate right and small left pleural effusions. There is associated dependent lung base opacity consistent with atelectasis.  There is also interstitial thickening with mild patchy areas of additional airspace lung opacity. Pleural effusions have enlarged since the recent prior study. Atelectasis has mildly increased. Hepatobiliary: No focal liver abnormality is seen. No gallstones, gallbladder wall thickening, or biliary dilatation. Pancreas: Unremarkable. No pancreatic ductal dilatation or surrounding inflammatory changes. Spleen: Normal in size without focal abnormality. Adrenals/Urinary Tract: No adrenal masses. Left perinephric fluid collection, contain by the renal capsule, measuring 4.2 x 1.1 cm transversely by 3.4 cm superior inferior, decreased from 4.4 x 1.5 x 3.7 cm previously. There is persistent left perinephric stranding, without significant change. Mild decrease in the [place] enhancement of the left kidney with delayed excretion when compared to the right, similar to the prior CT. No other renal abnormalities. No hydronephrosis. Ureters are normal in course and in caliber. No ureteral stones. Bladder is unremarkable. Stomach/Bowel: Mild distention of the colon, with less solid stool and more scattered air-fluid levels. No colonic wall thickening or inflammation. Stomach and small bowel are unremarkable. Vascular/Lymphatic: Mildly enlarged left retroperitoneal lymph node measuring 1.4 cm in short axis, unchanged. No other enlarged nodes. Aortic atherosclerosis.  No aneurysm. Reproductive: Uterus and bilateral adnexa are unremarkable. Other: No abdominal wall hernia or abnormality. No abdominopelvic ascites. Musculoskeletal: No fracture or acute finding. No osteoblastic or osteolytic lesions. IMPRESSION: 1. Mild decrease in the size of the left subcapsular renal abscess. 2. No new renal abnormalities.  No obstructive uropathy. 3. Mildly enlarged left retroperitoneal lymph node, presumed reactive, stable from the prior CT. 4. Decreased colonic stool when compared to the prior CT following enema treatment. 5. Increased size of  the lung base pleural effusions, small to moderate right and  small left, with mild increase in dependent atelectasis. Mild interstitial thickening at the bases is similar to the prior CT. 6. Aortic atherosclerosis. Electronically Signed   By: Amie Portlandavid  Ormond M.D.   On: 05/05/2019 14:20   I personally reviewed the above imaging and agree with the radiologic findings.  Assessment & Plan: Patient has improved clinically since admission, however with minimal resolution of the perinephric abscess on broad-spectrum antibiotics.  Would like to proceed with percutaneous aspiration with culture of contents with IR at this time.  Patient is amenable to this plan, however she states that she will require general anesthesia for this.  Assuming patient remained stable, would be amenable to discharge in the coming days on oral antibiotics pending culture results.  Thank you for involving me in the care of this patient, I will continue to follow along.  Carman ChingSamantha Faris Coolman, PA-C 05/06/2019

## 2019-05-06 NOTE — TOC Transition Note (Signed)
Transition of Care [facility]) - CM/SW Discharge Note   Patient Details  Name: Emily Collins MRN: [id number] Date of Birth: [birth year]  Transition of Care [facility] CM/SW Contact:  Katrina Stack, RN Phone Number: 05/06/2019, 1:30 PM   Clinical Narrative:   Patient for discharge today.  [facility] and in good standing.  Discharge script for Augmentin faxed to agency.  Patient verbalizes understanding of agency hours and to pick up the medication           Patient Goals and CMS Choice        Discharge Placement                       Discharge Plan and Services                                     Social Determinants of Health (SDOH) Interventions     Readmission Risk Interventions No flowsheet data found.

## 2019-05-12 ENCOUNTER — Telehealth: Payer: Self-pay | Admitting: Urology

## 2019-05-12 DIAGNOSIS — N151 Renal and perinephric abscess: Secondary | ICD-10-CM

## 2019-05-12 NOTE — Telephone Encounter (Signed)
I need an order for a CT scan for this patient please sir   Thanks, Sharyn Lull

## 2019-05-13 ENCOUNTER — Inpatient Hospital Stay: Payer: Self-pay | Attending: Oncology | Admitting: Oncology

## 2019-05-27 ENCOUNTER — Ambulatory Visit: Payer: Self-pay | Admitting: Urology

## 2019-05-27 ENCOUNTER — Other Ambulatory Visit: Payer: Self-pay | Admitting: Urology

## 2019-05-27 DIAGNOSIS — N151 Renal and perinephric abscess: Secondary | ICD-10-CM

## 2019-06-10 ENCOUNTER — Ambulatory Visit
Admission: RE | Admit: 2019-06-10 | Discharge: 2019-06-10 | Disposition: A | Discharge status: Home / Self Care | Payer: Medicaid Other | Source: Ambulatory Visit | Attending: Urology | Admitting: Urology

## 2019-06-10 ENCOUNTER — Other Ambulatory Visit: Payer: Self-pay

## 2019-06-10 DIAGNOSIS — N151 Renal and perinephric abscess: Secondary | ICD-10-CM | POA: Diagnosis not present

## 2019-06-10 LAB — POCT I-STAT CREATININE: Creatinine, Ser: 1.2 mg/dL — ABNORMAL HIGH (ref 0.44–1.00)

## 2019-06-10 MED ORDER — IOHEXOL 300 MG/ML  SOLN
85.0000 mL | Freq: Once | INTRAMUSCULAR | Status: AC | PRN
  Administered 2019-06-10: 85 mL via INTRAVENOUS

## 2019-06-11 ENCOUNTER — Telehealth: Payer: Self-pay | Admitting: Urology

## 2019-06-11 NOTE — Telephone Encounter (Signed)
Please notify patient that her follow-up CT was normal.  The previously noted fluid collection has completely resolved.  No further follow-up imaging needed.  She may follow-up prn.

## 2019-06-11 NOTE — Telephone Encounter (Signed)
Patient informed verbalized understanding

## 2019-08-14 ENCOUNTER — Other Ambulatory Visit: Payer: Self-pay

## 2019-08-27 ENCOUNTER — Other Ambulatory Visit: Payer: Self-pay

## 2019-08-27 ENCOUNTER — Ambulatory Visit: Payer: Medicaid Other | Admitting: Pharmacist

## 2019-08-27 DIAGNOSIS — Z79899 Other long term (current) drug therapy: Secondary | ICD-10-CM

## 2019-08-27 NOTE — Progress Notes (Signed)
[facility] Visit Note  Patient: Emily Collins MRN: [id number] Date of Birth: [birth year] PCP: Letta Median, MD   [facility] General 53 y.o. female presents for a follow-up MTM visit today.  There were no vitals taken for this visit.  Patient Information   Past Medical History:  Diagnosis Date  . Anxiety   . Arthritis   . Depression   . Diabetes mellitus without complication ([place])   . GERD (gastroesophageal reflux disease)   . History of chicken pox 04/07/2015   DID have Chicken Pox.    . Hyperlipidemia   . Hypertension   . Venous (peripheral) insufficiency       Past Surgical History:  Procedure Laterality Date  . APPENDECTOMY  1979  . BILATERAL CARPAL TUNNEL RELEASE Bilateral    Dr. Rudene Christians  . CESAREAN SECTION  2003  . LAPAROSCOPIC LYSIS OF ADHESIONS    . SHOULDER SURGERY Right 08/2009   Dr. Rudene Christians  . TUBAL LIGATION       Family History  Problem Relation Age of Onset  . Hypertension Mother   . Diabetes Mother   . Hyperlipidemia Mother   . Arthritis Mother   . Heart disease Father   . Kidney disease Father     New Diagnoses (since last visit):   Family Support: Good  Lifestyle Diet: Patient eats what she wants to eat.                 Social History   Substance and Sexual Activity  Alcohol Use Yes  . Alcohol/week: 1.0 - 2.0 standard drinks  . Types: 1 - 2 Glasses of wine per week   Comment: Socially      Social History   Tobacco Use  Smoking Status Former Smoker  . Packs/day: 1.00  . Years: 30.00  . Pack years: 30.00  . Types: Cigarettes  . Start date: 04/06/1985  . Quit date: 09/25/2017  . Years since quitting: 1.9  Smokeless Tobacco Never Used      Health Maintenance  Topic Date Due  . FOOT EXAM  06/14/1975  . OPHTHALMOLOGY EXAM  06/14/1975  . HIV Screening  06/13/1980  . PAP SMEAR-Modifier  01/11/2013  . MAMMOGRAM  06/14/2015  . COLONOSCOPY  06/14/2015  . HEMOGLOBIN A1C  02/27/2017  . URINE  MICROALBUMIN  04/05/2017  . INFLUENZA VACCINE  04/26/2019  . TETANUS/TDAP  08/06/2021  . PNEUMOCOCCAL POLYSACCHARIDE VACCINE AGE 61-64 HIGH RISK  Completed     Assessment and Plan:  Health Maintenance/Date Completed  Last ED visit: 04/26/2019.  11 days in the hospital due infectious renal cyst Last Visit to PCP: 2 weeks after ED discharge.  Follows with Rutherford Guys Next Visit to PCP: She visits every 3 months.  Currently speaks over Zoom Specialist Visit: No, uninsured  Dental Exam: Over a year, not since coming off Medicaid Eye Exam: Over a year Prostate Exam: NA Pelvic/PAP Exam: Years ago Mammogram: 2 years DEXA: No Colonoscopy: No Flu Vaccine: Planning on getting it Pneumonia Vaccine: Unsure  Adherence:  Some gaps observed in refill history.  Patient uses pill box at home.  She organizes medications every week.  Patient reports not missing doses, except occasionally missing PM Lantus injection.  We discussed strategies to help her remember, such as taking with her other PM medicines.  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  T1DM with neuropathy:  Last A1c ~14 in Aug 2020.  Humalog with  sliding scale, lantus 34 units AM, 34 units PM.  Patient reports checking BG at home TID.  She checks before breakfast (~BG 114 this AM), after lunch, and before bed after dinner.  Patient reports BG is not steady (39 - 350), and she has a difficult time controlling.  She struggles with diet, and eats whatever she wants.  She also struggles with snacking.  However, she is motivated to make changes to diet and sees a nutritionist at [employer].  Encouraged patient to try to find consistency in carbohydrate intake and snacking.  As far as exercise, patient walks every day, and walks to her mother's house every day.  Depression/Anxiety:  Fluoxetine 40 mg, hydroxyzine PRN for anxiety attacks.  Mood is good overall, and she feels as though the prozac helps.  No  thoughts of suicide or harm to others.  She does not follow up with anyone for depression/anxiety except PCP.    Furosemide 40 mg PRN, ASA 81 mg:  Patient does not have CHF, and denies history of acute MI.  She reports taking ASA 81 mg for primary prevention as she is over 29 years old, and she believes furosemide was prescribed for edema secondary to her neuropathy.    HTN:  Lisinopril 5 mg.  Denies any feelings of lightheadedness or dizziness upon standing. She checks at home and her BP is 120s/68.  She used to be prone to syncope when she was younger, but denies this occurrence now.      GERD:  Controlled on omeprazole 40 mg  HLD:  Atorvastatin 20 mg    Allergy:  Controlled on Cetirizine 10 mg   Arthritis (hands/joints, knees):  Currently takes meloxicam.  Patient takes every day.  Patient takes 2 aleve 200 mg on top of this.  Advised her to stop taking extra Aleve and Motrin with meloxicam due to duplications of therapy, side effects, and above all cardiovascular risk.  Recommended tylenol arthritis, as well as capscaicin cream.      Asthma:  Montelukast, albuterol.  She had pneumonia during recent hospital stay, and was prescribed atrovent, which she takes 2 puffs QID.  She feels atrovent is really helping.   Follow-up:  Follow-up in 6 months  Cherly Hensen, PharmD Pharmacy Resident  08/27/2019 12:31 PM

## 2019-09-01 ENCOUNTER — Other Ambulatory Visit: Payer: Self-pay

## 2019-10-06 ENCOUNTER — Telehealth: Payer: Self-pay | Admitting: Pharmacy Technician

## 2019-10-06 NOTE — Telephone Encounter (Signed)
Patient has Medicaid with prescription drug coverage.  Patient no longer meets the eligibility criteria for [facility].  Patient notified.  Kyia Rhude J. Brance Dartt Care Manager [facility] 

## 2020-03-22 ENCOUNTER — Other Ambulatory Visit: Payer: Medicaid Other

## 2020-05-05 ENCOUNTER — Other Ambulatory Visit: Payer: Self-pay | Admitting: Family Medicine

## 2020-06-09 DIAGNOSIS — E1042 Type 1 diabetes mellitus with diabetic polyneuropathy: Secondary | ICD-10-CM | POA: Insufficient documentation

## 2020-10-27 ENCOUNTER — Ambulatory Visit
Admission: RE | Admit: 2020-10-27 | Discharge: 2020-10-27 | Disposition: A | Discharge status: Home / Self Care | Payer: Medicaid Other | Source: Ambulatory Visit | Attending: Internal Medicine | Admitting: Internal Medicine

## 2020-10-27 ENCOUNTER — Other Ambulatory Visit: Payer: Self-pay | Admitting: Internal Medicine

## 2020-10-27 ENCOUNTER — Other Ambulatory Visit: Payer: Self-pay

## 2020-10-27 DIAGNOSIS — M79662 Pain in left lower leg: Secondary | ICD-10-CM

## 2020-10-27 DIAGNOSIS — M7989 Other specified soft tissue disorders: Secondary | ICD-10-CM

## 2020-10-28 ENCOUNTER — Other Ambulatory Visit: Payer: Self-pay | Admitting: Internal Medicine

## 2020-10-28 DIAGNOSIS — M79662 Pain in left lower leg: Secondary | ICD-10-CM

## 2020-10-28 DIAGNOSIS — IMO0002 Reserved for concepts with insufficient information to code with codable children: Secondary | ICD-10-CM

## 2020-10-28 DIAGNOSIS — R590 Localized enlarged lymph nodes: Secondary | ICD-10-CM

## 2020-10-28 DIAGNOSIS — E1065 Type 1 diabetes mellitus with hyperglycemia: Secondary | ICD-10-CM

## 2020-11-03 ENCOUNTER — Other Ambulatory Visit: Payer: Self-pay

## 2020-11-03 ENCOUNTER — Ambulatory Visit
Admission: RE | Admit: 2020-11-03 | Discharge: 2020-11-03 | Disposition: A | Discharge status: Home / Self Care | Payer: Medicaid Other | Source: Ambulatory Visit | Attending: Internal Medicine | Admitting: Internal Medicine

## 2020-11-03 DIAGNOSIS — M7989 Other specified soft tissue disorders: Secondary | ICD-10-CM | POA: Insufficient documentation

## 2020-11-03 DIAGNOSIS — R590 Localized enlarged lymph nodes: Secondary | ICD-10-CM | POA: Insufficient documentation

## 2020-11-03 DIAGNOSIS — M79662 Pain in left lower leg: Secondary | ICD-10-CM

## 2020-11-03 DIAGNOSIS — E1065 Type 1 diabetes mellitus with hyperglycemia: Secondary | ICD-10-CM | POA: Insufficient documentation

## 2020-11-03 DIAGNOSIS — IMO0002 Reserved for concepts with insufficient information to code with codable children: Secondary | ICD-10-CM

## 2020-11-03 HISTORY — DX: Unspecified asthma, uncomplicated: J45.909

## 2020-11-03 LAB — POCT I-STAT CREATININE: Creatinine, Ser: 1.7 mg/dL — ABNORMAL HIGH (ref 0.44–1.00)

## 2020-11-03 MED ORDER — IOHEXOL 300 MG/ML  SOLN: 100.0000 mL | Freq: Once | INTRAMUSCULAR | Status: DC | PRN

## 2020-11-03 MED ORDER — IOHEXOL 300 MG/ML  SOLN
75.0000 mL | Freq: Once | INTRAMUSCULAR | Status: AC | PRN
  Administered 2020-11-03: 75 mL via INTRAVENOUS

## 2020-11-13 ENCOUNTER — Encounter: Payer: Self-pay | Admitting: Emergency Medicine

## 2020-11-13 ENCOUNTER — Emergency Department
Admission: EM | Admit: 2020-11-13 | Discharge: 2020-11-13 | Disposition: A | Discharge status: Home / Self Care | Payer: Medicaid Other | Attending: Emergency Medicine | Admitting: Emergency Medicine

## 2020-11-13 ENCOUNTER — Emergency Department: Payer: Medicaid Other

## 2020-11-13 ENCOUNTER — Other Ambulatory Visit: Payer: Self-pay

## 2020-11-13 DIAGNOSIS — E1122 Type 2 diabetes mellitus with diabetic chronic kidney disease: Secondary | ICD-10-CM | POA: Insufficient documentation

## 2020-11-13 DIAGNOSIS — Z79899 Other long term (current) drug therapy: Secondary | ICD-10-CM | POA: Diagnosis not present

## 2020-11-13 DIAGNOSIS — M542 Cervicalgia: Secondary | ICD-10-CM | POA: Diagnosis present

## 2020-11-13 DIAGNOSIS — M436 Torticollis: Secondary | ICD-10-CM

## 2020-11-13 DIAGNOSIS — E11319 Type 2 diabetes mellitus with unspecified diabetic retinopathy without macular edema: Secondary | ICD-10-CM | POA: Insufficient documentation

## 2020-11-13 DIAGNOSIS — N189 Chronic kidney disease, unspecified: Secondary | ICD-10-CM | POA: Diagnosis not present

## 2020-11-13 DIAGNOSIS — M19011 Primary osteoarthritis, right shoulder: Secondary | ICD-10-CM | POA: Insufficient documentation

## 2020-11-13 DIAGNOSIS — M50122 Cervical disc disorder at C5-C6 level with radiculopathy: Secondary | ICD-10-CM | POA: Diagnosis not present

## 2020-11-13 DIAGNOSIS — M50123 Cervical disc disorder at C6-C7 level with radiculopathy: Secondary | ICD-10-CM | POA: Insufficient documentation

## 2020-11-13 DIAGNOSIS — Z87891 Personal history of nicotine dependence: Secondary | ICD-10-CM | POA: Diagnosis not present

## 2020-11-13 DIAGNOSIS — J45909 Unspecified asthma, uncomplicated: Secondary | ICD-10-CM | POA: Diagnosis not present

## 2020-11-13 DIAGNOSIS — M5030 Other cervical disc degeneration, unspecified cervical region: Secondary | ICD-10-CM

## 2020-11-13 DIAGNOSIS — Z7982 Long term (current) use of aspirin: Secondary | ICD-10-CM | POA: Diagnosis not present

## 2020-11-13 DIAGNOSIS — M5412 Radiculopathy, cervical region: Secondary | ICD-10-CM

## 2020-11-13 DIAGNOSIS — I129 Hypertensive chronic kidney disease with stage 1 through stage 4 chronic kidney disease, or unspecified chronic kidney disease: Secondary | ICD-10-CM | POA: Insufficient documentation

## 2020-11-13 DIAGNOSIS — Z794 Long term (current) use of insulin: Secondary | ICD-10-CM | POA: Diagnosis not present

## 2020-11-13 DIAGNOSIS — Z7951 Long term (current) use of inhaled steroids: Secondary | ICD-10-CM | POA: Diagnosis not present

## 2020-11-13 MED ORDER — HYDROCODONE-ACETAMINOPHEN 5-325 MG PO TABS: 1.0000 | ORAL_TABLET | Freq: Four times a day (QID) | ORAL | 0 refills | Status: DC | PRN

## 2020-11-13 MED ORDER — METHOCARBAMOL 500 MG PO TABS: ORAL_TABLET | ORAL | 0 refills | Status: DC

## 2020-11-13 MED ORDER — METHOCARBAMOL 500 MG PO TABS
1000.0000 mg | ORAL_TABLET | Freq: Once | ORAL | Status: AC
  Administered 2020-11-13: 1000 mg via ORAL
  Filled 2020-11-13: qty 2

## 2020-11-13 MED ORDER — HYDROCODONE-ACETAMINOPHEN 5-325 MG PO TABS
1.0000 | ORAL_TABLET | Freq: Once | ORAL | Status: AC
  Administered 2020-11-13: 1 via ORAL
  Filled 2020-11-13: qty 1

## 2020-11-13 NOTE — ED Notes (Signed)
See triage note  Presents with right posterior shoulder pain  States she woke up with pain from her neck and moving into shoulder 3 days ago

## 2020-11-13 NOTE — ED Triage Notes (Signed)
Pt to ED via POV stating that she has been having pain in her right arm and shoulder x 3 days. Pt reports that she woke up with the pain 3 days ago and the pain has continued. Pt denies any known injury to the area. Pt has used tylenol and ibuprofen without relief. Pt is in NAD.

## 2020-11-13 NOTE — ED Provider Notes (Signed)
[facility] Emergency Department Provider Note  ____________________________________________   Event Date/Time   First MD Initiated Contact with Patient 11/13/20 [phone]     (approximate)  I have reviewed the triage vital signs and the nursing notes.   HISTORY  Chief Complaint Arm Pain and Shoulder Pain    HPI Emily Collins is a 55 y.o. female presents to the ED with complaint of neck and right shoulder pain that began when she woke 3 days ago.  Patient states that there has been no history of injury.  Patient denies any improvement with medication over-the-counter.  Patient has used both ibuprofen and Tylenol without any relief.  She describes it as a "crick in my neck".  Currently she rates her pain as an 8 out of 10.     Past Medical History:  Diagnosis Date  . Allergy   . Anxiety   . Arthritis   . Asthma   . Depression   . Diabetes mellitus without complication (HCC)   . GERD (gastroesophageal reflux disease)   . History of chicken pox 04/07/2015   DID have Chicken Pox.    . Hyperlipidemia   . Hypertension   . Neuromuscular disorder (H[facility])   . Venous (peripheral) insufficiency     Patient Active Problem List   Diagnosis Date Noted  . Sepsis (HCC) 04/26/2019  . Arthritis 05/10/2016  . Chronic kidney disease 04/05/2016  . Allergic rhinitis 04/07/2015  . Depression 04/07/2015  . Diabetes mellitus type 1.5,uncontrolled, managed as type 2 04/07/2015  . Diabetic retinopathy (HCC) 04/07/2015  . Dysesthesia 04/07/2015  . Family history of early CAD 04/07/2015  . GERD (gastroesophageal reflux disease) 04/07/2015  . Hyperlipidemia 04/07/2015  . Insomnia 04/07/2015  . Tobacco abuse 04/07/2015  . Venous insufficiency 04/07/2015  . Anxiety 04/07/2015    Past Surgical History:  Procedure Laterality Date  . APPENDECTOMY  1979  . BILATERAL CARPAL TUNNEL RELEASE Bilateral    Dr. Rosita KeaMenz  . CESAREAN SECTION  2003  . LAPAROSCOPIC LYSIS OF  ADHESIONS    . SHOULDER SURGERY Right 08/2009   Dr. Rosita KeaMenz  . TUBAL LIGATION      Prior to Admission medications   Medication Sig Start Date End Date Taking? Authorizing Provider  HYDROcodone-acetaminophen (NORCO/VICODIN) 5-325 MG tablet Take 1 tablet by mouth every 6 (six) hours as needed for moderate pain. 11/13/20  Yes Tommi RumpsSummers, Lillyahna Hemberger L, PA-C  methocarbamol (ROBAXIN) 500 MG tablet 1-2 tablets every 6 hours prn muscle spasms 11/13/20  Yes Tommi RumpsSummers, Badr Piedra L, PA-C  aspirin 81 MG tablet Take 81 mg by mouth daily.    [provider]  cetirizine (ZYRTEC) 10 MG tablet TAKE 1 TABLET(10 MG) BY MOUTH DAILY Patient taking differently: Take 10 mg by mouth daily.  05/16/17   Malva LimesFisher, Donald E, MD  ferrous sulfate 325 (65 FE) MG EC tablet Take 1 tablet (325 mg total) by mouth 2 (two) times daily. 05/06/19 08/26/20  Houston SirenSainani, Vivek J, MD  FLUoxetine (PROZAC) 40 MG capsule TAKE 1 CAPSULE BY MOUTH EVERY DAY Patient taking differently: Take 40 mg by mouth daily. TAKE 1 CAPSULE BY MOUTH EVERY DAY 02/12/17   Malva LimesFisher, Donald E, MD  fluticasone [facility](FLONASE) 50 MCG/ACT nasal spray SHAKE WELL AND USE 2 SPRAYS IN EACH NOSTRIL DAILY 04/02/17   Malva LimesFisher, Donald E, MD  furosemide (LASIX) 40 MG tablet TAKE 1 TABLET BY MOUTH AS NEEDED FOR SWELLING Patient taking differently: Take 40 mg by mouth daily. TAKE 1 TABLET BY MOUTH AS  NEEDED FOR SWELLING 05/26/18   Malva Limes, MD  gabapentin (NEURONTIN) 600 MG tablet Take 600 mg by mouth at bedtime.    Bender, [name], MD  HUMALOG KWIKPEN 100 UNIT/ML KiwkPen 5 Units 3 (three) times daily with meals. INJ UP TO 100 UNI [place] PER DAY AS PER MD INSTRUCTIONS 10/02/16   [provider]  hydrOXYzine (ATARAX/VISTARIL) 10 MG tablet Take 10 mg by mouth 3 (three) times daily as needed.    Bender, [name], MD  LANTUS SOLOSTAR 100 UNIT/ML Solostar Pen INJECT 40 UNITS UNDER THE SKIN TWICE DAILY Patient taking differently: Patient reports 34 units in the AM and 34 units in the PM  01/31/17   Fisher, Demetrios Isaacs, MD  lisinopril (ZESTRIL) 5 MG tablet Take 5 mg by mouth daily.    [provider]  meloxicam (MOBIC) 15 MG tablet TAKE 1 TABLET(15 MG) BY MOUTH DAILY WITH FOOD AS NEEDED FOR ARTHRITIS PAIN 06/15/17   Malva Limes, MD  montelukast (SINGULAIR) 10 MG tablet TAKE 1 TABLET BY MOUTH AT BEDTIME Patient taking differently: Take 10 mg by mouth at bedtime.  05/16/17   Malva Limes, MD  PROAIR HFA 108 [phone] Base) MCG/ACT inhaler INHALE 2 PUFFS INTO THE LUNGS EVERY 6 HOURS AS NEEDED FOR WHEEZING OR SHORTNESS OF BREATH 02/12/17   Malva Limes, MD  rosuvastatin (CRESTOR) 5 MG tablet TAKE 1 TABLET(5 MG) BY MOUTH DAILY Patient not taking: Reported on 08/27/2019 01/24/17   Malva Limes, MD    Allergies Atorvastatin, Levaquin  [levofloxacin in d5w], Simvastatin, Dilaudid  [hydromorphone hcl], and Tetracycline  Family History  Problem Relation Age of Onset  . Hypertension Mother   . Diabetes Mother   . Hyperlipidemia Mother   . Arthritis Mother   . Heart disease Father   . Kidney disease Father     Social History Social History   Tobacco Use  . Smoking status: Former Smoker    Packs/day: 1.00    Years: 30.00    Pack years: 30.00    Types: Cigarettes    Start date: 04/06/1985    Quit date: 09/25/2017    Years since quitting: 3.1  . Smokeless tobacco: Never Used  Vaping Use  . Vaping Use: Never used  Substance Use Topics  . Alcohol use: Yes    Alcohol/week: 1.0 - 2.0 standard drink    Types: 1 - 2 Glasses of wine per week    Comment: Socially  . Drug use: No    Review of Systems Constitutional: No fever/chills Eyes: No visual changes. ENT: No sore throat. Cardiovascular: Denies chest pain. Respiratory: Denies shortness of breath.  Negative for cough. Gastrointestinal: No abdominal pain.  No nausea, no vomiting.  Musculoskeletal: Positive for cervical pain and right shoulder pain. Skin: Negative for rash. Neurological: Negative for headaches,  focal weakness or numbness. ____________________________________________   PHYSICAL EXAM:  VITAL SIGNS: ED Triage Vitals  Enc Vitals Group     BP 11/13/20 0920 (!) 118/59     Pulse Rate 11/13/20 0920 90     Resp 11/13/20 0920 16     Temp 11/13/20 0920 98.3 F (36.8 C)     Temp Source 11/13/20 0920 Oral     SpO2 11/13/20 0920 97 %     Weight 11/13/20 0919 218 lb (98.9 kg)     Height 11/13/20 0919 5\' 3"  (1.6 m)     Head Circumference --      Peak Flow --  Pain Score 11/13/20 0919 9     Pain Loc --      Pain Edu? --      Excl. in GC? --     Constitutional: Alert and oriented. Well appearing and in no acute distress. Eyes: Conjunctivae are normal. PERRL. EOMI. Head: Atraumatic. Neck: No stridor.  No point tenderness on palpation of cervical spine posteriorly however there is tenderness to the right lateral cervical muscles and down into the trapezius area.  Range of motion is mildly restricted laterally.  No deformity is noted of the right shoulder and no crepitus with range of motion.  Patient is able to move her right shoulder in all planes without any restriction.  No tenderness is noted on palpation to the parameters of the scapula.  No soft tissue injury is present. Cardiovascular: Normal rate, regular rhythm. Grossly normal heart sounds.  Good peripheral circulation. Respiratory: Normal respiratory effort.  No retractions. Lungs CTAB. Musculoskeletal: Patient is able to move left upper extremity and bilateral lower extremities with any difficulty.  No tenderness is noted on palpation of the thoracic or lumbar spine. Neurologic:  Normal speech and language. No gross focal neurologic deficits are appreciated. No gait instability. Skin:  Skin is warm, dry and intact. No rash noted. Psychiatric: Mood and affect are normal. Speech and behavior are normal.  ____________________________________________   LABS (all labs ordered are listed, but only abnormal results are  displayed)  Labs Reviewed - No data to display  RADIOLOGY I, Tommi Rumps, personally viewed and evaluated these images (plain radiographs) as part of my medical decision making, as well as reviewing the written report by the radiologist.   Official radiology report(s): DG Cervical Spine 2-3 Views  Result Date: 11/13/2020 CLINICAL DATA:  Neck and posterior shoulder pain for 3 days, awoke with symptoms EXAM: None CERVICAL SPINE - 2-3 VIEW COMPARISON:  Slight motion artifacts degrade exam. FINDINGS: Reversal of cervical lordosis question muscle spasm. Disc space narrowing and endplate spur formation at C5-C6 and C6-C7. Vertebral body heights maintained. No fracture, subluxation, or bone destruction. Slight head tilt to LEFT. Lung apices clear. Prevertebral soft tissues normal thickness. IMPRESSION: Degenerative disc disease changes at C5-C6 and C6-C7. Question muscle spasm. Electronically Signed   By: Ulyses Southward M.D.   On: 11/13/2020 12:46   DG Shoulder Right  Result Date: 11/13/2020 CLINICAL DATA:  Posterior RIGHT shoulder pain for 3 days, awoke with pain in her neck moving into RIGHT shoulder 3 days ago, with history of arthritis, diabetes mellitus, GERD, asthma, former smoker, neuromuscular disorder, hyperlipidemia EXAM: RIGHT SHOULDER - 2+ VIEW COMPARISON:  None FINDINGS: Osseous demineralization. Surgical anchor at greater tuberosity, question rotator cuff repair. Mild degenerative changes of RIGHT AC joint. Visualized RIGHT ribs intact. No acute fracture, dislocation, or bone destruction. IMPRESSION: Mild degenerative changes RIGHT AC joint. Electronically Signed   By: Ulyses Southward M.D.   On: 11/13/2020 12:45    ____________________________________________   PROCEDURES  Procedure(s) performed (including Critical Care):  Procedures   ____________________________________________   INITIAL IMPRESSION / ASSESSMENT AND PLAN / ED COURSE  As part of my medical decision making, I  reviewed the following data within the electronic MEDICAL RECORD NUMBER Notes from prior ED visits and [place] Controlled Substance Database  55 year old female presents to the ED with complaint of cervical pain and right shoulder pain for the last 3 days.  Patient states she woke in this condition and there is been no injury.  She has taken both Tylenol  and ibuprofen without any relief.  Range of motion is restricted laterally secondary to discomfort but no deformity is noted and range of motion of the right shoulder is without crepitus or restriction.  Patient was given methocarbamol 1000 mg and hydrocodone-acetaminophen prior to x-rays.  X-rays were reassuring however patient was made aware that she does have some degenerative changes in her cervical spine and suspicion for muscle spasms.  Patient states that with the medication she is greatly improved.  We discussed using moist heat or ice to her neck and shoulder as needed for discomfort.  A prescription for the methocarbamol and hydrocodone was sent to her pharmacy.  She is aware that she should not drive or operate machinery while taking this medication.  She is to follow-up with her PCP if any continued problems.  ____________________________________________   FINAL CLINICAL IMPRESSION(S) / ED DIAGNOSES  Final diagnoses:  Torticollis, acute  Degenerative disc disease, cervical  Right cervical radiculopathy     ED Discharge Orders         Ordered    methocarbamol (ROBAXIN) 500 MG tablet        11/13/20 1310    HYDROcodone-acetaminophen (NORCO/VICODIN) 5-325 MG tablet  Every 6 hours PRN        11/13/20 1310          *Please note:  Emily Collins was evaluated in Emergency Department on 11/13/2020 for the symptoms described in the history of present illness. She was evaluated in the context of the global COVID-19 pandemic, which necessitated consideration that the patient might be at risk for infection with the SARS-CoV-2 virus that causes  COVID-19. Institutional protocols and algorithms that pertain to the evaluation of patients at risk for COVID-19 are in a state of rapid change based on information released by regulatory bodies including the CDC and federal and state organizations. These policies and algorithms were followed during the patient's care in the ED.  Some ED evaluations and interventions may be delayed as a result of limited staffing during and the pandemic.*   Note:  This document was prepared using Dragon voice recognition software and may include unintentional dictation errors.    Tommi Rumps, PA-C 11/13/20 1425    Jene Every, MD 11/13/20 1432

## 2020-11-13 NOTE — Discharge Instructions (Addendum)
Follow-up with your primary care provider if any continued problems.  Take medication only as directed and be aware that the muscle relaxant and pain medication may cause drowsiness and may increase your risk for falling.  You may use ice or heat to your neck as needed for discomfort.  You may also take ibuprofen with these 2 medications if additional pain medication is needed.

## 2020-11-23 ENCOUNTER — Other Ambulatory Visit: Payer: Self-pay

## 2020-11-23 ENCOUNTER — Emergency Department
Admission: EM | Admit: 2020-11-23 | Discharge: 2020-11-23 | Disposition: A | Discharge status: Home / Self Care | Payer: Medicaid Other | Attending: Emergency Medicine | Admitting: Emergency Medicine

## 2020-11-23 ENCOUNTER — Emergency Department: Payer: Medicaid Other

## 2020-11-23 DIAGNOSIS — M7989 Other specified soft tissue disorders: Secondary | ICD-10-CM | POA: Diagnosis not present

## 2020-11-23 DIAGNOSIS — Z5321 Procedure and treatment not carried out due to patient leaving prior to being seen by health care provider: Secondary | ICD-10-CM | POA: Insufficient documentation

## 2020-11-23 LAB — COMPREHENSIVE METABOLIC PANEL
ALT: 24 U/L (ref 0–44)
AST: 22 U/L (ref 15–41)
Albumin: 3.5 g/dL (ref 3.5–5.0)
Alkaline Phosphatase: 94 U/L (ref 38–126)
Anion gap: 9 (ref 5–15)
BUN: 22 mg/dL — ABNORMAL HIGH (ref 6–20)
CO2: 22 mmol/L (ref 22–32)
Calcium: 8.4 mg/dL — ABNORMAL LOW (ref 8.9–10.3)
Chloride: 104 mmol/L (ref 98–111)
Creatinine, Ser: 1.34 mg/dL — ABNORMAL HIGH (ref 0.44–1.00)
GFR, Estimated: 47 mL/min — ABNORMAL LOW (ref >60)
Glucose, Bld: 161 mg/dL — ABNORMAL HIGH (ref 70–99)
Potassium: 4.2 mmol/L (ref 3.5–5.1)
Sodium: 135 mmol/L (ref 135–145)
Total Bilirubin: 0.5 mg/dL (ref 0.3–1.2)
Total Protein: 7.8 g/dL (ref 6.5–8.1)

## 2020-11-23 LAB — LACTIC ACID, PLASMA: Lactic Acid, Venous: 1.3 mmol/L (ref 0.5–1.9)

## 2020-11-23 LAB — CBC
HCT: 29.5 % — ABNORMAL LOW (ref 36.0–46.0)
Hemoglobin: 9.3 g/dL — ABNORMAL LOW (ref 12.0–15.0)
MCH: 27.4 pg (ref 26.0–34.0)
MCHC: 31.5 g/dL (ref 30.0–36.0)
MCV: 87 fL (ref 80.0–100.0)
Platelets: 373 10*3/uL (ref 150–400)
RBC: 3.39 MIL/uL — ABNORMAL LOW (ref 3.87–5.11)
RDW: 15.2 % (ref 11.5–15.5)
WBC: 21 10*3/uL — ABNORMAL HIGH (ref 4.0–10.5)
nRBC: 0 % (ref 0.0–0.2)

## 2020-11-23 NOTE — ED Triage Notes (Addendum)
Pt with wound to right heel x 5 days, states is a diabetic and now area is infected. Redness spreading to right lower leg with swelling.

## 2020-11-25 ENCOUNTER — Telehealth: Payer: Self-pay | Admitting: Emergency Medicine

## 2020-11-25 NOTE — Telephone Encounter (Signed)
Called patient due to left emergency department before provider exam to inquire about condition and follow up plans. She says she has been examined by her doctor since then.

## 2020-11-29 ENCOUNTER — Inpatient Hospital Stay: Payer: Medicaid Other

## 2020-11-29 ENCOUNTER — Emergency Department: Payer: Medicaid Other

## 2020-11-29 ENCOUNTER — Inpatient Hospital Stay
Admission: EM | Admit: 2020-11-29 | Discharge: 2020-12-09 | DRG: 623 | Disposition: A | Discharge status: Home / Self Care | Payer: Medicaid Other | Attending: Internal Medicine | Admitting: Internal Medicine

## 2020-11-29 ENCOUNTER — Other Ambulatory Visit: Payer: Self-pay

## 2020-11-29 ENCOUNTER — Encounter: Payer: Self-pay | Admitting: Emergency Medicine

## 2020-11-29 DIAGNOSIS — Z20822 Contact with and (suspected) exposure to covid-19: Secondary | ICD-10-CM | POA: Diagnosis present

## 2020-11-29 DIAGNOSIS — N1831 Chronic kidney disease, stage 3a: Secondary | ICD-10-CM | POA: Diagnosis present

## 2020-11-29 DIAGNOSIS — E785 Hyperlipidemia, unspecified: Secondary | ICD-10-CM | POA: Diagnosis present

## 2020-11-29 DIAGNOSIS — F419 Anxiety disorder, unspecified: Secondary | ICD-10-CM | POA: Diagnosis present

## 2020-11-29 DIAGNOSIS — Z881 Allergy status to other antibiotic agents status: Secondary | ICD-10-CM

## 2020-11-29 DIAGNOSIS — D75839 Thrombocytosis, unspecified: Secondary | ICD-10-CM | POA: Diagnosis not present

## 2020-11-29 DIAGNOSIS — D72829 Elevated white blood cell count, unspecified: Secondary | ICD-10-CM

## 2020-11-29 DIAGNOSIS — E10649 Type 1 diabetes mellitus with hypoglycemia without coma: Secondary | ICD-10-CM

## 2020-11-29 DIAGNOSIS — Z794 Long term (current) use of insulin: Secondary | ICD-10-CM

## 2020-11-29 DIAGNOSIS — M79604 Pain in right leg: Secondary | ICD-10-CM

## 2020-11-29 DIAGNOSIS — E1022 Type 1 diabetes mellitus with diabetic chronic kidney disease: Secondary | ICD-10-CM | POA: Diagnosis present

## 2020-11-29 DIAGNOSIS — E10621 Type 1 diabetes mellitus with foot ulcer: Secondary | ICD-10-CM | POA: Diagnosis present

## 2020-11-29 DIAGNOSIS — N189 Chronic kidney disease, unspecified: Secondary | ICD-10-CM

## 2020-11-29 DIAGNOSIS — G4700 Insomnia, unspecified: Secondary | ICD-10-CM | POA: Diagnosis present

## 2020-11-29 DIAGNOSIS — L0390 Cellulitis, unspecified: Secondary | ICD-10-CM | POA: Diagnosis present

## 2020-11-29 DIAGNOSIS — Z841 Family history of disorders of kidney and ureter: Secondary | ICD-10-CM

## 2020-11-29 DIAGNOSIS — Z6841 Body Mass Index (BMI) 40.0 and over, adult: Secondary | ICD-10-CM

## 2020-11-29 DIAGNOSIS — D75838 Other thrombocytosis: Secondary | ICD-10-CM | POA: Diagnosis present

## 2020-11-29 DIAGNOSIS — L03115 Cellulitis of right lower limb: Secondary | ICD-10-CM | POA: Diagnosis present

## 2020-11-29 DIAGNOSIS — Z888 Allergy status to other drugs, medicaments and biological substances status: Secondary | ICD-10-CM

## 2020-11-29 DIAGNOSIS — D649 Anemia, unspecified: Secondary | ICD-10-CM | POA: Diagnosis not present

## 2020-11-29 DIAGNOSIS — E119 Type 2 diabetes mellitus without complications: Secondary | ICD-10-CM | POA: Diagnosis not present

## 2020-11-29 DIAGNOSIS — E11628 Type 2 diabetes mellitus with other skin complications: Secondary | ICD-10-CM | POA: Diagnosis not present

## 2020-11-29 DIAGNOSIS — E1165 Type 2 diabetes mellitus with hyperglycemia: Secondary | ICD-10-CM | POA: Diagnosis not present

## 2020-11-29 DIAGNOSIS — E1065 Type 1 diabetes mellitus with hyperglycemia: Secondary | ICD-10-CM | POA: Diagnosis present

## 2020-11-29 DIAGNOSIS — L97412 Non-pressure chronic ulcer of right heel and midfoot with fat layer exposed: Secondary | ICD-10-CM

## 2020-11-29 DIAGNOSIS — E10319 Type 1 diabetes mellitus with unspecified diabetic retinopathy without macular edema: Secondary | ICD-10-CM | POA: Diagnosis present

## 2020-11-29 DIAGNOSIS — I129 Hypertensive chronic kidney disease with stage 1 through stage 4 chronic kidney disease, or unspecified chronic kidney disease: Secondary | ICD-10-CM | POA: Diagnosis present

## 2020-11-29 DIAGNOSIS — E1042 Type 1 diabetes mellitus with diabetic polyneuropathy: Secondary | ICD-10-CM | POA: Diagnosis present

## 2020-11-29 DIAGNOSIS — D631 Anemia in chronic kidney disease: Secondary | ICD-10-CM | POA: Diagnosis present

## 2020-11-29 DIAGNOSIS — K219 Gastro-esophageal reflux disease without esophagitis: Secondary | ICD-10-CM | POA: Diagnosis present

## 2020-11-29 DIAGNOSIS — J309 Allergic rhinitis, unspecified: Secondary | ICD-10-CM | POA: Diagnosis present

## 2020-11-29 DIAGNOSIS — E10628 Type 1 diabetes mellitus with other skin complications: Secondary | ICD-10-CM | POA: Diagnosis present

## 2020-11-29 DIAGNOSIS — D638 Anemia in other chronic diseases classified elsewhere: Secondary | ICD-10-CM

## 2020-11-29 DIAGNOSIS — I872 Venous insufficiency (chronic) (peripheral): Secondary | ICD-10-CM | POA: Diagnosis present

## 2020-11-29 DIAGNOSIS — L03119 Cellulitis of unspecified part of limb: Secondary | ICD-10-CM | POA: Diagnosis not present

## 2020-11-29 DIAGNOSIS — E871 Hypo-osmolality and hyponatremia: Secondary | ICD-10-CM | POA: Diagnosis present

## 2020-11-29 DIAGNOSIS — N179 Acute kidney failure, unspecified: Secondary | ICD-10-CM

## 2020-11-29 DIAGNOSIS — L02415 Cutaneous abscess of right lower limb: Secondary | ICD-10-CM | POA: Diagnosis present

## 2020-11-29 DIAGNOSIS — F32A Depression, unspecified: Secondary | ICD-10-CM | POA: Diagnosis present

## 2020-11-29 DIAGNOSIS — Z833 Family history of diabetes mellitus: Secondary | ICD-10-CM

## 2020-11-29 DIAGNOSIS — Z79899 Other long term (current) drug therapy: Secondary | ICD-10-CM

## 2020-11-29 DIAGNOSIS — Z885 Allergy status to narcotic agent status: Secondary | ICD-10-CM

## 2020-11-29 DIAGNOSIS — L02619 Cutaneous abscess of unspecified foot: Secondary | ICD-10-CM

## 2020-11-29 DIAGNOSIS — Z7982 Long term (current) use of aspirin: Secondary | ICD-10-CM

## 2020-11-29 DIAGNOSIS — E1051 Type 1 diabetes mellitus with diabetic peripheral angiopathy without gangrene: Secondary | ICD-10-CM | POA: Diagnosis present

## 2020-11-29 DIAGNOSIS — Z8261 Family history of arthritis: Secondary | ICD-10-CM

## 2020-11-29 DIAGNOSIS — E875 Hyperkalemia: Secondary | ICD-10-CM

## 2020-11-29 DIAGNOSIS — S91312A Laceration without foreign body, left foot, initial encounter: Secondary | ICD-10-CM | POA: Diagnosis present

## 2020-11-29 DIAGNOSIS — Z87891 Personal history of nicotine dependence: Secondary | ICD-10-CM

## 2020-11-29 DIAGNOSIS — L089 Local infection of the skin and subcutaneous tissue, unspecified: Secondary | ICD-10-CM | POA: Diagnosis not present

## 2020-11-29 LAB — COMPREHENSIVE METABOLIC PANEL
ALT: 24 U/L (ref 0–44)
AST: 28 U/L (ref 15–41)
Albumin: 3.1 g/dL — ABNORMAL LOW (ref 3.5–5.0)
Alkaline Phosphatase: 117 U/L (ref 38–126)
Anion gap: 8 (ref 5–15)
BUN: 23 mg/dL — ABNORMAL HIGH (ref 6–20)
CO2: 21 mmol/L — ABNORMAL LOW (ref 22–32)
Calcium: 8.5 mg/dL — ABNORMAL LOW (ref 8.9–10.3)
Chloride: 104 mmol/L (ref 98–111)
Creatinine, Ser: 1.4 mg/dL — ABNORMAL HIGH (ref 0.44–1.00)
GFR, Estimated: 44 mL/min — ABNORMAL LOW (ref >60)
Glucose, Bld: 168 mg/dL — ABNORMAL HIGH (ref 70–99)
Potassium: 5.5 mmol/L — ABNORMAL HIGH (ref 3.5–5.1)
Sodium: 133 mmol/L — ABNORMAL LOW (ref 135–145)
Total Bilirubin: 0.4 mg/dL (ref 0.3–1.2)
Total Protein: 8.1 g/dL (ref 6.5–8.1)

## 2020-11-29 LAB — CBC WITH DIFFERENTIAL/PLATELET
Abs Immature Granulocytes: 0.12 10*3/uL — ABNORMAL HIGH (ref 0.00–0.07)
Basophils Absolute: 0.1 10*3/uL (ref 0.0–0.1)
Basophils Relative: 1 %
Eosinophils Absolute: 0.7 10*3/uL — ABNORMAL HIGH (ref 0.0–0.5)
Eosinophils Relative: 5 %
HCT: 27 % — ABNORMAL LOW (ref 36.0–46.0)
Hemoglobin: 8.6 g/dL — ABNORMAL LOW (ref 12.0–15.0)
Immature Granulocytes: 1 %
Lymphocytes Relative: 12 %
Lymphs Abs: 1.9 10*3/uL (ref 0.7–4.0)
MCH: 27.1 pg (ref 26.0–34.0)
MCHC: 31.9 g/dL (ref 30.0–36.0)
MCV: 85.2 fL (ref 80.0–100.0)
Monocytes Absolute: 0.7 10*3/uL (ref 0.1–1.0)
Monocytes Relative: 5 %
Neutro Abs: 12 10*3/uL — ABNORMAL HIGH (ref 1.7–7.7)
Neutrophils Relative %: 76 %
Platelets: 520 10*3/uL — ABNORMAL HIGH (ref 150–400)
RBC: 3.17 MIL/uL — ABNORMAL LOW (ref 3.87–5.11)
RDW: 15.9 % — ABNORMAL HIGH (ref 11.5–15.5)
WBC: 15.6 10*3/uL — ABNORMAL HIGH (ref 4.0–10.5)
nRBC: 0 % (ref 0.0–0.2)

## 2020-11-29 LAB — CBG MONITORING, ED
Glucose-Capillary: 47 mg/dL — ABNORMAL LOW (ref 70–99)
Glucose-Capillary: 148 mg/dL — ABNORMAL HIGH (ref 70–99)
Glucose-Capillary: 103 mg/dL — ABNORMAL HIGH (ref 70–99)
Glucose-Capillary: 122 mg/dL — ABNORMAL HIGH (ref 70–99)
Glucose-Capillary: 84 mg/dL (ref 70–99)

## 2020-11-29 LAB — LACTIC ACID, PLASMA: Lactic Acid, Venous: 1.2 mmol/L (ref 0.5–1.9)

## 2020-11-29 LAB — BASIC METABOLIC PANEL
Anion gap: 8 (ref 5–15)
BUN: 26 mg/dL — ABNORMAL HIGH (ref 6–20)
CO2: 21 mmol/L — ABNORMAL LOW (ref 22–32)
Calcium: 8.3 mg/dL — ABNORMAL LOW (ref 8.9–10.3)
Chloride: 105 mmol/L (ref 98–111)
Creatinine, Ser: 1.65 mg/dL — ABNORMAL HIGH (ref 0.44–1.00)
GFR, Estimated: 36 mL/min — ABNORMAL LOW (ref >60)
Glucose, Bld: 94 mg/dL (ref 70–99)
Potassium: 5 mmol/L (ref 3.5–5.1)
Sodium: 134 mmol/L — ABNORMAL LOW (ref 135–145)

## 2020-11-29 LAB — CBC
HCT: 27.5 % — ABNORMAL LOW (ref 36.0–46.0)
Hemoglobin: 8.5 g/dL — ABNORMAL LOW (ref 12.0–15.0)
MCH: 27 pg (ref 26.0–34.0)
MCHC: 30.9 g/dL (ref 30.0–36.0)
MCV: 87.3 fL (ref 80.0–100.0)
Platelets: 548 10*3/uL — ABNORMAL HIGH (ref 150–400)
RBC: 3.15 MIL/uL — ABNORMAL LOW (ref 3.87–5.11)
RDW: 16.1 % — ABNORMAL HIGH (ref 11.5–15.5)
WBC: 16.1 10*3/uL — ABNORMAL HIGH (ref 4.0–10.5)
nRBC: 0 % (ref 0.0–0.2)

## 2020-11-29 LAB — MAGNESIUM: Magnesium: 2.6 mg/dL — ABNORMAL HIGH (ref 1.7–2.4)

## 2020-11-29 LAB — TSH: TSH: 4.501 u[IU]/mL — ABNORMAL HIGH (ref 0.350–4.500)

## 2020-11-29 LAB — PHOSPHORUS: Phosphorus: 4.4 mg/dL (ref 2.5–4.6)

## 2020-11-29 MED ORDER — INSULIN DETEMIR 100 UNIT/ML ~~LOC~~ SOLN
40.0000 [IU] | Freq: Every day | SUBCUTANEOUS | Status: DC
  Filled 2020-11-29: qty 0.4

## 2020-11-29 MED ORDER — ONDANSETRON HCL 4 MG PO TABS: 4.0000 mg | ORAL_TABLET | Freq: Four times a day (QID) | ORAL | Status: DC | PRN

## 2020-11-29 MED ORDER — ONDANSETRON HCL 4 MG/2ML IJ SOLN
4.0000 mg | Freq: Four times a day (QID) | INTRAMUSCULAR | Status: DC | PRN
  Filled 2020-11-29: qty 2

## 2020-11-29 MED ORDER — LORATADINE 10 MG PO TABS
10.0000 mg | ORAL_TABLET | Freq: Every day | ORAL | Status: DC
  Administered 2020-11-30 – 2020-12-09 (×10): 10 mg via ORAL
  Filled 2020-11-29 (×10): qty 1

## 2020-11-29 MED ORDER — IPRATROPIUM BROMIDE HFA 17 MCG/ACT IN AERS
2.0000 | INHALATION_SPRAY | RESPIRATORY_TRACT | Status: DC
  Administered 2020-11-30 – 2020-12-09 (×46): 2 via RESPIRATORY_TRACT
  Filled 2020-11-29 (×3): qty 12.9

## 2020-11-29 MED ORDER — ASPIRIN EC 81 MG PO TBEC
81.0000 mg | DELAYED_RELEASE_TABLET | Freq: Every day | ORAL | Status: DC
  Administered 2020-11-30 – 2020-12-09 (×9): 81 mg via ORAL
  Filled 2020-11-29 (×10): qty 1

## 2020-11-29 MED ORDER — ENOXAPARIN SODIUM 60 MG/0.6ML ~~LOC~~ SOLN
0.5000 mg/kg | SUBCUTANEOUS | Status: DC
  Administered 2020-11-30 – 2020-12-08 (×10): 52.5 mg via SUBCUTANEOUS
  Filled 2020-11-29 (×10): qty 0.6

## 2020-11-29 MED ORDER — INSULIN ASPART 100 UNIT/ML ~~LOC~~ SOLN
0.0000 [IU] | SUBCUTANEOUS | Status: DC
  Administered 2020-11-30: 6 [IU] via SUBCUTANEOUS
  Administered 2020-11-30: 3 [IU] via SUBCUTANEOUS
  Filled 2020-11-29 (×2): qty 1

## 2020-11-29 MED ORDER — KETOROLAC TROMETHAMINE 30 MG/ML IJ SOLN
15.0000 mg | Freq: Once | INTRAMUSCULAR | Status: AC
  Administered 2020-11-29: 15 mg via INTRAVENOUS
  Filled 2020-11-29: qty 1

## 2020-11-29 MED ORDER — KETOROLAC TROMETHAMINE 15 MG/ML IJ SOLN
15.0000 mg | Freq: Four times a day (QID) | INTRAMUSCULAR | Status: AC | PRN
  Administered 2020-11-30 (×4): 15 mg via INTRAVENOUS
  Filled 2020-11-29 (×5): qty 1

## 2020-11-29 MED ORDER — PANTOPRAZOLE SODIUM 40 MG PO TBEC
40.0000 mg | DELAYED_RELEASE_TABLET | Freq: Every day | ORAL | Status: DC
  Administered 2020-11-30 – 2020-12-09 (×10): 40 mg via ORAL
  Filled 2020-11-29 (×10): qty 1

## 2020-11-29 MED ORDER — AMITRIPTYLINE HCL 25 MG PO TABS
25.0000 mg | ORAL_TABLET | Freq: Every day | ORAL | Status: DC
  Administered 2020-11-30 – 2020-12-08 (×10): 25 mg via ORAL
  Filled 2020-11-29 (×10): qty 1

## 2020-11-29 MED ORDER — ONDANSETRON HCL 4 MG/2ML IJ SOLN
4.0000 mg | Freq: Once | INTRAMUSCULAR | Status: AC
  Administered 2020-11-29: 4 mg via INTRAVENOUS
  Filled 2020-11-29: qty 2

## 2020-11-29 MED ORDER — GADOBUTROL 1 MMOL/ML IV SOLN
10.0000 mL | Freq: Once | INTRAVENOUS | Status: AC | PRN
  Administered 2020-11-29: 10 mL via INTRAVENOUS

## 2020-11-29 MED ORDER — ATORVASTATIN CALCIUM 20 MG PO TABS
40.0000 mg | ORAL_TABLET | Freq: Every day | ORAL | Status: DC
  Administered 2020-11-30 – 2020-12-09 (×10): 40 mg via ORAL
  Filled 2020-11-29 (×10): qty 2

## 2020-11-29 MED ORDER — INSULIN DETEMIR 100 UNIT/ML FLEXPEN: 40.0000 [IU] | PEN_INJECTOR | Freq: Two times a day (BID) | SUBCUTANEOUS | Status: DC

## 2020-11-29 MED ORDER — COENZYME Q10 30 MG PO CAPS: 30.0000 mg | ORAL_CAPSULE | Freq: Every day | ORAL | Status: DC

## 2020-11-29 MED ORDER — HYDROXYZINE HCL 10 MG PO TABS
10.0000 mg | ORAL_TABLET | Freq: Three times a day (TID) | ORAL | Status: DC | PRN
  Filled 2020-11-29: qty 1

## 2020-11-29 MED ORDER — ROSUVASTATIN CALCIUM 5 MG PO TABS: 5.0000 mg | ORAL_TABLET | Freq: Every day | ORAL | Status: DC

## 2020-11-29 MED ORDER — INSULIN DETEMIR 100 UNIT/ML ~~LOC~~ SOLN
60.0000 [IU] | Freq: Every day | SUBCUTANEOUS | Status: DC
  Filled 2020-11-29: qty 0.6

## 2020-11-29 MED ORDER — PREGABALIN 75 MG PO CAPS
150.0000 mg | ORAL_CAPSULE | Freq: Every day | ORAL | Status: DC
  Administered 2020-11-30 – 2020-12-09 (×10): 150 mg via ORAL
  Filled 2020-11-29 (×10): qty 2

## 2020-11-29 MED ORDER — DEXTROSE 50 % IV SOLN
INTRAVENOUS | Status: AC
  Administered 2020-11-29: 50 mL via INTRAVENOUS
  Filled 2020-11-29: qty 50

## 2020-11-29 MED ORDER — FUROSEMIDE 40 MG PO TABS: 40.0000 mg | ORAL_TABLET | Freq: Every day | ORAL | Status: DC | PRN

## 2020-11-29 MED ORDER — FUROSEMIDE 40 MG PO TABS: 40.0000 mg | ORAL_TABLET | Freq: Every day | ORAL | Status: DC

## 2020-11-29 MED ORDER — LISINOPRIL 5 MG PO TABS: 5.0000 mg | ORAL_TABLET | Freq: Every day | ORAL | Status: DC

## 2020-11-29 MED ORDER — MONTELUKAST SODIUM 10 MG PO TABS
10.0000 mg | ORAL_TABLET | Freq: Every day | ORAL | Status: DC
  Administered 2020-11-30 – 2020-12-08 (×10): 10 mg via ORAL
  Filled 2020-11-29 (×10): qty 1

## 2020-11-29 MED ORDER — MORPHINE SULFATE (PF) 4 MG/ML IV SOLN
4.0000 mg | INTRAVENOUS | Status: DC | PRN
  Administered 2020-11-29 – 2020-12-08 (×19): 4 mg via INTRAVENOUS
  Filled 2020-11-29 (×20): qty 1

## 2020-11-29 MED ORDER — ALBUTEROL SULFATE HFA 108 (90 BASE) MCG/ACT IN AERS
2.0000 | INHALATION_SPRAY | Freq: Four times a day (QID) | RESPIRATORY_TRACT | Status: DC | PRN
  Filled 2020-11-29: qty 6.7

## 2020-11-29 MED ORDER — FAMOTIDINE 20 MG PO TABS
40.0000 mg | ORAL_TABLET | Freq: Every day | ORAL | Status: DC
  Administered 2020-11-30 – 2020-12-08 (×8): 40 mg via ORAL
  Filled 2020-11-29 (×9): qty 2

## 2020-11-29 MED ORDER — DEXTROSE 50 % IV SOLN: 1.0000 | Freq: Once | INTRAVENOUS | Status: AC

## 2020-11-29 MED ORDER — PIPERACILLIN-TAZOBACTAM 3.375 G IVPB 30 MIN
3.3750 g | Freq: Once | INTRAVENOUS | Status: AC
  Administered 2020-11-29: 3.375 g via INTRAVENOUS
  Filled 2020-11-29: qty 50

## 2020-11-29 MED ORDER — FLUOXETINE HCL 20 MG PO CAPS
40.0000 mg | ORAL_CAPSULE | Freq: Every day | ORAL | Status: DC
  Administered 2020-11-30 – 2020-12-09 (×10): 40 mg via ORAL
  Filled 2020-11-29 (×11): qty 2

## 2020-11-29 MED ORDER — DEXTROSE-NACL 5-0.9 % IV SOLN: INTRAVENOUS | Status: AC

## 2020-11-29 MED ORDER — VANCOMYCIN HCL 2000 MG/400ML IV SOLN
2000.0000 mg | Freq: Once | INTRAVENOUS | Status: AC
  Administered 2020-11-29: 2000 mg via INTRAVENOUS
  Filled 2020-11-29: qty 400

## 2020-11-29 NOTE — ED Notes (Signed)
Pt states CBG accucheck reading 64, given graham crackers, 3 peanut butter cups, and 8oz orange juice.

## 2020-11-29 NOTE — H&P (Addendum)
History and Physical    Emily Collins WNU:[id number] DOB: 1966/09/25 DOA: 11/29/2020  PCP: Oswaldo Conroy, MD  Patient coming from: home  I have personally briefly reviewed patient's old medical records in [facility]  Chief Complaint:  Infected right heel ulcer x 2 weeks now with redness and warmth to right lower extremity  HPI: Emily Collins is a 55 y.o. female with medical history significant of GERD,OA,Asthma,HLD, HTN, Depression/Anxiety,Type I DM complicated by PVD,retinopathy and peripheral neuropathy, CKDIII,who presents to ED with interim history of infected right heal ulcer x 2 weeks. Patient has been treated with antibiotics but despite this noted progression of symptoms. She now presents due to increasing pain, redness and swelling of her right leg. She denies ,fever/chills/ n/v/d/or abdominal pain. She notes no sob , cough or uri symptoms.  ED Course:  Vitals, afeb, bp 129/71, Hr*5, rr 16 Labs: Wbc: 15.6, hgb8.6, plt520 Lactic 1.2 Respiratory panel: pending fs:47 NA133, K 5.5, CO2:21, Glu:168,  Cr 1.40 at baseline  Foot xr: 1. Soft tissue ulcer overlying the posterior calcaneus without underlying radiographic evidence for osteomyelitis. 2. No acute displaced fracture or dislocation. 3. Vascular calcifications. 4. Soft tissue swelling about the foot. IH:[id number],DGLOVFI,EPPIRJJO,ACZYS,AYTK,Z60, Review of Systems: As per HPI otherwise 10 point review of systems negative.   Past Medical History:  Diagnosis Date  . Allergy   . Anxiety   . Arthritis   . Asthma   . Depression   . Diabetes mellitus without complication (HCC)   . GERD (gastroesophageal reflux disease)   . History of chicken pox 04/07/2015   DID have Chicken Pox.    . Hyperlipidemia   . Hypertension   . Neuromuscular disorder (HCC)   . Venous (peripheral) insufficiency     Past Surgical History:  Procedure Laterality Date  . APPENDECTOMY  1979  . BILATERAL CARPAL TUNNEL RELEASE  Bilateral    Dr. Rosita Kea  . CESAREAN SECTION  2003  . LAPAROSCOPIC LYSIS OF ADHESIONS    . SHOULDER SURGERY Right 08/2009   Dr. Rosita Kea  . TUBAL LIGATION       reports that she quit smoking about 3 years ago. Her smoking use included cigarettes. She started smoking about 35 years ago. She has a 30.00 pack-year smoking history. She has never used smokeless tobacco. She reports current alcohol use of about 1.0 - 2.0 standard drink of alcohol per week. She reports that she does not use drugs.  Allergies  Allergen Reactions  . Atorvastatin     Muscle and join pains.  [employer] Setters  [Levofloxacin In D5w]     GI upset  . Simvastatin     joint aches.  . Dilaudid  [Hydromorphone Hcl] Hives, Itching and Rash  . Tetracycline Rash    Family History  Problem Relation Age of Onset  . Hypertension Mother   . Diabetes Mother   . Hyperlipidemia Mother   . Arthritis Mother   . Heart disease Father   . Kidney disease Father     Prior to Admission medications   Medication Sig Start Date End Date Taking? Authorizing Provider  amitriptyline (ELAVIL) 25 MG tablet TAKE 1 TABLET BY MOUTH NIGHTLY AS NEEDED FOR NEUROPATHY PAIN 11/03/20  Yes [provider]  aspirin 81 MG tablet Take 81 mg by mouth daily.   Yes [provider]  atorvastatin (LIPITOR) 40 MG tablet Take 40 mg by mouth daily. 07/07/20 07/07/21 Yes [provider]  Cephalexin 250 MG tablet Take 250 mg by  mouth 4 (four) times daily. 11/24/20  Yes [provider]  cetirizine (ZYRTEC) 10 MG tablet TAKE 1 TABLET(10 MG) BY MOUTH DAILY Patient taking differently: Take 10 mg by mouth daily. 05/16/17  Yes Malva Limes, MD  co-enzyme Q-10 30 MG capsule Take 30 mg by mouth daily.   Yes [provider]  famotidine (PEPCID) 40 MG tablet Take 40 mg by mouth daily. 10/19/20  Yes [provider]  ferrous sulfate 325 (65 FE) MG EC tablet Take 1 tablet (325 mg total) by mouth 2 (two) times daily. 05/06/19 08/26/20  Yes Sainani, Rolly Pancake, MD  FLUoxetine (PROZAC) 40 MG capsule TAKE 1 CAPSULE BY MOUTH EVERY DAY Patient taking differently: Take by mouth daily. 02/12/17  Yes Malva Limes, MD  fluticasone [facility]) 50 MCG/ACT nasal spray SHAKE WELL AND USE 2 SPRAYS IN EACH NOSTRIL DAILY 04/02/17  Yes Malva Limes, MD  furosemide (LASIX) 40 MG tablet TAKE 1 TABLET BY MOUTH AS NEEDED FOR SWELLING Patient taking differently: Take 40 mg by mouth daily. TAKE 1 TABLET BY MOUTH AS NEEDED FOR SWELLING 05/26/18  Yes Malva Limes, MD  hydrOXYzine (ATARAX/VISTARIL) 10 MG tablet Take 10 mg by mouth 3 (three) times daily as needed.   Yes Bender, [name], MD  injection device for insulin (INPEN 100-BLUE-LILLY) DEVI Use daily as needed with Humalog cartridges. BLUE 05/05/20  Yes [provider]  LEVEMIR FLEXTOUCH 100 UNIT/ML FlexPen Inject 40-60 Units into the skin 2 (two) times daily. 60 qam and 40 qhs 10/27/20  Yes [provider]  lisinopril (ZESTRIL) 5 MG tablet Take 5 mg by mouth daily.   Yes [provider]  Melatonin 10 MG TABS Take by mouth.   Yes [provider]  meloxicam (MOBIC) 15 MG tablet TAKE 1 TABLET(15 MG) BY MOUTH DAILY WITH FOOD AS NEEDED FOR ARTHRITIS PAIN 06/15/17  Yes Malva Limes, MD  methocarbamol (ROBAXIN) 500 MG tablet 1-2 tablets every 6 hours prn muscle spasms 11/13/20  Yes Levada Schilling, Rhonda L, PA-C  montelukast (SINGULAIR) 10 MG tablet TAKE 1 TABLET BY MOUTH AT BEDTIME Patient taking differently: Take 10 mg by mouth at bedtime. 05/16/17  Yes Malva Limes, MD  omeprazole (PRILOSEC) 40 MG capsule Take 40 mg by mouth 2 (two) times daily. 11/21/20  Yes [provider]  pregabalin (LYRICA) 150 MG capsule Take 150 mg by mouth daily. 05/10/20 05/10/21 Yes [provider]  PROAIR HFA 108 (90 Base) MCG/ACT inhaler INHALE 2 PUFFS INTO THE LUNGS EVERY 6 HOURS AS NEEDED FOR WHEEZING OR SHORTNESS OF BREATH 02/12/17  Yes Malva Limes, MD  rosuvastatin  (CRESTOR) 5 MG tablet TAKE 1 TABLET(5 MG) BY MOUTH DAILY 01/24/17  Yes Malva Limes, MD  TRULICITY 3 MG/0.5ML SOPN INJECT 0.5 ML UNDER THE SKIN EVERY 7 DAYS 11/18/20  Yes [provider]  ATROVENT HFA 17 MCG/ACT inhaler INHALE 2 PUFFS BY MOUTH FOUR TIMES DAILY FOR COPD 07/30/20   [provider]  gabapentin (NEURONTIN) 600 MG tablet Take 600 mg by mouth at bedtime. Patient not taking: Reported on 11/29/2020    Oswaldo Conroy, MD  HUMALOG KWIKPEN 100 UNIT/ML KiwkPen 5 Units 3 (three) times daily with meals. INJ UP TO 100 UNI [place] PER DAY AS PER MD INSTRUCTIONS Patient not taking: Reported on 11/29/2020 10/02/16   [provider]  HYDROcodone-acetaminophen (NORCO/VICODIN) 5-325 MG tablet Take 1 tablet by mouth every 6 (six) hours as needed for moderate pain. Patient not taking:  No sig reported 11/13/20   Tommi Rumps, PA-C  LANTUS SOLOSTAR 100 UNIT/ML Solostar Pen INJECT 40 UNITS UNDER THE SKIN TWICE DAILY Patient not taking: Reported on 11/29/2020 01/31/17   Malva Limes, MD    Physical Exam: Vitals:   11/29/20 1300 11/29/20 1301 11/29/20 1631 11/29/20 1944  BP: 129/71  (!) 135/56 (!) 118/57  Pulse: 85  93 83  Resp: 16  18 16   Temp: 98.7 F (37.1 C)     TempSrc: Oral     SpO2: 99%  95% 94%  Weight:  104 kg    Height:  5\' 2"  (1.575 m)       Vitals:   11/29/20 1300 11/29/20 1301 11/29/20 1631 11/29/20 1944  BP: 129/71  (!) 135/56 (!) 118/57  Pulse: 85  93 83  Resp: 16  18 16   Temp: 98.7 F (37.1 C)     TempSrc: Oral     SpO2: 99%  95% 94%  Weight:  104 kg    Height:  5\' 2"  (1.575 m)    Constitutional: NAD, calm, comfortable Eyes: PERRL, lids and conjunctivae normal ENMT: Mucous membranes are moist. Posterior pharynx clear of any exudate or lesions.Normal dentition.  Neck: normal, supple, no masses, no thyromegaly Respiratory: clear to auscultation bilaterally, no wheezing, no crackles. Normal respiratory effort. No accessory muscle use.   Cardiovascular: Regular rate and rhythm, no murmurs / rubs / gallops. No extremity edema. Warm ext.  Abdomen: obese,no tenderness, no masses palpated. No hepatosplenomegaly. Bowel sounds positive.  Musculoskeletal: no clubbing / cyanosis. No joint deformity upper and lower extremities. Good ROM, no contractures. Normal muscle tone.  Skin:   Right lower extremity with noted open callus on heal with red swollen foot ,with extension up leg, that is note to be warm and tender to touch. Neurologic: CN 2-12 grossly intact. Sensation intact,  Strength 5/5 in all 4.  Psychiatric: Normal judgment and insight. Alert and oriented x 3. Normal mood.    Labs on Admission: I have personally reviewed following labs and imaging studies  CBC: Recent Labs  Lab 11/23/20 0021 11/29/20 1308  WBC 21.0* 15.6*  NEUTROABS  --  12.0*  HGB 9.3* 8.6*  HCT 29.5* 27.0*  MCV 87.0 85.2  PLT 373 520*   Basic Metabolic Panel: Recent Labs  Lab 11/23/20 0021 11/29/20 1308  NA 135 133*  K 4.2 5.5*  CL 104 104  CO2 22 21*  GLUCOSE 161* 168*  BUN 22* 23*  CREATININE 1.34* 1.40*  CALCIUM 8.4* 8.5*   GFR: Estimated Creatinine Clearance: 51.4 mL/min (A) (by C-G formula based on SCr of 1.4 mg/dL (H)). Liver Function Tests: Recent Labs  Lab 11/23/20 0021 11/29/20 1308  AST 22 28  ALT 24 24  ALKPHOS 94 117  BILITOT 0.5 0.4  PROT 7.8 8.1  ALBUMIN 3.5 3.1*   No results for input(s): LIPASE, AMYLASE in the last 168 hours. No results for input(s): AMMONIA in the last 168 hours. Coagulation Profile: No results for input(s): INR, PROTIME in the last 168 hours. Cardiac Enzymes: No results for input(s): CKTOTAL, CKMB, CKMBINDEX, TROPONINI in the last 168 hours. BNP (last 3 results) No results for input(s): PROBNP in the last 8760 hours. HbA1C: No results for input(s): HGBA1C in the last 72 hours. CBG: Recent Labs  Lab 11/29/20 1929  GLUCAP 47*   Lipid Profile: No results for input(s): CHOL, HDL,  LDLCALC, TRIG, CHOLHDL, LDLDIRECT in the last 72 hours. Thyroid Function Tests: No results  for input(s): TSH, T4TOTAL, FREET4, T3FREE, THYROIDAB in the last 72 hours. Anemia Panel: No results for input(s): VITAMINB12, FOLATE, FERRITIN, TIBC, IRON, RETICCTPCT in the last 72 hours. Urine analysis:    Component Value Date/Time   COLORURINE YELLOW (A) 04/26/2019 1650   APPEARANCEUR CLEAR (A) 04/26/2019 1650   LABSPEC 1.013 04/26/2019 1650   PHURINE 7.0 04/26/2019 1650   GLUCOSEU >=500 (A) 04/26/2019 1650   HGBUR MODERATE (A) 04/26/2019 1650   BILIRUBINUR NEGATIVE 04/26/2019 1650   KETONESUR 5 (A) 04/26/2019 1650   PROTEINUR NEGATIVE 04/26/2019 1650   NITRITE NEGATIVE 04/26/2019 1650   LEUKOCYTESUR SMALL (A) 04/26/2019 1650    Radiological Exams on Admission: US Venous Img Lower Unilateral Right  Result Date: 11/29/2020 CLINICAL DATA:  Pain and swelling. EXAM: RIGHT LOWER EXTREMITY VENOUS DOPPLER ULTRASOUND TECHNIQUE: Gray-scale sonography with compression, as well as color and duplex ultrasound, were performed to evaluate the deep venous system(s) from the level of the common femoral vein through the popliteal and proximal calf veins. COMPARISON:  None. FINDINGS: VENOUS Normal compressibility of the common femoral, superficial femoral, and popliteal veins, as well as the visualized calf veins. Visualized portions of profunda femoral vein and great saphenous vein unremarkable. No filling defects to suggest DVT on grayscale or color Doppler imaging. Doppler waveforms show normal direction of venous flow, normal respiratory plasticity and response to augmentation. Limited views of the contralateral common femoral vein are unremarkable. Limitations: Limited evaluation of the right calf veins due to edema. IMPRESSION: No evidence of DVT with limited visualization of the calf veins due to edema. Electronically Signed   By: Feliberto Harts MD   On: 11/29/2020 17:03   DG Foot Complete Right  Result  Date: 11/29/2020 CLINICAL DATA:  Right heel wound and cellulitis. EXAM: RIGHT FOOT COMPLETE - 3+ VIEW COMPARISON:  None. FINDINGS: There is a ulcer overlying the posterior calcaneus. There is no underlying radiographic evidence for osteomyelitis. There is a large plantar calcaneal spur. There are vascular calcifications. There is soft tissue swelling about the foot without evidence for an acute displaced fracture or dislocation. IMPRESSION: 1. Soft tissue ulcer overlying the posterior calcaneus without underlying radiographic evidence for osteomyelitis. 2. No acute displaced fracture or dislocation. 3. Vascular calcifications. 4. Soft tissue swelling about the foot. Electronically Signed   By: Katherine Mantle M.D.   On: 11/29/2020 16:39    EKG: Independently reviewed.n/a  Assessment/Plan  Complicated soft tissue infection of right leg in back ground of infected diabetic ulcer -of note patient s/p course of antibiotics outpatient with no improvement -foot xray no mention of osteomyelitis /MRI of right heal pending  -place broad spectrum antibiotics  -podiatry consult /wound care   Type I DM -noted hypoglycemic event in ed -resume am long acting insulin at lower dose if fs have stabilized, hold pm dose  at this time  -hold oral hypoglycemic  -monitor with q4h due to episode of hypoglycemia  -place on hypoglycemic protcol -diabetic consult  -check a1c   Mild hyperkalemia -repeat labs, treat base on repeat  Asthma -no acute exacerbation  -prn nebs   GERD -pepcid  HLD -continue statin    HTN -stable  -continue zestril,   Depression/Anxiety -resume home regimen  DVT prophylaxis: heparin Code Status: full Family Communication: n/a Disposition Plan: patient  expected to be admitted greater than 2 midnights Consults called: Podiatry Admission status: inpatient med tele Lurline Del MD [facility]   If 7PM-7AM, please contact  night-coverage [email or web address] Password [email or web address]  11/29/2020, 7:57 PM

## 2020-11-29 NOTE — ED Notes (Signed)
Eating dinner tray  °

## 2020-11-29 NOTE — ED Provider Notes (Signed)
[facility] Emergency Department Provider Note    Event Date/Time   First MD Initiated Contact with Patient 11/29/20 1523     (approximate)  I have reviewed the triage vital signs and the nursing notes.   HISTORY  Chief Complaint Leg Pain    HPI Emily Collins is a 55 y.o. female below listed past medical history presents to the ER for evaluation of worsening right leg pain despite antibiotics.  Does have a history of known diabetic foot ulcer was recently put on outpatient antibiotics due to pain drainage and redness that is started to rapidly worsen causing pain.  Denies any measured fevers or chills at home.    Past Medical History:  Diagnosis Date  . Allergy   . Anxiety   . Arthritis   . Asthma   . Depression   . Diabetes mellitus without complication (HCC)   . GERD (gastroesophageal reflux disease)   . History of chicken pox 04/07/2015   DID have Chicken Pox.    . Hyperlipidemia   . Hypertension   . Neuromuscular disorder (HCC)   . Venous (peripheral) insufficiency    Family History  Problem Relation Age of Onset  . Hypertension Mother   . Diabetes Mother   . Hyperlipidemia Mother   . Arthritis Mother   . Heart disease Father   . Kidney disease Father    Past Surgical History:  Procedure Laterality Date  . APPENDECTOMY  1979  . BILATERAL CARPAL TUNNEL RELEASE Bilateral    Dr. Rosita Kea  . CESAREAN SECTION  2003  . LAPAROSCOPIC LYSIS OF ADHESIONS    . SHOULDER SURGERY Right 08/2009   Dr. Rosita Kea  . TUBAL LIGATION     Patient Active Problem List   Diagnosis Date Noted  . Cellulitis 11/29/2020  . Sepsis (HCC) 04/26/2019  . Arthritis 05/10/2016  . Chronic kidney disease 04/05/2016  . Allergic rhinitis 04/07/2015  . Depression 04/07/2015  . Diabetes mellitus type 1.5,uncontrolled, managed as type 2 04/07/2015  . Diabetic retinopathy (HCC) 04/07/2015  . Dysesthesia 04/07/2015  . Family history of early CAD 04/07/2015  . GERD  (gastroesophageal reflux disease) 04/07/2015  . Hyperlipidemia 04/07/2015  . Insomnia 04/07/2015  . Tobacco abuse 04/07/2015  . Venous insufficiency 04/07/2015  . Anxiety 04/07/2015      Prior to Admission medications   Medication Sig Start Date End Date Taking? Authorizing Provider  amitriptyline (ELAVIL) 25 MG tablet TAKE 1 TABLET BY MOUTH NIGHTLY AS NEEDED FOR NEUROPATHY PAIN 11/03/20  Yes [provider]  aspirin 81 MG tablet Take 81 mg by mouth daily.   Yes [provider]  atorvastatin (LIPITOR) 40 MG tablet Take 40 mg by mouth daily. 07/07/20 07/07/21 Yes [provider]  Cephalexin 250 MG tablet Take 250 mg by mouth 4 (four) times daily. 11/24/20  Yes [provider]  cetirizine (ZYRTEC) 10 MG tablet TAKE 1 TABLET(10 MG) BY MOUTH DAILY Patient taking differently: Take 10 mg by mouth daily. 05/16/17  Yes Malva Limes, MD  co-enzyme Q-10 30 MG capsule Take 30 mg by mouth daily.   Yes [provider]  famotidine (PEPCID) 40 MG tablet Take 40 mg by mouth daily. 10/19/20  Yes [provider]  ferrous sulfate 325 (65 FE) MG EC tablet Take 1 tablet (325 mg total) by mouth 2 (two) times daily. 05/06/19 08/26/20 Yes Sainani, Rolly Pancake, MD  FLUoxetine (PROZAC) 40 MG capsule TAKE 1 CAPSULE BY MOUTH EVERY DAY Patient taking differently:  Take by mouth daily. 02/12/17  Yes Malva Limes, MD  fluticasone [facility]) 50 MCG/ACT nasal spray SHAKE WELL AND USE 2 SPRAYS IN EACH NOSTRIL DAILY 04/02/17  Yes Malva Limes, MD  furosemide (LASIX) 40 MG tablet TAKE 1 TABLET BY MOUTH AS NEEDED FOR SWELLING Patient taking differently: Take 40 mg by mouth daily. TAKE 1 TABLET BY MOUTH AS NEEDED FOR SWELLING 05/26/18  Yes Malva Limes, MD  hydrOXYzine (ATARAX/VISTARIL) 10 MG tablet Take 10 mg by mouth 3 (three) times daily as needed.   Yes Bender, [name], MD  injection device for insulin (INPEN 100-BLUE-LILLY) DEVI Use daily as needed with Humalog  cartridges. BLUE 05/05/20  Yes [provider]  LEVEMIR FLEXTOUCH 100 UNIT/ML FlexPen Inject 40-60 Units into the skin 2 (two) times daily. 60 qam and 40 qhs 10/27/20  Yes [provider]  lisinopril (ZESTRIL) 5 MG tablet Take 5 mg by mouth daily.   Yes [provider]  Melatonin 10 MG TABS Take by mouth.   Yes [provider]  meloxicam (MOBIC) 15 MG tablet TAKE 1 TABLET(15 MG) BY MOUTH DAILY WITH FOOD AS NEEDED FOR ARTHRITIS PAIN 06/15/17  Yes Malva Limes, MD  methocarbamol (ROBAXIN) 500 MG tablet 1-2 tablets every 6 hours prn muscle spasms 11/13/20  Yes Levada Schilling, Rhonda L, PA-C  montelukast (SINGULAIR) 10 MG tablet TAKE 1 TABLET BY MOUTH AT BEDTIME Patient taking differently: Take 10 mg by mouth at bedtime. 05/16/17  Yes Malva Limes, MD  omeprazole (PRILOSEC) 40 MG capsule Take 40 mg by mouth 2 (two) times daily. 11/21/20  Yes [provider]  pregabalin (LYRICA) 150 MG capsule Take 150 mg by mouth daily. 05/10/20 05/10/21 Yes [provider]  PROAIR HFA 108 (90 Base) MCG/ACT inhaler INHALE 2 PUFFS INTO THE LUNGS EVERY 6 HOURS AS NEEDED FOR WHEEZING OR SHORTNESS OF BREATH 02/12/17  Yes Malva Limes, MD  rosuvastatin (CRESTOR) 5 MG tablet TAKE 1 TABLET(5 MG) BY MOUTH DAILY 01/24/17  Yes Malva Limes, MD  TRULICITY 3 MG/0.5ML SOPN INJECT 0.5 ML UNDER THE SKIN EVERY 7 DAYS 11/18/20  Yes [provider]  ATROVENT HFA 17 MCG/ACT inhaler INHALE 2 PUFFS BY MOUTH FOUR TIMES DAILY FOR COPD 07/30/20   [provider]  gabapentin (NEURONTIN) 600 MG tablet Take 600 mg by mouth at bedtime. Patient not taking: Reported on 11/29/2020    Oswaldo Conroy, MD  HUMALOG KWIKPEN 100 UNIT/ML KiwkPen 5 Units 3 (three) times daily with meals. INJ UP TO 100 UNI [place] PER DAY AS PER MD INSTRUCTIONS Patient not taking: Reported on 11/29/2020 10/02/16   [provider]  HYDROcodone-acetaminophen (NORCO/VICODIN) 5-325 MG tablet Take 1 tablet by  mouth every 6 (six) hours as needed for moderate pain. Patient not taking: No sig reported 11/13/20   Tommi Rumps, PA-C  LANTUS SOLOSTAR 100 UNIT/ML Solostar Pen INJECT 40 UNITS UNDER THE SKIN TWICE DAILY Patient not taking: Reported on 11/29/2020 01/31/17   Malva Limes, MD    Allergies Atorvastatin, Levaquin  [levofloxacin in d5w], Simvastatin, Dilaudid  [hydromorphone hcl], and Tetracycline    Social History Social History   Tobacco Use  . Smoking status: Former Smoker    Packs/day: 1.00    Years: 30.00    Pack years: 30.00    Types: Cigarettes    Start date: 04/06/1985    Quit date: 09/25/2017    Years since quitting: 3.1  . Smokeless tobacco: Never Used  Vaping  Use  . Vaping Use: Never used  Substance Use Topics  . Alcohol use: Yes    Alcohol/week: 1.0 - 2.0 standard drink    Types: 1 - 2 Glasses of wine per week    Comment: Socially  . Drug use: No    Review of Systems Patient denies headaches, rhinorrhea, blurry vision, numbness, shortness of breath, chest pain, edema, cough, abdominal pain, nausea, vomiting, diarrhea, dysuria, fevers, rashes or hallucinations unless otherwise stated above in HPI. ____________________________________________   PHYSICAL EXAM:  VITAL SIGNS: Vitals:   11/29/20 2030 11/29/20 2100  BP: (!) 113/55 100/69  Pulse: 76 72  Resp:    Temp:    SpO2: 95% 92%    Constitutional: Alert and oriented.  Eyes: Conjunctivae are normal.  Head: Atraumatic. Nose: No congestion/rhinnorhea. Mouth/Throat: Mucous membranes are moist.   Neck: No stridor. Painless ROM.  Cardiovascular: Normal rate, regular rhythm. Grossly normal heart sounds.  Good peripheral circulation. Respiratory: Normal respiratory effort.  No retractions. Lungs CTAB. Gastrointestinal: Soft and nontender. No distention. No abdominal bruits. No CVA tenderness. Genitourinary:  Musculoskeletal: Tenderness to palpation of the right leg with chronic appearing diabetic foot  ulcer with some seropurulent drainage.  No fluctuance.  Does have edema and erythema streaking rising approximately of the right leg.  Brisk cap refill to bilateral feet.  Difficult to palpate DP or PT pulses.  No joint effusions. Neurologic:  Normal speech and language. No gross focal neurologic deficits are appreciated. No facial droop Skin:  Skin is warm, dry and intact. No rash noted. Psychiatric: Mood and affect are normal. Speech and behavior are normal.  ____________________________________________   LABS (all labs ordered are listed, but only abnormal results are displayed)  Results for orders placed or performed during the hospital encounter of 11/29/20 (from the past 24 hour(s))  Lactic acid, plasma     Status: None   Collection Time: 11/29/20  1:08 PM  Result Value Ref Range   Lactic Acid, Venous 1.2 0.5 - 1.9 mmol/L  Comprehensive metabolic panel     Status: Abnormal   Collection Time: 11/29/20  1:08 PM  Result Value Ref Range   Sodium 133 (L) 135 - 145 mmol/L   Potassium 5.5 (H) 3.5 - 5.1 mmol/L   Chloride 104 98 - 111 mmol/L   CO2 21 (L) 22 - 32 mmol/L   Glucose, Bld 168 (H) 70 - 99 mg/dL   BUN 23 (H) 6 - 20 mg/dL   Creatinine, Ser 0.16 (H) 0.44 - 1.00 mg/dL   Calcium 8.5 (L) 8.9 - 10.3 mg/dL   Total Protein 8.1 6.5 - 8.1 g/dL   Albumin 3.1 (L) 3.5 - 5.0 g/dL   AST 28 15 - 41 U/L   ALT 24 0 - 44 U/L   Alkaline Phosphatase 117 38 - 126 U/L   Total Bilirubin 0.4 0.3 - 1.2 mg/dL   GFR, Estimated 44 (L) >60 mL/min   Anion gap 8 5 - 15  CBC with Differential     Status: Abnormal   Collection Time: 11/29/20  1:08 PM  Result Value Ref Range   WBC 15.6 (H) 4.0 - 10.5 K/uL   RBC 3.17 (L) 3.87 - 5.11 MIL/uL   Hemoglobin 8.6 (L) 12.0 - 15.0 g/dL   HCT 01.0 (L) 93.2 - 35.5 %   MCV 85.2 80.0 - 100.0 fL   MCH 27.1 26.0 - 34.0 pg   MCHC 31.9 30.0 - 36.0 g/dL   RDW 73.2 (H) 20.2 - 54.2 %  Platelets 520 (H) 150 - 400 K/uL   nRBC 0.0 0.0 - 0.2 %   Neutrophils Relative % 76  %   Neutro Abs 12.0 (H) 1.7 - 7.7 K/uL   Lymphocytes Relative 12 %   Lymphs Abs 1.9 0.7 - 4.0 K/uL   Monocytes Relative 5 %   Monocytes Absolute 0.7 0.1 - 1.0 K/uL   Eosinophils Relative 5 %   Eosinophils Absolute 0.7 (H) 0.0 - 0.5 K/uL   Basophils Relative 1 %   Basophils Absolute 0.1 0.0 - 0.1 K/uL   Immature Granulocytes 1 %   Abs Immature Granulocytes 0.12 (H) 0.00 - 0.07 K/uL  CBG monitoring, ED     Status: Abnormal   Collection Time: 11/29/20  7:29 PM  Result Value Ref Range   Glucose-Capillary 47 (L) 70 - 99 mg/dL  CBG monitoring, ED     Status: Abnormal   Collection Time: 11/29/20  8:02 PM  Result Value Ref Range   Glucose-Capillary 148 (H) 70 - 99 mg/dL  CBG monitoring, ED     Status: Abnormal   Collection Time: 11/29/20  8:30 PM  Result Value Ref Range   Glucose-Capillary 122 (H) 70 - 99 mg/dL  CBG monitoring, ED     Status: Abnormal   Collection Time: 11/29/20  9:04 PM  Result Value Ref Range   Glucose-Capillary 103 (H) 70 - 99 mg/dL   ____________________________________________ ____________________________________________  RADIOLOGY  I personally reviewed all radiographic images ordered to evaluate for the above acute complaints and reviewed radiology reports and findings.  These findings were personally discussed with the patient.  Please see medical record for radiology report.  ____________________________________________   PROCEDURES  Procedure(s) performed:  Procedures    Critical Care performed: no ____________________________________________   INITIAL IMPRESSION / ASSESSMENT AND PLAN / ED COURSE  Pertinent labs & imaging results that were available during my care of the patient were reviewed by me and considered in my medical decision making (see chart for details).   DDX: Cellulitis, abscess, DVT, claudication  Emily Collins is a 55 y.o. who presents to the ED with presentation as described above.  Patient with evidence of worsening  cellulitis despite outpatient treatment.  No gas.  Diabetic foot wound likely source of infection now with streaking cellulitis going more proximally.  Likely contributing presentation is probable PAD.  Biphasic Doppler signal present.  No sign of DVT.  Have ordered IV antibiotics.  Will discuss with hospitalist for admission.     The patient was evaluated in Emergency Department today for the symptoms described in the history of present illness. He/she was evaluated in the context of the global COVID-19 pandemic, which necessitated consideration that the patient might be at risk for infection with the SARS-CoV-2 virus that causes COVID-19. Institutional protocols and algorithms that pertain to the evaluation of patients at risk for COVID-19 are in a state of rapid change based on information released by regulatory bodies including the CDC and federal and state organizations. These policies and algorithms were followed during the patient's care in the ED.  As part of my medical decision making, I reviewed the following data within the electronic MEDICAL RECORD NUMBER Nursing notes reviewed and incorporated, Labs reviewed, notes from prior ED visits and [place] Controlled Substance Database   ____________________________________________   FINAL CLINICAL IMPRESSION(S) / ED DIAGNOSES  Final diagnoses:  Right leg pain  Cellulitis of right lower extremity      NEW MEDICATIONS STARTED DURING THIS VISIT:  New  Prescriptions   No medications on file     Note:  This document was prepared using Dragon voice recognition software and may include unintentional dictation errors.    Willy Eddyobinson, Kimmberly Wisser, MD 11/29/20 2107

## 2020-11-29 NOTE — Progress Notes (Signed)
PHARMACY -  BRIEF ANTIBIOTIC NOTE   Pharmacy has received consult(s) for Vancomycin from an ED provider.  The patient's profile has been reviewed for ht/wt/allergies/indication/available labs.    One time order(s) placed for Vancomycin 2g IV x 1 dose.  Further antibiotics/pharmacy consults should be ordered by admitting physician if indicated.                       Thank you, Bettey Costa 11/29/2020  4:08 PM

## 2020-11-29 NOTE — ED Notes (Signed)
Pt called this RN to room and showed CBG check on her phone of 49.  RN informed Dr. Roxan Hockey and order for 1 amp D50 given.

## 2020-11-29 NOTE — ED Notes (Signed)
Pt given 8 oz orange juice.  

## 2020-11-29 NOTE — ED Triage Notes (Signed)
C/O right heel ulcer. States she has had ulcer x 2 weeks.  Has been on antibiotics.  C/O burning sensation to ulcer since Friday.   Right lower leg red and warm to touch.  CBG:  158

## 2020-11-29 NOTE — ED Notes (Signed)
Pt reports ulcer to right heel.  Pt has trouble walking.  Type 1 diabetic.  Pt spoke to her doctor today and was told to come to er for eval.   Swelling and redness noted rto right lower leg.  Pt alert  Speech clear.

## 2020-11-30 ENCOUNTER — Encounter: Payer: Self-pay | Admitting: Internal Medicine

## 2020-11-30 ENCOUNTER — Other Ambulatory Visit: Payer: Self-pay

## 2020-11-30 DIAGNOSIS — L03115 Cellulitis of right lower limb: Secondary | ICD-10-CM | POA: Diagnosis not present

## 2020-11-30 LAB — SARS CORONAVIRUS 2 (TAT 6-24 HRS): SARS Coronavirus 2: NEGATIVE

## 2020-11-30 LAB — GLUCOSE, CAPILLARY
Glucose-Capillary: 127 mg/dL — ABNORMAL HIGH (ref 70–99)
Glucose-Capillary: 166 mg/dL — ABNORMAL HIGH (ref 70–99)
Glucose-Capillary: 256 mg/dL — ABNORMAL HIGH (ref 70–99)
Glucose-Capillary: 277 mg/dL — ABNORMAL HIGH (ref 70–99)
Glucose-Capillary: 449 mg/dL — ABNORMAL HIGH (ref 70–99)
Glucose-Capillary: 68 mg/dL — ABNORMAL LOW (ref 70–99)

## 2020-11-30 LAB — CBC
HCT: 26.1 % — ABNORMAL LOW (ref 36.0–46.0)
Hemoglobin: 8.2 g/dL — ABNORMAL LOW (ref 12.0–15.0)
MCH: 27.2 pg (ref 26.0–34.0)
MCHC: 31.4 g/dL (ref 30.0–36.0)
MCV: 86.7 fL (ref 80.0–100.0)
Platelets: 517 10*3/uL — ABNORMAL HIGH (ref 150–400)
RBC: 3.01 MIL/uL — ABNORMAL LOW (ref 3.87–5.11)
RDW: 16.3 % — ABNORMAL HIGH (ref 11.5–15.5)
WBC: 14.7 10*3/uL — ABNORMAL HIGH (ref 4.0–10.5)
nRBC: 0 % (ref 0.0–0.2)

## 2020-11-30 LAB — COMPREHENSIVE METABOLIC PANEL
ALT: 21 U/L (ref 0–44)
AST: 21 U/L (ref 15–41)
Albumin: 2.8 g/dL — ABNORMAL LOW (ref 3.5–5.0)
Alkaline Phosphatase: 105 U/L (ref 38–126)
Anion gap: 9 (ref 5–15)
BUN: 27 mg/dL — ABNORMAL HIGH (ref 6–20)
CO2: 20 mmol/L — ABNORMAL LOW (ref 22–32)
Calcium: 8.2 mg/dL — ABNORMAL LOW (ref 8.9–10.3)
Chloride: 106 mmol/L (ref 98–111)
Creatinine, Ser: 1.73 mg/dL — ABNORMAL HIGH (ref 0.44–1.00)
GFR, Estimated: 34 mL/min — ABNORMAL LOW (ref >60)
Glucose, Bld: 176 mg/dL — ABNORMAL HIGH (ref 70–99)
Potassium: 5.2 mmol/L — ABNORMAL HIGH (ref 3.5–5.1)
Sodium: 135 mmol/L (ref 135–145)
Total Bilirubin: 0.6 mg/dL (ref 0.3–1.2)
Total Protein: 7.5 g/dL (ref 6.5–8.1)

## 2020-11-30 LAB — C-REACTIVE PROTEIN: CRP: 24.6 mg/dL — ABNORMAL HIGH (ref <1.0)

## 2020-11-30 LAB — HEMOGLOBIN A1C
Hgb A1c MFr Bld: 10.2 % — ABNORMAL HIGH (ref 4.8–5.6)
Mean Plasma Glucose: 246.04 mg/dL

## 2020-11-30 LAB — HIV ANTIBODY (ROUTINE TESTING W REFLEX): HIV Screen 4th Generation wRfx: NONREACTIVE

## 2020-11-30 MED ORDER — DOCUSATE SODIUM 100 MG PO CAPS
100.0000 mg | ORAL_CAPSULE | Freq: Two times a day (BID) | ORAL | Status: DC
  Administered 2020-11-30 – 2020-12-09 (×14): 100 mg via ORAL
  Filled 2020-11-30 (×19): qty 1

## 2020-11-30 MED ORDER — METRONIDAZOLE IN NACL 5-0.79 MG/ML-% IV SOLN
500.0000 mg | Freq: Three times a day (TID) | INTRAVENOUS | Status: DC
  Administered 2020-11-30 – 2020-12-03 (×9): 500 mg via INTRAVENOUS
  Filled 2020-11-30 (×11): qty 100

## 2020-11-30 MED ORDER — SODIUM CHLORIDE 0.9 % IV SOLN
2.0000 g | INTRAVENOUS | Status: DC
  Administered 2020-11-30 – 2020-12-07 (×8): 2 g via INTRAVENOUS
  Filled 2020-11-30: qty 20
  Filled 2020-11-30: qty 2
  Filled 2020-11-30: qty 20
  Filled 2020-11-30: qty 2
  Filled 2020-11-30: qty 20
  Filled 2020-11-30: qty 2
  Filled 2020-11-30 (×2): qty 20
  Filled 2020-11-30: qty 2
  Filled 2020-11-30: qty 20

## 2020-11-30 MED ORDER — INSULIN GLARGINE 100 UNIT/ML ~~LOC~~ SOLN
60.0000 [IU] | Freq: Every day | SUBCUTANEOUS | Status: DC
  Administered 2020-12-01: 60 [IU] via SUBCUTANEOUS
  Filled 2020-11-30 (×2): qty 0.6

## 2020-11-30 MED ORDER — LACTATED RINGERS IV SOLN: INTRAVENOUS | Status: AC

## 2020-11-30 MED ORDER — INSULIN ASPART 100 UNIT/ML ~~LOC~~ SOLN
0.0000 [IU] | Freq: Three times a day (TID) | SUBCUTANEOUS | Status: DC
  Administered 2020-11-30: 11 [IU] via SUBCUTANEOUS
  Administered 2020-12-01: 4 [IU] via SUBCUTANEOUS
  Administered 2020-12-01: 11 [IU] via SUBCUTANEOUS
  Administered 2020-12-02 (×2): 20 [IU] via SUBCUTANEOUS
  Administered 2020-12-03 – 2020-12-07 (×5): 3 [IU] via SUBCUTANEOUS
  Administered 2020-12-08: 7 [IU] via SUBCUTANEOUS
  Administered 2020-12-08: 4 [IU] via SUBCUTANEOUS
  Administered 2020-12-08: 8.5 [IU] via SUBCUTANEOUS
  Filled 2020-11-30 (×14): qty 1

## 2020-11-30 MED ORDER — INSULIN ASPART 100 UNIT/ML ~~LOC~~ SOLN
9.0000 [IU] | Freq: Once | SUBCUTANEOUS | Status: AC
  Administered 2020-11-30: 9 [IU] via SUBCUTANEOUS
  Filled 2020-11-30: qty 1

## 2020-11-30 MED ORDER — CALCIUM CARBONATE ANTACID 500 MG PO CHEW
1.0000 | CHEWABLE_TABLET | Freq: Two times a day (BID) | ORAL | Status: DC
  Administered 2020-11-30 – 2020-12-09 (×20): 200 mg via ORAL
  Filled 2020-11-30 (×20): qty 1

## 2020-11-30 MED ORDER — PIPERACILLIN-TAZOBACTAM 3.375 G IVPB
3.3750 g | Freq: Three times a day (TID) | INTRAVENOUS | Status: DC
  Administered 2020-11-30: 3.375 g via INTRAVENOUS
  Filled 2020-11-30: qty 50

## 2020-11-30 MED ORDER — INSULIN GLARGINE 100 UNIT/ML ~~LOC~~ SOLN
30.0000 [IU] | Freq: Every morning | SUBCUTANEOUS | Status: DC
  Administered 2020-11-30: 30 [IU] via SUBCUTANEOUS
  Filled 2020-11-30: qty 0.3

## 2020-11-30 MED ORDER — VANCOMYCIN HCL 750 MG/150ML IV SOLN
750.0000 mg | INTRAVENOUS | Status: DC
  Administered 2020-11-30 – 2020-12-04 (×5): 750 mg via INTRAVENOUS
  Filled 2020-11-30 (×7): qty 150

## 2020-11-30 MED ORDER — INSULIN GLARGINE 100 UNIT/ML ~~LOC~~ SOLN
40.0000 [IU] | Freq: Every day | SUBCUTANEOUS | Status: DC
  Filled 2020-11-30: qty 0.4

## 2020-11-30 MED ORDER — INSULIN ASPART 100 UNIT/ML ~~LOC~~ SOLN
0.0000 [IU] | Freq: Every day | SUBCUTANEOUS | Status: DC
  Administered 2020-12-01 – 2020-12-08 (×3): 2 [IU] via SUBCUTANEOUS
  Filled 2020-11-30 (×3): qty 1

## 2020-11-30 NOTE — Progress Notes (Addendum)
Progress Note    Emily Collins  SWH:[id number] DOB: 09/08/1966  DOA: 11/29/2020 PCP: Oswaldo Conroy, MD      Brief Narrative:    Medical records reviewed and are as summarized below:  Emily Collins is a 55 y.o. female with medical history significant for GERD, type I DM, osteoarthritis, asthma, hyperlipidemia, hypertension, depression, anxiety, PVD, retinopathy, peripheral neuropathy, CKD stage III.  She presented to the hospital because of infected wound on the right heel.  She says she noticed the wound about 2 weeks ago.  She noticed increasing pain and redness to the right foot.  She was prescribed oral antibiotics as an outpatient which she took that for about 2 days.  However, symptoms were getting worse and there was no improvement so she presented to the hospital for further evaluation.  She was admitted to the hospital for right leg/foot cellulitis.  MRI of the right ankle did not show symptoms of osteomyelitis but it was concerning for phlegmon/developing abscess with associated extensor tenosynovitis.  She was treated with empiric IV antibiotics.  Podiatrist was consulted to assist with management.  She was hyperglycemic on admission.  She attributed this to taking insulin on the morning of admission and not eating in the emergency room.  She developed AKI that was treated with IV fluids.  Assessment/Plan:   Active Problems:   Cellulitis   Body mass index is 41.85 kg/m.  (Morbid obesity)   Right leg/foot cellulitis with right diabetic foot wound infection: MRI concerning for developing abscess with associated extensor tenosynovitis.  Continue empiric IV antibiotics.  Analgesics as needed for pain.  Consulted podiatrist to assist with management.  AKI on CKD stage IIIa complicated by hyperkalemia: Hold Lasix and lisinopril.  Treat with IV fluids.  Monitor BMP closely.  Recent hypoglycemia, IDDM with hyperglycemia: Hemoglobin A1c was 10.2.  Continue Lantus  and Novolog.  Trulicity is on hold.  Other comorbidities include depression, anxiety, hypertension, hyperlipidemia, asthma   Diet Order            Diet 2 gram sodium Room service appropriate? Yes; Fluid consistency: Thin  Diet effective now                    Consultants:  Podiatrist  Procedures:  None    Medications:   . amitriptyline  25 mg Oral QHS  . aspirin EC  81 mg Oral Daily  . atorvastatin  40 mg Oral Daily  . enoxaparin (LOVENOX) injection  0.5 mg/kg Subcutaneous Q24H  . famotidine  40 mg Oral Daily  . FLUoxetine  40 mg Oral Daily  . furosemide  40 mg Oral Daily  . insulin aspart  0-6 Units Subcutaneous Q4H  . insulin glargine  30 Units Subcutaneous q morning  . ipratropium  2 puff Inhalation Q4H  . [START ON 12/01/2020] lisinopril  5 mg Oral Daily  . loratadine  10 mg Oral Daily  . montelukast  10 mg Oral QHS  . pantoprazole  40 mg Oral Daily  . pregabalin  150 mg Oral Daily  . rosuvastatin  5 mg Oral Daily   Continuous Infusions: . lactated ringers    . piperacillin-tazobactam (ZOSYN)  IV 3.375 g (11/30/20 0435)  . vancomycin       Anti-infectives (From admission, onward)   Start     Dose/Rate Route Frequency Ordered Stop   11/30/20 0600  vancomycin (VANCOREADY) IVPB 750 mg/150 mL  750 mg 150 mL/hr over 60 Minutes Intravenous Every 24 hours 11/30/20 0250     11/30/20 0300  piperacillin-tazobactam (ZOSYN) IVPB 3.375 g        3.375 g 12.5 mL/hr over 240 Minutes Intravenous Every 8 hours 11/30/20 0218     11/29/20 1615  piperacillin-tazobactam (ZOSYN) IVPB 3.375 g        3.375 g 100 mL/hr over 30 Minutes Intravenous  Once 11/29/20 1601 11/29/20 1654   11/29/20 1615  vancomycin (VANCOREADY) IVPB 2000 mg/400 mL        2,000 mg 200 mL/hr over 120 Minutes Intravenous  Once 11/29/20 1608 11/29/20 1838             Family Communication/Anticipated D/C date and plan/Code Status   DVT prophylaxis:      Code Status: Full  Code  Family Communication: None Disposition Plan:    Status is: Inpatient  Remains inpatient appropriate because:IV treatments appropriate due to intensity of illness or inability to take PO   Dispo: The patient is from: Home              Anticipated d/c is to: Home              Patient currently is not medically stable to d/c.   Difficult to place patient No           Subjective:   C/o pain in the right foot  Objective:    Vitals:   11/29/20 2130 11/29/20 2250 11/30/20 0353 11/30/20 0800  BP: (!) 125/49 137/63 (!) 117/56 (!) 126/107  Pulse: 84 88 79 88  Resp: 16 20 16 16   Temp:  97.8 F (36.6 C) 97.9 F (36.6 C) 98.1 F (36.7 C)  TempSrc:  Oral    SpO2: 96% 100% 95% 96%  Weight:   103.8 kg   Height:       No data found.   Intake/Output Summary (Last 24 hours) at 11/30/2020 0845 Last data filed at 11/30/2020 0353 Gross per 24 hour  Intake 120 ml  Output 0 ml  Net 120 ml   Filed Weights   11/29/20 1301 11/30/20 0353  Weight: 104 kg 103.8 kg    Exam:  GEN: NAD SKIN: Warm and dry EYES: EOMI, no pallor or icterus ENT: MMM CV: RRR PULM: CTA B ABD: soft, obese, NT, +BS CNS: AAO x 3, non focal EXT: Erythema,swelling and tenderness of the distal half of right leg. Wound on the posterior foot/calcaneus        Data Reviewed:   I have personally reviewed following labs and imaging studies:  Labs: Labs show the following:   Basic Metabolic Panel: Recent Labs  Lab 11/29/20 1308 11/29/20 2258 11/30/20 0432  NA 133* 134* 135  K 5.5* 5.0 5.2*  CL 104 105 106  CO2 21* 21* 20*  GLUCOSE 168* 94 176*  BUN 23* 26* 27*  CREATININE 1.40* 1.65* 1.73*  CALCIUM 8.5* 8.3* 8.2*  MG  --  2.6*  --   PHOS  --  4.4  --    GFR Estimated Creatinine Clearance: 41.5 mL/min (A) (by C-G formula based on SCr of 1.73 mg/dL (H)). Liver Function Tests: Recent Labs  Lab 11/29/20 1308 11/30/20 0432  AST 28 21  ALT 24 21  ALKPHOS 117 105  BILITOT 0.4  0.6  PROT 8.1 7.5  ALBUMIN 3.1* 2.8*   No results for input(s): LIPASE, AMYLASE in the last 168 hours. No results for input(s): AMMONIA in the  last 168 hours. Coagulation profile No results for input(s): INR, PROTIME in the last 168 hours.  CBC: Recent Labs  Lab 11/29/20 1308 11/29/20 2258 11/30/20 0432  WBC 15.6* 16.1* 14.7*  NEUTROABS 12.0*  --   --   HGB 8.6* 8.5* 8.2*  HCT 27.0* 27.5* 26.1*  MCV 85.2 87.3 86.7  PLT 520* 548* 517*   Cardiac Enzymes: No results for input(s): CKTOTAL, CKMB, CKMBINDEX, TROPONINI in the last 168 hours. BNP (last 3 results) No results for input(s): PROBNP in the last 8760 hours. CBG: Recent Labs  Lab 11/29/20 2104 11/29/20 2144 11/30/20 0024 11/30/20 0358 11/30/20 0803  GLUCAP 103* 84 127* 166* 277*   D-Dimer: No results for input(s): DDIMER in the last 72 hours. Hgb A1c: Recent Labs    11/29/20 2258  HGBA1C 10.2*   Lipid Profile: No results for input(s): CHOL, HDL, LDLCALC, TRIG, CHOLHDL, LDLDIRECT in the last 72 hours. Thyroid function studies: Recent Labs    11/29/20 2258  TSH 4.501*   Anemia work up: No results for input(s): VITAMINB12, FOLATE, FERRITIN, TIBC, IRON, RETICCTPCT in the last 72 hours. Sepsis Labs: Recent Labs  Lab 11/29/20 1308 11/29/20 2258 11/30/20 0432  WBC 15.6* 16.1* 14.7*  LATICACIDVEN 1.2  --   --     Microbiology Recent Results (from the past 240 hour(s))  SARS CORONAVIRUS 2 (TAT 6-24 HRS) Nasopharyngeal Nasopharyngeal Swab     Status: None   Collection Time: 11/29/20  4:20 PM   Specimen: Nasopharyngeal Swab  Result Value Ref Range Status   SARS Coronavirus 2 NEGATIVE NEGATIVE Final    Comment: (NOTE) SARS-CoV-2 target nucleic acids are NOT DETECTED.  The SARS-CoV-2 RNA is generally detectable in upper and lower respiratory specimens during the acute phase of infection. Negative results do not preclude SARS-CoV-2 infection, do not rule out co-infections with other pathogens, and  should not be used as the sole basis for treatment or other patient management decisions. Negative results must be combined with clinical observations, patient history, and epidemiological information. The expected result is Negative.  Fact Sheet for Patients: HairSlick.no  Fact Sheet for Healthcare Providers: quierodirigir.com  This test is not yet approved or cleared by the [place] FDA and  has been authorized for detection and/or diagnosis of SARS-CoV-2 by FDA under an Emergency Use Authorization (EUA). This EUA will remain  in effect (meaning this test can be used) for the duration of the COVID-19 declaration under Se ction 564(b)(1) of the Act, 21 U.S.C. section 360bbb-3(b)(1), unless the authorization is terminated or revoked sooner.  Performed at [facility], [address], [place], [place] [address]     Procedures and diagnostic studies:  MR ANKLE RIGHT W WO CONTRAST  Result Date: 11/30/2020 CLINICAL DATA:  Posterior calcaneal diabetic ulcer EXAM: MRI OF THE RIGHT ANKLE WITHOUT AND WITH CONTRAST TECHNIQUE: Multiplanar, multisequence MR imaging of the ankle was performed before and after the administration of intravenous contrast. CONTRAST:  39mL GADAVIST GADOBUTROL 1 MMOL/ML IV SOLN COMPARISON:  X-ray 11/29/2020 FINDINGS: TENDONS Peroneal: Tendinosis with short segment longitudinal split tear of the infra-malleolar aspect of the peroneus brevis tendon. Intact peroneus longus. Trace tenosynovial fluid. Posteromedial: Intact tibialis posterior, flexor hallucis longus and flexor digitorum longus tendons. Anterior: Intact tibialis anterior, extensor hallucis longus and extensor digitorum longus tendons. Tenosynovial fluid associated with the anterior ankle tendons at the level of the tibiotalar joint. Achilles: Intact. Plantar Fascia: Intact mild perifascial edema. LIGAMENTS Lateral: The anterior and posterior  tibiofibular ligaments  are intact. The anterior and posterior talofibular ligaments are intact. Intact calcaneofibular ligament. Medial: Deltoid ligament and spring ligament complex intact. CARTILAGE Ankle Joint: No cartilage defect. Small amount of fluid posterior to the tibiotalar joint. Subtalar Joints/Sinus Tarsi: No joint effusion or chondral defect. Preservation of the anatomic fat within the sinus tarsi. Bones: No acute fracture. No malalignment. No bony erosion or cortical destruction. No bone marrow edema. Preservation of the T1 fatty bone marrow signal. Other: Small superficial soft tissue ulceration overlies the posteromedial aspect of the calcaneus. No underlying sinus tract or fluid collection. Circumferential subcutaneous edema. Somewhat ill-defined, partially peripherally enhancing fluid collection at the anterior aspect of the ankle surrounding the extensor tendons primarily located between the superior and inferior extensor retinacula measuring approximately 5.9 x 2.2 x 3.7 cm (series 11, image 7-18). Collection extends around the lateral aspect of the hindfoot anterior to the peroneal tendons. IMPRESSION: IMPRESSION 1. Small superficial soft tissue ulceration overlies the posteromedial aspect of the calcaneus with diffuse ankle edema/cellulitis. No underlying sinus tract or fluid collection. Negative for acute osteomyelitis. 2. Partially rim-enhancing fluid collection at the anterior aspect of the ankle surrounding the extensor tendons in total measuring approximately 5.9 x 2.2 x 3.7 cm. Findings suspicious for phlegmon/developing abscess with associated extensor tenosynovitis. 3. Tendinosis with short segment longitudinal split tear of the infra-malleolar aspect of the peroneus brevis tendon. Electronically Signed   By: Duanne GuessNicholas  Plundo D.O.   On: 11/30/2020 08:21   US Venous Img Lower Unilateral Right  Result Date: 11/29/2020 CLINICAL DATA:  Pain and swelling. EXAM: RIGHT LOWER EXTREMITY  VENOUS DOPPLER ULTRASOUND TECHNIQUE: Gray-scale sonography with compression, as well as color and duplex ultrasound, were performed to evaluate the deep venous system(s) from the level of the common femoral vein through the popliteal and proximal calf veins. COMPARISON:  None. FINDINGS: VENOUS Normal compressibility of the common femoral, superficial femoral, and popliteal veins, as well as the visualized calf veins. Visualized portions of profunda femoral vein and great saphenous vein unremarkable. No filling defects to suggest DVT on grayscale or color Doppler imaging. Doppler waveforms show normal direction of venous flow, normal respiratory plasticity and response to augmentation. Limited views of the contralateral common femoral vein are unremarkable. Limitations: Limited evaluation of the right calf veins due to edema. IMPRESSION: No evidence of DVT with limited visualization of the calf veins due to edema. Electronically Signed   By: Feliberto HartsFrederick S Jones MD   On: 11/29/2020 17:03   DG Foot Complete Right  Result Date: 11/29/2020 CLINICAL DATA:  Right heel wound and cellulitis. EXAM: RIGHT FOOT COMPLETE - 3+ VIEW COMPARISON:  None. FINDINGS: There is a ulcer overlying the posterior calcaneus. There is no underlying radiographic evidence for osteomyelitis. There is a large plantar calcaneal spur. There are vascular calcifications. There is soft tissue swelling about the foot without evidence for an acute displaced fracture or dislocation. IMPRESSION: 1. Soft tissue ulcer overlying the posterior calcaneus without underlying radiographic evidence for osteomyelitis. 2. No acute displaced fracture or dislocation. 3. Vascular calcifications. 4. Soft tissue swelling about the foot. Electronically Signed   By: Katherine Mantlehristopher  Green M.D.   On: 11/29/2020 16:39               LOS: 1 day   Victorious Kundinger  [facility]   Pager on [email or web address]. If 7PM-7AM, please contact night-coverage at  [email or web address]     11/30/2020, 8:45 AM

## 2020-11-30 NOTE — Progress Notes (Signed)
Pharmacy Antibiotic Note  Emily Collins is a 55 y.o. female admitted on 11/29/2020 with cellulitis.  Pharmacy has been consulted for Zosyn and Vancomycin dosing.  Pt presents to ER with R heal ulcer x 2 wks and being treated with PO Abx.  Med hx includesType I DM w/ PVD, CKDIII, HTN, & peripheral neuropathy.  Plan: Zosyn 3.375g IV q8h (4 hour infusion). - per indication and SCr.  Vancomycin 750 mg IV Q 24 hrs.  Goal AUC 400-550. Expected AUC: 485.8 SCr used: 1.65, Vd Coef: 0.5 BMI: 41.94  Pharmacy will continue to monitor for lab cx. Will continue to follow SCr and adjust abx dosing if warranted.  Height: 5\' 2"  (157.5 cm) Weight: 104 kg (229 lb 4.5 oz) IBW/kg (Calculated) : 50.1  Temp (24hrs), Avg:98.3 F (36.8 C), Min:97.8 F (36.6 C), Max:98.7 F (37.1 C)  Recent Labs  Lab 11/29/20 1308 11/29/20 2258  WBC 15.6* 16.1*  CREATININE 1.40* 1.65*  LATICACIDVEN 1.2  --     Estimated Creatinine Clearance: 43.6 mL/min (A) (by C-G formula based on SCr of 1.65 mg/dL (H)).    Allergies  Allergen Reactions  . Atorvastatin     Muscle and join pains.  01/29/21  [Levofloxacin In D5w]     GI upset  . Simvastatin     joint aches.  . Dilaudid  [Hydromorphone Hcl] Hives, Itching and Rash  . Tetracycline Rash    Antimicrobials this admission: 03/07 Zosyn >>  03/07 Vancomycin >>    Microbiology results: No BCx pending at this time.  Thank you for allowing pharmacy to be a part of this patient's care.  05/07, PharmD, [facility] 11/30/2020 3:37 AM

## 2020-11-30 NOTE — Progress Notes (Addendum)
Inpatient Diabetes Program Recommendations  AACE/ADA: New Consensus Statement on Inpatient Glycemic Control (2015)  Target Ranges:  Prepandial:   less than 140 mg/dL      Peak postprandial:   less than 180 mg/dL (1-2 hours)      Critically ill patients:  140 - 180 mg/dL   Results for Emily Collins, Emily Collins (MRN [id number]) as of 11/30/2020 07:52  Ref. Range 11/29/2020 19:29 11/29/2020 20:02 11/29/2020 20:30 11/29/2020 21:04 11/29/2020 21:44 11/30/2020 00:24 11/30/2020 03:58  Glucose-Capillary Latest Ref Range: 70 - 99 mg/dL 47 (L)  50 ml D50% 148 (H) 122 (H) 103 (H) 84 127 (H) 166 (H)   Results for Emily Collins, Emily Collins (MRN [id number]) as of 11/30/2020 12:53  Ref. Range 11/30/2020 08:03 11/30/2020 12:36  Glucose-Capillary Latest Ref Range: 70 - 99 mg/dL 277 (H)  3 units NOVOLOG  30 units LANTUS 449 (H)   Results for Emily Collins, Emily Collins (MRN [id number]) as of 11/30/2020 07:52  Ref. Range 11/29/2020 22:58  Hemoglobin A1C Latest Ref Range: 4.8 - 5.6 % 10.2 (H)  (246 mg/dl)    Admit with: Complicated soft tissue infection of right leg in back ground of infected diabetic ulcer  History: DM, CKD  Home DM Meds: Humalog Inpen       InPen settings include:Target blood glucose 100, Sensitivity 30, I:C ratio 10       Levemir 60 units AM/ 40 units PM       Trulicity 69m Qweek  Current Orders: Lantus 30 units Daily      Novolog 0-6 units Q4 hours   Note Lantus to start this AM  Patient has just seen her Endocrinologist (see below for office visit recommendations to pt)  MD- Note CBG 449 at 12pm today.  Please consider:  1. Increase Lantus to 30 units BID  2. Increase Novolog SSI to the 0-15 unit Moderate scale  3. Start Novolog Meal Coverage: Novolog 6 units TID with meals Hold if pt eats <50% of meal, Hold if pt NPO     Endocrinologist: Dr. ALucilla Lamewith [facility]--last seen 10/27/2020--A1c at that visit was 11.2%--Was advised to do the following: -Send InPen reports to [facility] the morning, prior to coming  in on day of follow up appts.  - Encouraged her to log into [employer] se we can consistently get DexCom data at appts.  - Reviewed target sugars and target A1c.  - Adjust dose of Levemir to 40 units in the morning and 60 units in the evening.  - Reminded her of importance of taking Humalog before eating, not after. Advised her that her snacks count. She should take Humalog for every crumb of carbohydrates, whether she wants to classify them as meals or snacks, they all count. She should use the InPen appt and consistently take Humalog based on carb counting and blood sugar, based on calculation on the app. - Continue Trulicity. Adjust dose to 3 mg weekly    Addendum 10:25am--Met w/ pt at bedside this AM.  Reviewed current A1c of 10.2% which is 1% down from last A1c of 11.2% back in February at ENDO office visit.  Pt has all meds at home and has Dexcom CGM for glucose monitoring at home.  Had to take Dexcom off for MRI but plans to replace when she goes home.  We reviewed her home meds and I reminded pt that she should remember to take her Humalog with all meals and snacks as ordered by her ENDO.  Pt also stated she  did increase her Trulicity as recommended by her ENDO at last visit.  We discussed her HYPO event on admission--Pt told me she took insulin earlier in the AM prior to coming to the ED and that she attributes the HYPO event to missing her meal and not being able to eat in the ED.  No issues with HYPO at home per pt report.  Pt appreciative of visit and did not have questions for me at this time.    --Will follow patient during hospitalization--  Wyn Quaker RN, MSN, CDE Diabetes Coordinator Inpatient Glycemic Control Team Team Pager: [phone] (8a-5p)

## 2020-11-30 NOTE — Consult Note (Signed)
PODIATRY CONSULTATION  NAME Emily Collins MRN [id number] DOB 1966/09/06 DOA 11/29/2020   Reason for consult: Diabetic ulcer/foot infection Chief Complaint  Patient presents with  . Leg Pain    Consulting physician: Lurline Del, MD  History of present illness: 55 y.o. female with medical history significant of GERD,OA,Asthma,HLD, HTN, Depression/Anxiety,Type I DM complicated by PVD,retinopathy and peripheral neuropathy, CKDIII,who presents to ED with interim history of infected right heal ulcer x 2 weeks. Patient has been treated with antibiotics but despite this noted progression of symptoms. She now presents due to increasing pain, redness and swelling of her right leg. She denies ,fever/chills/ n/v/d/or abdominal pain. She notes no sob , cough or uri symptoms.  MRI was ordered right foot and ankle and podiatry consulted.  She presents today resting comfortably in bed with her husband present.  Past Medical History:  Diagnosis Date  . Allergy   . Anxiety   . Arthritis   . Asthma   . Depression   . Diabetes mellitus without complication (HCC)   . GERD (gastroesophageal reflux disease)   . History of chicken pox 04/07/2015   DID have Chicken Pox.    . Hyperlipidemia   . Hypertension   . Neuromuscular disorder (HCC)   . Venous (peripheral) insufficiency     CBC Latest Ref Rng & Units 11/30/2020 11/29/2020 11/29/2020  WBC 4.0 - 10.5 K/uL 14.7(H) 16.1(H) 15.6(H)  Hemoglobin 12.0 - 15.0 g/dL 8.2(L) 8.5(L) 8.6(L)  Hematocrit 36.0 - 46.0 % 26.1(L) 27.5(L) 27.0(L)  Platelets 150 - 400 K/uL 517(H) 548(H) 520(H)    BMP Latest Ref Rng & Units 11/30/2020 11/29/2020 11/29/2020  Glucose 70 - 99 mg/dL 423(N) 94 361(W)  BUN 6 - 20 mg/dL 43(X) 54(M) 08(Q)  Creatinine 0.44 - 1.00 mg/dL 7.61(P) 5.09(T) 2.67(T)  BUN/Creat Ratio 9 - 23 - - -  Sodium 135 - 145 mmol/L 135 134(L) 133(L)  Potassium 3.5 - 5.1 mmol/L 5.2(H) 5.0 5.5(H)  Chloride 98 - 111 mmol/L 106 105 104  CO2 22 - 32 mmol/L  20(L) 21(L) 21(L)  Calcium 8.9 - 10.3 mg/dL 8.2(L) 8.3(L) 8.5(L)          Physical Exam: General: The patient is alert and oriented x3 in no acute distress.   Dermatology: Large fissure of the skin noted to the posterior heel with periwound callus.  Wound appears stable with minimal drainage noted.  Fracture blister noted right anterior ankle approximately 1 cm in diameter.  This appears stable.  The skin is very tight to the anterior portion of the ankle with associated tenderness to palpation which correlates to the MRI findings  Vascular: Heavy edema with erythema noted right foot and ankle, most pronounced to the anterior portion of the right ankle.  Neurological: Epicritic and protective threshold diminished bilaterally.   Musculoskeletal Exam: No structural deformity noted.  Significant tenderness to palpation right anterior ankle  MRI impression 11/29/2020 RT ankle: 1. Small superficial soft tissue ulceration overlies the posteromedial aspect of the calcaneus with diffuse ankle edema/cellulitis. No underlying sinus tract or fluid collection. Negative for acute osteomyelitis. 2. Partially rim-enhancing fluid collection at the anterior aspect of the ankle surrounding the extensor tendons in total measuring approximately 5.9 x 2.2 x 3.7 cm. Findings suspicious for phlegmon/developing abscess with associated extensor tenosynovitis. 3. Tendinosis with short segment longitudinal split tear of the infra-malleolar aspect of the peroneus brevis tendon.    ASSESSMENT/PLAN OF CARE 1.  Diabetic foot ulcer right posterior heel with fat layer exposed  2.  Possible developing abscess right anterior ankle -Continue IV antibiotics as per admitting physician/hospitalist -Discussed with the patient and husband that it may be in her best interest to perform incision and drainage of the anterior portion of the ankle as well as debridement of the ulcer on the posterior heel.  Findings on the  MRI suspicious for possible abscess warrant incision and drainage of the anterior portion of the ankle and have cultures taken to help guide with appropriate antibiotic therapy.  Draining this abscess should also provide significant relief for the patient -We will tentatively plan for surgery tomorrow, 12/01/2020, depending on OR availability either lunchtime or after 5 PM.  Surgery will be performed by Dr. Sharl Ma, DPM.  -Preoperative orders will be placed n.p.o. after midnight tonight -Podiatry will continue to follow    Thank you for the consult.  Please contact me directly with any questions or concerns.  Cell [phone]   [name], DPM [facility]  Dr. [name], DPM    [address] [place], [place] [address]                Office [phone]  Fax [phone]

## 2020-12-01 ENCOUNTER — Inpatient Hospital Stay: Payer: Medicaid Other | Admitting: Anesthesiology

## 2020-12-01 ENCOUNTER — Encounter
Admission: EM | Disposition: A | Discharge status: Home / Self Care | Payer: Self-pay | Source: Home / Self Care | Attending: Internal Medicine

## 2020-12-01 DIAGNOSIS — N179 Acute kidney failure, unspecified: Secondary | ICD-10-CM

## 2020-12-01 DIAGNOSIS — E875 Hyperkalemia: Secondary | ICD-10-CM | POA: Diagnosis not present

## 2020-12-01 DIAGNOSIS — E1165 Type 2 diabetes mellitus with hyperglycemia: Secondary | ICD-10-CM

## 2020-12-01 DIAGNOSIS — L97412 Non-pressure chronic ulcer of right heel and midfoot with fat layer exposed: Secondary | ICD-10-CM

## 2020-12-01 DIAGNOSIS — M79604 Pain in right leg: Secondary | ICD-10-CM

## 2020-12-01 DIAGNOSIS — L02415 Cutaneous abscess of right lower limb: Secondary | ICD-10-CM

## 2020-12-01 DIAGNOSIS — L03115 Cellulitis of right lower limb: Secondary | ICD-10-CM | POA: Diagnosis not present

## 2020-12-01 HISTORY — PX: INCISION AND DRAINAGE: SHX5863

## 2020-12-01 LAB — GLUCOSE, CAPILLARY
Glucose-Capillary: 118 mg/dL — ABNORMAL HIGH (ref 70–99)
Glucose-Capillary: 138 mg/dL — ABNORMAL HIGH (ref 70–99)
Glucose-Capillary: 159 mg/dL — ABNORMAL HIGH (ref 70–99)
Glucose-Capillary: 161 mg/dL — ABNORMAL HIGH (ref 70–99)
Glucose-Capillary: 202 mg/dL — ABNORMAL HIGH (ref 70–99)
Glucose-Capillary: 258 mg/dL — ABNORMAL HIGH (ref 70–99)
Glucose-Capillary: 38 mg/dL — CL (ref 70–99)
Glucose-Capillary: 95 mg/dL (ref 70–99)

## 2020-12-01 LAB — CBC WITH DIFFERENTIAL/PLATELET
Abs Immature Granulocytes: 0.17 10*3/uL — ABNORMAL HIGH (ref 0.00–0.07)
Basophils Absolute: 0.1 10*3/uL (ref 0.0–0.1)
Basophils Relative: 1 %
Eosinophils Absolute: 1 10*3/uL — ABNORMAL HIGH (ref 0.0–0.5)
Eosinophils Relative: 6 %
HCT: 24.9 % — ABNORMAL LOW (ref 36.0–46.0)
Hemoglobin: 7.6 g/dL — ABNORMAL LOW (ref 12.0–15.0)
Immature Granulocytes: 1 %
Lymphocytes Relative: 16 %
Lymphs Abs: 2.5 10*3/uL (ref 0.7–4.0)
MCH: 26.7 pg (ref 26.0–34.0)
MCHC: 30.5 g/dL (ref 30.0–36.0)
MCV: 87.4 fL (ref 80.0–100.0)
Monocytes Absolute: 0.9 10*3/uL (ref 0.1–1.0)
Monocytes Relative: 5 %
Neutro Abs: 11.2 10*3/uL — ABNORMAL HIGH (ref 1.7–7.7)
Neutrophils Relative %: 71 %
Platelets: 558 10*3/uL — ABNORMAL HIGH (ref 150–400)
RBC: 2.85 MIL/uL — ABNORMAL LOW (ref 3.87–5.11)
RDW: 16 % — ABNORMAL HIGH (ref 11.5–15.5)
WBC: 15.9 10*3/uL — ABNORMAL HIGH (ref 4.0–10.5)
nRBC: 0 % (ref 0.0–0.2)

## 2020-12-01 LAB — POCT I-STAT, CHEM 8
BUN: 30 mg/dL — ABNORMAL HIGH (ref 6–20)
Calcium, Ion: 1.12 mmol/L — ABNORMAL LOW (ref 1.15–1.40)
Chloride: 103 mmol/L (ref 98–111)
Creatinine, Ser: 1.7 mg/dL — ABNORMAL HIGH (ref 0.44–1.00)
Glucose, Bld: 59 mg/dL — ABNORMAL LOW (ref 70–99)
HCT: 30 % — ABNORMAL LOW (ref 36.0–46.0)
Hemoglobin: 10.2 g/dL — ABNORMAL LOW (ref 12.0–15.0)
Potassium: 5.2 mmol/L — ABNORMAL HIGH (ref 3.5–5.1)
Sodium: 135 mmol/L (ref 135–145)
TCO2: 22 mmol/L (ref 22–32)

## 2020-12-01 LAB — BASIC METABOLIC PANEL
Anion gap: 7 (ref 5–15)
BUN: 34 mg/dL — ABNORMAL HIGH (ref 6–20)
CO2: 21 mmol/L — ABNORMAL LOW (ref 22–32)
Calcium: 8.2 mg/dL — ABNORMAL LOW (ref 8.9–10.3)
Chloride: 105 mmol/L (ref 98–111)
Creatinine, Ser: 1.78 mg/dL — ABNORMAL HIGH (ref 0.44–1.00)
GFR, Estimated: 33 mL/min — ABNORMAL LOW (ref >60)
Glucose, Bld: 198 mg/dL — ABNORMAL HIGH (ref 70–99)
Potassium: 5.4 mmol/L — ABNORMAL HIGH (ref 3.5–5.1)
Sodium: 133 mmol/L — ABNORMAL LOW (ref 135–145)

## 2020-12-01 LAB — PREPARE RBC (CROSSMATCH)

## 2020-12-01 LAB — ABO/RH: ABO/RH(D): A NEG

## 2020-12-01 SURGERY — INCISION AND DRAINAGE
Anesthesia: General | Laterality: Right

## 2020-12-01 MED ORDER — PROPOFOL 10 MG/ML IV BOLUS
INTRAVENOUS | Status: DC | PRN
  Administered 2020-12-01: 170 mg via INTRAVENOUS

## 2020-12-01 MED ORDER — HYDROCODONE-ACETAMINOPHEN 10-325 MG PO TABS
1.0000 | ORAL_TABLET | Freq: Four times a day (QID) | ORAL | Status: DC | PRN
  Administered 2020-12-01: 2 via ORAL
  Administered 2020-12-02: 1 via ORAL
  Filled 2020-12-01: qty 1
  Filled 2020-12-01: qty 2

## 2020-12-01 MED ORDER — FENTANYL CITRATE (PF) 100 MCG/2ML IJ SOLN
INTRAMUSCULAR | Status: AC
  Filled 2020-12-01: qty 2

## 2020-12-01 MED ORDER — IPRATROPIUM-ALBUTEROL 0.5-2.5 (3) MG/3ML IN SOLN: 3.0000 mL | RESPIRATORY_TRACT | Status: DC

## 2020-12-01 MED ORDER — IPRATROPIUM-ALBUTEROL 0.5-2.5 (3) MG/3ML IN SOLN
3.0000 mL | RESPIRATORY_TRACT | Status: AC
  Administered 2020-12-01: 3 mL via RESPIRATORY_TRACT

## 2020-12-01 MED ORDER — FENTANYL CITRATE (PF) 100 MCG/2ML IJ SOLN
INTRAMUSCULAR | Status: AC
  Administered 2020-12-01: 25 ug via INTRAVENOUS
  Filled 2020-12-01: qty 2

## 2020-12-01 MED ORDER — FENTANYL CITRATE (PF) 100 MCG/2ML IJ SOLN
25.0000 ug | INTRAMUSCULAR | Status: DC | PRN
  Administered 2020-12-01 (×2): 25 ug via INTRAVENOUS

## 2020-12-01 MED ORDER — IPRATROPIUM-ALBUTEROL 0.5-2.5 (3) MG/3ML IN SOLN
RESPIRATORY_TRACT | Status: AC
  Filled 2020-12-01: qty 3

## 2020-12-01 MED ORDER — LIDOCAINE HCL (PF) 1 % IJ SOLN
INTRAMUSCULAR | Status: AC
  Filled 2020-12-01: qty 30

## 2020-12-01 MED ORDER — BUPIVACAINE HCL (PF) 0.5 % IJ SOLN
INTRAMUSCULAR | Status: DC | PRN
  Administered 2020-12-01: 20 mL

## 2020-12-01 MED ORDER — ONDANSETRON HCL 4 MG/2ML IJ SOLN
INTRAMUSCULAR | Status: DC | PRN
  Administered 2020-12-01: 4 mg via INTRAVENOUS

## 2020-12-01 MED ORDER — MIDAZOLAM HCL 2 MG/2ML IJ SOLN
INTRAMUSCULAR | Status: DC | PRN
  Administered 2020-12-01: 2 mg via INTRAVENOUS

## 2020-12-01 MED ORDER — DEXAMETHASONE SODIUM PHOSPHATE 10 MG/ML IJ SOLN
INTRAMUSCULAR | Status: DC | PRN
  Administered 2020-12-01: 5 mg via INTRAVENOUS

## 2020-12-01 MED ORDER — FENTANYL CITRATE (PF) 100 MCG/2ML IJ SOLN
INTRAMUSCULAR | Status: DC | PRN
  Administered 2020-12-01: 25 ug via INTRAVENOUS
  Administered 2020-12-01: 50 ug via INTRAVENOUS
  Administered 2020-12-01: 25 ug via INTRAVENOUS

## 2020-12-01 MED ORDER — DEXTROSE 50 % IV SOLN
INTRAVENOUS | Status: AC
  Administered 2020-12-01: 25 mL
  Filled 2020-12-01: qty 50

## 2020-12-01 MED ORDER — SODIUM CHLORIDE 0.9% IV SOLUTION: Freq: Once | INTRAVENOUS | Status: AC

## 2020-12-01 MED ORDER — PHENYLEPHRINE HCL (PRESSORS) 10 MG/ML IV SOLN
INTRAVENOUS | Status: DC | PRN
  Administered 2020-12-01: 100 ug via INTRAVENOUS

## 2020-12-01 MED ORDER — DEXTROSE-NACL 5-0.9 % IV SOLN: INTRAVENOUS | Status: DC

## 2020-12-01 MED ORDER — DEXMEDETOMIDINE (PRECEDEX) IN NS 20 MCG/5ML (4 MCG/ML) IV SYRINGE
PREFILLED_SYRINGE | INTRAVENOUS | Status: DC | PRN
  Administered 2020-12-01 (×2): 8 ug via INTRAVENOUS

## 2020-12-01 MED ORDER — GENTAMICIN SULFATE 40 MG/ML IJ SOLN
INTRAMUSCULAR | Status: AC
  Filled 2020-12-01: qty 2

## 2020-12-01 MED ORDER — BUPIVACAINE HCL (PF) 0.5 % IJ SOLN
INTRAMUSCULAR | Status: AC
  Filled 2020-12-01: qty 30

## 2020-12-01 MED ORDER — INSULIN GLARGINE 100 UNIT/ML ~~LOC~~ SOLN
30.0000 [IU] | Freq: Every day | SUBCUTANEOUS | Status: DC
  Administered 2020-12-01 – 2020-12-06 (×3): 30 [IU] via SUBCUTANEOUS
  Filled 2020-12-01 (×7): qty 0.3

## 2020-12-01 MED ORDER — ONDANSETRON HCL 4 MG/2ML IJ SOLN: 4.0000 mg | Freq: Once | INTRAMUSCULAR | Status: DC | PRN

## 2020-12-01 MED ORDER — LIDOCAINE HCL (CARDIAC) PF 100 MG/5ML IV SOSY
PREFILLED_SYRINGE | INTRAVENOUS | Status: DC | PRN
  Administered 2020-12-01: 80 mg via INTRAVENOUS

## 2020-12-01 MED ORDER — SODIUM CHLORIDE 0.9 % IV SOLN: INTRAVENOUS | Status: DC

## 2020-12-01 MED ORDER — GENTAMICIN SULFATE 40 MG/ML IJ SOLN
INTRAMUSCULAR | Status: DC | PRN
  Administered 2020-12-01: 80 mg

## 2020-12-01 MED ORDER — SODIUM ZIRCONIUM CYCLOSILICATE 10 G PO PACK
10.0000 g | PACK | Freq: Once | ORAL | Status: AC
  Administered 2020-12-01: 10 g via ORAL
  Filled 2020-12-01: qty 1

## 2020-12-01 MED ORDER — MIDAZOLAM HCL 2 MG/2ML IJ SOLN
INTRAMUSCULAR | Status: AC
  Filled 2020-12-01: qty 2

## 2020-12-01 SURGICAL SUPPLY — 70 items
BAG COUNTER SPONGE EZ (MISCELLANEOUS) ×2 IMPLANT
BAG SPNG 4X4 CLR HAZ (MISCELLANEOUS) ×1
BLADE OSC/SAGITTAL MD 5.5X18 (BLADE) IMPLANT
BLADE OSCILLATING/SAGITTAL (BLADE)
BLADE SW THK.38XMED LNG THN (BLADE) IMPLANT
BNDG CMPR STD VLCR NS LF 5.8X3 (GAUZE/BANDAGES/DRESSINGS)
BNDG CMPR STD VLCR NS LF 5.8X4 (GAUZE/BANDAGES/DRESSINGS)
BNDG CONFORM 2 STRL LF (GAUZE/BANDAGES/DRESSINGS) IMPLANT
BNDG CONFORM 3 STRL LF (GAUZE/BANDAGES/DRESSINGS) IMPLANT
BNDG ELASTIC 3X5.8 VLCR NS LF (GAUZE/BANDAGES/DRESSINGS) IMPLANT
BNDG ELASTIC 4X5.8 VLCR NS LF (GAUZE/BANDAGES/DRESSINGS) IMPLANT
BNDG ESMARK 4X12 TAN STRL LF (GAUZE/BANDAGES/DRESSINGS) ×2 IMPLANT
BNDG GAUZE 4.5X4.1 6PLY STRL (MISCELLANEOUS) ×2 IMPLANT
CANISTER SUCT 1200ML W/VALVE (MISCELLANEOUS) ×2 IMPLANT
CANISTER SUCT 3000ML PPV (MISCELLANEOUS) IMPLANT
COVER WAND RF STERILE (DRAPES) ×2 IMPLANT
CUFF TOURN SGL QUICK 12 (TOURNIQUET CUFF) IMPLANT
CUFF TOURN SGL QUICK 18X4 (TOURNIQUET CUFF) IMPLANT
CUFF TOURN SGL QUICK 34 (TOURNIQUET CUFF)
CUFF TOURN SGL QUICK 42 (TOURNIQUET CUFF) ×1 IMPLANT
CUFF TRNQT CYL 34X4X40X1 (TOURNIQUET CUFF) IMPLANT
DRAPE FLUOR MINI C-ARM 54X84 (DRAPES) IMPLANT
DURAPREP 26ML APPLICATOR (WOUND CARE) ×2 IMPLANT
ELECT REM PT RETURN 9FT ADLT (ELECTROSURGICAL) ×2 IMPLANT
ELECTRODE REM PT RTRN 9FT ADLT (ELECTROSURGICAL) ×1 IMPLANT
GAUZE PACKING 1/4 X5 YD (GAUZE/BANDAGES/DRESSINGS) IMPLANT
GAUZE PACKING IODOFORM 1X5 (PACKING) ×1 IMPLANT
GAUZE SPONGE 4X4 12PLY STRL (GAUZE/BANDAGES/DRESSINGS) ×2 IMPLANT
GAUZE XEROFORM 1X8 LF (GAUZE/BANDAGES/DRESSINGS) ×2 IMPLANT
GLOVE SURG ENC MOIS LTX SZ7 (GLOVE) ×2 IMPLANT
GLOVE SURG UNDER LTX SZ7 (GLOVE) ×2 IMPLANT
GOWN STRL REUS W/ TWL LRG LVL3 (GOWN DISPOSABLE) ×2 IMPLANT
GOWN STRL REUS W/TWL LRG LVL3 (GOWN DISPOSABLE) ×4
HANDLE YANKAUER SUCT BULB TIP (MISCELLANEOUS) ×1 IMPLANT
HANDPIECE VERSAJET DEBRIDEMENT (MISCELLANEOUS) IMPLANT
IV NS 1000ML (IV SOLUTION)
IV NS 1000ML BAXH (IV SOLUTION) IMPLANT
KIT TURNOVER KIT A (KITS) ×2 IMPLANT
LABEL OR SOLS (LABEL) IMPLANT
MANIFOLD NEPTUNE II (INSTRUMENTS) ×2 IMPLANT
NDL FILTER BLUNT 18X1 1/2 (NEEDLE) ×1 IMPLANT
NDL HYPO 25X1 1.5 SAFETY (NEEDLE) ×2 IMPLANT
NEEDLE FILTER BLUNT 18X 1/2SAF (NEEDLE) ×1
NEEDLE FILTER BLUNT 18X1 1/2 (NEEDLE) ×1 IMPLANT
NEEDLE HYPO 25X1 1.5 SAFETY (NEEDLE) ×4 IMPLANT
NS IRRIG 500ML POUR BTL (IV SOLUTION) ×2 IMPLANT
PACK EXTREMITY ARMC (MISCELLANEOUS) ×2 IMPLANT
PAD ABD DERMACEA PRESS 5X9 (GAUZE/BANDAGES/DRESSINGS) ×2 IMPLANT
PULSAVAC PLUS IRRIG FAN TIP (DISPOSABLE) IMPLANT
RASP SM TEAR CROSS CUT (RASP) IMPLANT
SOL .9 NS 3000ML IRR  AL (IV SOLUTION)
SOL .9 NS 3000ML IRR AL (IV SOLUTION)
SOL .9 NS 3000ML IRR UROMATIC (IV SOLUTION) IMPLANT
SOL PREP PVP 2OZ (MISCELLANEOUS) IMPLANT
SOLUTION PREP PVP 2OZ (MISCELLANEOUS) IMPLANT
STOCKINETTE STRL 6IN 960660 (GAUZE/BANDAGES/DRESSINGS) ×2 IMPLANT
SUT ETHILON 2 0  PS2 NEEDLE (SUTURE) ×4
SUT ETHILON 2 0 PS2 NDL (SUTURE) IMPLANT
SUT ETHILON 2 0 PS2 NEEDLE (SUTURE) ×2 IMPLANT
SUT ETHILON 3-0 FS-10 30 BLK (SUTURE) IMPLANT
SUT ETHILON 4-0 (SUTURE)
SUT ETHILON 4-0 FS2 18XMFL BLK (SUTURE) IMPLANT
SUT VIC AB 3-0 SH 27 (SUTURE)
SUT VIC AB 3-0 SH 27X BRD (SUTURE) IMPLANT
SUT VIC AB 4-0 FS2 27 (SUTURE) IMPLANT
SUTURE EHLN 3-0 FS-10 30 BLK (SUTURE) IMPLANT
SUTURE ETHLN 4-0 FS2 18XMF BLK (SUTURE) IMPLANT
SWAB CULTURE AMIES ANAERIB BLU (MISCELLANEOUS) IMPLANT
SYR 10ML LL (SYRINGE) ×2 IMPLANT
TIP FAN IRRIG PULSAVAC PLUS (DISPOSABLE) IMPLANT

## 2020-12-01 NOTE — Brief Op Note (Signed)
12/01/2020  7:30 PM  PATIENT:  Emily Collins  55 y.o. female  PRE-OPERATIVE DIAGNOSIS:  abscess leg and ankle  POST-OPERATIVE DIAGNOSIS:  abscess leg and ankle  PROCEDURE:  Procedure(s): INCISION AND DRAINAGE (Right)  SURGEON:  Surgeon(s) and Role:    * Malikah Lakey, Rachelle Hora, DPM - Primary    ASSISTANTS: none   ANESTHESIA:   general  EBL:  5 mL   BLOOD ADMINISTERED:none  DRAINS: none   LOCAL MEDICATIONS USED:  MARCAINE   20 cc  SPECIMEN: Deep wound culture  DISPOSITION OF SPECIMEN:  Microbiology  COUNTS:  YES  TOURNIQUET:   Total Tourniquet Time Documented: Thigh (Right) - 19 minutes Total: Thigh (Right) - 19 minutes   DICTATION: .Note written in EPIC  PLAN OF CARE: Admit to inpatient   PATIENT DISPOSITION:  PACU - hemodynamically stable.   Delay start of Pharmacological VTE agent (>24hrs) due to surgical blood loss or risk of bleeding: yes

## 2020-12-01 NOTE — Progress Notes (Addendum)
PROGRESS NOTE    Emily Collins  MHD:[id number] DOB: [birth year] DOA: 11/29/2020 PCP: Oswaldo Conroy, MD   Assessment & Plan:   Active Problems:   Cellulitis  Right leg/foot cellulitis with right diabetic foot wound infection: will go for I&D today as per podiatry. MRI concerning for developing abscess with associated extensor tenosynovitis.  Continue on IV flagyl, vanco & ceftriaxone   AKI on CKDIIIa:continue on to hold lasix, lisinopril. Continue on IVFs   Hyperkalemia: lokelma x1. Will continue to monitor   DM2: poorly controlled w/ HbA1c 10.2. Continue on lantus, SSI w/ accuchecks    Depression: severity unknown. Continue on home dose fluoxetine  Morbid obesity: BMI 41.8. Complicates overall care and prognosis   Thrombocytosis: likely reactive. Will continue to monitor   Leukocytosis: likely secondary to infection. Continue on IV abxs   Likely ACD: H&H are trending down. Will transfuse 1 unit of pRBCs today  Hyponatremia: labile. Will continue to monitor   DVT prophylaxis: lovenox  Code Status: full  Family Communication:  Disposition Plan:  Depends on PT/OT recs   Level of care: Med-Surg   Status is: Inpatient  Remains inpatient appropriate because:Ongoing diagnostic testing needed not appropriate for outpatient work up, Unsafe d/c plan, IV treatments appropriate due to intensity of illness or inability to take PO and Inpatient level of care appropriate due to severity of illness   Dispo: The patient is from: Home              Anticipated d/c is to: Home              Patient currently is not medically stable to d/c.   Difficult to place patient Yes       Consultants:      Procedures:    Antimicrobials: vanco, flagyl, ceftriaxone   Subjective: Pt c/o right foot pain  Objective: Vitals:   11/30/20 1955 12/01/20 0045 12/01/20 0425 12/01/20 0750  BP: (!) 102/51 (!) 107/50 (!) 112/52 (!) 153/61  Pulse: 81 78 83 92  Resp: 18 18  16    Temp: 98.4 F (36.9 C) 98.1 F (36.7 C) 97.9 F (36.6 C) 98.1 F (36.7 C)  TempSrc:      SpO2: 97% 95% 96% 97%  Weight:      Height:        Intake/Output Summary (Last 24 hours) at 12/01/2020 0755 Last data filed at 12/01/2020 0454 Gross per 24 hour  Intake 956.97 ml  Output --  Net 956.97 ml   Filed Weights   11/29/20 1301 11/30/20 0353  Weight: 104 kg 103.8 kg    Examination:  General exam: Appears calm and comfortable  Respiratory system: Clear to auscultation. Respiratory effort normal. Cardiovascular system: S1 & S2+. No rubs, gallops or clicks.  Gastrointestinal system: Abdomen is nondistended, soft and nontender. Normal bowel sounds heard. Central nervous system: Alert and oriented. Moves all extremities  Psychiatry: Judgement and insight appear normal. Mood & affect appropriate.     Data Reviewed: I have personally reviewed following labs and imaging studies  CBC: Recent Labs  Lab 11/29/20 1308 11/29/20 2258 11/30/20 0432 12/01/20 0509  WBC 15.6* 16.1* 14.7* 15.9*  NEUTROABS 12.0*  --   --  11.2*  HGB 8.6* 8.5* 8.2* 7.6*  HCT 27.0* 27.5* 26.1* 24.9*  MCV 85.2 87.3 86.7 87.4  PLT 520* 548* 517* 558*   Basic Metabolic Panel: Recent Labs  Lab 11/29/20 1308 11/29/20 2258 11/30/20 0432 12/01/20 0509  NA 133* 134* 135 133*  K 5.5* 5.0 5.2* 5.4*  CL 104 105 106 105  CO2 21* 21* 20* 21*  GLUCOSE 168* 94 176* 198*  BUN 23* 26* 27* 34*  CREATININE 1.40* 1.65* 1.73* 1.78*  CALCIUM 8.5* 8.3* 8.2* 8.2*  MG  --  2.6*  --   --   PHOS  --  4.4  --   --    GFR: Estimated Creatinine Clearance: 40.4 mL/min (A) (by C-G formula based on SCr of 1.78 mg/dL (H)). Liver Function Tests: Recent Labs  Lab 11/29/20 1308 11/30/20 0432  AST 28 21  ALT 24 21  ALKPHOS 117 105  BILITOT 0.4 0.6  PROT 8.1 7.5  ALBUMIN 3.1* 2.8*   No results for input(s): LIPASE, AMYLASE in the last 168 hours. No results for input(s): AMMONIA in the last 168 hours. Coagulation  Profile: No results for input(s): INR, PROTIME in the last 168 hours. Cardiac Enzymes: No results for input(s): CKTOTAL, CKMB, CKMBINDEX, TROPONINI in the last 168 hours. BNP (last 3 results) No results for input(s): PROBNP in the last 8760 hours. HbA1C: Recent Labs    11/29/20 2258  HGBA1C 10.2*   CBG: Recent Labs  Lab 11/30/20 1236 11/30/20 1638 11/30/20 2059 12/01/20 0047 12/01/20 0421  GLUCAP 449* 256* 68* 118* 161*   Lipid Profile: No results for input(s): CHOL, HDL, LDLCALC, TRIG, CHOLHDL, LDLDIRECT in the last 72 hours. Thyroid Function Tests: Recent Labs    11/29/20 2258  TSH 4.501*   Anemia Panel: No results for input(s): VITAMINB12, FOLATE, FERRITIN, TIBC, IRON, RETICCTPCT in the last 72 hours. Sepsis Labs: Recent Labs  Lab 11/29/20 1308  LATICACIDVEN 1.2    Recent Results (from the past 240 hour(s))  SARS CORONAVIRUS 2 (TAT 6-24 HRS) Nasopharyngeal Nasopharyngeal Swab     Status: None   Collection Time: 11/29/20  4:20 PM   Specimen: Nasopharyngeal Swab  Result Value Ref Range Status   SARS Coronavirus 2 NEGATIVE NEGATIVE Final    Comment: (NOTE) SARS-CoV-2 target nucleic acids are NOT DETECTED.  The SARS-CoV-2 RNA is generally detectable in upper and lower respiratory specimens during the acute phase of infection. Negative results do not preclude SARS-CoV-2 infection, do not rule out co-infections with other pathogens, and should not be used as the sole basis for treatment or other patient management decisions. Negative results must be combined with clinical observations, patient history, and epidemiological information. The expected result is Negative.  Fact Sheet for Patients: HairSlick.no  Fact Sheet for Healthcare Providers: quierodirigir.com  This test is not yet approved or cleared by the [place] FDA and  has been authorized for detection and/or diagnosis of SARS-CoV-2 by FDA  under an Emergency Use Authorization (EUA). This EUA will remain  in effect (meaning this test can be used) for the duration of the COVID-19 declaration under Se ction 564(b)(1) of the Act, 21 U.S.C. section 360bbb-3(b)(1), unless the authorization is terminated or revoked sooner.  Performed at [facility], [address], [place], [place] [address]          Radiology Studies: MR ANKLE RIGHT W WO CONTRAST  Result Date: 11/30/2020 CLINICAL DATA:  Posterior calcaneal diabetic ulcer EXAM: MRI OF THE RIGHT ANKLE WITHOUT AND WITH CONTRAST TECHNIQUE: Multiplanar, multisequence MR imaging of the ankle was performed before and after the administration of intravenous contrast. CONTRAST:  64mL GADAVIST GADOBUTROL 1 MMOL/ML IV SOLN COMPARISON:  X-ray 11/29/2020 FINDINGS: TENDONS Peroneal: Tendinosis with short segment longitudinal split tear of the infra-malleolar aspect of the  peroneus brevis tendon. Intact peroneus longus. Trace tenosynovial fluid. Posteromedial: Intact tibialis posterior, flexor hallucis longus and flexor digitorum longus tendons. Anterior: Intact tibialis anterior, extensor hallucis longus and extensor digitorum longus tendons. Tenosynovial fluid associated with the anterior ankle tendons at the level of the tibiotalar joint. Achilles: Intact. Plantar Fascia: Intact mild perifascial edema. LIGAMENTS Lateral: The anterior and posterior tibiofibular ligaments are intact. The anterior and posterior talofibular ligaments are intact. Intact calcaneofibular ligament. Medial: Deltoid ligament and spring ligament complex intact. CARTILAGE Ankle Joint: No cartilage defect. Small amount of fluid posterior to the tibiotalar joint. Subtalar Joints/Sinus Tarsi: No joint effusion or chondral defect. Preservation of the anatomic fat within the sinus tarsi. Bones: No acute fracture. No malalignment. No bony erosion or cortical destruction. No bone marrow edema. Preservation of the T1 fatty bone  marrow signal. Other: Small superficial soft tissue ulceration overlies the posteromedial aspect of the calcaneus. No underlying sinus tract or fluid collection. Circumferential subcutaneous edema. Somewhat ill-defined, partially peripherally enhancing fluid collection at the anterior aspect of the ankle surrounding the extensor tendons primarily located between the superior and inferior extensor retinacula measuring approximately 5.9 x 2.2 x 3.7 cm (series 11, image 7-18). Collection extends around the lateral aspect of the hindfoot anterior to the peroneal tendons. IMPRESSION: IMPRESSION 1. Small superficial soft tissue ulceration overlies the posteromedial aspect of the calcaneus with diffuse ankle edema/cellulitis. No underlying sinus tract or fluid collection. Negative for acute osteomyelitis. 2. Partially rim-enhancing fluid collection at the anterior aspect of the ankle surrounding the extensor tendons in total measuring approximately 5.9 x 2.2 x 3.7 cm. Findings suspicious for phlegmon/developing abscess with associated extensor tenosynovitis. 3. Tendinosis with short segment longitudinal split tear of the infra-malleolar aspect of the peroneus brevis tendon. Electronically Signed   By: Duanne Guess D.O.   On: 11/30/2020 08:21   US Venous Img Lower Unilateral Right  Result Date: 11/29/2020 CLINICAL DATA:  Pain and swelling. EXAM: RIGHT LOWER EXTREMITY VENOUS DOPPLER ULTRASOUND TECHNIQUE: Gray-scale sonography with compression, as well as color and duplex ultrasound, were performed to evaluate the deep venous system(s) from the level of the common femoral vein through the popliteal and proximal calf veins. COMPARISON:  None. FINDINGS: VENOUS Normal compressibility of the common femoral, superficial femoral, and popliteal veins, as well as the visualized calf veins. Visualized portions of profunda femoral vein and great saphenous vein unremarkable. No filling defects to suggest DVT on grayscale or color  Doppler imaging. Doppler waveforms show normal direction of venous flow, normal respiratory plasticity and response to augmentation. Limited views of the contralateral common femoral vein are unremarkable. Limitations: Limited evaluation of the right calf veins due to edema. IMPRESSION: No evidence of DVT with limited visualization of the calf veins due to edema. Electronically Signed   By: Feliberto Harts MD   On: 11/29/2020 17:03   DG Foot Complete Right  Result Date: 11/29/2020 CLINICAL DATA:  Right heel wound and cellulitis. EXAM: RIGHT FOOT COMPLETE - 3+ VIEW COMPARISON:  None. FINDINGS: There is a ulcer overlying the posterior calcaneus. There is no underlying radiographic evidence for osteomyelitis. There is a large plantar calcaneal spur. There are vascular calcifications. There is soft tissue swelling about the foot without evidence for an acute displaced fracture or dislocation. IMPRESSION: 1. Soft tissue ulcer overlying the posterior calcaneus without underlying radiographic evidence for osteomyelitis. 2. No acute displaced fracture or dislocation. 3. Vascular calcifications. 4. Soft tissue swelling about the foot. Electronically Signed   By: Katherine Mantle  M.D.   On: 11/29/2020 16:39        Scheduled Meds: . amitriptyline  25 mg Oral QHS  . aspirin EC  81 mg Oral Daily  . atorvastatin  40 mg Oral Daily  . calcium carbonate  1 tablet Oral BID WC  . docusate sodium  100 mg Oral BID  . enoxaparin (LOVENOX) injection  0.5 mg/kg Subcutaneous Q24H  . famotidine  40 mg Oral Daily  . FLUoxetine  40 mg Oral Daily  . insulin aspart  0-20 Units Subcutaneous TID WC  . insulin aspart  0-5 Units Subcutaneous QHS  . insulin glargine  40 Units Subcutaneous QHS  . insulin glargine  60 Units Subcutaneous Daily  . ipratropium  2 puff Inhalation Q4H  . loratadine  10 mg Oral Daily  . montelukast  10 mg Oral QHS  . pantoprazole  40 mg Oral Daily  . pregabalin  150 mg Oral Daily   Continuous  Infusions: . cefTRIAXone (ROCEPHIN)  IV Stopped (11/30/20 1542)  . lactated ringers Stopped (12/01/20 16100628)  . metronidazole 500 mg (12/01/20 0629)  . vancomycin Stopped (11/30/20 1449)     LOS: 2 days    Time spent: 33 mins     Charise KillianJamiese M Ayrton Mcvay, MD [facility] Pager [phone]  If 7PM-7AM, please contact night-coverage 12/01/2020, 7:55 AM

## 2020-12-01 NOTE — Anesthesia Procedure Notes (Signed)
Procedure Name: LMA Insertion Date/Time: 12/01/2020 6:38 PM Performed by: Jaye Beagle, CRNA Pre-anesthesia Checklist: Patient identified, Emergency Drugs available, Suction available and Patient being monitored Patient Re-evaluated:Patient Re-evaluated prior to induction Oxygen Delivery Method: Circle system utilized Preoxygenation: Pre-oxygenation with 100% oxygen Induction Type: IV induction Ventilation: Mask ventilation without difficulty LMA: LMA inserted LMA Size: 3.5 Number of attempts: 1 Tube secured with: Tape Dental Injury: Teeth and Oropharynx as per pre-operative assessment

## 2020-12-01 NOTE — Anesthesia Preprocedure Evaluation (Signed)
Anesthesia Evaluation  Patient identified by MRN, date of birth, ID band Patient awake    Reviewed: Allergy & Precautions, NPO status , Patient's Chart, lab work & pertinent test results  History of Anesthesia Complications Negative for: history of anesthetic complications  Airway Mallampati: III       Dental   Pulmonary asthma , neg sleep apnea, neg COPD, Not current smoker, former smoker,           Cardiovascular hypertension, Pt. on medications (-) Past MI and (-) CHF (-) dysrhythmias (-) Valvular Problems/Murmurs     Neuro/Psych neg Seizures Anxiety Depression    GI/Hepatic GERD  Medicated and Controlled,  Endo/Other  diabetes, Type 2, Oral Hypoglycemic Agents  Renal/GU Renal InsufficiencyRenal disease     Musculoskeletal   Abdominal   Peds  Hematology   Anesthesia Other Findings   Reproductive/Obstetrics                             Anesthesia Physical Anesthesia Plan  ASA: III and emergent  Anesthesia Plan: General   Post-op Pain Management:    Induction: Intravenous  PONV Risk Score and Plan: 3 and Ondansetron and Dexamethasone  Airway Management Planned: LMA and Oral ETT  Additional Equipment:   Intra-op Plan:   Post-operative Plan:   Informed Consent: I have reviewed the patients History and Physical, chart, labs and discussed the procedure including the risks, benefits and alternatives for the proposed anesthesia with the patient or authorized representative who has indicated his/her understanding and acceptance.       Plan Discussed with:   Anesthesia Plan Comments:         Anesthesia Quick Evaluation

## 2020-12-01 NOTE — Anesthesia Postprocedure Evaluation (Signed)
Anesthesia Post Note  Patient: Emily Collins  Procedure(s) Performed: INCISION AND DRAINAGE (Right )  Patient location during evaluation: PACU Anesthesia Type: General Level of consciousness: awake and alert Pain management: pain level controlled Vital Signs Assessment: post-procedure vital signs reviewed and stable Respiratory status: spontaneous breathing and respiratory function stable Cardiovascular status: stable Anesthetic complications: no   No complications documented.   Last Vitals:  Vitals:   12/01/20 2025 12/01/20 2030  BP:  135/72  Pulse: 77 76  Resp: 11 (!) 9  Temp:    SpO2: 96% 92%    Last Pain:  Vitals:   12/01/20 2025  TempSrc:   PainSc: 4                  Nataliya Graig K

## 2020-12-01 NOTE — Plan of Care (Signed)

## 2020-12-01 NOTE — Progress Notes (Signed)
History and Physical Interval Note:  12/01/2020 6:25 PM  Emily Collins  has presented today for surgery, with the diagnosis of infection right leg and foot.  The various methods of treatment have been discussed with the patient and family. After consideration of risks, benefits and other options for treatment, the patient has consented to   Procedure(s): INCISION AND DRAINAGE (Right) as a surgical intervention.  The patient's history has been reviewed, patient examined, no change in status, stable for surgery.  I have reviewed the patient's chart and labs.  Questions were answered to the patient's satisfaction.     Edwin Cap

## 2020-12-01 NOTE — Op Note (Signed)
Patient Name: Geniva Lohnes DOB: 03-May-1966  MRN: [id number]   Date of Service: 12/01/20  Surgeon: Dr. Sharl Ma, DPM Assistants: None Pre-operative Diagnosis:  #1 chronic ulcer right heel #2 abscess right anterior leg #3 abscess right dorsal foot Post-operative Diagnosis:  #1 chronic ulcer right heel #2 abscess right anterior leg #3 abscess right dorsal foot Procedures:  1) incision and drainage abscess leg  2) incision and drainage abscess foot  3) debridement of skin subcutaneous tissue and fascia right heel ulcer Pathology/Specimens: ID Type Source Tests Collected by Time Destination  A : Abscess Right Ankle Culture Wound Wound AEROBIC/ANAEROBIC CULTURE W GRAM STAIN (SURGICAL/DEEP WOUND) Edwin Cap, [facility] 12/01/2020 1855    Anesthesia: General Hemostasis:  Total Tourniquet Time Documented: Thigh (Right) - 19 minutes Total: Thigh (Right) - 19 minutes  Estimated Blood Loss: 5 mL Materials: * No implants in log * Medications: 20 cc half percent Marcaine plain Complications: None  Indications for Procedure:  This is a 55 y.o. female with a history of uncontrolled type 2 diabetes with a chronic right heel ulcer who was admitted to [facility] on 11/29/2020.  She developed cellulitis and MRI revealed a an abscess in the anterior lower leg and dorsal foot.  Operative incision and drainage was recommended and she proceed to the operating room.   Procedure in Detail: Patient was identified in pre-operative holding area. Formal consent was signed and the right lower extremity was marked. Patient was brought back to the operating room. Anesthesia was induced. The extremity was prepped and draped in the usual sterile fashion. Timeout was taken to confirm patient name, laterality, and procedure prior to incision.   Attention was then directed to the right lower extremity where the chronic ulcer of the right heel was identified and debrided.  This extended to  the level of the fascia of the posterior medial heel.  Did not probe, undermine or have sinus tract to anywhere else in the foot or ankle or leg.  I excised the ulcer in a 3-1 semielliptical fashion and irrigated it thoroughly.  I then directed my attention to the anterior lower leg and dorsal foot where a large area of fluctuance and cellulitis was present.  There is a serous blister present on the anterior ankle as well.  An incision was made over the anteromedial lower leg and foot just medial to the tibialis anterior tendon.  This was carried through the subcutaneous tissue and the superficial fascia, once this was incised a large amount of purulence was expressed from the wound.  I then made a counterincision in the anterolateral lower leg and dorsal foot lateral to the peroneus tertius tendon.  This was carried through the subcutaneous tissue and superficial fascia and a large amount of purulence was found here as well.  These 2 incisions were easily connected with blunt palpation and the entire pocket of pus was expressed.  I then made counterincisions in the lower portion of the medial foot and lateral heel adjacent to each incision.  Once all purulence had been thoroughly expressed from the wound I irrigated thoroughly with 3 L normal sterile saline with 80 mg of gentamicin irrigation.  The wounds were then loosely reapproximated with 2-0 nylon suture leaving the distal 1 cm in counterincisions open.  These were packed with 1 inch iodoform gauze packing.  The foot was then dressed with ABD pad, 4 x 4 gauze and Kerlix and an Ace wrap.. Patient tolerated the procedure well.  Disposition: Following a period of post-operative monitoring, patient will be transferred back to her inpatient unit for further antibiotics.

## 2020-12-01 NOTE — Progress Notes (Signed)
  Subjective:  Patient ID: Emily Collins, female    DOB: 26-Aug-1966,  MRN: [id number]  Feeling okay.  Husband Jonny Ruiz is at bedside.  Ready for surgery today  Negative for chest pain and shortness of breath Chest pain: no Shortness of breath: no Fever: no Night sweats: no Objective:   Vitals:   12/01/20 1246 12/01/20 1247  BP: (!) 119/57 (!) 119/57  Pulse: 77 77  Resp: 16 16  Temp: 98.3 F (36.8 C) 98.3 F (36.8 C)  SpO2:  96%   General AA&O x3. Normal mood and affect.  Vascular Dorsalis pedis and posterior tibial pulses 2/4 bilat. Brisk capillary refill to all digits.   Neurologic  sensation intact  Dermatologic  cellulitis improving, blister on anterior ankle, ulceration medial heel  Orthopedic: MMT 5/5    Assessment & Plan:  Patient was evaluated and treated and all questions answered.  55 year old female with plantar medial heel ulceration, cellulitis and large abscess lower leg, ankle and foot right -NPO -To OR today for I&D of abscess, will likely packed open to heal by secondary intention.  May require further operative intervention pending results -Continue IV antibiotics.  Will obtain intraoperative cultures -Receiving transfusion to maintain hemoglobin greater than 8.0 -Slightly hyperkalemic this morning at 5.5, received Regina Eck, DPM  Accessible via secure chat for questions or concerns.

## 2020-12-01 NOTE — Transfer of Care (Signed)
Immediate Anesthesia Transfer of Care Note  Patient: Emily Collins  Procedure(s) Performed: INCISION AND DRAINAGE (Right )  Patient Location: PACU  Anesthesia Type:General  Level of Consciousness: drowsy  Airway & Oxygen Therapy: Patient Spontanous Breathing and Patient connected to face mask oxygen  Post-op Assessment: Report given to RN  Post vital signs: stable  Last Vitals:  Vitals Value Taken Time  BP 119/68 12/01/20 1937  Temp    Pulse 69 12/01/20 1939  Resp 10 12/01/20 1939  SpO2 97 % 12/01/20 1939  Vitals shown include unvalidated device data.  Last Pain:  Vitals:   12/01/20 1456  TempSrc:   PainSc: 7       Patients Stated Pain Goal: 4 (11/29/20 1300)  Complications: No complications documented.

## 2020-12-02 ENCOUNTER — Encounter: Payer: Self-pay | Admitting: Podiatry

## 2020-12-02 DIAGNOSIS — E875 Hyperkalemia: Secondary | ICD-10-CM | POA: Diagnosis not present

## 2020-12-02 DIAGNOSIS — L03115 Cellulitis of right lower limb: Secondary | ICD-10-CM | POA: Diagnosis not present

## 2020-12-02 DIAGNOSIS — N179 Acute kidney failure, unspecified: Secondary | ICD-10-CM | POA: Diagnosis not present

## 2020-12-02 LAB — TYPE AND SCREEN
ABO/RH(D): A NEG
Antibody Screen: NEGATIVE
Unit division: 0

## 2020-12-02 LAB — GLUCOSE, CAPILLARY
Glucose-Capillary: 216 mg/dL — ABNORMAL HIGH (ref 70–99)
Glucose-Capillary: 304 mg/dL — ABNORMAL HIGH (ref 70–99)
Glucose-Capillary: 358 mg/dL — ABNORMAL HIGH (ref 70–99)
Glucose-Capillary: 379 mg/dL — ABNORMAL HIGH (ref 70–99)
Glucose-Capillary: 45 mg/dL — ABNORMAL LOW (ref 70–99)
Glucose-Capillary: 56 mg/dL — ABNORMAL LOW (ref 70–99)
Glucose-Capillary: 59 mg/dL — ABNORMAL LOW (ref 70–99)
Glucose-Capillary: 84 mg/dL (ref 70–99)

## 2020-12-02 LAB — CBC
HCT: 28 % — ABNORMAL LOW (ref 36.0–46.0)
Hemoglobin: 8.9 g/dL — ABNORMAL LOW (ref 12.0–15.0)
MCH: 27.3 pg (ref 26.0–34.0)
MCHC: 31.8 g/dL (ref 30.0–36.0)
MCV: 85.9 fL (ref 80.0–100.0)
Platelets: 568 10*3/uL — ABNORMAL HIGH (ref 150–400)
RBC: 3.26 MIL/uL — ABNORMAL LOW (ref 3.87–5.11)
RDW: 16.1 % — ABNORMAL HIGH (ref 11.5–15.5)
WBC: 16.9 10*3/uL — ABNORMAL HIGH (ref 4.0–10.5)
nRBC: 0 % (ref 0.0–0.2)

## 2020-12-02 LAB — BASIC METABOLIC PANEL
Anion gap: 8 (ref 5–15)
BUN: 35 mg/dL — ABNORMAL HIGH (ref 6–20)
CO2: 19 mmol/L — ABNORMAL LOW (ref 22–32)
Calcium: 7.9 mg/dL — ABNORMAL LOW (ref 8.9–10.3)
Chloride: 104 mmol/L (ref 98–111)
Creatinine, Ser: 1.61 mg/dL — ABNORMAL HIGH (ref 0.44–1.00)
GFR, Estimated: 38 mL/min — ABNORMAL LOW (ref >60)
Glucose, Bld: 413 mg/dL — ABNORMAL HIGH (ref 70–99)
Potassium: 6.9 mmol/L (ref 3.5–5.1)
Sodium: 131 mmol/L — ABNORMAL LOW (ref 135–145)

## 2020-12-02 LAB — BPAM RBC
Blood Product Expiration Date: 202203122359
ISSUE DATE / TIME: 202203091219
Unit Type and Rh: 600

## 2020-12-02 LAB — POTASSIUM: Potassium: 5.3 mmol/L — ABNORMAL HIGH (ref 3.5–5.1)

## 2020-12-02 MED ORDER — SODIUM CHLORIDE 0.9 % IV SOLN: 4.0000 g | Freq: Once | INTRAVENOUS | Status: DC

## 2020-12-02 MED ORDER — SODIUM ZIRCONIUM CYCLOSILICATE 10 G PO PACK
10.0000 g | PACK | Freq: Once | ORAL | Status: AC
  Administered 2020-12-02: 10 g via ORAL
  Filled 2020-12-02: qty 1

## 2020-12-02 MED ORDER — CALCIUM GLUCONATE-NACL 2-0.675 GM/100ML-% IV SOLN
2.0000 g | Freq: Once | INTRAVENOUS | Status: AC
  Administered 2020-12-02: 2000 mg via INTRAVENOUS
  Filled 2020-12-02 (×2): qty 100

## 2020-12-02 MED ORDER — HYDROCODONE-ACETAMINOPHEN 10-325 MG PO TABS
1.0000 | ORAL_TABLET | ORAL | Status: DC | PRN
  Administered 2020-12-02 – 2020-12-06 (×14): 2 via ORAL
  Administered 2020-12-07: 1 via ORAL
  Administered 2020-12-07 – 2020-12-09 (×7): 2 via ORAL
  Filled 2020-12-02 (×15): qty 2
  Filled 2020-12-02: qty 1
  Filled 2020-12-02 (×11): qty 2

## 2020-12-02 MED ORDER — FUROSEMIDE 10 MG/ML IJ SOLN
40.0000 mg | Freq: Once | INTRAMUSCULAR | Status: AC
  Administered 2020-12-02: 40 mg via INTRAVENOUS
  Filled 2020-12-02: qty 4

## 2020-12-02 MED ORDER — INSULIN GLARGINE 100 UNIT/ML ~~LOC~~ SOLN
50.0000 [IU] | Freq: Every day | SUBCUTANEOUS | Status: DC
  Administered 2020-12-02 – 2020-12-08 (×5): 50 [IU] via SUBCUTANEOUS
  Filled 2020-12-02 (×8): qty 0.5

## 2020-12-02 MED ORDER — SODIUM BICARBONATE 8.4 % IV SOLN
50.0000 meq | Freq: Once | INTRAVENOUS | Status: AC
  Administered 2020-12-02: 50 meq via INTRAVENOUS
  Filled 2020-12-02: qty 50

## 2020-12-02 NOTE — Progress Notes (Signed)
PT Cancellation Note  Patient Details Name: Emily Collins MRN: [id number] DOB: May 09, 1966   Cancelled Treatment:    Reason Eval/Treat Not Completed: Other (comment) Attempted to see her this afternoon.   CAM walker not yet in room, apparently has been ordered (but not delivered) as of 17:30. Will attempt to see tomorrow when appropriate equipment is available.  Malachi Pro, DPT 12/02/2020, 5:32 PM

## 2020-12-02 NOTE — Progress Notes (Signed)
Rechecked blood sugar. New value 45. Gave pt another 8oz orange juice. Will recheck in 20 minutes for increase in value.

## 2020-12-02 NOTE — Progress Notes (Signed)
PT Cancellation Note  Patient Details Name: Emily Collins MRN: [id number] DOB: [birth year]   Cancelled Treatment:    Reason Eval/Treat Not Completed: Patient not medically ready Pt with K+ 6.9, glucose >400.  Current lab values preclude work with PT at this time.  Will maintain on caseload and initiate PT when her numbers are appropriate.  Malachi Pro, DPT 12/02/2020, 11:34 AM

## 2020-12-02 NOTE — Progress Notes (Signed)
Pt's potassium is critical at 6.9. MD notified, pt placed on tele, new orders received at this time. VSS will CTM.  BP (!) 104/47 (BP Location: Left Arm)   Pulse 88   Temp 97.7 F (36.5 C) (Oral)   Resp 17   Ht 5\' 2"  (1.575 m)   Wt 103.8 kg   SpO2 96%   BMI 41.85 kg/m

## 2020-12-02 NOTE — TOC Initial Note (Addendum)
Transition of Care [facility]) - Initial/Assessment Note    Patient Details  Name: Emily Collins MRN: [id number] Date of Birth: 1966/08/02  Transition of Care [facility]) CM/SW Contact:    Magnus Ivan, LCSW Phone Number: 12/02/2020, 2:35 PM  Clinical Narrative:           CSW met with patient at bedside. Patient lives with her husband, son, daughter. PCP is Princella Ion. Pharmacy is [facility]. Patient has a RW, w/c, 3 in 1. Patient drives herself to appointments, spouse available to drive her if she is unable. Patient will need HHRN at discharge for dressing changes. Checked with all preferred [facility] providers. [place] with [facility] and [place] with [facility] are checking to see if they can staff this patient. Family will have to be taught to do dressing changes as well since patient will need daily changes and no HH agency can provide daily [place] visits.    Expected Discharge Plan: [place] Barriers to Discharge: Continued Medical Work up   Patient Goals and CMS Choice Patient states their goals for this hospitalization and ongoing recovery are:: home with home health CMS Medicare.gov Compare Post Acute Care list provided to:: Patient Choice offered to / list presented to : Patient  Expected Discharge Plan and Services Expected Discharge Plan: [place]       Living arrangements for the past 2 months: Single Family Home                           HH Arranged: RN          Prior Living Arrangements/Services Living arrangements for the past 2 months: Single Family Home Lives with:: Spouse,Adult Children Patient language and need for interpreter reviewed:: Yes Do you feel safe going back to the place where you live?: Yes      Need for Family Participation in Patient Care: Yes (Comment) Care giver support system in place?: Yes (comment) Current home services: DME Criminal Activity/Legal Involvement Pertinent to Current  Situation/Hospitalization: No - Comment as needed  Activities of Daily Living Home Assistive Devices/Equipment: Eyeglasses ADL Screening (condition at time of admission) Patient's cognitive ability adequate to safely complete daily activities?: Yes Is the patient deaf or have difficulty hearing?: No Does the patient have difficulty seeing, even when wearing glasses/contacts?: No Does the patient have difficulty concentrating, remembering, or making decisions?: No Patient able to express need for assistance with ADLs?: Yes Does the patient have difficulty dressing or bathing?: No Independently performs ADLs?: Yes (appropriate for developmental age) Does the patient have difficulty walking or climbing stairs?: No Weakness of Legs: None Weakness of Arms/Hands: None  Permission Sought/Granted Permission sought to share information with : Chartered certified accountant granted to share information with : Yes, Verbal Permission Granted     Permission granted to share info w AGENCY: [place], DME agencies        Emotional Assessment       Orientation: : Oriented to Self,Oriented to Place,Oriented to  Time,Oriented to Situation Alcohol / Substance Use: Not Applicable Psych Involvement: No (comment)  Admission diagnosis:  Cellulitis [L03.90] Right leg pain [M79.604] Cellulitis of right lower extremity [L03.115] Patient Active Problem List   Diagnosis Date Noted  . Right leg pain   . Ulcer of right heel and midfoot with fat layer exposed ([place])   . Abscess of right leg   . Uncontrolled type 2 diabetes mellitus with hyperglycemia (  [place])   . Cellulitis 11/29/2020  . Sepsis ([place]) 04/26/2019  . Arthritis 05/10/2016  . Chronic kidney disease 04/05/2016  . Allergic rhinitis 04/07/2015  . Depression 04/07/2015  . Diabetes mellitus type 1.5,uncontrolled, managed as type 2 04/07/2015  . Diabetic retinopathy ([place]) 04/07/2015  . Dysesthesia 04/07/2015  . Family history of early CAD  04/07/2015  . GERD (gastroesophageal reflux disease) 04/07/2015  . Hyperlipidemia 04/07/2015  . Insomnia 04/07/2015  . Tobacco abuse 04/07/2015  . Venous insufficiency 04/07/2015  . Anxiety 04/07/2015   PCP:  Letta Median, MD Pharmacy:   [facility] [phone] - [address], [place] AT [address] [place] [address] Phone: [phone] Fax: [phone]     Social Determinants of Health (SDOH) Interventions    Readmission Risk Interventions No flowsheet data found.

## 2020-12-02 NOTE — Progress Notes (Signed)
  Subjective:  Patient ID: [name], female    DOB: 1966/09/01,  MRN: [id number]  [name] status post right foot incision and drainage washout debridement of the abscess.  Patient overall is doing well.  She is complaining of pain to the ankle area.  She states overall it is manageable.  She denies any other acute complaints.  I am planning on doing a dressing change today to evaluate the incision site. Objective:   Vitals:   12/02/20 0357 12/02/20 0735  BP: (!) 124/52 (!) 104/47  Pulse: 85 88  Resp: 18 17  Temp: 97.6 F (36.4 C) 97.7 F (36.5 C)  SpO2: 97% 96%   General AA&O x3. Normal mood and affect.  Vascular Dorsalis pedis and posterior tibial pulses 2/4 bilat. Brisk capillary refill to all digits. Pedal hair present.  Neurologic Epicritic sensation grossly intact.  Dermatologic  dressings were taken down the incision site was inspected.  No clinical signs of infection noted no purulent drainage noted.  The open wound did not have any kind of drainage.  The packing was removed.  No further purulent drainage noted about decompression.  Did not probe down to bone.  Orthopedic: MMT 5/5 in dorsiflexion, plantarflexion, inversion, and eversion. Normal joint ROM without pain or crepitus.    Assessment & Plan:  Patient was evaluated and treated and all questions answered.  Right foot plantar medial heel ulceration with a large lower leg abscess status post incision and drainage -All questions and concerns were addressed and discussed with the patient. -Awaiting Intra-Op cultures.  Will narrow the antibiotics as needed on p.o.  Patient will benefit from 48 to 72 hours of antibiotics and she can be discharged afterwards.  Clinically the wound is looking well without any clinical signs of infection. -She will need nursing dressing changes every day with Betadine wet-to-dry dressing changes with packing if possible. -She can be weightbearing as tolerated with a cam  boot. -She can follow-up with [place] with Dr. Lilian Kapur next week.  We will go ahead and make her an appointment.   Candelaria Stagers, DPM  Accessible via secure chat for questions or concerns.

## 2020-12-02 NOTE — Progress Notes (Signed)
PROGRESS NOTE    Emily Collins  VOJ:[id number] DOB: September 19, 1966 DOA: 11/29/2020 PCP: Oswaldo Conroy, MD   Assessment & Plan:   Active Problems:   Cellulitis   Right leg pain   Ulcer of right heel and midfoot with fat layer exposed (HCC)   Abscess of right leg   Uncontrolled type 2 diabetes mellitus with hyperglycemia (HCC)  Right leg/foot cellulitis with right diabetic foot wound infection: s/p I&D of right leg & foot 12/01/20. Wound cx is pending. MRI concerning for developing abscess with associated extensor tenosynovitis.  Continue on IV flagyl, vanco & ceftriaxone and will narrow spectrum once cx results are available    AKI on CKDIIIa: Cr is trending down from day prior. Continue to hold ACE-I. Continue on IVFs   Hyperkalemia: increased from day prior. Calcium, bicarb, insulin, lasix & lokelma ordered. Repeat K level ordered. Continue on tele.  DM2: poorly controlled w/ HbA1c 10.2. Continue on lantus, SSI w/ accuchecks    Depression: severity unknown. Continue on home dose of fluoxetine  Morbid obesity: BMI 41.8. Complicates overall care and prognosis   Thrombocytosis: likely reactive. Will continue to monitor    Leukocytosis: likely secondary to infection. Continue on IV abxs    Likely ACD: s/p 1 unit of pRBCs transfused. Will continue to monitor H&H  Hyponatremia: labile. Will continue to monitor   DVT prophylaxis: lovenox  Code Status: full  Family Communication:  Disposition Plan: likely home w/ home health   Level of care: Med-Surg   Status is: Inpatient  Remains inpatient appropriate because:Ongoing diagnostic testing needed not appropriate for outpatient work up, Unsafe d/c plan, IV treatments appropriate due to intensity of illness or inability to take PO and Inpatient level of care appropriate due to severity of illness   Dispo: The patient is from: Home              Anticipated d/c is to: Home              Patient currently is not medically  stable to d/c.   Difficult to place patient Yes       Consultants:      Procedures:    Antimicrobials: vanco, flagyl, ceftriaxone   Subjective: Pt c/o foot & leg pain still   Objective: Vitals:   12/02/20 0014 12/02/20 0357 12/02/20 0735 12/02/20 1006  BP: (!) 113/54 (!) 124/52 (!) 104/47 (!) 99/51  Pulse: 80 85 88 92  Resp: 19 18 17 17   Temp: 98 F (36.7 C) 97.6 F (36.4 C) 97.7 F (36.5 C) 98.7 F (37.1 C)  TempSrc:   Oral   SpO2: 96% 97% 96% 93%  Weight:      Height:        Intake/Output Summary (Last 24 hours) at 12/02/2020 1121 Last data filed at 12/02/2020 1027 Gross per 24 hour  Intake 1428 ml  Output 5 ml  Net 1423 ml   Filed Weights   11/29/20 1301 11/30/20 0353  Weight: 104 kg 103.8 kg    Examination:  General exam: Appears comfortable  Respiratory system: clear breath sounds b/l. No wheezes or rales  Cardiovascular system: S1/S2+. No clicks or rubs  Gastrointestinal system: Abd is soft, NT, obese & hypoactive bowel sounds  Central nervous system: Alert and oriented. Moves all 4 extremities Skin: right foot is dressed & dressing is C/D/I  Psychiatry: Judgement and insight appear normal. Flat mood and affect     Data Reviewed: I have personally reviewed following labs  and imaging studies  CBC: Recent Labs  Lab 11/29/20 1308 11/29/20 2258 11/30/20 0432 12/01/20 0509 12/01/20 1821 12/02/20 0502  WBC 15.6* 16.1* 14.7* 15.9*  --  16.9*  NEUTROABS 12.0*  --   --  11.2*  --   --   HGB 8.6* 8.5* 8.2* 7.6* 10.2* 8.9*  HCT 27.0* 27.5* 26.1* 24.9* 30.0* 28.0*  MCV 85.2 87.3 86.7 87.4  --  85.9  PLT 520* 548* 517* 558*  --  568*   Basic Metabolic Panel: Recent Labs  Lab 11/29/20 1308 11/29/20 2258 11/30/20 0432 12/01/20 0509 12/01/20 1821 12/02/20 0659  NA 133* 134* 135 133* 135 131*  K 5.5* 5.0 5.2* 5.4* 5.2* 6.9*  CL 104 105 106 105 103 104  CO2 21* 21* 20* 21*  --  19*  GLUCOSE 168* 94 176* 198* 59* 413*  BUN 23* 26* 27*  34* 30* 35*  CREATININE 1.40* 1.65* 1.73* 1.78* 1.70* 1.61*  CALCIUM 8.5* 8.3* 8.2* 8.2*  --  7.9*  MG  --  2.6*  --   --   --   --   PHOS  --  4.4  --   --   --   --    GFR: Estimated Creatinine Clearance: 44.6 mL/min (A) (by C-G formula based on SCr of 1.61 mg/dL (H)). Liver Function Tests: Recent Labs  Lab 11/29/20 1308 11/30/20 0432  AST 28 21  ALT 24 21  ALKPHOS 117 105  BILITOT 0.4 0.6  PROT 8.1 7.5  ALBUMIN 3.1* 2.8*   No results for input(s): LIPASE, AMYLASE in the last 168 hours. No results for input(s): AMMONIA in the last 168 hours. Coagulation Profile: No results for input(s): INR, PROTIME in the last 168 hours. Cardiac Enzymes: No results for input(s): CKTOTAL, CKMB, CKMBINDEX, TROPONINI in the last 168 hours. BNP (last 3 results) No results for input(s): PROBNP in the last 8760 hours. HbA1C: Recent Labs    11/29/20 2258  HGBA1C 10.2*   CBG: Recent Labs  Lab 12/01/20 1942 12/01/20 2109 12/02/20 0014 12/02/20 0419 12/02/20 0736  GLUCAP 138* 202* 216* 304* 358*   Lipid Profile: No results for input(s): CHOL, HDL, LDLCALC, TRIG, CHOLHDL, LDLDIRECT in the last 72 hours. Thyroid Function Tests: Recent Labs    11/29/20 2258  TSH 4.501*   Anemia Panel: No results for input(s): VITAMINB12, FOLATE, FERRITIN, TIBC, IRON, RETICCTPCT in the last 72 hours. Sepsis Labs: Recent Labs  Lab 11/29/20 1308  LATICACIDVEN 1.2    Recent Results (from the past 240 hour(s))  SARS CORONAVIRUS 2 (TAT 6-24 HRS) Nasopharyngeal Nasopharyngeal Swab     Status: None   Collection Time: 11/29/20  4:20 PM   Specimen: Nasopharyngeal Swab  Result Value Ref Range Status   SARS Coronavirus 2 NEGATIVE NEGATIVE Final    Comment: (NOTE) SARS-CoV-2 target nucleic acids are NOT DETECTED.  The SARS-CoV-2 RNA is generally detectable in upper and lower respiratory specimens during the acute phase of infection. Negative results do not preclude SARS-CoV-2 infection, do not rule  out co-infections with other pathogens, and should not be used as the sole basis for treatment or other patient management decisions. Negative results must be combined with clinical observations, patient history, and epidemiological information. The expected result is Negative.  Fact Sheet for Patients: HairSlick.no  Fact Sheet for Healthcare Providers: quierodirigir.com  This test is not yet approved or cleared by the [place] FDA and  has been authorized for detection and/or diagnosis of SARS-CoV-2 by FDA under  an Emergency Use Authorization (EUA). This EUA will remain  in effect (meaning this test can be used) for the duration of the COVID-19 declaration under Se ction 564(b)(1) of the Act, 21 U.S.C. section 360bbb-3(b)(1), unless the authorization is terminated or revoked sooner.  Performed at [facility], [address] [place], [place] [address]          Radiology Studies: No results found.      Scheduled Meds: . amitriptyline  25 mg Oral QHS  . aspirin EC  81 mg Oral Daily  . atorvastatin  40 mg Oral Daily  . calcium carbonate  1 tablet Oral BID WC  . docusate sodium  100 mg Oral BID  . enoxaparin (LOVENOX) injection  0.5 mg/kg Subcutaneous Q24H  . famotidine  40 mg Oral Daily  . FLUoxetine  40 mg Oral Daily  . insulin aspart  0-20 Units Subcutaneous TID WC  . insulin aspart  0-5 Units Subcutaneous QHS  . insulin glargine  30 Units Subcutaneous QHS  . insulin glargine  50 Units Subcutaneous Daily  . ipratropium  2 puff Inhalation Q4H  . loratadine  10 mg Oral Daily  . montelukast  10 mg Oral QHS  . pantoprazole  40 mg Oral Daily  . pregabalin  150 mg Oral Daily   Continuous Infusions: . sodium chloride 50 mL/hr at 12/01/20 2104  . cefTRIAXone (ROCEPHIN)  IV 2 g (12/01/20 1702)  . metronidazole 500 mg (12/02/20 8882)  . vancomycin 750 mg (12/01/20 1544)     LOS: 3 days    Time  spent: 33 mins     Charise Killian, MD [facility] Pager [phone]  If 7PM-7AM, please contact night-coverage 12/02/2020, 11:21 AM

## 2020-12-02 NOTE — Plan of Care (Signed)

## 2020-12-03 DIAGNOSIS — L02619 Cutaneous abscess of unspecified foot: Secondary | ICD-10-CM

## 2020-12-03 DIAGNOSIS — E1165 Type 2 diabetes mellitus with hyperglycemia: Secondary | ICD-10-CM | POA: Diagnosis not present

## 2020-12-03 DIAGNOSIS — L03119 Cellulitis of unspecified part of limb: Secondary | ICD-10-CM

## 2020-12-03 DIAGNOSIS — L03115 Cellulitis of right lower limb: Secondary | ICD-10-CM | POA: Diagnosis not present

## 2020-12-03 DIAGNOSIS — N179 Acute kidney failure, unspecified: Secondary | ICD-10-CM | POA: Diagnosis not present

## 2020-12-03 LAB — MRSA PCR SCREENING: MRSA by PCR: NEGATIVE

## 2020-12-03 LAB — CBC
HCT: 28.1 % — ABNORMAL LOW (ref 36.0–46.0)
Hemoglobin: 8.7 g/dL — ABNORMAL LOW (ref 12.0–15.0)
MCH: 27.1 pg (ref 26.0–34.0)
MCHC: 31 g/dL (ref 30.0–36.0)
MCV: 87.5 fL (ref 80.0–100.0)
Platelets: 622 10*3/uL — ABNORMAL HIGH (ref 150–400)
RBC: 3.21 MIL/uL — ABNORMAL LOW (ref 3.87–5.11)
RDW: 15.9 % — ABNORMAL HIGH (ref 11.5–15.5)
WBC: 17 10*3/uL — ABNORMAL HIGH (ref 4.0–10.5)
nRBC: 0 % (ref 0.0–0.2)

## 2020-12-03 LAB — BASIC METABOLIC PANEL
Anion gap: 9 (ref 5–15)
BUN: 40 mg/dL — ABNORMAL HIGH (ref 6–20)
CO2: 22 mmol/L (ref 22–32)
Calcium: 8.5 mg/dL — ABNORMAL LOW (ref 8.9–10.3)
Chloride: 106 mmol/L (ref 98–111)
Creatinine, Ser: 1.93 mg/dL — ABNORMAL HIGH (ref 0.44–1.00)
GFR, Estimated: 30 mL/min — ABNORMAL LOW (ref >60)
Glucose, Bld: 117 mg/dL — ABNORMAL HIGH (ref 70–99)
Potassium: 4.8 mmol/L (ref 3.5–5.1)
Sodium: 137 mmol/L (ref 135–145)

## 2020-12-03 LAB — GLUCOSE, CAPILLARY
Glucose-Capillary: 116 mg/dL — ABNORMAL HIGH (ref 70–99)
Glucose-Capillary: 158 mg/dL — ABNORMAL HIGH (ref 70–99)
Glucose-Capillary: 61 mg/dL — ABNORMAL LOW (ref 70–99)
Glucose-Capillary: 68 mg/dL — ABNORMAL LOW (ref 70–99)
Glucose-Capillary: 69 mg/dL — ABNORMAL LOW (ref 70–99)
Glucose-Capillary: 71 mg/dL (ref 70–99)
Glucose-Capillary: 83 mg/dL (ref 70–99)

## 2020-12-03 NOTE — Evaluation (Signed)
Physical Therapy Evaluation Patient Details Name: Emily Collins MRN: [id number] DOB: 1966/06/01 Today's Date: 12/03/2020   History of Present Illness  Emily Collins is a 54yoF who [facility] on 11/29[facility]20 c Rt heel ulcer infection x 2 weeks. PMH: GERD, OA, asthma, HLD, HTN, depression, GAD, DM1, PVD, retinopathy, PND, CKD3. Pt went to OR with podiatry on 3/9 for I&D now WBAT with CAM rocker.  Clinical Impression  Pt admitted with above diagnosis. Pt currently with functional limitations due to the deficits listed below (see "PT Problem List"). Upon entry, pt in bed, awake and agreeable to participate. The pt is alert, pleasant, interactive, and able to provide info regarding prior level of function, both in tolerance and independence. Pt educated on precautions, educated on CAM rocker setup and use, Podiatry later arrives, reports ok for patient to remove CAM rocker for comfort at night ad lib. Pt in much pain, meds already given, but pain not limiting to mobility. Supervision level bed mobility, transfers, and AMB c RW. Pt performs 50+ft in room without difficulty. Will progress distance slightly next date, also focus on stairs performance for home entry purposes. Patient's performance this date reveals decreased tolerance in performing all basic mobility required for performance of activities of daily living. Pt requires additional DME, intermittent supervision and cues for safe participate in mobility. Pt will benefit from skilled PT intervention to increase independence and safety with basic mobility in preparation for discharge to the venue listed below.       Follow Up Recommendations No PT follow up    Equipment Recommendations  None recommended by PT (Pt already has a RW at home)    Recommendations for Other Services       Precautions / Restrictions Precautions Precautions: Fall Restrictions Weight Bearing Restrictions: Yes RLE Weight Bearing: Weight bearing as tolerated Other  Position/Activity Restrictions: CAM rocker on "at all times"; podiatry gives verbal consent for removal at night for comfort      Mobility  Bed Mobility Overal bed mobility: Modified Independent                  Transfers Overall transfer level: Needs assistance Equipment used: Rolling walker (2 wheeled);None Transfers: Sit to/from [employer] to Stand: Supervision Stand pivot transfers: Supervision       General transfer comment: Adult nurse  Ambulation/Gait Ambulation/Gait assistance: Supervision Gait Distance (Feet): 55 Feet Assistive device: Rolling walker (2 wheeled) Gait Pattern/deviations: WFL(Within Functional Limits)     General Gait Details: pain fairly stable; no imbalance, pt tlerates well.  Stairs Stairs: Yes (will defer to next session)          Wheelchair Mobility    Modified Rankin (Stroke Patients Only)       Balance Overall balance assessment: Modified Independent                                           Pertinent Vitals/Pain Pain Assessment: 0-10 Pain Score: 8  Pain Location: Right surgical foot Pain Descriptors / Indicators: Aching Pain Intervention(s): Limited activity within patient's tolerance    Home Living Family/patient expects to be discharged to:: Private residence Living Arrangements: Spouse/significant other;Children (son/DTR home all the time, adult age; Son is post 4 strokes but can assist as needed) Available Help at Discharge: Family Type of Home: Mobile home Home Access: Stairs to enter Entrance Stairs-Rails: Left;Right;Can  reach both Entrance Stairs-Number of Steps: 5 Home Layout: One level Home Equipment: Walker - 2 wheels;Bedside commode;Shower seat      Prior Function Level of Independence: Independent         Comments: at baseline still drives, groceries, no falls or balance issues.     Hand Dominance   Dominant Hand: Right    Extremity/Trunk  Assessment   Upper Extremity Assessment Upper Extremity Assessment: Overall WFL for tasks assessed    Lower Extremity Assessment Lower Extremity Assessment: Overall WFL for tasks assessed       Communication      Cognition Arousal/Alertness: Awake/alert Behavior During Therapy: WFL for tasks assessed/performed Overall Cognitive Status: Within Functional Limits for tasks assessed                                        General Comments      Exercises     Assessment/Plan    PT Assessment Patient needs continued PT services  PT Problem List Decreased strength;Decreased activity tolerance;Decreased range of motion;Decreased mobility;Decreased knowledge of precautions;Pain       PT Treatment Interventions DME instruction;Gait training;Stair training;Functional mobility training;Therapeutic activities;Therapeutic exercise;Patient/family education    PT Goals (Current goals can be found in the Care Plan section)  Acute Rehab PT Goals Patient Stated Goal: return to home PT Goal Formulation: With patient Time For Goal Achievement: 12/17/20 Potential to Achieve Goals: Good    Frequency Min 2X/week   Barriers to discharge        Co-evaluation               AM-PAC PT "6 Clicks" Mobility  Outcome Measure Help needed turning from your back to your side while in a flat bed without using bedrails?: None Help needed moving from lying on your back to sitting on the side of a flat bed without using bedrails?: None Help needed moving to and from a bed to a chair (including a wheelchair)?: A Little Help needed standing up from a chair using your arms (e.g., wheelchair or bedside chair)?: A Little Help needed to walk in hospital room?: A Little Help needed climbing 3-5 steps with a railing? : A Little 6 Click Score: 20    End of Session Equipment Utilized During Treatment:  (CAM rocker) Activity Tolerance: Patient tolerated treatment well;No increased  pain Patient left: in chair;with call bell/phone within reach;Other (comment) (podiatry in process of dressing change) Nurse Communication: Mobility status PT Visit Diagnosis: Difficulty in walking, not elsewhere classified (R26.2);Other symptoms and signs involving the nervous system (R29.898)    Time: 8341-9622 PT Time Calculation (min) (ACUTE ONLY): 26 min   Charges:   PT Evaluation $PT Eval Low Complexity: 1 Low PT Treatments $Gait Training: 8-22 mins        4:36 PM, 12/03/20 Rosamaria Lints, PT, DPT Physical Therapist - [facility]  [phone] (ASCOM)    Bernetha Anschutz C 12/03/2020, 4:32 PM

## 2020-12-03 NOTE — Progress Notes (Signed)
   PODIATRY PROGRESS NOTE  NAME Emily Collins MRN [id number] DOB Jan 25, 1966 DOA 11/29/2020   Chief Complaint  Patient presents with  . Leg Pain    History of present illness: 55 y.o. female S/P I&D RT foot and ankle.  Patient resting at bedside comfortably.  She continues to experience intermittent sharp shooting pains.  Overall pain is manageable.  No other acute complaints at this time.  She denies nausea vomiting fever chills shortness of breath or chest pain.  Past Medical History:  Diagnosis Date  . Allergy   . Anxiety   . Arthritis   . Asthma   . Depression   . Diabetes mellitus without complication (HCC)   . GERD (gastroesophageal reflux disease)   . History of chicken pox 04/07/2015   DID have Chicken Pox.    . Hyperlipidemia   . Hypertension   . Neuromuscular disorder (HCC)   . Venous (peripheral) insufficiency     CBC Latest Ref Rng & Units 12/03/2020 12/02/2020 12/01/2020  WBC 4.0 - 10.5 K/uL 17.0(H) 16.9(H) -  Hemoglobin 12.0 - 15.0 g/dL 3.1(S) 8.9(L) 10.2(L)  Hematocrit 36.0 - 46.0 % 28.1(L) 28.0(L) 30.0(L)  Platelets 150 - 400 K/uL 622(H) 568(H) -    BMP Latest Ref Rng & Units 12/03/2020 12/02/2020 12/02/2020  Glucose 70 - 99 mg/dL 970(Y) - 637(C)  BUN 6 - 20 mg/dL 58(I) - 50(Y)  Creatinine 0.44 - 1.00 mg/dL 7.74(J) - 2.87(O)  BUN/Creat Ratio 9 - 23 - - -  Sodium 135 - 145 mmol/L 137 - 131(L)  Potassium 3.5 - 5.1 mmol/L 4.8 5.3(H) 6.9(HH)  Chloride 98 - 111 mmol/L 106 - 104  CO2 22 - 32 mmol/L 22 - 19(L)  Calcium 8.9 - 10.3 mg/dL 6.7(E) - 7.9(L)       Objective/Physical Exam Neurovascular status intact.  Skin incisions appear to be well coapted with sutures and intact. Packing left intact to the anterior ankle incision sites since they were just changed today. No active bleeding noted.   Overall it appears that the erythema and edema of the ankle appear improved and stable  Assessment/plan of care: 1.  S/P I&D RT ankle.  DOS: 12/01/2020 -Last labs  taken 4 AM this morning WBC still elevated.  Continue IV antibiotics as per hospitalist -Continue daily dressing change orders -Patient may remove the cam boot when resting in bed or in the chair.  Cam boot when weightbearing or walking.  She may weight-bear as tolerated in the cam boot -Intraoperative cultures.  NO GROWTH 1 DAY. PENDING.  -We will continue to monitor to see if WBC continues to improve over the next 48 hours.  There is no improvement over the weekend she may require repeat I&D/washout of the ankle. -Podiatry will continue to follow.  Will check in on patient over the weekend, likely Sunday.     Please contact me directly with any questions or concerns.  Cell [phone]   [name], DPM [facility]  Dr. [name], DPM    [address] [place], [place] [address]                Office [phone]  Fax [phone]

## 2020-12-03 NOTE — Progress Notes (Signed)
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 Wound cx shows NGTD. Continue IV vanco & ceftriaxone, & will narrow spectrum as per cx results  AKI on CKDIIIa: Cr is trending up from day prior. If continues to rise will d/c vanco & start another abx for MRSA coverage. Continue to hold ACE-I.   Hyperkalemia: resolved   DM2: poorly controlled w/ HbA1c 10.2. Continue on lantus, SSI w/ accuchecks   Depression: severity unknown. Continue on home dose of fluoxetine e  Morbid obesity: BMI 41.8. Complicates overall care and prognosis   Thrombocytosis: likely reactive. Will continue to monitor   Leukocytosis: likely secondary to infection. Continue on IV abxs   Likely ACD: s/p 1 unit of pRBCs transfused. H&H are labile   Hyponatremia: resolved   DVT prophylaxis: lovenox  Code Status: full  Family Communication:  Disposition Plan: likely home w/ home health   Level of care: Med-Surg   Status is: Inpatient  Remains inpatient appropriate because:Ongoing diagnostic testing needed not appropriate for outpatient work up, Unsafe d/c plan, IV treatments appropriate due to intensity of illness or inability to take PO and Inpatient level of care appropriate due to severity of illness   Dispo: The patient is from: Home              Anticipated d/c is to: Home              Patient currently is not medically stable to d/c.   Difficult to place patient Yes       Consultants:      Procedures:    Antimicrobials: vanco, ceftriaxone   Subjective: Pt c/o foot pain but improved from day  prior   Objective: Vitals:   12/02/20 1515 12/02/20 1955 12/03/20 0136 12/03/20 0551  BP: (!) 99/47 (!) 107/58 134/74 113/63  Pulse: 81 72 80 75  Resp: 16 18 19 18   Temp: 99 F (37.2 C) 97.6 F (36.4 C) (!) 97.5 F (36.4 C) (!) 97.3 F (36.3 C)  TempSrc:  Oral Oral Oral  SpO2: 99% 96% 93% 97%  Weight:      Height:        Intake/Output Summary (Last 24 hours) at 12/03/2020 0723 Last data filed at 12/03/2020 0435 Gross per 24 hour  Intake 1407 ml  Output --  Net 1407 ml   Filed Weights   11/29/20 1301 11/30/20 0353  Weight: 104 kg 103.8 kg    Examination:  General exam: Appears calm & comfortable  Respiratory system: clear breath sounds b/. No wheezes, rales Cardiovascular system: S1 & S2+. No rubs or clicks  Gastrointestinal system: Abd is soft, NT, obese & normal bowel sounds  Central nervous system: Alert and oriented. Moves all extremities  Skin: right foot is dressed and dressing dirty & partially saturated w/ dry blood  Psychiatry: Judgement and insight appear normal. Flat mood and affect    Data Reviewed: I have personally reviewed following labs and imaging studies  CBC: Recent Labs  Lab 11/29/20 1308 11/29/20 2258 11/30/20 0432 12/01/20 0509 12/01/20 1821 12/02/20 0502  12/03/20 0412  WBC 15.6* 16.1* 14.7* 15.9*  --  16.9* 17.0*  NEUTROABS 12.0*  --   --  11.2*  --   --   --   HGB 8.6* 8.5* 8.2* 7.6* 10.2* 8.9* 8.7*  HCT 27.0* 27.5* 26.1* 24.9* 30.0* 28.0* 28.1*  MCV 85.2 87.3 86.7 87.4  --  85.9 87.5  PLT 520* 548* 517* 558*  --  568* 622*   Basic Metabolic Panel: Recent Labs  Lab 11/29/20 2258 11/30/20 0432 12/01/20 0509 12/01/20 1821 12/02/20 0659 12/02/20 1213 12/03/20 0412  NA 134* 135 133* 135 131*  --  137  K 5.0 5.2* 5.4* 5.2* 6.9* 5.3* 4.8  CL 105 106 105 103 104  --  106  CO2 21* 20* 21*  --  19*  --  22  GLUCOSE 94 176* 198* 59* 413*  --  117*  BUN 26* 27* 34* 30* 35*  --  40*  CREATININE 1.65* 1.73* 1.78* 1.70* 1.61*  --   1.93*  CALCIUM 8.3* 8.2* 8.2*  --  7.9*  --  8.5*  MG 2.6*  --   --   --   --   --   --   PHOS 4.4  --   --   --   --   --   --    GFR: Estimated Creatinine Clearance: 37.2 mL/min (A) (by C-G formula based on SCr of 1.93 mg/dL (H)). Liver Function Tests: Recent Labs  Lab 11/29/20 1308 11/30/20 0432  AST 28 21  ALT 24 21  ALKPHOS 117 105  BILITOT 0.4 0.6  PROT 8.1 7.5  ALBUMIN 3.1* 2.8*   No results for input(s): LIPASE, AMYLASE in the last 168 hours. No results for input(s): AMMONIA in the last 168 hours. Coagulation Profile: No results for input(s): INR, PROTIME in the last 168 hours. Cardiac Enzymes: No results for input(s): CKTOTAL, CKMB, CKMBINDEX, TROPONINI in the last 168 hours. BNP (last 3 results) No results for input(s): PROBNP in the last 8760 hours. HbA1C: No results for input(s): HGBA1C in the last 72 hours. CBG: Recent Labs  Lab 12/02/20 1633 12/02/20 1958 12/02/20 2145 12/02/20 2250 12/03/20 0014  GLUCAP 84 56* 45* 59* 116*   Lipid Profile: No results for input(s): CHOL, HDL, LDLCALC, TRIG, CHOLHDL, LDLDIRECT in the last 72 hours. Thyroid Function Tests: No results for input(s): TSH, T4TOTAL, FREET4, T3FREE, THYROIDAB in the last 72 hours. Anemia Panel: No results for input(s): VITAMINB12, FOLATE, FERRITIN, TIBC, IRON, RETICCTPCT in the last 72 hours. Sepsis Labs: Recent Labs  Lab 11/29/20 1308  LATICACIDVEN 1.2    Recent Results (from the past 240 hour(s))  SARS CORONAVIRUS 2 (TAT 6-24 HRS) Nasopharyngeal Nasopharyngeal Swab     Status: None   Collection Time: 11/29/20  4:20 PM   Specimen: Nasopharyngeal Swab  Result Value Ref Range Status   SARS Coronavirus 2 NEGATIVE NEGATIVE Final    Comment: (NOTE) SARS-CoV-2 target nucleic acids are NOT DETECTED.  The SARS-CoV-2 RNA is generally detectable in upper and lower respiratory specimens during the acute phase of infection. Negative results do not preclude SARS-CoV-2 infection, do not rule  out co-infections with other pathogens, and should not be used as the sole basis for treatment or other patient management decisions. Negative results must be combined with clinical observations, patient history, and epidemiological information. The expected result is Negative.  Fact Sheet for Patients: HairSlick.no  Fact Sheet for Healthcare Providers: quierodirigir.com  This test is not yet approved or cleared  by the [place] and  has been authorized for detection and/or diagnosis of SARS-CoV-2 by FDA under an Emergency Use Authorization (EUA). This EUA will remain  in effect (meaning this test can be used) for the duration of the COVID-19 declaration under Se ction 564(b)(1) of the Act, 21 U.S.C. section 360bbb-3(b)(1), unless the authorization is terminated or revoked sooner.  Performed at [facility], [address], [place], [place] [address]   Aerobic/Anaerobic Culture w Gram Stain (surgical/deep wound)     Status: None (Preliminary result)   Collection Time: 12/01/20  6:56 PM   Specimen: Wound  Result Value Ref Range Status   Specimen Description   Final    ABSCESS RIGHT ANKLE CULTURE Performed at [facility], [address], [place], [place] [address]    Special Requests   Final    NONE Performed at [facility], [address] [place], [place] [address]    Gram Stain   Final    ABUNDANT WBC PRESENT, PREDOMINANTLY PMN NO ORGANISMS SEEN Performed at [facility], [address], [place], [place] [address]    Culture PENDING  Incomplete   Report Status PENDING  Incomplete         Radiology Studies: No results found.      Scheduled Meds: . amitriptyline  25 mg Oral QHS  . aspirin EC  81 mg Oral Daily  . atorvastatin  40 mg Oral Daily  . calcium carbonate  1 tablet Oral BID WC  . docusate sodium  100 mg Oral BID  . enoxaparin (LOVENOX) injection  0.5  mg/kg Subcutaneous Q24H  . famotidine  40 mg Oral Daily  . FLUoxetine  40 mg Oral Daily  . insulin aspart  0-20 Units Subcutaneous TID WC  . insulin aspart  0-5 Units Subcutaneous QHS  . insulin glargine  30 Units Subcutaneous QHS  . insulin glargine  50 Units Subcutaneous Daily  . ipratropium  2 puff Inhalation Q4H  . loratadine  10 mg Oral Daily  . montelukast  10 mg Oral QHS  . pantoprazole  40 mg Oral Daily  . pregabalin  150 mg Oral Daily   Continuous Infusions: . sodium chloride 50 mL/hr at 12/03/20 0435  . cefTRIAXone (ROCEPHIN)  IV 2 g (12/02/20 1427)  . metronidazole 500 mg (12/03/20 0626)  . vancomycin 750 mg (12/02/20 1229)     LOS: 4 days    Time spent: 32 mins     Charise Killian, MD [facility] Pager [phone]  If 7PM-7AM, please contact night-coverage 12/03/2020, 7:23 AM

## 2020-12-03 NOTE — TOC Progression Note (Signed)
Transition of Care [facility]) - Progression Note    Patient Details  Name: Emily Collins MRN: [id number] Date of Birth: December 20, 1965  Transition of [facility] CM/SW Contact  Liliana Cline, LCSW Phone Number: 12/03/2020, 8:47 AM  Clinical Narrative: [facility] has agreed to provide [facility] services for patient. Start of care Tuesday. Family will have to learn to do dressing changes on the days [facility] does not come. CSW updated LPN and MD.      Expected Discharge Plan: Home w Home Health Services Barriers to Discharge: Continued Medical Work up  Expected Discharge Plan and Services Expected Discharge Plan: Home w Home Health Services       Living arrangements for the past 2 months: Single Family Home                           HH Arranged: RN           Social Determinants of Health (SDOH) Interventions    Readmission Risk Interventions No flowsheet data found.

## 2020-12-03 NOTE — Plan of Care (Signed)

## 2020-12-03 NOTE — Progress Notes (Signed)
Dressing change to RLE complete after morphine was given to pt.  Tolerated well.  Moderate bright/dark red blood present through all guaze and ACE bandage at time of change.  Once removed and cleansed no active bleeding noted.  Iodoform applied, wet to dry guaze applied and LE was wrapped with ACE.  Pt resting comfortably in bed LE is elevated.

## 2020-12-03 NOTE — Progress Notes (Addendum)
Inpatient Diabetes Program Recommendations  AACE/ADA: New Consensus Statement on Inpatient Glycemic Control (2015)  Target Ranges:  Prepandial:   less than 140 mg/dL      Peak postprandial:   less than 180 mg/dL (1-2 hours)      Critically ill patients:  140 - 180 mg/dL   Results for Emily Collins, Emily Collins (MRN [id number]) as of 12/03/2020 06:24  Ref. Range 12/01/2020 07:50 12/01/2020 12:32 12/01/2020 15:06 12/01/2020 15:28 12/01/2020 19:42 12/01/2020 21:09  Glucose-Capillary Latest Ref Range: 70 - 99 mg/dL 102 (H)  11 units NOVOLOG  60 units LANTUS  159 (H)  4 units NOVOLOG  38 (LL) 95 138 (H) 202 (H)  2 units NOVOLOG  30 units LANTUS   Results for Emily Collins, Emily Collins (MRN [id number]) as of 12/03/2020 06:24  Ref. Range 12/02/2020 00:14 12/02/2020 04:19 12/02/2020 07:36 12/02/2020 11:55 12/02/2020 16:33 12/02/2020 19:58 12/02/2020 21:45 12/02/2020 22:50 12/03/2020 00:14  Glucose-Capillary Latest Ref Range: 70 - 99 mg/dL 347 (H) 425 (H) 956 (H)  20 units NOVOLOG  379 (H)  20 units NOVOLOG  50 units LANTUS 84 56 (L) 45 (L) 59 (L) 116 (H)    Home DM Meds: Humalog Inpen                             InPen settings include:Target blood glucose 100, Sensitivity 30, I:C ratio 10                             Levemir 60 units AM/ 40 units PM                             Trulicity 3mg  Qweek  Current Orders: Lantus 50 units AM/ 30 units PM                            Novolog 0-20 units TID AC + HS     MD- Note severe Hypoglycemic events on both the afternoon of 03/09 and the again yesterday 03/10 from 8pm thru 11pm.  Note Lantus 30 unit dose was HELD last PM due to HYPO  Unsure if the Lantus or the large doses of Novolog are the culprit for the HYPO events?  Please consider:  1. Reduce Lantus to 30 units BID (60% total home dose)  2. Reduce Novolog SSi to the 0-15 unit scale (Moderate) TID AC + HS  3. Start Novolog Meal Coverage: Novolog 4 units TID with meals Hold if pt eats <50% of meal, Hold if pt  NPO    --Will follow patient during hospitalization--  05/10 RN, MSN, CDE Diabetes Coordinator Inpatient Glycemic Control Team Team Pager: [phone] (8a-5p)

## 2020-12-04 DIAGNOSIS — L03119 Cellulitis of unspecified part of limb: Secondary | ICD-10-CM | POA: Diagnosis not present

## 2020-12-04 DIAGNOSIS — N179 Acute kidney failure, unspecified: Secondary | ICD-10-CM | POA: Diagnosis not present

## 2020-12-04 DIAGNOSIS — E1165 Type 2 diabetes mellitus with hyperglycemia: Secondary | ICD-10-CM | POA: Diagnosis not present

## 2020-12-04 DIAGNOSIS — L02619 Cutaneous abscess of unspecified foot: Secondary | ICD-10-CM | POA: Diagnosis not present

## 2020-12-04 LAB — CBC
HCT: 28.2 % — ABNORMAL LOW (ref 36.0–46.0)
Hemoglobin: 8.7 g/dL — ABNORMAL LOW (ref 12.0–15.0)
MCH: 27.4 pg (ref 26.0–34.0)
MCHC: 30.9 g/dL (ref 30.0–36.0)
MCV: 89 fL (ref 80.0–100.0)
Platelets: 634 10*3/uL — ABNORMAL HIGH (ref 150–400)
RBC: 3.17 MIL/uL — ABNORMAL LOW (ref 3.87–5.11)
RDW: 16 % — ABNORMAL HIGH (ref 11.5–15.5)
WBC: 14.2 10*3/uL — ABNORMAL HIGH (ref 4.0–10.5)
nRBC: 0 % (ref 0.0–0.2)

## 2020-12-04 LAB — BASIC METABOLIC PANEL
Anion gap: 8 (ref 5–15)
BUN: 35 mg/dL — ABNORMAL HIGH (ref 6–20)
CO2: 22 mmol/L (ref 22–32)
Calcium: 8.3 mg/dL — ABNORMAL LOW (ref 8.9–10.3)
Chloride: 108 mmol/L (ref 98–111)
Creatinine, Ser: 1.63 mg/dL — ABNORMAL HIGH (ref 0.44–1.00)
GFR, Estimated: 37 mL/min — ABNORMAL LOW (ref >60)
Glucose, Bld: 43 mg/dL — CL (ref 70–99)
Potassium: 4.7 mmol/L (ref 3.5–5.1)
Sodium: 138 mmol/L (ref 135–145)

## 2020-12-04 LAB — GLUCOSE, CAPILLARY
Glucose-Capillary: 53 mg/dL — ABNORMAL LOW (ref 70–99)
Glucose-Capillary: 44 mg/dL — CL (ref 70–99)
Glucose-Capillary: 98 mg/dL (ref 70–99)
Glucose-Capillary: 116 mg/dL — ABNORMAL HIGH (ref 70–99)
Glucose-Capillary: 123 mg/dL — ABNORMAL HIGH (ref 70–99)
Glucose-Capillary: 98 mg/dL (ref 70–99)
Glucose-Capillary: 182 mg/dL — ABNORMAL HIGH (ref 70–99)
Glucose-Capillary: 222 mg/dL — ABNORMAL HIGH (ref 70–99)

## 2020-12-04 NOTE — Progress Notes (Signed)
Physical Therapy Treatment Patient Details Name: Emily Collins MRN: [id number] DOB: 07-17-1966 Today's Date: 12/04/2020    History of Present Illness Emily Collins is a 52yoF who comes [facility] on 11/29[facility]20 c Rt heel ulcer infection x 2 weeks. PMH: GERD, OA, asthma, HLD, HTN, depression, GAD, DM1, PVD, retinopathy, PND, CKD3. Pt went to OR with podiatry on 3/9 for I&D now WBAT with CAM rocker.    PT Comments    Pt in bed upon entry agreeable to session, pain well controlled. ModI bed mobility, transfers and AMB at supervision level, safe RW use, no LOB. Pt educated again on CAM rocker use, she is able to don without assistance. Gait distance progressed to 16f and pt able to AMB 4 stairs with 2 rails. PT has met all PT goals. WiIl continue to follow as pt's DC date is not yet determined.    Follow Up Recommendations  No PT follow up     Equipment Recommendations  None recommended by PT    Recommendations for Other Services       Precautions / Restrictions Precautions Precautions: Fall Restrictions Weight Bearing Restrictions: Yes RLE Weight Bearing: Weight bearing as tolerated Other Position/Activity Restrictions: CAM rocker on "at all times"; podiatry gives verbal consent for removal at night for comfort    Mobility  Bed Mobility Overal bed mobility: Modified Independent                  Transfers Overall transfer level: Modified independent Equipment used: Rolling walker (2 wheeled) Transfers: Sit to/from SOmnicareSit to Stand: Modified independent (Device/Increase time) Stand pivot transfers: Modified independent (Device/Increase time)          Ambulation/Gait Ambulation/Gait assistance: Supervision Gait Distance (Feet): 160 Feet Assistive device: Rolling walker (2 wheeled) Gait Pattern/deviations: WFL(Within Functional Limits)     General Gait Details: pain fairly stable; no imbalance, moderate fatigue   Stairs Stairs:  Yes Stairs assistance: Min guard Stair Management: Two rails;Step to pattern;Forwards Number of Stairs: 4 General stair comments: c CAM rocker; up c good, down c bad   Wheelchair Mobility    Modified Rankin (Stroke Patients Only)       Balance Overall balance assessment: Modified Independent                                          Cognition Arousal/Alertness: Awake/alert Behavior During Therapy: WFL for tasks assessed/performed Overall Cognitive Status: Within Functional Limits for tasks assessed                                        Exercises      General Comments        Pertinent Vitals/Pain Pain Assessment: Faces Faces Pain Scale: Hurts little more Pain Location: Right surgical foot Pain Descriptors / Indicators: Aching Pain Intervention(s): Limited activity within patient's tolerance;Monitored during session;Premedicated before session    Home Living                      Prior Function            PT Goals (current goals can now be found in the care plan section) Acute Rehab PT Goals Patient Stated Goal: return to home PT Goal Formulation: With patient Time For Goal Achievement: 12/17/20  Potential to Achieve Goals: Good Progress towards PT goals: Progressing toward goals    Frequency    Min 2X/week      PT Plan Current plan remains appropriate    Co-evaluation              AM-PAC PT "6 Clicks" Mobility   Outcome Measure  Help needed turning from your back to your side while in a flat bed without using bedrails?: None Help needed moving from lying on your back to sitting on the side of a flat bed without using bedrails?: None Help needed moving to and from a bed to a chair (including a wheelchair)?: None Help needed standing up from a chair using your arms (e.g., wheelchair or bedside chair)?: None Help needed to walk in hospital room?: A Little Help needed climbing 3-5 steps with a railing?  : A Little 6 Click Score: 22    End of Session Equipment Utilized During Treatment: Gait belt Activity Tolerance: Patient tolerated treatment well;No increased pain Patient left: in chair;with call bell/phone within reach;Other (comment) Nurse Communication: Mobility status PT Visit Diagnosis: Difficulty in walking, not elsewhere classified (R26.2);Other symptoms and signs involving the nervous system (R29.898)     Time: 0026-2854 PT Time Calculation (min) (ACUTE ONLY): 30 min  Charges:  $Gait Training: 23-37 mins                     12:04 PM, 12/04/20 Etta Grandchild, PT, DPT Physical Therapist - [facility]  [phone] ([place])    [place] 12/04/2020, 12:02 PM

## 2020-12-04 NOTE — Progress Notes (Signed)
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 Wound cx shows NGTD. Continue IV vanco & ceftriaxone, & will narrow spectrum as per cx results. May have to go back to OR for another I&D/washout as per podiatry   AKI on CKDIIIa: Cr is labile. Will continue to monitor   Hyperkalemia: resolved   DM2: poorly controlled w/ HbA1c 10.2. Continue on lantus, SSI w/ accuchecks   Depression: severity unknown. Continue on home dose of fluoxetine   Morbid obesity: BMI 41.8. Complicates overall care and prognosis   Thrombocytosis: likely reactive. Will continue to monitor   Leukocytosis: continue on IV abxs. Trending down today   Likely ACD: s/p 1 unit of pRBCs transfused. H&H are stable   Hyponatremia: resolved   DVT prophylaxis: lovenox  Code Status: full  Family Communication:  Disposition Plan: likely home w/ home health   Level of care: Med-Surg   Status is: Inpatient  Remains inpatient appropriate because:Ongoing diagnostic testing needed not appropriate for outpatient work up, Unsafe d/c plan, IV treatments appropriate due to intensity of illness or inability to take PO and Inpatient level of care appropriate due to severity of illness   Dispo: The patient is from: Home              Anticipated d/c is to: Home              Patient currently is not medically stable to d/c.   Difficult to place patient Yes       Consultants:      Procedures:    Antimicrobials: vanco, ceftriaxone   Subjective: Pt c/o malaise    Objective: Vitals:   12/03/20 1536 12/03/20 1939 12/04/20 0124 12/04/20 0423  BP: (!) 112/55 (!) 126/55 (!) 111/54 125/69  Pulse: 86 87 93 91  Resp: 17 18 20    Temp: 97.7 F (36.5 C) 98.3 F (36.8 C) 98.1 F (36.7 C) 98.3 F (36.8 C)  TempSrc: Oral Oral Oral Oral  SpO2: 95% 96% 100% 91%  Weight:      Height:       No intake or output data in the 24 hours ending 12/04/20 0731 Filed Weights   11/29/20 1301 11/30/20 0353  Weight: 104 kg 103.8 kg    Examination:  General exam: Appears lethargic  Respiratory system: clear breath sounds b/l  Cardiovascular system: S1/S2+. No clicks or rubs   Gastrointestinal system: Abd is soft, NT, obese & hypoactive bowel sounds  Central nervous system: Alert and oriented. Moves all 4 extremities  Skin: right foot is dressed and dressing is C/D/I  Psychiatry: Judgement and insight appear normal. Flat mood and affect    Data Reviewed: I have personally reviewed following labs and imaging studies  CBC: Recent Labs  Lab 11/29/20 1308 11/29/20 2258 11/30/20 0432 12/01/20 0509 12/01/20 1821 12/02/20 0502 12/03/20 0412 12/04/20 0410  WBC 15.6*   < > 14.7* 15.9*  --  16.9* 17.0* 14.2*  NEUTROABS 12.0*  --   --  11.2*  --   --   --   --  HGB 8.6*   < > 8.2* 7.6* 10.2* 8.9* 8.7* 8.7*  HCT 27.0*   < > 26.1* 24.9* 30.0* 28.0* 28.1* 28.2*  MCV 85.2   < > 86.7 87.4  --  85.9 87.5 89.0  PLT 520*   < > 517* 558*  --  568* 622* 634*   < > = values in this interval not displayed.   Basic Metabolic Panel: Recent Labs  Lab 11/29/20 2258 11/30/20 0432 12/01/20 0509 12/01/20 1821 12/02/20 0659 12/02/20 1213 12/03/20 0412 12/04/20 0410  NA 134* 135 133* 135 131*  --  137 138  K 5.0 5.2* 5.4* 5.2* 6.9* 5.3* 4.8 4.7  CL 105 106 105 103 104  --  106 108  CO2 21* 20* 21*  --  19*  --  22 22  GLUCOSE 94 176* 198* 59* 413*  --  117* 43*  BUN 26* 27* 34* 30* 35*  --  40* 35*  CREATININE 1.65* 1.73* 1.78* 1.70* 1.61*  --  1.93* 1.63*   CALCIUM 8.3* 8.2* 8.2*  --  7.9*  --  8.5* 8.3*  MG 2.6*  --   --   --   --   --   --   --   PHOS 4.4  --   --   --   --   --   --   --    GFR: Estimated Creatinine Clearance: 44.1 mL/min (A) (by C-G formula based on SCr of 1.63 mg/dL (H)). Liver Function Tests: Recent Labs  Lab 11/29/20 1308 11/30/20 0432  AST 28 21  ALT 24 21  ALKPHOS 117 105  BILITOT 0.4 0.6  PROT 8.1 7.5  ALBUMIN 3.1* 2.8*   No results for input(s): LIPASE, AMYLASE in the last 168 hours. No results for input(s): AMMONIA in the last 168 hours. Coagulation Profile: No results for input(s): INR, PROTIME in the last 168 hours. Cardiac Enzymes: No results for input(s): CKTOTAL, CKMB, CKMBINDEX, TROPONINI in the last 168 hours. BNP (last 3 results) No results for input(s): PROBNP in the last 8760 hours. HbA1C: No results for input(s): HGBA1C in the last 72 hours. CBG: Recent Labs  Lab 12/03/20 1942 12/03/20 2319 12/04/20 0118 12/04/20 0427 12/04/20 0631  GLUCAP 61* 68* 53* 44* 98   Lipid Profile: No results for input(s): CHOL, HDL, LDLCALC, TRIG, CHOLHDL, LDLDIRECT in the last 72 hours. Thyroid Function Tests: No results for input(s): TSH, T4TOTAL, FREET4, T3FREE, THYROIDAB in the last 72 hours. Anemia Panel: No results for input(s): VITAMINB12, FOLATE, FERRITIN, TIBC, IRON, RETICCTPCT in the last 72 hours. Sepsis Labs: Recent Labs  Lab 11/29/20 1308  LATICACIDVEN 1.2    Recent Results (from the past 240 hour(s))  SARS CORONAVIRUS 2 (TAT 6-24 HRS) Nasopharyngeal Nasopharyngeal Swab     Status: None   Collection Time: 11/29/20  4:20 PM   Specimen: Nasopharyngeal Swab  Result Value Ref Range Status   SARS Coronavirus 2 NEGATIVE NEGATIVE Final    Comment: (NOTE) SARS-CoV-2 target nucleic acids are NOT DETECTED.  The SARS-CoV-2 RNA is generally detectable in upper and lower respiratory specimens during the acute phase of infection. Negative results do not preclude SARS-CoV-2 infection, do not  rule out co-infections with other pathogens, and should not be used as the sole basis for treatment or other patient management decisions. Negative results must be combined with clinical observations, patient history, and epidemiological information. The expected result is Negative.  Fact Sheet for Patients: HairSlick.no  Fact Sheet for Healthcare Providers:  quierodirigir.com  This test is not yet approved or cleared by the [place] and  has been authorized for detection and/or diagnosis of SARS-CoV-2 by FDA under an Emergency Use Authorization (EUA). This EUA will remain  in effect (meaning this test can be used) for the duration of the COVID-19 declaration under Se ction 564(b)(1) of the Act, 21 U.S.C. section 360bbb-3(b)(1), unless the authorization is terminated or revoked sooner.  Performed at [facility], [address], [place], [place] [address]   Aerobic/Anaerobic Culture w Gram Stain (surgical/deep wound)     Status: None (Preliminary result)   Collection Time: 12/01/20  6:56 PM   Specimen: Wound  Result Value Ref Range Status   Specimen Description   Final    ABSCESS RIGHT ANKLE CULTURE Performed at [facility], [address] [place], [place] [address]    Special Requests   Final    NONE Performed at [facility], [address] [place], [place] [address]    Gram Stain   Final    ABUNDANT WBC PRESENT, PREDOMINANTLY PMN NO ORGANISMS SEEN    Culture   Final    NO GROWTH 1 DAY Performed at [facility], [address] [place], [place] [address]    Report Status PENDING  Incomplete  MRSA PCR Screening     Status: None   Collection Time: 12/03/20  3:18 PM   Specimen: Nasopharyngeal  Result Value Ref Range Status   MRSA by PCR NEGATIVE NEGATIVE Final    Comment:        The GeneXpert MRSA Assay (FDA approved for NASAL specimens only), is one component of  a comprehensive MRSA colonization surveillance program. It is not intended to diagnose MRSA infection nor to guide or monitor treatment for MRSA infections. Performed at [facility], [address], [place], [place] [address]          Radiology Studies: No results found.      Scheduled Meds: . amitriptyline  25 mg Oral QHS  . aspirin EC  81 mg Oral Daily  . atorvastatin  40 mg Oral Daily  . calcium carbonate  1 tablet Oral BID WC  . docusate sodium  100 mg Oral BID  . enoxaparin (LOVENOX) injection  0.5 mg/kg Subcutaneous Q24H  . famotidine  40 mg Oral Daily  . FLUoxetine  40 mg Oral Daily  . insulin aspart  0-20 Units Subcutaneous TID WC  . insulin aspart  0-5 Units Subcutaneous QHS  . insulin glargine  30 Units Subcutaneous QHS  . insulin glargine  50 Units Subcutaneous Daily  . ipratropium  2 puff Inhalation Q4H  . loratadine  10 mg Oral Daily  . montelukast  10 mg Oral QHS  . pantoprazole  40 mg Oral Daily  . pregabalin  150 mg Oral Daily   Continuous Infusions: . sodium chloride 50 mL/hr at 12/04/20 0325  . cefTRIAXone (ROCEPHIN)  IV 2 g (12/03/20 1456)  . vancomycin 750 mg (12/03/20 1409)     LOS: 5 days    Time spent: 30 mins     Charise Killian, MD [facility] Pager [phone]  If 7PM-7AM, please contact night-coverage 12/04/2020, 7:31 AM

## 2020-12-05 DIAGNOSIS — L02619 Cutaneous abscess of unspecified foot: Secondary | ICD-10-CM | POA: Diagnosis not present

## 2020-12-05 DIAGNOSIS — L03119 Cellulitis of unspecified part of limb: Secondary | ICD-10-CM | POA: Diagnosis not present

## 2020-12-05 DIAGNOSIS — E1165 Type 2 diabetes mellitus with hyperglycemia: Secondary | ICD-10-CM | POA: Diagnosis not present

## 2020-12-05 DIAGNOSIS — N179 Acute kidney failure, unspecified: Secondary | ICD-10-CM | POA: Diagnosis not present

## 2020-12-05 LAB — CBC
HCT: 28.3 % — ABNORMAL LOW (ref 36.0–46.0)
Hemoglobin: 8.7 g/dL — ABNORMAL LOW (ref 12.0–15.0)
MCH: 27.5 pg (ref 26.0–34.0)
MCHC: 30.7 g/dL (ref 30.0–36.0)
MCV: 89.6 fL (ref 80.0–100.0)
Platelets: 591 10*3/uL — ABNORMAL HIGH (ref 150–400)
RBC: 3.16 MIL/uL — ABNORMAL LOW (ref 3.87–5.11)
RDW: 15.9 % — ABNORMAL HIGH (ref 11.5–15.5)
WBC: 12 10*3/uL — ABNORMAL HIGH (ref 4.0–10.5)
nRBC: 0 % (ref 0.0–0.2)

## 2020-12-05 LAB — BASIC METABOLIC PANEL
Anion gap: 6 (ref 5–15)
BUN: 30 mg/dL — ABNORMAL HIGH (ref 6–20)
CO2: 22 mmol/L (ref 22–32)
Calcium: 8.1 mg/dL — ABNORMAL LOW (ref 8.9–10.3)
Chloride: 108 mmol/L (ref 98–111)
Creatinine, Ser: 1.33 mg/dL — ABNORMAL HIGH (ref 0.44–1.00)
GFR, Estimated: 47 mL/min — ABNORMAL LOW (ref >60)
Glucose, Bld: 162 mg/dL — ABNORMAL HIGH (ref 70–99)
Potassium: 4.8 mmol/L (ref 3.5–5.1)
Sodium: 136 mmol/L (ref 135–145)

## 2020-12-05 LAB — GLUCOSE, CAPILLARY
Glucose-Capillary: 198 mg/dL — ABNORMAL HIGH (ref 70–99)
Glucose-Capillary: 131 mg/dL — ABNORMAL HIGH (ref 70–99)
Glucose-Capillary: 149 mg/dL — ABNORMAL HIGH (ref 70–99)
Glucose-Capillary: 137 mg/dL — ABNORMAL HIGH (ref 70–99)
Glucose-Capillary: 66 mg/dL — ABNORMAL LOW (ref 70–99)
Glucose-Capillary: 86 mg/dL (ref 70–99)

## 2020-12-05 NOTE — Progress Notes (Signed)
PODIATRY PROGRESS NOTE  NAME Parrie Rasco MRN [id number] DOB 09/18/1966 DOA 11/29/2020   Chief Complaint  Patient presents with  . Leg Pain    History of present illness: 55 y.o. female S/P I&D RT foot and ankle.  DOS: 12/01/2020.  Approximately 20 mL purulent drainage drained from the anterior aspect of the ankle through 2 incision sites, according to surgeon Dr. Lilian Kapur DPM.  Patient resting in bed comfortably.  Patient states that over the last 48 hours she has noted significant improvement in regards to the intermittent sharp shooting pains.  She no longer experiences sharp stabbing pains.  There is some achiness and pain associated to dorsiflexion of the ankle joint.  Overall her significant improvement she states.  No other acute complaints at this time.  She denies nausea vomiting fever chills shortness of breath or chest pain.  Past Medical History:  Diagnosis Date  . Allergy   . Anxiety   . Arthritis   . Asthma   . Depression   . Diabetes mellitus without complication (HCC)   . GERD (gastroesophageal reflux disease)   . History of chicken pox 04/07/2015   DID have Chicken Pox.    . Hyperlipidemia   . Hypertension   . Neuromuscular disorder (HCC)   . Venous (peripheral) insufficiency     CBC Latest Ref Rng & Units 12/05/2020 12/04/2020 12/03/2020  WBC 4.0 - 10.5 K/uL 12.0(H) 14.2(H) 17.0(H)  Hemoglobin 12.0 - 15.0 g/dL 5.4(Y) 5.0(P) 5.4(S)  Hematocrit 36.0 - 46.0 % 28.3(L) 28.2(L) 28.1(L)  Platelets 150 - 400 K/uL 591(H) 634(H) 622(H)    BMP Latest Ref Rng & Units 12/05/2020 12/04/2020 12/03/2020  Glucose 70 - 99 mg/dL 568(L) 27(NT) 700(F)  BUN 6 - 20 mg/dL 74(B) 44(H) 67(R)  Creatinine 0.44 - 1.00 mg/dL 9.16(B) 8.46(K) 5.99(J)  BUN/Creat Ratio 9 - 23 - - -  Sodium 135 - 145 mmol/L 136 138 137  Potassium 3.5 - 5.1 mmol/L 4.8 4.7 4.8  Chloride 98 - 111 mmol/L 108 108 106  CO2 22 - 32 mmol/L 22 22 22   Calcium 8.9 - 10.3 mg/dL 8.1(L) 8.3(L) 8.5(L)        Objective/Physical Exam Neurovascular status intact.  Skin incisions appear to be well coapted with sutures and intact.  Packing removed from the distal portion of the incision sites.  No active bleeding noted.   Erythema and edema continue to improve.  There is a reduction of the swelling and erythema overall  Assessment/plan of care: 1.  S/P I&D RT ankle.  DOS: 12/01/2020 -Last labs taken 420 AM this morning WBC has improved over the last 48 hours.  Currently 12.0. -Continue daily dressing change orders -Continue weightbearing in the cam boot as tolerated -Intraoperative cultures.  NO GROWTH 3 DAYS. PENDING.  -At the moment it appears that the patient's labs and clinical appearance of the ankle are improving.  With the progress so far I do not foresee the need for return to the OR.  If WBC improves I believe the patient would be okay to be discharged on oral antibiotics perhaps Monday or Tuesday. -Podiatry will continue to follow.   Please contact me directly with any questions or concerns.  Cell [phone]   [phone], DPM [facility]  Dr. [name], DPM    [name]  [place], [place] [address]                Office [phone]  Fax [phone]

## 2020-12-05 NOTE — Progress Notes (Signed)
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 Wound cx still show NGTD. Continue IV ceftriaxone & d/c vanco. May have to go back to OR for another I&D/washout as per podiatry   AKI on CKDIIIa: Cr is trending down from day prior. Avoid nephrotoxic meds  Hyperkalemia: resolved   DM2: HbA1c 10.2, poorly controlled. Continue on lantus, SSI w/ accuchecks   Depression: severity unknown. Continue on home dose of fluoxetine   Morbid obesity: BMI 41.8. Complicates overall care and prognosis   Thrombocytosis: likely reactive. Will continue to monitor   Leukocytosis: continues to trend down. Continue on IV abxs   Likely ACD: s/p 1 unit of pRBCs transfused. H&H are stable   Hyponatremia: resolved   DVT prophylaxis: lovenox  Code Status: full  Family Communication: discussed pt's care w/ pt's fam at bedside and answered their questions Disposition Plan: likely home w/ home health   Level of care: Med-Surg   Status is: Inpatient  Remains inpatient appropriate because:Ongoing diagnostic testing needed not appropriate for outpatient work up, Unsafe d/c plan, IV treatments appropriate due to intensity of illness or inability to take PO and Inpatient level of care appropriate due to severity of illness   Dispo: The patient is from: Home              Anticipated d/c is to: Home              Patient currently is not medically stable to d/c.   Difficult to place patient Yes       Consultants:      Procedures:    Antimicrobials:  ceftriaxone   Subjective: Pt c/o fatigue    Objective: Vitals:   12/04/20 1510 12/04/20 1943 12/05/20 0050 12/05/20 0433  BP: 119/67 125/61 128/72 124/63  Pulse: 81 83 79 75  Resp: 17 17 18 17   Temp: 97.7 F (36.5 C) 98.1 F (36.7 C) 97.9 F (36.6 C) 97.8 F (36.6 C)  TempSrc:   Oral Oral  SpO2: 94% 93% 94% 95%  Weight:      Height:        Intake/Output Summary (Last 24 hours) at 12/05/2020 0728 Last data filed at 12/04/2020 1033 Gross per 24 hour  Intake 240 ml  Output --  Net 240 ml   Filed Weights   11/29/20 1301 11/30/20 0353  Weight: 104 kg 103.8 kg    Examination:  General exam: Appears comfortable  Respiratory system: clear breath sounds b/l  Cardiovascular system: S1 &S2+. No rubs or gallops Gastrointestinal system: Abd is soft, obese, NT, & hypoactive bowel sounds Central nervous system: Alert and oriented. Moves all 4 extremities Skin: right foot is dressed and dressing is C/D/I   Psychiatry: Judgement and insight appear normal. Flat mood and affect    Data Reviewed: I have personally reviewed following labs and imaging studies  CBC: Recent Labs  Lab 11/29/20 1308 11/29/20 2258 12/01/20 0509 12/01/20 1821 12/02/20 0502 12/03/20 0412 12/04/20 0410 12/05/20 0420  WBC 15.6*   < >  15.9*  --  16.9* 17.0* 14.2* 12.0*  NEUTROABS 12.0*  --  11.2*  --   --   --   --   --   HGB 8.6*   < > 7.6* 10.2* 8.9* 8.7* 8.7* 8.7*  HCT 27.0*   < > 24.9* 30.0* 28.0* 28.1* 28.2* 28.3*  MCV 85.2   < > 87.4  --  85.9 87.5 89.0 89.6  PLT 520*   < > 558*  --  568* 622* 634* 591*   < > = values in this interval not displayed.   Basic Metabolic Panel: Recent Labs  Lab 11/29/20 2258 11/30/20 0432 12/01/20 0509 12/01/20 1821 12/02/20 0659 12/02/20 1213 12/03/20 0412 12/04/20 0410 12/05/20 0420  NA 134*   < > 133* 135 131*  --  137 138 136  K 5.0   < > 5.4* 5.2* 6.9* 5.3* 4.8 4.7 4.8  CL 105   < > 105 103 104  --  106 108 108  CO2 21*   < > 21*  --  19*  --   22 22 22   GLUCOSE 94   < > 198* 59* 413*  --  117* 43* 162*  BUN 26*   < > 34* 30* 35*  --  40* 35* 30*  CREATININE 1.65*   < > 1.78* 1.70* 1.61*  --  1.93* 1.63* 1.33*  CALCIUM 8.3*   < > 8.2*  --  7.9*  --  8.5* 8.3* 8.1*  MG 2.6*  --   --   --   --   --   --   --   --   PHOS 4.4  --   --   --   --   --   --   --   --    < > = values in this interval not displayed.   GFR: Estimated Creatinine Clearance: 54 mL/min (A) (by C-G formula based on SCr of 1.33 mg/dL (H)). Liver Function Tests: Recent Labs  Lab 11/29/20 1308 11/30/20 0432  AST 28 21  ALT 24 21  ALKPHOS 117 105  BILITOT 0.4 0.6  PROT 8.1 7.5  ALBUMIN 3.1* 2.8*   No results for input(s): LIPASE, AMYLASE in the last 168 hours. No results for input(s): AMMONIA in the last 168 hours. Coagulation Profile: No results for input(s): INR, PROTIME in the last 168 hours. Cardiac Enzymes: No results for input(s): CKTOTAL, CKMB, CKMBINDEX, TROPONINI in the last 168 hours. BNP (last 3 results) No results for input(s): PROBNP in the last 8760 hours. HbA1C: No results for input(s): HGBA1C in the last 72 hours. CBG: Recent Labs  Lab 12/04/20 1646 12/04/20 1958 12/04/20 2303 12/05/20 0108 12/05/20 0507  GLUCAP 98 182* 222* 198* 131*   Lipid Profile: No results for input(s): CHOL, HDL, LDLCALC, TRIG, CHOLHDL, LDLDIRECT in the last 72 hours. Thyroid Function Tests: No results for input(s): TSH, T4TOTAL, FREET4, T3FREE, THYROIDAB in the last 72 hours. Anemia Panel: No results for input(s): VITAMINB12, FOLATE, FERRITIN, TIBC, IRON, RETICCTPCT in the last 72 hours. Sepsis Labs: Recent Labs  Lab 11/29/20 1308  LATICACIDVEN 1.2    Recent Results (from the past 240 hour(s))  SARS CORONAVIRUS 2 (TAT 6-24 HRS) Nasopharyngeal Nasopharyngeal Swab     Status: None   Collection Time: 11/29/20  4:20 PM   Specimen: Nasopharyngeal Swab  Result Value Ref Range Status   SARS Coronavirus 2 NEGATIVE NEGATIVE Final    Comment:  (NOTE) SARS-CoV-2 target nucleic acids are  NOT DETECTED.  The SARS-CoV-2 RNA is generally detectable in upper and lower respiratory specimens during the acute phase of infection. Negative results do not preclude SARS-CoV-2 infection, do not rule out co-infections with other pathogens, and should not be used as the sole basis for treatment or other patient management decisions. Negative results must be combined with clinical observations, patient history, and epidemiological information. The expected result is Negative.  Fact Sheet for Patients: HairSlick.no  Fact Sheet for Healthcare Providers: quierodirigir.com  This test is not yet approved or cleared by the [place] FDA and  has been authorized for detection and/or diagnosis of SARS-CoV-2 by FDA under an Emergency Use Authorization (EUA). This EUA will remain  in effect (meaning this test can be used) for the duration of the COVID-19 declaration under Se ction 564(b)(1) of the Act, 21 U.S.C. section 360bbb-3(b)(1), unless the authorization is terminated or revoked sooner.  Performed at [facility], [address] [place], [place] [address]   Aerobic/Anaerobic Culture w Gram Stain (surgical/deep wound)     Status: None (Preliminary result)   Collection Time: 12/01/20  6:56 PM   Specimen: Wound  Result Value Ref Range Status   Specimen Description   Final    ABSCESS RIGHT ANKLE CULTURE Performed at [facility], [address], [place], [place] [address]    Special Requests   Final    NONE Performed at [facility], [address] [place], [place] [address]    Gram Stain   Final    ABUNDANT WBC PRESENT, PREDOMINANTLY PMN NO ORGANISMS SEEN    Culture   Final    NO GROWTH 2 DAYS Performed at [facility], [address] [place], [place] [address]    Report Status PENDING  Incomplete  MRSA PCR Screening     Status: None    Collection Time: 12/03/20  3:18 PM   Specimen: Nasopharyngeal  Result Value Ref Range Status   MRSA by PCR NEGATIVE NEGATIVE Final    Comment:        The GeneXpert MRSA Assay (FDA approved for NASAL specimens only), is one component of a comprehensive MRSA colonization surveillance program. It is not intended to diagnose MRSA infection nor to guide or monitor treatment for MRSA infections. Performed at [facility], [address], [place], [place] [address]          Radiology Studies: No results found.      Scheduled Meds: . amitriptyline  25 mg Oral QHS  . aspirin EC  81 mg Oral Daily  . atorvastatin  40 mg Oral Daily  . calcium carbonate  1 tablet Oral BID WC  . docusate sodium  100 mg Oral BID  . enoxaparin (LOVENOX) injection  0.5 mg/kg Subcutaneous Q24H  . famotidine  40 mg Oral Daily  . FLUoxetine  40 mg Oral Daily  . insulin aspart  0-20 Units Subcutaneous TID WC  . insulin aspart  0-5 Units Subcutaneous QHS  . insulin glargine  30 Units Subcutaneous QHS  . insulin glargine  50 Units Subcutaneous Daily  . ipratropium  2 puff Inhalation Q4H  . loratadine  10 mg Oral Daily  . montelukast  10 mg Oral QHS  . pantoprazole  40 mg Oral Daily  . pregabalin  150 mg Oral Daily   Continuous Infusions: . sodium chloride 50 mL/hr at 12/05/20 0000  . cefTRIAXone (ROCEPHIN)  IV 2 g (12/04/20 1745)  . vancomycin 750 mg (12/04/20  1531)     LOS: 6 days    Time spent: 31 mins     Charise Killian, MD [facility] Pager [phone]  If 7PM-7AM, please contact night-coverage 12/05/2020, 7:28 AM

## 2020-12-05 NOTE — H&P (View-Only) (Signed)
 PODIATRY PROGRESS NOTE  NAME Emily Collins MRN [id number] DOB 05/12/1966 DOA 11/29/2020   Chief Complaint  Patient presents with  . Leg Pain    History of present illness: 54 y.o. female S/P I&D RT foot and ankle.  DOS: 12/01/2020.  Approximately 20 mL purulent drainage drained from the anterior aspect of the ankle through 2 incision sites, according to surgeon Dr. McDonald DPM.  Patient resting in bed comfortably.  Patient states that over the last 48 hours she has noted significant improvement in regards to the intermittent sharp shooting pains.  She no longer experiences sharp stabbing pains.  There is some achiness and pain associated to dorsiflexion of the ankle joint.  Overall her significant improvement she states.  No other acute complaints at this time.  She denies nausea vomiting fever chills shortness of breath or chest pain.  Past Medical History:  Diagnosis Date  . Allergy   . Anxiety   . Arthritis   . Asthma   . Depression   . Diabetes mellitus without complication (HCC)   . GERD (gastroesophageal reflux disease)   . History of chicken pox 04/07/2015   DID have Chicken Pox.    . Hyperlipidemia   . Hypertension   . Neuromuscular disorder (HCC)   . Venous (peripheral) insufficiency     CBC Latest Ref Rng & Units 12/05/2020 12/04/2020 12/03/2020  WBC 4.0 - 10.5 K/uL 12.0(H) 14.2(H) 17.0(H)  Hemoglobin 12.0 - 15.0 g/dL 8.7(L) 8.7(L) 8.7(L)  Hematocrit 36.0 - 46.0 % 28.3(L) 28.2(L) 28.1(L)  Platelets 150 - 400 K/uL 591(H) 634(H) 622(H)    BMP Latest Ref Rng & Units 12/05/2020 12/04/2020 12/03/2020  Glucose 70 - 99 mg/dL 162(H) 43(LL) 117(H)  BUN 6 - 20 mg/dL 30(H) 35(H) 40(H)  Creatinine 0.44 - 1.00 mg/dL 1.33(H) 1.63(H) 1.93(H)  BUN/Creat Ratio 9 - 23 - - -  Sodium 135 - 145 mmol/L 136 138 137  Potassium 3.5 - 5.1 mmol/L 4.8 4.7 4.8  Chloride 98 - 111 mmol/L 108 108 106  CO2 22 - 32 mmol/L 22 22 22  Calcium 8.9 - 10.3 mg/dL 8.1(L) 8.3(L) 8.5(L)        Objective/Physical Exam Neurovascular status intact.  Skin incisions appear to be well coapted with sutures and intact.  Packing removed from the distal portion of the incision sites.  No active bleeding noted.   Erythema and edema continue to improve.  There is a reduction of the swelling and erythema overall  Assessment/plan of care: 1.  S/P I&D RT ankle.  DOS: 12/01/2020 -Last labs taken 420 AM this morning WBC has improved over the last 48 hours.  Currently 12.0. -Continue daily dressing change orders -Continue weightbearing in the cam boot as tolerated -Intraoperative cultures.  NO GROWTH 3 DAYS. PENDING.  -At the moment it appears that the patient's labs and clinical appearance of the ankle are improving.  With the progress so far I do not foresee the need for return to the OR.  If WBC improves I believe the patient would be okay to be discharged on oral antibiotics perhaps Monday or Tuesday. -Podiatry will continue to follow.   Please contact me directly with any questions or concerns.  Cell [phone]   Meryle Pugmire M. Lainee Lehrman, DPM [facility]  Dr. Carely Nappier M. Coleson Kant, DPM    [address]                                          [place], [place] [address]                Office [phone]  Fax [phone]

## 2020-12-06 DIAGNOSIS — E1165 Type 2 diabetes mellitus with hyperglycemia: Secondary | ICD-10-CM | POA: Diagnosis not present

## 2020-12-06 DIAGNOSIS — L02619 Cutaneous abscess of unspecified foot: Secondary | ICD-10-CM | POA: Diagnosis not present

## 2020-12-06 DIAGNOSIS — N179 Acute kidney failure, unspecified: Secondary | ICD-10-CM | POA: Diagnosis not present

## 2020-12-06 DIAGNOSIS — L03119 Cellulitis of unspecified part of limb: Secondary | ICD-10-CM | POA: Diagnosis not present

## 2020-12-06 LAB — BASIC METABOLIC PANEL
Anion gap: 6 (ref 5–15)
BUN: 24 mg/dL — ABNORMAL HIGH (ref 6–20)
CO2: 22 mmol/L (ref 22–32)
Calcium: 7.9 mg/dL — ABNORMAL LOW (ref 8.9–10.3)
Chloride: 109 mmol/L (ref 98–111)
Creatinine, Ser: 1.17 mg/dL — ABNORMAL HIGH (ref 0.44–1.00)
GFR, Estimated: 55 mL/min — ABNORMAL LOW (ref >60)
Glucose, Bld: 75 mg/dL (ref 70–99)
Potassium: 4.6 mmol/L (ref 3.5–5.1)
Sodium: 137 mmol/L (ref 135–145)

## 2020-12-06 LAB — GLUCOSE, CAPILLARY
Glucose-Capillary: 67 mg/dL — ABNORMAL LOW (ref 70–99)
Glucose-Capillary: 74 mg/dL (ref 70–99)
Glucose-Capillary: 97 mg/dL (ref 70–99)
Glucose-Capillary: 116 mg/dL — ABNORMAL HIGH (ref 70–99)
Glucose-Capillary: 106 mg/dL — ABNORMAL HIGH (ref 70–99)
Glucose-Capillary: 152 mg/dL — ABNORMAL HIGH (ref 70–99)

## 2020-12-06 LAB — CBC
HCT: 27.5 % — ABNORMAL LOW (ref 36.0–46.0)
Hemoglobin: 8.4 g/dL — ABNORMAL LOW (ref 12.0–15.0)
MCH: 27.5 pg (ref 26.0–34.0)
MCHC: 30.5 g/dL (ref 30.0–36.0)
MCV: 89.9 fL (ref 80.0–100.0)
Platelets: 573 10*3/uL — ABNORMAL HIGH (ref 150–400)
RBC: 3.06 MIL/uL — ABNORMAL LOW (ref 3.87–5.11)
RDW: 15.8 % — ABNORMAL HIGH (ref 11.5–15.5)
WBC: 12.5 10*3/uL — ABNORMAL HIGH (ref 4.0–10.5)
nRBC: 0 % (ref 0.0–0.2)

## 2020-12-06 MED ORDER — CHLORHEXIDINE GLUCONATE CLOTH 2 % EX PADS: 6.0000 | MEDICATED_PAD | Freq: Once | CUTANEOUS | Status: DC

## 2020-12-06 MED ORDER — CHLORHEXIDINE GLUCONATE CLOTH 2 % EX PADS
6.0000 | MEDICATED_PAD | Freq: Once | CUTANEOUS | Status: AC
  Administered 2020-12-07: 6 via TOPICAL

## 2020-12-06 NOTE — Progress Notes (Signed)
Subjective: 55 year old female patient presents with right foot leg cellulitis with diabetic foot ulceration present with an I&D of the right leg and foot on 12/02/2018 with Dr. Lilian Kapur.  She is continue IV antibiotics.  Her white blood cell count is still 12.5 today.  She is still having some discomfort of the ankle.  She is describing the Betadine is irritating her skin and wants that the pain is changed.  Currently denies any fevers, chills, nausea, vomiting, chest pain, shortness of breath.  Objective: AAO x3, NAD DP/PT pulses palpable bilaterally, CRT less than 3 seconds There is still edema to the distal leg into the foot on the right side.  Incisions are now longer packed and there is some fibrotic tissue within the wound.  There is no purulence identified there is no area fluctuation crepitation.  Slight warmth of the ankle.  He does have discomfort with dorsiflexion of the ankle.  Area of superficial necrosis along the medial incision.  There is no pain with calf compression or warmth to the leg.       Assessment: Cellulitis right foot, ankle  Plan: I would at this point she would benefit from a repeat debridement, I&D, washout.  We will plan on doing this tomorrow evening.  We will do n.p.o. after 8 AM tomorrow.  I discussed the surgical's postoperative course and likely Dr. Logan Bores will perform the surgery will discuss the case with his well.  We discussed risks of surgery including, not limited to, spread of infection, delayed or nonhealing, amputation as well as general risks of surgery.  She has no further questions or concerns today.  I applied Adaptic to the wound followed by dressing.  Encourage elevation. [facility] will continue to follow.   Ovid Curd, DPM

## 2020-12-06 NOTE — Progress Notes (Signed)
Inpatient Diabetes Program Recommendations  AACE/ADA: New Consensus Statement on Inpatient Glycemic Control (2015)  Target Ranges:  Prepandial:   less than 140 mg/dL      Peak postprandial:   less than 180 mg/dL (1-2 hours)      Critically ill patients:  140 - 180 mg/dL   Results for Emily Collins, Emily Collins (MRN [id number]) as of 12/06/2020 07:12  Ref. Range 12/03/2020 23:19 12/04/2020 01:18 12/04/2020 04:27 12/04/2020 06:31 12/04/2020 08:25 12/04/2020 11:51 12/04/2020 16:46 12/04/2020 19:58  Glucose-Capillary Latest Ref Range: 70 - 99 mg/dL 68 (L) 53 (L) 44 (LL) 98 116 (H) 123 (H)  3 units NOVOLOG  98 182 (H)   Results for Emily Collins, Emily Collins (MRN [id number]) as of 12/06/2020 07:12  Ref. Range 12/04/2020 23:03 12/05/2020 01:08 12/05/2020 05:07 12/05/2020 08:09 12/05/2020 12:04 12/05/2020 16:49 12/05/2020 19:44  Glucose-Capillary Latest Ref Range: 70 - 99 mg/dL 382 (H)  2 units NOVOLOG  30 units LANTUS 198 (H) 131 (H) 149 (H)  3 units NOVOLOG  50 units LANTUS  137 (H)  3 units NOVOLOG  66 (L) 86   Results for Emily Collins, Emily Collins (MRN [id number]) as of 12/06/2020 07:12  Ref. Range 12/05/2020 23:09 12/06/2020 05:17  Glucose-Capillary Latest Ref Range: 70 - 99 mg/dL 67 (L) 74    Home DM Meds:Humalog Inpen InPen settings include:Target blood glucose 100, Sensitivity 30, I:C ratio 10 Levemir 60 units AM/ 40 units PM Trulicity 3mg  Qweek  Current Orders:Lantus 50 units AM/ 30 units PM Novolog 0-20 units TID AC + HS    Note multiple Hypoglycemic events over the last several days.  Some Lantus doses given and some Held--see below:  PM dose (30 units Lantus) HELD the night of 03/10 NO Lantus given 03/11 PM dose (30 units Lantus) given 11pm on 03/12 AM dose (50 units Lantus) given 9am on 03/13 PM dose (30 units Lantus) HELD the night of 03/13   MD- Please consider the following:  1.  Stop Lantus 50 units Daily  2. Continue Lantus 30 units QHS (only giving total of 30 units Lantus per day would be about 0.3 units/kg)  3. Reduce Novolog SSi to the 0-15 unit scale (Moderate) TID AC + HS    --Will follow patient during hospitalization--  4/13 RN, MSN, CDE Diabetes Coordinator Inpatient Glycemic Control Team Team Pager: [phone] (8a-5p)

## 2020-12-06 NOTE — Progress Notes (Signed)
PT Cancellation Note  Patient Details Name: Emily Collins MRN: [id number] DOB: 01-10-1966   Cancelled Treatment:    Reason Eval/Treat Not Completed: Other (comment). Received patient at EOB. Pt hoping for dc this date. Reports she feels confident with all mobility including donning/doffing of CAM boot (observed in room). Discussed car transfers and technique. Family in room supportive. Pt has met all PT goals and safe to dc at this time. Will complete current order. If needs arise, please reach out. Thank you   Dmario Russom 12/06/2020, 11:31 AM  Greggory Stallion, PT, DPT [phone]

## 2020-12-06 NOTE — Progress Notes (Signed)
PROGRESS NOTE    Emily Collins  ZOX:[id number] DOB: 1966/05/14 DOA: 11/29/2020 PCP: Oswaldo ConroyBender, Abby Daneele, MD   Assessment & Plan:   Active Problems:   Cellulitis   Right leg pain   Ulcer of right heel and midfoot with fat layer exposed (HCC)   Abscess of right leg   Uncontrolled type 2 diabetes mellitus with hyperglycemia (HCC)   Cellulitis and abscess of foot, except toes  Right leg/foot cellulitis with right diabetic foot wound infection: s/p I&D of right leg & foot 12/01/20. Wound cx still NGTD. Continue on IV ceftriaxone. WBC trended up slightly today. May have to go back to the OR for another I&D/washout as per podiatry. HH will be set up for dressing changes   AKI on CKDIIIa: Cr continues to trend down   Hyperkalemia: resolved   DM2: poorly controlled, HbA1c 10.2. Continue on lantus, SSI w/ accuchecks   Depression: severity unknown. Continue on home dose of fluoxetine   Morbid obesity: BMI 41.8. Complicates overall care and prognosis   Thrombocytosis: etiology unclear, likely reactive.   Leukocytosis: labile. Continue on IV abxs   Likely ACD: s/p 1 unit of pRBCs transfused. H&H are labile   Hyponatremia: resolved   DVT prophylaxis: lovenox  Code Status: full  Family Communication:  Disposition Plan: likely home w/ home health; waiting on clearance from podiatry for d/c    Level of care: Med-Surg   Status is: Inpatient  Remains inpatient appropriate because:Ongoing diagnostic testing needed not appropriate for outpatient work up, Unsafe d/c plan, IV treatments appropriate due to intensity of illness or inability to take PO and Inpatient level of care appropriate due to severity of illness   Dispo: The patient is from: Home              Anticipated d/c is to: Home              Patient currently is not medically stable to d/c.   Difficult to place patient Yes       Consultants:      Procedures:    Antimicrobials:  ceftriaxone   Subjective: Pt c/o malaise     Objective: Vitals:   12/05/20 1938 12/05/20 2307 12/06/20 0513 12/06/20 0731  BP: (!) 143/68 (!) 141/75 (!) 132/58 115/65  Pulse: 86 77 84 79  Resp: 17 17 16 16   Temp: 97.9 F (36.6 C) 97.7 F (36.5 C) 98.1 F (36.7 C) 98.2 F (36.8 C)  TempSrc: Oral Oral Oral   SpO2: 94% 96% 98% 96%  Weight:      Height:        Intake/Output Summary (Last 24 hours) at 12/06/2020 0854 Last data filed at 12/05/2020 1852 Gross per 24 hour  Intake 480 ml  Output --  Net 480 ml   Filed Weights   11/29/20 1301 11/30/20 0353  Weight: 104 kg 103.8 kg    Examination:  General exam: Appears comfortable  Respiratory system: clear breath sounds b/l. No wheezes, rales, rhonchi  Cardiovascular system: S1/S2+. No rubs or clicks  Gastrointestinal system: Abd is soft, NT, ND & hypoactive bowel sounds  Central nervous system: Alert and oriented. Moves all 4 extremities  Skin: right foot is dressed and dressing is C/D/I   Psychiatry: Judgement and insight appear normal. Flat mood and affect    Data Reviewed: I have personally reviewed following labs and imaging studies  CBC: Recent Labs  Lab 11/29/20 1308 11/29/20 2258 12/01/20 0509 12/01/20 1821 12/02/20 0502 12/03/20 0412 12/04/20  0410 12/05/20 0420 12/06/20 0425  WBC 15.6*   < > 15.9*  --  16.9* 17.0* 14.2* 12.0* 12.5*  NEUTROABS 12.0*  --  11.2*  --   --   --   --   --   --   HGB 8.6*   < > 7.6*   < > 8.9* 8.7* 8.7* 8.7* 8.4*  HCT 27.0*   < > 24.9*   < > 28.0* 28.1* 28.2* 28.3* 27.5*  MCV 85.2   < > 87.4  --  85.9 87.5 89.0 89.6 89.9  PLT 520*   < > 558*  --  568* 622* 634* 591* 573*   < > = values in this interval not displayed.   Basic Metabolic Panel: Recent Labs  Lab 11/29/20 2258 11/30/20 0432 12/02/20 0659 12/02/20 1213 12/03/20 0412 12/04/20 0410 12/05/20 0420 12/06/20 0425  NA 134*   < > 131*  --  137 138 136 137  K 5.0   < > 6.9* 5.3* 4.8 4.7 4.8 4.6  CL 105   < >  104  --  106 108 108 109  CO2 21*   < > 19*  --  22 22 22 22   GLUCOSE 94   < > 413*  --  117* 43* 162* 75  BUN 26*   < > 35*  --  40* 35* 30* 24*  CREATININE 1.65*   < > 1.61*  --  1.93* 1.63* 1.33* 1.17*  CALCIUM 8.3*   < > 7.9*  --  8.5* 8.3* 8.1* 7.9*  MG 2.6*  --   --   --   --   --   --   --   PHOS 4.4  --   --   --   --   --   --   --    < > = values in this interval not displayed.   GFR: Estimated Creatinine Clearance: 61.4 mL/min (A) (by C-G formula based on SCr of 1.17 mg/dL (H)). Liver Function Tests: Recent Labs  Lab 11/29/20 1308 11/30/20 0432  AST 28 21  ALT 24 21  ALKPHOS 117 105  BILITOT 0.4 0.6  PROT 8.1 7.5  ALBUMIN 3.1* 2.8*   No results for input(s): LIPASE, AMYLASE in the last 168 hours. No results for input(s): AMMONIA in the last 168 hours. Coagulation Profile: No results for input(s): INR, PROTIME in the last 168 hours. Cardiac Enzymes: No results for input(s): CKTOTAL, CKMB, CKMBINDEX, TROPONINI in the last 168 hours. BNP (last 3 results) No results for input(s): PROBNP in the last 8760 hours. HbA1C: No results for input(s): HGBA1C in the last 72 hours. CBG: Recent Labs  Lab 12/05/20 1649 12/05/20 1944 12/05/20 2309 12/06/20 0517 12/06/20 0730  GLUCAP 66* 86 67* 74 97   Lipid Profile: No results for input(s): CHOL, HDL, LDLCALC, TRIG, CHOLHDL, LDLDIRECT in the last 72 hours. Thyroid Function Tests: No results for input(s): TSH, T4TOTAL, FREET4, T3FREE, THYROIDAB in the last 72 hours. Anemia Panel: No results for input(s): VITAMINB12, FOLATE, FERRITIN, TIBC, IRON, RETICCTPCT in the last 72 hours. Sepsis Labs: Recent Labs  Lab 11/29/20 1308  LATICACIDVEN 1.2    Recent Results (from the past 240 hour(s))  SARS CORONAVIRUS 2 (TAT 6-24 HRS) Nasopharyngeal Nasopharyngeal Swab     Status: None   Collection Time: 11/29/20  4:20 PM   Specimen: Nasopharyngeal Swab  Result Value Ref Range Status   SARS Coronavirus 2 NEGATIVE NEGATIVE Final     Comment: (  NOTE) SARS-CoV-2 target nucleic acids are NOT DETECTED.  The SARS-CoV-2 RNA is generally detectable in upper and lower respiratory specimens during the acute phase of infection. Negative results do not preclude SARS-CoV-2 infection, do not rule out co-infections with other pathogens, and should not be used as the sole basis for treatment or other patient management decisions. Negative results must be combined with clinical observations, patient history, and epidemiological information. The expected result is Negative.  Fact Sheet for Patients: HairSlick.no  Fact Sheet for Healthcare Providers: quierodirigir.com  This test is not yet approved or cleared by the [place] FDA and  has been authorized for detection and/or diagnosis of SARS-CoV-2 by FDA under an Emergency Use Authorization (EUA). This EUA will remain  in effect (meaning this test can be used) for the duration of the COVID-19 declaration under Se ction 564(b)(1) of the Act, 21 U.S.C. section 360bbb-3(b)(1), unless the authorization is terminated or revoked sooner.  Performed at [facility], [address] [place], [place] [address]   Aerobic/Anaerobic Culture w Gram Stain (surgical/deep wound)     Status: None (Preliminary result)   Collection Time: 12/01/20  6:56 PM   Specimen: Wound  Result Value Ref Range Status   Specimen Description   Final    ABSCESS RIGHT ANKLE CULTURE Performed at [facility], [address], [place], [place] [address]    Special Requests   Final    NONE Performed at [facility], [address] [place], [place] [address]    Gram Stain   Final    ABUNDANT WBC PRESENT, PREDOMINANTLY PMN NO ORGANISMS SEEN    Culture   Final    NO GROWTH 3 DAYS NO ANAEROBES ISOLATED; CULTURE IN PROGRESS FOR 5 DAYS Performed at [facility], [address], [place], [place] [address]    Report Status  PENDING  Incomplete  MRSA PCR Screening     Status: None   Collection Time: 12/03/20  3:18 PM   Specimen: Nasopharyngeal  Result Value Ref Range Status   MRSA by PCR NEGATIVE NEGATIVE Final    Comment:        The GeneXpert MRSA Assay (FDA approved for NASAL specimens only), is one component of a comprehensive MRSA colonization surveillance program. It is not intended to diagnose MRSA infection nor to guide or monitor treatment for MRSA infections. Performed at [facility], [address], [place], [place] [address]          Radiology Studies: No results found.      Scheduled Meds: . amitriptyline  25 mg Oral QHS  . aspirin EC  81 mg Oral Daily  . atorvastatin  40 mg Oral Daily  . calcium carbonate  1 tablet Oral BID WC  . docusate sodium  100 mg Oral BID  . enoxaparin (LOVENOX) injection  0.5 mg/kg Subcutaneous Q24H  . famotidine  40 mg Oral Daily  . FLUoxetine  40 mg Oral Daily  . insulin aspart  0-20 Units Subcutaneous TID WC  . insulin aspart  0-5 Units Subcutaneous QHS  . insulin glargine  30 Units Subcutaneous QHS  . insulin glargine  50 Units Subcutaneous Daily  . ipratropium  2 puff Inhalation Q4H  . loratadine  10 mg Oral Daily  . montelukast  10 mg Oral QHS  . pantoprazole  40 mg Oral Daily  . pregabalin  150 mg Oral Daily   Continuous Infusions: . sodium chloride 50 mL/hr at 12/05/20 1723  .  cefTRIAXone (ROCEPHIN)  IV Stopped (12/06/20 0325)     LOS: 7 days    Time spent: 30 mins     Charise Killian, MD [facility] Pager [phone]  If 7PM-7AM, please contact night-coverage 12/06/2020, 8:54 AM

## 2020-12-06 NOTE — TOC Progression Note (Signed)
Transition of Care [facility]) - Progression Note    Patient Details  Name: Emily Collins MRN: [id number] Date of Birth: [birth year]  [facility] CM/SW Contact  Margarito Liner, LCSW Phone Number: 12/06/2020, 2:25 PM  Clinical Narrative: Per previous TOC note, patient set up with [facility] for home health nursing.    Expected Discharge Plan: Home w Home Health Services Barriers to Discharge: Continued Medical Work up  Expected Discharge Plan and Services Expected Discharge Plan: Home w Home Health Services       Living arrangements for the past 2 months: Single Family Home                           HH Arranged: RN           Social Determinants of Health (SDOH) Interventions    Readmission Risk Interventions No flowsheet data found.

## 2020-12-07 ENCOUNTER — Inpatient Hospital Stay: Payer: Medicaid Other | Admitting: Anesthesiology

## 2020-12-07 ENCOUNTER — Encounter
Admission: EM | Disposition: A | Discharge status: Home / Self Care | Payer: Self-pay | Source: Home / Self Care | Attending: Internal Medicine

## 2020-12-07 ENCOUNTER — Telehealth: Payer: Self-pay | Admitting: *Deleted

## 2020-12-07 DIAGNOSIS — E1165 Type 2 diabetes mellitus with hyperglycemia: Secondary | ICD-10-CM | POA: Diagnosis not present

## 2020-12-07 DIAGNOSIS — N179 Acute kidney failure, unspecified: Secondary | ICD-10-CM | POA: Diagnosis not present

## 2020-12-07 DIAGNOSIS — L02619 Cutaneous abscess of unspecified foot: Secondary | ICD-10-CM | POA: Diagnosis not present

## 2020-12-07 DIAGNOSIS — L03119 Cellulitis of unspecified part of limb: Secondary | ICD-10-CM

## 2020-12-07 HISTORY — PX: INCISION AND DRAINAGE: SHX5863

## 2020-12-07 LAB — CBC
HCT: 29.3 % — ABNORMAL LOW (ref 36.0–46.0)
Hemoglobin: 9 g/dL — ABNORMAL LOW (ref 12.0–15.0)
MCH: 27.4 pg (ref 26.0–34.0)
MCHC: 30.7 g/dL (ref 30.0–36.0)
MCV: 89.3 fL (ref 80.0–100.0)
Platelets: 578 10*3/uL — ABNORMAL HIGH (ref 150–400)
RBC: 3.28 MIL/uL — ABNORMAL LOW (ref 3.87–5.11)
RDW: 15.8 % — ABNORMAL HIGH (ref 11.5–15.5)
WBC: 11.6 10*3/uL — ABNORMAL HIGH (ref 4.0–10.5)
nRBC: 0.2 % (ref 0.0–0.2)

## 2020-12-07 LAB — BASIC METABOLIC PANEL
Anion gap: 5 (ref 5–15)
BUN: 20 mg/dL (ref 6–20)
CO2: 23 mmol/L (ref 22–32)
Calcium: 8.3 mg/dL — ABNORMAL LOW (ref 8.9–10.3)
Chloride: 109 mmol/L (ref 98–111)
Creatinine, Ser: 1.1 mg/dL — ABNORMAL HIGH (ref 0.44–1.00)
GFR, Estimated: 59 mL/min — ABNORMAL LOW (ref >60)
Glucose, Bld: 86 mg/dL (ref 70–99)
Potassium: 4.8 mmol/L (ref 3.5–5.1)
Sodium: 137 mmol/L (ref 135–145)

## 2020-12-07 LAB — GLUCOSE, CAPILLARY
Glucose-Capillary: 42 mg/dL — CL (ref 70–99)
Glucose-Capillary: 54 mg/dL — ABNORMAL LOW (ref 70–99)
Glucose-Capillary: 65 mg/dL — ABNORMAL LOW (ref 70–99)
Glucose-Capillary: 95 mg/dL (ref 70–99)
Glucose-Capillary: 131 mg/dL — ABNORMAL HIGH (ref 70–99)
Glucose-Capillary: 138 mg/dL — ABNORMAL HIGH (ref 70–99)
Glucose-Capillary: 78 mg/dL (ref 70–99)
Glucose-Capillary: 69 mg/dL — ABNORMAL LOW (ref 70–99)
Glucose-Capillary: 121 mg/dL — ABNORMAL HIGH (ref 70–99)
Glucose-Capillary: 85 mg/dL (ref 70–99)
Glucose-Capillary: 72 mg/dL (ref 70–99)
Glucose-Capillary: 164 mg/dL — ABNORMAL HIGH (ref 70–99)

## 2020-12-07 LAB — AEROBIC/ANAEROBIC CULTURE W GRAM STAIN (SURGICAL/DEEP WOUND): Culture: NO GROWTH

## 2020-12-07 SURGERY — INCISION AND DRAINAGE
Anesthesia: General | Laterality: Right

## 2020-12-07 MED ORDER — ONDANSETRON HCL 4 MG/2ML IJ SOLN
INTRAMUSCULAR | Status: DC | PRN
  Administered 2020-12-07: 4 mg via INTRAVENOUS

## 2020-12-07 MED ORDER — FENTANYL CITRATE (PF) 100 MCG/2ML IJ SOLN: 25.0000 ug | INTRAMUSCULAR | Status: DC | PRN

## 2020-12-07 MED ORDER — FENTANYL CITRATE (PF) 100 MCG/2ML IJ SOLN
INTRAMUSCULAR | Status: DC | PRN
  Administered 2020-12-07 (×4): 25 ug via INTRAVENOUS

## 2020-12-07 MED ORDER — LACTATED RINGERS IV SOLN: INTRAVENOUS | Status: DC

## 2020-12-07 MED ORDER — DEXAMETHASONE SODIUM PHOSPHATE 10 MG/ML IJ SOLN
INTRAMUSCULAR | Status: DC | PRN
  Administered 2020-12-07: 5 mg via INTRAVENOUS

## 2020-12-07 MED ORDER — DEXTROSE 50 % IV SOLN
12.5000 g | Freq: Once | INTRAVENOUS | Status: AC
  Administered 2020-12-07: 12.5 g via INTRAVENOUS
  Filled 2020-12-07: qty 50

## 2020-12-07 MED ORDER — ACETAMINOPHEN 325 MG PO TABS: 650.0000 mg | ORAL_TABLET | ORAL | Status: DC | PRN

## 2020-12-07 MED ORDER — ONDANSETRON HCL 4 MG/2ML IJ SOLN: 4.0000 mg | Freq: Once | INTRAMUSCULAR | Status: DC | PRN

## 2020-12-07 MED ORDER — DEXMEDETOMIDINE (PRECEDEX) IN NS 20 MCG/5ML (4 MCG/ML) IV SYRINGE
PREFILLED_SYRINGE | INTRAVENOUS | Status: DC | PRN
  Administered 2020-12-07 (×2): 4 ug via INTRAVENOUS

## 2020-12-07 MED ORDER — LACTATED RINGERS IV SOLN: INTRAVENOUS | Status: DC | PRN

## 2020-12-07 MED ORDER — ACETAMINOPHEN 10 MG/ML IV SOLN: 1000.0000 mg | Freq: Once | INTRAVENOUS | Status: DC | PRN

## 2020-12-07 MED ORDER — SODIUM CHLORIDE 0.9% FLUSH: 3.0000 mL | INTRAVENOUS | Status: DC | PRN

## 2020-12-07 MED ORDER — BUPIVACAINE HCL 0.5 % IJ SOLN
INTRAMUSCULAR | Status: DC | PRN
  Administered 2020-12-07: 10 mL

## 2020-12-07 MED ORDER — MIDAZOLAM HCL 2 MG/2ML IJ SOLN
INTRAMUSCULAR | Status: DC | PRN
  Administered 2020-12-07: 2 mg via INTRAVENOUS

## 2020-12-07 MED ORDER — MIDAZOLAM HCL 2 MG/2ML IJ SOLN
INTRAMUSCULAR | Status: AC
  Filled 2020-12-07: qty 2

## 2020-12-07 MED ORDER — FENTANYL CITRATE (PF) 100 MCG/2ML IJ SOLN
INTRAMUSCULAR | Status: AC
  Filled 2020-12-07: qty 2

## 2020-12-07 MED ORDER — LIDOCAINE HCL 1 % IJ SOLN
INTRAMUSCULAR | Status: DC | PRN
  Administered 2020-12-07: 10 mL

## 2020-12-07 MED ORDER — LIDOCAINE HCL (CARDIAC) PF 100 MG/5ML IV SOSY
PREFILLED_SYRINGE | INTRAVENOUS | Status: DC | PRN
  Administered 2020-12-07: 60 mg via INTRAVENOUS

## 2020-12-07 MED ORDER — PROPOFOL 10 MG/ML IV BOLUS
INTRAVENOUS | Status: DC | PRN
  Administered 2020-12-07: 130 mg via INTRAVENOUS

## 2020-12-07 SURGICAL SUPPLY — 56 items
BNDG COHESIVE 4X5 TAN STRL (GAUZE/BANDAGES/DRESSINGS) ×2 IMPLANT
BNDG COHESIVE 6X5 TAN STRL LF (GAUZE/BANDAGES/DRESSINGS) ×2 IMPLANT
BNDG CONFORM 2 STRL LF (GAUZE/BANDAGES/DRESSINGS) ×1 IMPLANT
BNDG CONFORM 3 STRL LF (GAUZE/BANDAGES/DRESSINGS) ×1 IMPLANT
BNDG ELASTIC 4X5.8 VLCR STR LF (GAUZE/BANDAGES/DRESSINGS) ×2 IMPLANT
BNDG ESMARK 4X12 TAN STRL LF (GAUZE/BANDAGES/DRESSINGS) ×2 IMPLANT
BNDG GAUZE 4.5X4.1 6PLY STRL (MISCELLANEOUS) ×2 IMPLANT
COVER WAND RF STERILE (DRAPES) ×2 IMPLANT
CUFF TOURN SGL QUICK 12 (TOURNIQUET CUFF) IMPLANT
CUFF TOURN SGL QUICK 18X4 (TOURNIQUET CUFF) IMPLANT
CUFF TOURN SGL QUICK 24 (TOURNIQUET CUFF) ×2
CUFF TRNQT CYL 24X4X16.5-23 (TOURNIQUET CUFF) IMPLANT
DRSG ADAPTIC 3X8 NADH LF (GAUZE/BANDAGES/DRESSINGS) ×2 IMPLANT
DURAPREP 26ML APPLICATOR (WOUND CARE) ×1 IMPLANT
ELECT REM PT RETURN 9FT ADLT (ELECTROSURGICAL) ×2 IMPLANT
ELECTRODE REM PT RTRN 9FT ADLT (ELECTROSURGICAL) ×1 IMPLANT
GAUZE PACKING 1/4 X5 YD (GAUZE/BANDAGES/DRESSINGS) IMPLANT
GAUZE PACKING IODOFORM 1X5 (PACKING) IMPLANT
GAUZE SPONGE 4X4 12PLY STRL (GAUZE/BANDAGES/DRESSINGS) ×2 IMPLANT
GLOVE SURG ENC MOIS LTX SZ8 (GLOVE) ×2 IMPLANT
GLOVE SURG ORTHO LTX SZ8 (GLOVE) ×2 IMPLANT
GOWN STRL REUS W/ TWL XL LVL3 (GOWN DISPOSABLE) ×1 IMPLANT
GOWN STRL REUS W/TWL MED LVL3 (GOWN DISPOSABLE) ×2 IMPLANT
GOWN STRL REUS W/TWL XL LVL3 (GOWN DISPOSABLE) ×2
HANDPIECE VERSAJET DEBRIDEMENT (MISCELLANEOUS) IMPLANT
IV NS 1000ML (IV SOLUTION) ×2
IV NS 1000ML BAXH (IV SOLUTION) ×1 IMPLANT
KIT TURNOVER KIT A (KITS) ×2 IMPLANT
LABEL OR SOLS (LABEL) ×2 IMPLANT
MANIFOLD NEPTUNE II (INSTRUMENTS) ×2 IMPLANT
NDL FILTER BLUNT 18X1 1/2 (NEEDLE) ×1 IMPLANT
NDL HYPO 25X1 1.5 SAFETY (NEEDLE) ×1 IMPLANT
NEEDLE FILTER BLUNT 18X 1/2SAF (NEEDLE) ×1
NEEDLE FILTER BLUNT 18X1 1/2 (NEEDLE) ×1 IMPLANT
NEEDLE HYPO 25X1 1.5 SAFETY (NEEDLE) ×2 IMPLANT
NS IRRIG 500ML POUR BTL (IV SOLUTION) ×2 IMPLANT
PACK EXTREMITY ARMC (MISCELLANEOUS) ×2 IMPLANT
PAD ABD DERMACEA PRESS 5X9 (GAUZE/BANDAGES/DRESSINGS) ×2 IMPLANT
PULSAVAC PLUS IRRIG FAN TIP (DISPOSABLE) ×2 IMPLANT
SOL .9 NS 3000ML IRR  AL (IV SOLUTION)
SOL .9 NS 3000ML IRR AL (IV SOLUTION)
SOL .9 NS 3000ML IRR UROMATIC (IV SOLUTION) IMPLANT
SOL PREP PVP 2OZ (MISCELLANEOUS) ×2 IMPLANT
SOLUTION PREP PVP 2OZ (MISCELLANEOUS) ×1 IMPLANT
STAPLER SKIN PROX 35W (STAPLE) ×1 IMPLANT
STOCKINETTE IMPERVIOUS 9X36 MD (GAUZE/BANDAGES/DRESSINGS) ×2 IMPLANT
SUT ETHILON 2 0 FS 18 (SUTURE) ×4 IMPLANT
SUT ETHILON 4-0 (SUTURE)
SUT ETHILON 4-0 FS2 18XMFL BLK (SUTURE) IMPLANT
SUT VIC AB 3-0 SH 27 (SUTURE) ×2
SUT VIC AB 3-0 SH 27X BRD (SUTURE) ×1 IMPLANT
SUT VIC AB 4-0 FS2 27 (SUTURE) ×1 IMPLANT
SUTURE ETHLN 4-0 FS2 18XMF BLK (SUTURE) ×1 IMPLANT
SWAB CULTURE AMIES ANAERIB BLU (MISCELLANEOUS) IMPLANT
SYR 10ML LL (SYRINGE) ×4 IMPLANT
TIP FAN IRRIG PULSAVAC PLUS (DISPOSABLE) IMPLANT

## 2020-12-07 NOTE — Anesthesia Procedure Notes (Signed)
Procedure Name: LMA Insertion Date/Time: 12/07/2020 6:03 PM Performed by: Jaye Beagle, CRNA Pre-anesthesia Checklist: Patient identified, Emergency Drugs available, Suction available and Patient being monitored Patient Re-evaluated:Patient Re-evaluated prior to induction Oxygen Delivery Method: Circle system utilized Preoxygenation: Pre-oxygenation with 100% oxygen Induction Type: IV induction LMA: LMA inserted LMA Size: 3.5 Tube secured with: Tape Dental Injury: Teeth and Oropharynx as per pre-operative assessment

## 2020-12-07 NOTE — Transfer of Care (Signed)
Immediate Anesthesia Transfer of Care Note  Patient: Emily Collins  Procedure(s) Performed: INCISION AND DRAINAGE (Right )  Patient Location: PACU  Anesthesia Type:General  Level of Consciousness: drowsy  Airway & Oxygen Therapy: Patient Spontanous Breathing and Patient connected to face mask oxygen  Post-op Assessment: Report given to RN and Post -op Vital signs reviewed and stable  Post vital signs: Reviewed and stable  Last Vitals:  Vitals Value Taken Time  BP 150/82 12/07/20 1847  Temp 36.2 C 12/07/20 1847  Pulse 72 12/07/20 1848  Resp 9 12/07/20 1848  SpO2 99 % 12/07/20 1848  Vitals shown include unvalidated device data.  Last Pain:  Vitals:   12/07/20 1847  TempSrc:   PainSc: Asleep      Patients Stated Pain Goal: 0 (12/06/20 2046)  Complications: No complications documented.

## 2020-12-07 NOTE — Progress Notes (Signed)
Inpatient Diabetes Program Recommendations  AACE/ADA: New Consensus Statement on Inpatient Glycemic Control (2015)  Target Ranges:  Prepandial:   less than 140 mg/dL      Peak postprandial:   less than 180 mg/dL (1-2 hours)      Critically ill patients:  140 - 180 mg/dL   Results for CAMAY, PEDIGO ANN (MRN [id number]) as of 12/07/2020 06:59  Ref. Range 12/05/2020 08:09 12/05/2020 12:04 12/05/2020 16:49 12/05/2020 19:44 12/05/2020 23:09  Glucose-Capillary Latest Ref Range: 70 - 99 mg/dL 458 (H)  3 units NOVOLOG  50 units LANTUS  137 (H)  3 units NOVOLOG  66 (L) 86 67 (L)    LANTUS HELD   Results for ELIS, RAWLINSON ANN (MRN [id number]) as of 12/07/2020 06:59  Ref. Range 12/06/2020 07:30 12/06/2020 11:46 12/06/2020 16:24 12/06/2020 20:49  Glucose-Capillary Latest Ref Range: 70 - 99 mg/dL 97  50 units LANTUS 053 (H) 106 (H) 152 (H)  30 units LANTUS   Results for GARA, KINCADE ANN (MRN [id number]) as of 12/07/2020 06:59  Ref. Range 12/07/2020 02:32 12/07/2020 02:55 12/07/2020 03:27 12/07/2020 04:18 12/07/2020 06:15  Glucose-Capillary Latest Ref Range: 70 - 99 mg/dL 54 (L) 42 (LL) 65 (L) 95 131 (H)    Home DM Meds:Humalog Inpen InPen settings include:Target blood glucose 100, Sensitivity 30, I:C ratio 10 Levemir 60 units AM/ 40 units PM Trulicity 3mg  Qweek  Current Orders:Lantus50units AM/ 30 units PM Novolog 0-20 units TID AC + HS    Note multiple Hypoglycemic events over the last several days.    Severe Hypoglycemia early this AM--Pt received total of 80 units Lantus yesterday   MD- Please consider the following:  1. Stop Lantus 30 units QHS   2. Continue Lantus 50 units Daily in the AM (this would be 50% of her total home dose for now)--Given all the issues with Hypoglycemia she has been having it appears she needs less basal insulin in the hospital  3. Reduce  Novolog SSi to the 0-15 unit scale (Moderate) TID AC + HS     --Will follow patient during hospitalization--  RN, MSN, CDE Diabetes Coordinator Inpatient Glycemic Control Team Team Pager: [phone] (8a-5p)

## 2020-12-07 NOTE — Op Note (Signed)
OPERATIVE REPORT Patient name: Emily Collins MRN: [id number] DOB: [birth year]  DOS: 12/07/2020  Preop Dx: Abscess/cellulitis right ankle Postop Dx: same  Procedure:  1.  Incision and drainage with irrigation and debridement right ankle  Surgeon: [name] DPM  Anesthesia: 50-50 mixture of 2% lidocaine plain with 0.5% Marcaine plain totaling 20 mm infiltrated in the patient's right lower extremity  Hemostasis: Calf tourniquet inflated to a pressure of without Esmarch exsanguination  EBL: 20 mL Materials: None Injectables: None Pathology: None  Condition: The patient tolerated the procedure and anesthesia well. No complications noted or reported   Justification for procedure: The patient is a 55 y.o. female who presents today for surgical correction of recurrent abscess/cellulitis right foot and ankle.  Patient underwent original incision and drainage on 12/01/2020.  There was concern for recollection of fluid and abscess and decision was made to return to the OR for additional washout with irrigation and debridement. The patient was told benefits as well as possible side effects of the surgery. The patient consented for surgical correction. The patient consent form was reviewed. All patient questions were answered. No guarantees were expressed or implied. The patient and the surgeon boson the patient consent form with the witness present and placed in the patient's chart.   Procedure in Detail: The patient was brought to the operating room, placed in the operating table in the supine position at which time an aseptic scrub and drape were performed about the patient's respective lower extremity after anesthesia was induced as described above. Attention was then directed to the surgical area where procedure number one commenced.  Procedure #1: Incision and drainage/irrigation debridement right ankle Skin sutures from previous surgery on 12/01/2020 were removed.  The  incisions both medial and lateral to the anterior aspect of the right ankle were opened with blunt dissection.  Sutures from the posterior heel ulceration were also removed.  Blunt dissection was utilized to explore all soft tissue areas both medial and lateral, anterior and posterior to the ankle joint. There was no identifiable collection of abscess or purulence within the ankle soft tissues or posterior heel.  There was a heavy amount of serous fluid expressed and drained from the incision sites, however there was minimal purulence.  Pulse lavage in combination with 3 L of normal saline was utilized for copious irrigation of all incision sites and soft tissue around the area.  Decision was made to leave the incisions open and packed all incisions with saline wet-to-dry dressings.  Dry sterile compressive dressings were then applied to all previously mentioned incision sites about the patient's lower extremity. The tourniquet which was used for hemostasis was deflated. All normal neurovascular responses including pink color and warmth returned all the digits of patient's lower extremity.  The patient was then transferred from the operating room to the recovery room having tolerated the procedure and anesthesia well. All vital signs are stable. After a brief stay in the recovery room the patient was readmitted to inpatient room with adequate prescriptions for analgesia. Verbal as well as written instructions were provided for the patient regarding wound care. The patient is to keep the dressings clean dry and intact until they are to follow surgeon Dr. Gala Lewandowsky in the office upon discharge.   [name], DPM [facility]  Dr. [name], DPM    [address]  [place], [place] [address]                Office [phone]  Fax [phone]

## 2020-12-07 NOTE — Anesthesia Preprocedure Evaluation (Signed)
Anesthesia Evaluation  Patient identified by MRN, date of birth, ID band Patient awake    Reviewed: Allergy & Precautions, NPO status , Patient's Chart, lab work & pertinent test results  History of Anesthesia Complications Negative for: history of anesthetic complications  Airway Mallampati: II  TM Distance: >3 FB Neck ROM: Full    Dental  (+) Teeth Intact   Pulmonary asthma , neg sleep apnea, neg COPD, Not current smoker, former smoker,           Cardiovascular hypertension, Pt. on medications (-) Past MI and (-) CHF (-) dysrhythmias (-) Valvular Problems/Murmurs Rhythm:Regular Rate:Normal     Neuro/Psych neg Seizures PSYCHIATRIC DISORDERS Anxiety Depression    GI/Hepatic GERD  Medicated and Controlled,  Endo/Other  diabetes, Type 2, Oral Hypoglycemic Agents  Renal/GU Renal InsufficiencyRenal disease     Musculoskeletal   Abdominal (+) + obese,   Peds  Hematology   Anesthesia Other Findings   Reproductive/Obstetrics                             Anesthesia Physical  Anesthesia Plan  ASA: III  Anesthesia Plan: General   Post-op Pain Management:    Induction: Intravenous  PONV Risk Score and Plan: 3 and Ondansetron, Dexamethasone and Midazolam  Airway Management Planned: LMA  Additional Equipment: None  Intra-op Plan:   Post-operative Plan: Extubation in OR  Informed Consent: I have reviewed the patients History and Physical, chart, labs and discussed the procedure including the risks, benefits and alternatives for the proposed anesthesia with the patient or authorized representative who has indicated his/her understanding and acceptance.     Dental advisory given  Plan Discussed with: CRNA and Surgeon  Anesthesia Plan Comments: (Discussed risks of anesthesia with patient, including PONV, sore throat, lip/dental damage. Rare risks discussed as well, such as  cardiorespiratory and neurological sequelae. Patient understands.)        Anesthesia Quick Evaluation

## 2020-12-07 NOTE — Telephone Encounter (Signed)
-----   Message from Candelaria Stagers, DPM sent at 12/02/2020  7:59 AM EST ----- Regarding: Make an appointment for this patient Hi Naithen Rivenburg,  Can you make an appointment for this patient with Dr. Teresita Madura sometime next week.  Patient still in the hospital and will likely be discharged this weekend.  Thank you

## 2020-12-07 NOTE — Progress Notes (Signed)
PROGRESS NOTE   HPI was taken from Dr. Maisie Fushomas: [name] is a 55 y.o. female with medical history significant of GERD,OA,Asthma,HLD, HTN, Depression/Anxiety,Type I DM complicated by PVD,retinopathy and peripheral neuropathy, CKDIII,who presents to ED with interim history of infected right heal ulcer x 2 weeks. Patient has been treated with antibiotics but despite this noted progression of symptoms. She now presents due to increasing pain, redness and swelling of her right leg. She denies ,fever/chills/ n/v/d/or abdominal pain. She notes no sob , cough or uri symptoms.  Hospital Course from Dr. Mayford KnifeWilliams 3/9-3/15/22: Pt was found to have right leg/foot cellulitis w/ right diabetic foot infection. Pt is s/p I&D of right leg & foot on 12/01/20. Wound cxs still show NGTD. Pt was initially on broad spectrum abxs w/ flagyl, vanco, & ceftriaxone. The abxs were narrowed to IV ceftriaxone as nothing has grown on the cultures. Pt will go back to the OR today for repeat debridement, I&D and washout as per podiatry and likely be d/c tomorrow.    [name]  ZOX:[id number] DOB: [birth year] DOA: 11/29/2020 PCP: Oswaldo ConroyBender, Abby Daneele, MD   Assessment & Plan:   Active Problems:   Cellulitis   Right leg pain   Ulcer of right heel and midfoot with fat layer exposed (HCC)   Abscess of right leg   Uncontrolled type 2 diabetes mellitus with hyperglycemia (HCC)   Cellulitis and abscess of foot, except toes  Right leg/foot cellulitis with right diabetic foot wound infection: s/p I&D of right leg & foot 12/01/20. Wound cx still NGTD. Continue on IV ceftriaxone. WBC trended up slightly today. Will go back to the OR for another I&D/washout as per podiatry. HH has been set up for dressing changes but dressing changing orders may need to updated after repeat surgery today.   AKI on CKDIIIa: Cr is trending down daily. Avoid nephrotoxic meds   Hyperkalemia: resolved  DM2: HbA1c 10.2, poorly controlled.  Continue on lantus, SSI w/ accuchecks  Depression: severity unknown. Continue on home dose of fluoxetine   Morbid obesity: BMI 41.8. Complicates overall care and prognosis   Thrombocytosis: etiology unclear, likely reactive.   Leukocytosis: continue on IV abxs.   Likely ACD: H&H are labile. S/p 1 unit of pRBCs transfused   Hyponatremia: resolved   DVT prophylaxis: lovenox  Code Status: full  Family Communication:  Disposition Plan: likely home w/ home health. going back to the OR today and can likely d/c tomorrow    Level of care: Med-Surg   Status is: Inpatient  Remains inpatient appropriate because:Ongoing diagnostic testing needed not appropriate for outpatient work up, Unsafe d/c plan, IV treatments appropriate due to intensity of illness or inability to take PO and Inpatient level of care appropriate due to severity of illness   Dispo: The patient is from: Home              Anticipated d/c is to: Home w/ home health               Patient currently is not medically stable to d/c.   Difficult to place patient Yes       Consultants:   Podiatry    Procedures:    Antimicrobials: ceftriaxone   Subjective: Pt c/o fatigue    Objective: Vitals:   12/06/20 1625 12/06/20 2046 12/06/20 2340 12/07/20 0613  BP: 122/63 136/60 (!) 132/59 (!) 141/57  Pulse: 74 88 76 78  Resp: 16 18 18 18   Temp: 97.7 F (36.5 C)  97.8 F (36.6 C) 98.1 F (36.7 C) 97.9 F (36.6 C)  TempSrc:  Oral Oral Oral  SpO2: 96% 98% 98% 96%  Weight:      Height:        Intake/Output Summary (Last 24 hours) at 12/07/2020 0734 Last data filed at 12/06/2020 2046 Gross per 24 hour  Intake 480 ml  Output -  Net 480 ml   Filed Weights   11/29/20 1301 11/30/20 0353  Weight: 104 kg 103.8 kg    Examination:  General exam: Appears calm and comfortable  Respiratory system: clear breath sounds b/l  Cardiovascular system: S1 &S2+. No rubs or clicks  Gastrointestinal system: Abd is soft, NT,  obese, normal bowel sounds  Central nervous system: Alert and oriented. Moves all 4 extremities  Skin: right foot is dressed and it is C/D/I  Psychiatry: Judgement and insight appear normal. Flat mood and affect    Data Reviewed: I have personally reviewed following labs and imaging studies  CBC: Recent Labs  Lab 12/01/20 0509 12/01/20 1821 12/03/20 0412 12/04/20 0410 12/05/20 0420 12/06/20 0425 12/07/20 0421  WBC 15.9*   < > 17.0* 14.2* 12.0* 12.5* 11.6*  NEUTROABS 11.2*  --   --   --   --   --   --   HGB 7.6*   < > 8.7* 8.7* 8.7* 8.4* 9.0*  HCT 24.9*   < > 28.1* 28.2* 28.3* 27.5* 29.3*  MCV 87.4   < > 87.5 89.0 89.6 89.9 89.3  PLT 558*   < > 622* 634* 591* 573* 578*   < > = values in this interval not displayed.   Basic Metabolic Panel: Recent Labs  Lab 12/03/20 0412 12/04/20 0410 12/05/20 0420 12/06/20 0425 12/07/20 0421  NA 137 138 136 137 137  K 4.8 4.7 4.8 4.6 4.8  CL 106 108 108 109 109  CO2 22 22 22 22 23   GLUCOSE 117* 43* 162* 75 86  BUN 40* 35* 30* 24* 20  CREATININE 1.93* 1.63* 1.33* 1.17* 1.10*  CALCIUM 8.5* 8.3* 8.1* 7.9* 8.3*   GFR: Estimated Creatinine Clearance: 65.3 mL/min (A) (by C-G formula based on SCr of 1.1 mg/dL (H)). Liver Function Tests: No results for input(s): AST, ALT, ALKPHOS, BILITOT, PROT, ALBUMIN in the last 168 hours. No results for input(s): LIPASE, AMYLASE in the last 168 hours. No results for input(s): AMMONIA in the last 168 hours. Coagulation Profile: No results for input(s): INR, PROTIME in the last 168 hours. Cardiac Enzymes: No results for input(s): CKTOTAL, CKMB, CKMBINDEX, TROPONINI in the last 168 hours. BNP (last 3 results) No results for input(s): PROBNP in the last 8760 hours. HbA1C: No results for input(s): HGBA1C in the last 72 hours. CBG: Recent Labs  Lab 12/07/20 0232 12/07/20 0255 12/07/20 0327 12/07/20 0418 12/07/20 0615  GLUCAP 54* 42* 65* 95 131*   Lipid Profile: No results for input(s): CHOL,  HDL, LDLCALC, TRIG, CHOLHDL, LDLDIRECT in the last 72 hours. Thyroid Function Tests: No results for input(s): TSH, T4TOTAL, FREET4, T3FREE, THYROIDAB in the last 72 hours. Anemia Panel: No results for input(s): VITAMINB12, FOLATE, FERRITIN, TIBC, IRON, RETICCTPCT in the last 72 hours. Sepsis Labs: No results for input(s): PROCALCITON, LATICACIDVEN in the last 168 hours.  Recent Results (from the past 240 hour(s))  SARS CORONAVIRUS 2 (TAT 6-24 HRS) Nasopharyngeal Nasopharyngeal Swab     Status: None   Collection Time: 11/29/20  4:20 PM   Specimen: Nasopharyngeal Swab  Result Value Ref Range Status  SARS Coronavirus 2 NEGATIVE NEGATIVE Final    Comment: (NOTE) SARS-CoV-2 target nucleic acids are NOT DETECTED.  The SARS-CoV-2 RNA is generally detectable in upper and lower respiratory specimens during the acute phase of infection. Negative results do not preclude SARS-CoV-2 infection, do not rule out co-infections with other pathogens, and should not be used as the sole basis for treatment or other patient management decisions. Negative results must be combined with clinical observations, patient history, and epidemiological information. The expected result is Negative.  Fact Sheet for Patients: HairSlick.no  Fact Sheet for Healthcare Providers: quierodirigir.com  This test is not yet approved or cleared by the [place] FDA and  has been authorized for detection and/or diagnosis of SARS-CoV-2 by FDA under an Emergency Use Authorization (EUA). This EUA will remain  in effect (meaning this test can be used) for the duration of the COVID-19 declaration under Se ction 564(b)(1) of the Act, 21 U.S.C. section 360bbb-3(b)(1), unless the authorization is terminated or revoked sooner.  Performed at [facility], [address], [place], [place] [address]   Aerobic/Anaerobic Culture w Gram Stain (surgical/deep wound)      Status: None (Preliminary result)   Collection Time: 12/01/20  6:56 PM   Specimen: Wound  Result Value Ref Range Status   Specimen Description   Final    ABSCESS RIGHT ANKLE CULTURE Performed at [facility], [address] [place], [place] [address]    Special Requests   Final    NONE Performed at [facility], [address] [place], [place] [address]    Gram Stain   Final    ABUNDANT WBC PRESENT, PREDOMINANTLY PMN NO ORGANISMS SEEN    Culture   Final    NO GROWTH 3 DAYS NO ANAEROBES ISOLATED; CULTURE IN PROGRESS FOR 5 DAYS Performed at [facility], [address] [place], [place] [address]    Report Status PENDING  Incomplete  MRSA PCR Screening     Status: None   Collection Time: 12/03/20  3:18 PM   Specimen: Nasopharyngeal  Result Value Ref Range Status   MRSA by PCR NEGATIVE NEGATIVE Final    Comment:        The GeneXpert MRSA Assay (FDA approved for NASAL specimens only), is one component of a comprehensive MRSA colonization surveillance program. It is not intended to diagnose MRSA infection nor to guide or monitor treatment for MRSA infections. Performed at [facility], [address] [place], [place] [address]          Radiology Studies: No results found.      Scheduled Meds: . amitriptyline  25 mg Oral QHS  . aspirin EC  81 mg Oral Daily  . atorvastatin  40 mg Oral Daily  . calcium carbonate  1 tablet Oral BID WC  . Chlorhexidine Gluconate Cloth  6 each Topical Once  . docusate sodium  100 mg Oral BID  . enoxaparin (LOVENOX) injection  0.5 mg/kg Subcutaneous Q24H  . famotidine  40 mg Oral Daily  . FLUoxetine  40 mg Oral Daily  . insulin aspart  0-20 Units Subcutaneous TID WC  . insulin aspart  0-5 Units Subcutaneous QHS  . insulin glargine  30 Units Subcutaneous QHS  . insulin glargine  50 Units Subcutaneous Daily  . ipratropium  2 puff Inhalation Q4H  . loratadine  10 mg Oral Daily  . montelukast   10 mg Oral QHS  . pantoprazole  40 mg Oral Daily  .  pregabalin  150 mg Oral Daily   Continuous Infusions: . sodium chloride 50 mL/hr at 12/07/20 0024  . cefTRIAXone (ROCEPHIN)  IV 2 g (12/06/20 1509)     LOS: 8 days    Time spent: 31 mins     Charise Killian, MD [facility] Pager [phone]  If 7PM-7AM, please contact night-coverage 12/07/2020, 7:34 AM

## 2020-12-07 NOTE — Brief Op Note (Signed)
12/07/2020  7:18 PM  PATIENT:  Emily Collins  55 y.o. female  PRE-OPERATIVE DIAGNOSIS:  Right ankle abcess, cellulitis  POST-OPERATIVE DIAGNOSIS:  Right ankle abcess, cellulitis  PROCEDURE:  Procedure(s): INCISION AND DRAINAGE (Right)  SURGEON:  Surgeon(s) and Role:    [name], DPM - Primary  PHYSICIAN ASSISTANT:   ASSISTANTS: none   ANESTHESIA:   local and general  EBL:  5 mL   BLOOD ADMINISTERED:none  DRAINS: none   LOCAL MEDICATIONS USED:  MARCAINE    and LIDOCAINE   SPECIMEN:  No Specimen  DISPOSITION OF SPECIMEN:  N/A  COUNTS:  YES  TOURNIQUET:   Total Tourniquet Time Documented: Calf (Right) - 19 minutes Total: Calf (Right) - 19 minutes   DICTATION: .Reubin Milan Dictation  PLAN OF CARE: Admit to inpatient   PATIENT DISPOSITION:  PACU - hemodynamically stable.   Delay start of Pharmacological VTE agent (>24hrs) due to surgical blood loss or risk of bleeding: no

## 2020-12-07 NOTE — Interval H&P Note (Signed)
History and Physical Interval Note:  12/07/2020 2:49 PM  Emily Collins  has presented today for surgery, with the diagnosis of right ankle abcess, cellulitis.  The various methods of treatment have been discussed with the patient and family. After consideration of risks, benefits and other options for treatment, the patient has consented to  Procedure(s): INCISION AND DRAINAGE (Right) as a surgical intervention.  The patient's history has been reviewed, patient examined, no change in status, stable for surgery.  I have reviewed the patient's chart and labs.  Questions were answered to the patient's satisfaction.     [name]

## 2020-12-07 NOTE — Telephone Encounter (Signed)
I left a message for Ms Emily Collins to call me back.   I was going to schedule her an appointment with Dr. Lilian Kapur on tomorrow but I see she's having surgery today with Dr. Logan Bores.  The appointment is not needed now.

## 2020-12-08 ENCOUNTER — Encounter: Payer: Self-pay | Admitting: Podiatry

## 2020-12-08 DIAGNOSIS — D72829 Elevated white blood cell count, unspecified: Secondary | ICD-10-CM

## 2020-12-08 DIAGNOSIS — L02415 Cutaneous abscess of right lower limb: Secondary | ICD-10-CM | POA: Diagnosis not present

## 2020-12-08 DIAGNOSIS — N189 Chronic kidney disease, unspecified: Secondary | ICD-10-CM

## 2020-12-08 DIAGNOSIS — N179 Acute kidney failure, unspecified: Secondary | ICD-10-CM

## 2020-12-08 DIAGNOSIS — E875 Hyperkalemia: Secondary | ICD-10-CM | POA: Diagnosis not present

## 2020-12-08 DIAGNOSIS — F32A Depression, unspecified: Secondary | ICD-10-CM

## 2020-12-08 LAB — BASIC METABOLIC PANEL
Anion gap: 4 — ABNORMAL LOW (ref 5–15)
Anion gap: 6 (ref 5–15)
Anion gap: 6 (ref 5–15)
BUN: 23 mg/dL — ABNORMAL HIGH (ref 6–20)
BUN: 23 mg/dL — ABNORMAL HIGH (ref 6–20)
BUN: 22 mg/dL — ABNORMAL HIGH (ref 6–20)
CO2: 24 mmol/L (ref 22–32)
CO2: 23 mmol/L (ref 22–32)
CO2: 23 mmol/L (ref 22–32)
Calcium: 7.9 mg/dL — ABNORMAL LOW (ref 8.9–10.3)
Calcium: 8 mg/dL — ABNORMAL LOW (ref 8.9–10.3)
Calcium: 8.1 mg/dL — ABNORMAL LOW (ref 8.9–10.3)
Chloride: 105 mmol/L (ref 98–111)
Chloride: 105 mmol/L (ref 98–111)
Chloride: 104 mmol/L (ref 98–111)
Creatinine, Ser: 1.28 mg/dL — ABNORMAL HIGH (ref 0.44–1.00)
Creatinine, Ser: 1.24 mg/dL — ABNORMAL HIGH (ref 0.44–1.00)
Creatinine, Ser: 1.19 mg/dL — ABNORMAL HIGH (ref 0.44–1.00)
GFR, Estimated: 49 mL/min — ABNORMAL LOW (ref >60)
GFR, Estimated: 51 mL/min — ABNORMAL LOW (ref >60)
GFR, Estimated: 54 mL/min — ABNORMAL LOW (ref >60)
Glucose, Bld: 500 mg/dL — ABNORMAL HIGH (ref 70–99)
Glucose, Bld: 470 mg/dL — ABNORMAL HIGH (ref 70–99)
Glucose, Bld: 196 mg/dL — ABNORMAL HIGH (ref 70–99)
Potassium: 7 mmol/L (ref 3.5–5.1)
Potassium: 6.2 mmol/L — ABNORMAL HIGH (ref 3.5–5.1)
Potassium: 5 mmol/L (ref 3.5–5.1)
Sodium: 133 mmol/L — ABNORMAL LOW (ref 135–145)
Sodium: 134 mmol/L — ABNORMAL LOW (ref 135–145)
Sodium: 133 mmol/L — ABNORMAL LOW (ref 135–145)

## 2020-12-08 LAB — GLUCOSE, CAPILLARY
Glucose-Capillary: 454 mg/dL — ABNORMAL HIGH (ref 70–99)
Glucose-Capillary: 362 mg/dL — ABNORMAL HIGH (ref 70–99)
Glucose-Capillary: 176 mg/dL — ABNORMAL HIGH (ref 70–99)
Glucose-Capillary: 242 mg/dL — ABNORMAL HIGH (ref 70–99)
Glucose-Capillary: 223 mg/dL — ABNORMAL HIGH (ref 70–99)

## 2020-12-08 LAB — CBC
HCT: 28 % — ABNORMAL LOW (ref 36.0–46.0)
Hemoglobin: 8.5 g/dL — ABNORMAL LOW (ref 12.0–15.0)
MCH: 27.5 pg (ref 26.0–34.0)
MCHC: 30.4 g/dL (ref 30.0–36.0)
MCV: 90.6 fL (ref 80.0–100.0)
Platelets: 534 10*3/uL — ABNORMAL HIGH (ref 150–400)
RBC: 3.09 MIL/uL — ABNORMAL LOW (ref 3.87–5.11)
RDW: 15.7 % — ABNORMAL HIGH (ref 11.5–15.5)
WBC: 16.2 10*3/uL — ABNORMAL HIGH (ref 4.0–10.5)
nRBC: 0 % (ref 0.0–0.2)

## 2020-12-08 LAB — BETA-HYDROXYBUTYRIC ACID: Beta-Hydroxybutyric Acid: 0.42 mmol/L — ABNORMAL HIGH (ref 0.05–0.27)

## 2020-12-08 MED ORDER — SODIUM ZIRCONIUM CYCLOSILICATE 10 G PO PACK
10.0000 g | PACK | Freq: Once | ORAL | Status: AC
  Administered 2020-12-08: 10 g via ORAL
  Filled 2020-12-08: qty 1

## 2020-12-08 MED ORDER — SODIUM CHLORIDE 0.9 % IV SOLN
3.0000 g | Freq: Four times a day (QID) | INTRAVENOUS | Status: DC
  Administered 2020-12-08 – 2020-12-09 (×3): 3 g via INTRAVENOUS
  Filled 2020-12-08 (×8): qty 8

## 2020-12-08 MED ORDER — DEXTROSE 50 % IV SOLN: 1.0000 | Freq: Once | INTRAVENOUS | Status: DC

## 2020-12-08 MED ORDER — CALCIUM GLUCONATE-NACL 1-0.675 GM/50ML-% IV SOLN
1.0000 g | Freq: Once | INTRAVENOUS | Status: AC
  Administered 2020-12-08: 1000 mg via INTRAVENOUS
  Filled 2020-12-08: qty 50

## 2020-12-08 MED ORDER — INSULIN ASPART 100 UNIT/ML ~~LOC~~ SOLN
14.0000 [IU] | Freq: Once | SUBCUTANEOUS | Status: AC
  Administered 2020-12-08: 14 [IU] via SUBCUTANEOUS
  Filled 2020-12-08: qty 1

## 2020-12-08 MED ORDER — INSULIN ASPART 100 UNIT/ML IV SOLN
10.0000 [IU] | Freq: Once | INTRAVENOUS | Status: DC
  Filled 2020-12-08: qty 0.1

## 2020-12-08 MED ORDER — LACTATED RINGERS IV BOLUS
1000.0000 mL | Freq: Once | INTRAVENOUS | Status: AC
  Administered 2020-12-08: 1000 mL via INTRAVENOUS

## 2020-12-08 NOTE — Progress Notes (Addendum)
  Subjective:  Patient ID: [name], female    DOB: 12/30/1965,  MRN: [id number]  She feels better, her pain is improved she denies fever chills nausea vomiting she is very eager to get out of the hospital and see her family Objective:   Vitals:   12/08/20 1147 12/08/20 1540  BP: 128/89 136/62  Pulse: 90 74  Resp: 16 18  Temp: 98.6 F (37 C) 98.2 F (36.8 C)  SpO2: 98% 98%    Cellulitis largely resolved, there are dual medial lateral anterior ankle incisions, the medial measures 4.0 x 2.0 x 1.5 cm, the lateral measures 5.0 x 2.0 x 1.5 cm, both have granular wound beds, no purulence or signs of infection, serosanguineous exudate, extends to deep fascia with exposed tendon  Edema improving  Pulses palpable Assessment & Plan:  Patient was evaluated and treated and all questions answered.   -Had a long discussion with patient and her husband that it would be best to accelerate secondary wound closure here.  They have agreed to stay until a wound VAC device can be delivered to the room, I have ordered this through [facility] and paperwork is filled out and put on the chart -Okay to discharge from my standpoint as soon as wound VAC has been applied for home unit -We will need home nursing 3 times weekly for wound VAC changes with black sponge, orders placed -Follow-up with me on Monday 12/13/2020 at my [place] office on [address], [place], [place] at 815 a.m. -Appreciate recommendations from infectious disease for 2 weeks of antibiotics as well given negative culture and presence of large amounts of pus with atypical presentation. -The patient did tell me today she has issues with chronic leukocytosis and I think this would explain her elevated white count, she was worked up for this for from a lymphoma standpoint which was negative, there is no explanation for this from her primary care doctor.  Edwin Cap, DPM  Accessible via secure chat for questions or concerns.

## 2020-12-08 NOTE — Anesthesia Postprocedure Evaluation (Signed)
Anesthesia Post Note  Patient: Emily Collins  Procedure(s) Performed: INCISION AND DRAINAGE (Right )  Patient location during evaluation: PACU Anesthesia Type: General Level of consciousness: awake and alert Pain management: pain level controlled Vital Signs Assessment: post-procedure vital signs reviewed and stable Respiratory status: spontaneous breathing, nonlabored ventilation, respiratory function stable and patient connected to nasal cannula oxygen Cardiovascular status: blood pressure returned to baseline and stable Postop Assessment: no apparent nausea or vomiting Anesthetic complications: no   No complications documented.   Last Vitals:  Vitals:   12/07/20 1950 12/07/20 2206  BP: (!) 146/69 (!) 139/59  Pulse: 82 80  Resp: 18   Temp: 36.6 C 36.8 C  SpO2: 95% 92%    Last Pain:  Vitals:   12/07/20 2206  TempSrc: Oral  PainSc:                  Corinda Gubler

## 2020-12-08 NOTE — Progress Notes (Signed)
Inpatient Diabetes Program Recommendations  AACE/ADA: New Consensus Statement on Inpatient Glycemic Control   Target Ranges:  Prepandial:   less than 140 mg/dL      Peak postprandial:   less than 180 mg/dL (1-2 hours)      Critically ill patients:  140 - 180 mg/dL   Results for Emily Collins, Emily Collins (MRN [id number]) as of 12/08/2020 07:30  Ref. Range 12/07/2020 02:55 12/07/2020 03:27 12/07/2020 04:18 12/07/2020 06:15 12/07/2020 07:35 12/07/2020 11:40 12/07/2020 16:00 12/07/2020 17:09 12/07/2020 18:56 12/07/2020 20:56 12/07/2020 22:07 12/08/2020 03:46  Glucose-Capillary Latest Ref Range: 70 - 99 mg/dL 42 (LL) 65 (L) 95 409 (H) 138 (H)  Novolog 2 units  Levemir 50 units 78 69 (L) 121 (H) 85       Decadron 5 mg @18 :10 72 164 (H) 454 (H)  Novolog 14 units   Review of Glycemic Control  Diabetes history: DM2 Outpatient Diabetes medications: Levemir 60 units QAM, Levemir 40 units QPM, Humalog (1 unit for 30 grams of carbs, 1 unit for every 30 mg/dl above target glucose of 100 mg/dl), Trulicity 3 mg Qweek Current orders for Inpatient glycemic control:  Lantus 50 units daily, Novolog 0-20 units TID with meals, Novolog 0-5 units QHS   NOTE: Noted patient received Decadron 5 mg on 12/07/20 at 18:10 which is contributing to hyperglycemia. No other steroids ordered at this time. Will continue to follow along.  Thanks, 12/09/20, RN, MSN, CDE Diabetes Coordinator Inpatient Diabetes Program [phone] (Team Pager from 8am to 5pm)

## 2020-12-08 NOTE — Consult Note (Signed)
Pharmacy Antibiotic Note  Emily Collins is a 55 y.o. female admitted on 11/29/2020 with cellulitis/ankle abscess. Initial imaging negative for osteomyelitis. Patient underwent I&D on 3/9 and repeated 3/15. First cultures negative for growth. Patient on day 10 of IV antibiotics (see below) . Pharmacy has been consulted for unasyn dosing.  Plan:  Unasyn 3 gram Q6H   Monitor renal function  Follow up duration of antibiotics plan/transition to oral therapy  Height: 5\' 2"  (157.5 cm) Weight: 103.8 kg (228 lb 13.4 oz) IBW/kg (Calculated) : 50.1  Temp (24hrs), Avg:98 F (36.7 C), Min:97.1 F (36.2 C), Max:98.6 F (37 C)  Recent Labs  Lab 12/04/20 0410 12/05/20 0420 12/06/20 0425 12/07/20 0421 12/08/20 0439 12/08/20 0614 12/08/20 1106  WBC 14.2* 12.0* 12.5* 11.6* 16.2*  --   --   CREATININE 1.63* 1.33* 1.17* 1.10* 1.28* 1.24* 1.19*    Estimated Creatinine Clearance: 60.4 mL/min (A) (by C-G formula based on SCr of 1.19 mg/dL (H)).    Allergies  Allergen Reactions  . Atorvastatin     Muscle and join pains.  12/10/20  [Levofloxacin In D5w]     GI upset  . Simvastatin     joint aches.  . Dilaudid  [Hydromorphone Hcl] Hives, Itching and Rash  . Tetracycline Rash    Antimicrobials this admission: 3/7 vancomycin >> 3/12 3/7 zosyn >> 3/9 3/8 metronidazole >> 3/11 3/8 Ceftriaxone >> 3/15 3/16 Unasyn >>  Dose adjustments this admission: n/a  Microbiology results: 3/7 BCx: NGTD 3/9 Wound Cx: no growth   Thank you for allowing pharmacy to be a part of this patient's care.  5/9, PharmD, BCPS Clinical Pharmacist  12/08/2020 5:51 PM

## 2020-12-08 NOTE — Progress Notes (Signed)
Patient ID: Emily Collins, female   DOB: 04-Nov-1965, 55 y.o.   MRN: [id number] [facility] PROGRESS NOTE  Han Lysne QMG:[id number] DOB: [birth year] DOA: 11/29/2020 PCP: Oswaldo Conroy, MD  HPI/Subjective: Patient feeling better and wants to go home.  Objective: Vitals:   12/08/20 1147 12/08/20 1540  BP: 128/89 136/62  Pulse: 90 74  Resp: 16 18  Temp: 98.6 F (37 C) 98.2 F (36.8 C)  SpO2: 98% 98%    Intake/Output Summary (Last 24 hours) at 12/08/2020 1723 Last data filed at 12/08/2020 1343 Gross per 24 hour  Intake 810 ml  Output 5 ml  Net 805 ml   Filed Weights   11/29/20 1301 11/30/20 0353  Weight: 104 kg 103.8 kg    ROS: Review of Systems  Respiratory: Negative for shortness of breath.   Cardiovascular: Negative for chest pain.  Gastrointestinal: Negative for abdominal pain, nausea and vomiting.   Exam: Physical Exam HENT:     Head: Normocephalic.     Mouth/Throat:     Pharynx: No oropharyngeal exudate.  Eyes:     General: Lids are normal.     Conjunctiva/sclera: Conjunctivae normal.     Pupils: Pupils are equal, round, and reactive to light.  Cardiovascular:     Rate and Rhythm: Normal rate and regular rhythm.     Heart sounds: Normal heart sounds, S1 normal and S2 normal.  Pulmonary:     Breath sounds: Normal breath sounds. No decreased breath sounds, wheezing, rhonchi or rales.  Abdominal:     Palpations: Abdomen is soft.     Tenderness: There is no abdominal tenderness.  Musculoskeletal:     Right lower leg: Swelling present.     Left lower leg: No swelling.  Skin:    General: Skin is warm.     Comments: Right leg covered.  Neurological:     Mental Status: She is alert and oriented to person, place, and time.       Data Reviewed: Basic Metabolic Panel: Recent Labs  Lab 12/06/20 0425 12/07/20 0421 12/08/20 0439 12/08/20 0614 12/08/20 1106  NA 137 137 133* 134* 133*  K 4.6 4.8 7.0* 6.2* 5.0  CL 109 109 105 105 104   CO2 22 23 24 23 23   GLUCOSE 75 86 500* 470* 196*  BUN 24* 20 23* 23* 22*  CREATININE 1.17* 1.10* 1.28* 1.24* 1.19*  CALCIUM 7.9* 8.3* 7.9* 8.0* 8.1*   CBC: Recent Labs  Lab 12/04/20 0410 12/05/20 0420 12/06/20 0425 12/07/20 0421 12/08/20 0439  WBC 14.2* 12.0* 12.5* 11.6* 16.2*  HGB 8.7* 8.7* 8.4* 9.0* 8.5*  HCT 28.2* 28.3* 27.5* 29.3* 28.0*  MCV 89.0 89.6 89.9 89.3 90.6  PLT 634* 591* 573* 578* 534*    CBG: Recent Labs  Lab 12/07/20 2207 12/08/20 0346 12/08/20 0731 12/08/20 1145 12/08/20 1631  GLUCAP 164* 454* 362* 176* 242*    Recent Results (from the past 240 hour(s))  SARS CORONAVIRUS 2 (TAT 6-24 HRS) Nasopharyngeal Nasopharyngeal Swab     Status: None   Collection Time: 11/29/20  4:20 PM   Specimen: Nasopharyngeal Swab  Result Value Ref Range Status   SARS Coronavirus 2 NEGATIVE NEGATIVE Final    Comment: (NOTE) SARS-CoV-2 target nucleic acids are NOT DETECTED.  The SARS-CoV-2 RNA is generally detectable in upper and lower respiratory specimens during the acute phase of infection. Negative results do not preclude SARS-CoV-2 infection, do not rule out co-infections with other pathogens, and should not be used as the  sole basis for treatment or other patient management decisions. Negative results must be combined with clinical observations, patient history, and epidemiological information. The expected result is Negative.  Fact Sheet for Patients: HairSlick.no  Fact Sheet for Healthcare Providers: quierodirigir.com  This test is not yet approved or cleared by the [place] FDA and  has been authorized for detection and/or diagnosis of SARS-CoV-2 by FDA under an Emergency Use Authorization (EUA). This EUA will remain  in effect (meaning this test can be used) for the duration of the COVID-19 declaration under Se ction 564(b)(1) of the Act, 21 U.S.C. section 360bbb-3(b)(1), unless the authorization  is terminated or revoked sooner.  Performed at [facility], [address], [place], [place] [address]   Aerobic/Anaerobic Culture w Gram Stain (surgical/deep wound)     Status: None   Collection Time: 12/01/20  6:56 PM   Specimen: Wound  Result Value Ref Range Status   Specimen Description   Final    ABSCESS RIGHT ANKLE CULTURE Performed at [facility], [address], [place], [place] [address]    Special Requests   Final    NONE Performed at [facility], [address] [place], [place] [address]    Gram Stain   Final    ABUNDANT WBC PRESENT, PREDOMINANTLY PMN NO ORGANISMS SEEN    Culture   Final    No growth aerobically or anaerobically. Performed at [facility], [address] [place], [place] [address]    Report Status 12/07/2020 FINAL  Final  MRSA PCR Screening     Status: None   Collection Time: 12/03/20  3:18 PM   Specimen: Nasopharyngeal  Result Value Ref Range Status   MRSA by PCR NEGATIVE NEGATIVE Final    Comment:        The GeneXpert MRSA Assay (FDA approved for NASAL specimens only), is one component of a comprehensive MRSA colonization surveillance program. It is not intended to diagnose MRSA infection nor to guide or monitor treatment for MRSA infections. Performed at [facility], [address] [place], [place] [address]       Scheduled Meds: . amitriptyline  25 mg Oral QHS  . aspirin EC  81 mg Oral Daily  . atorvastatin  40 mg Oral Daily  . calcium carbonate  1 tablet Oral BID WC  . docusate sodium  100 mg Oral BID  . enoxaparin (LOVENOX) injection  0.5 mg/kg Subcutaneous Q24H  . famotidine  40 mg Oral Daily  . FLUoxetine  40 mg Oral Daily  . insulin aspart  0-20 Units Subcutaneous TID WC  . insulin aspart  0-5 Units Subcutaneous QHS  . insulin glargine  50 Units Subcutaneous Daily  . ipratropium  2 puff Inhalation Q4H  . loratadine  10 mg Oral Daily  . montelukast  10 mg Oral QHS  .  pantoprazole  40 mg Oral Daily  . pregabalin  150 mg Oral Daily   Continuous Infusions: . sodium chloride 50 mL/hr at 12/07/20 2039  . cefTRIAXone (ROCEPHIN)  IV 2 g (12/07/20 1516)    Assessment/Plan:  1. Right ankle abscess, leukocytosis.  Previous MRI negative for osteomyelitis.  Initial blood cultures negative.  Patient had two surgeries during hospital course and last being yesterday.  Awaiting for podiatry reevaluation today.  First OR showed nothing growing out of the cultures.  Initially was on vancomycin Zosyn and Flagyl and now just on Rocephin.  Will change antibiotic to Unasyn for now.  Potentially can go home on Augmentin and Bactrim. 2. Hyperkalemia.  Did give a dose of Lokelma today.  Potassium barely elevated this morning on IV fluids.  Was treated with IV medications earlier today.  Repeat potassium 5.0. 3. Acute kidney injury on chronic kidney disease stage IIIa.  Creatinine down to 1.19 today.  Creatinine peaked at 1.93 on 12/03/2020. 4. Type 2 diabetes.  Patient on glargine insulin 50 units daily.  At home takes 60 in the morning and 40 in the evening.  Patient had a low sugar yesterday.  High sugar this morning.  Continue to trend.  Continue sliding scale insulin.  Appreciate diabetes coordinator following also. 5. Depression on fluoxetine 6. Morbid obesity with a BMI of 41.8. 7. Thrombocytosis likely reactive with infection 8. Anemia of chronic disease transfuse 1 unit of packed red blood cells.    Code Status:     Code Status Orders  (From admission, onward)         Start     Ordered   11/29/20 2202  Full code  Continuous        11/29/20 2202        Code Status History    Date Active Date Inactive Code Status Order ID Comments User Context   04/26/2019 2040 05/06/2019 1935 Full Code [id number]  Enedina Finner, MD Inpatient   Advance Care Planning Activity     Family Communication: Husband at bedside Disposition Plan: Status is: Inpatient  Dispo: The patient  is from: Home              Anticipated d/c is to: Home              Patient currently awaiting to see podiatry reevaluation of the wound prior to making any decisions   Difficult to place patient.  No.  Consultants:  Podiatry  Procedures:  2 right ankle surgeries  Antibiotics:  Currently on IV Rocephin  Time spent: 30 minutes  [employer]

## 2020-12-08 NOTE — Progress Notes (Signed)
Pt BS 362, she voices concerns of taking 20 units, states her home scale says to take 8.5 units of insulin. MD weiting made aware. He orders her 8.5 units instead of 20.

## 2020-12-09 DIAGNOSIS — E119 Type 2 diabetes mellitus without complications: Secondary | ICD-10-CM

## 2020-12-09 DIAGNOSIS — D75839 Thrombocytosis, unspecified: Secondary | ICD-10-CM

## 2020-12-09 DIAGNOSIS — D649 Anemia, unspecified: Secondary | ICD-10-CM | POA: Diagnosis not present

## 2020-12-09 DIAGNOSIS — D638 Anemia in other chronic diseases classified elsewhere: Secondary | ICD-10-CM

## 2020-12-09 DIAGNOSIS — L089 Local infection of the skin and subcutaneous tissue, unspecified: Secondary | ICD-10-CM

## 2020-12-09 DIAGNOSIS — E10649 Type 1 diabetes mellitus with hypoglycemia without coma: Secondary | ICD-10-CM

## 2020-12-09 DIAGNOSIS — E11628 Type 2 diabetes mellitus with other skin complications: Secondary | ICD-10-CM | POA: Diagnosis not present

## 2020-12-09 DIAGNOSIS — N179 Acute kidney failure, unspecified: Secondary | ICD-10-CM | POA: Diagnosis not present

## 2020-12-09 DIAGNOSIS — F419 Anxiety disorder, unspecified: Secondary | ICD-10-CM

## 2020-12-09 LAB — CBC WITH DIFFERENTIAL/PLATELET
Abs Immature Granulocytes: 0.34 10*3/uL — ABNORMAL HIGH (ref 0.00–0.07)
Basophils Absolute: 0.1 10*3/uL (ref 0.0–0.1)
Basophils Relative: 1 %
Eosinophils Absolute: 0.6 10*3/uL — ABNORMAL HIGH (ref 0.0–0.5)
Eosinophils Relative: 4 %
HCT: 25.9 % — ABNORMAL LOW (ref 36.0–46.0)
Hemoglobin: 8 g/dL — ABNORMAL LOW (ref 12.0–15.0)
Immature Granulocytes: 3 %
Lymphocytes Relative: 32 %
Lymphs Abs: 4.3 10*3/uL — ABNORMAL HIGH (ref 0.7–4.0)
MCH: 27.6 pg (ref 26.0–34.0)
MCHC: 30.9 g/dL (ref 30.0–36.0)
MCV: 89.3 fL (ref 80.0–100.0)
Monocytes Absolute: 0.6 10*3/uL (ref 0.1–1.0)
Monocytes Relative: 5 %
Neutro Abs: 7.4 10*3/uL (ref 1.7–7.7)
Neutrophils Relative %: 55 %
Platelets: 489 10*3/uL — ABNORMAL HIGH (ref 150–400)
RBC: 2.9 MIL/uL — ABNORMAL LOW (ref 3.87–5.11)
RDW: 16 % — ABNORMAL HIGH (ref 11.5–15.5)
Smear Review: NORMAL
WBC: 13.4 10*3/uL — ABNORMAL HIGH (ref 4.0–10.5)
nRBC: 0 % (ref 0.0–0.2)

## 2020-12-09 LAB — BASIC METABOLIC PANEL
Anion gap: 6 (ref 5–15)
BUN: 20 mg/dL (ref 6–20)
CO2: 26 mmol/L (ref 22–32)
Calcium: 8.1 mg/dL — ABNORMAL LOW (ref 8.9–10.3)
Chloride: 106 mmol/L (ref 98–111)
Creatinine, Ser: 1.11 mg/dL — ABNORMAL HIGH (ref 0.44–1.00)
GFR, Estimated: 59 mL/min — ABNORMAL LOW (ref >60)
Glucose, Bld: 88 mg/dL (ref 70–99)
Potassium: 4.2 mmol/L (ref 3.5–5.1)
Sodium: 138 mmol/L (ref 135–145)

## 2020-12-09 LAB — GLUCOSE, CAPILLARY
Glucose-Capillary: 55 mg/dL — ABNORMAL LOW (ref 70–99)
Glucose-Capillary: 48 mg/dL — ABNORMAL LOW (ref 70–99)
Glucose-Capillary: 66 mg/dL — ABNORMAL LOW (ref 70–99)
Glucose-Capillary: 91 mg/dL (ref 70–99)
Glucose-Capillary: 97 mg/dL (ref 70–99)
Glucose-Capillary: 248 mg/dL — ABNORMAL HIGH (ref 70–99)
Glucose-Capillary: 247 mg/dL — ABNORMAL HIGH (ref 70–99)

## 2020-12-09 MED ORDER — LEVEMIR FLEXTOUCH 100 UNIT/ML ~~LOC~~ SOPN: 40.0000 [IU] | PEN_INJECTOR | Freq: Every day | SUBCUTANEOUS | 11 refills | Status: DC

## 2020-12-09 MED ORDER — INSULIN ASPART 100 UNIT/ML ~~LOC~~ SOLN
3.0000 [IU] | Freq: Three times a day (TID) | SUBCUTANEOUS | Status: DC
  Administered 2020-12-09: 3 [IU] via SUBCUTANEOUS

## 2020-12-09 MED ORDER — LINEZOLID 600 MG PO TABS
600.0000 mg | ORAL_TABLET | Freq: Two times a day (BID) | ORAL | Status: DC
  Filled 2020-12-09: qty 1

## 2020-12-09 MED ORDER — HYDROCODONE-ACETAMINOPHEN 5-325 MG PO TABS: 1.0000 | ORAL_TABLET | Freq: Four times a day (QID) | ORAL | 0 refills | Status: DC | PRN

## 2020-12-09 MED ORDER — AMOXICILLIN-POT CLAVULANATE 875-125 MG PO TABS: 1.0000 | ORAL_TABLET | Freq: Two times a day (BID) | ORAL | Status: DC

## 2020-12-09 MED ORDER — INSULIN ASPART 100 UNIT/ML ~~LOC~~ SOLN
0.0000 [IU] | Freq: Three times a day (TID) | SUBCUTANEOUS | Status: DC
  Administered 2020-12-09: 3 [IU] via SUBCUTANEOUS
  Filled 2020-12-09: qty 1

## 2020-12-09 MED ORDER — AMOXICILLIN-POT CLAVULANATE 875-125 MG PO TABS: 1.0000 | ORAL_TABLET | Freq: Two times a day (BID) | ORAL | 0 refills | Status: AC

## 2020-12-09 MED ORDER — INSULIN GLARGINE 100 UNIT/ML ~~LOC~~ SOLN
40.0000 [IU] | Freq: Every day | SUBCUTANEOUS | Status: DC
  Administered 2020-12-09: 40 [IU] via SUBCUTANEOUS
  Filled 2020-12-09 (×2): qty 0.4

## 2020-12-09 MED ORDER — FLUOXETINE HCL 20 MG PO CAPS: 20.0000 mg | ORAL_CAPSULE | Freq: Every day | ORAL | Status: DC

## 2020-12-09 MED ORDER — LINEZOLID 600 MG PO TABS: 600.0000 mg | ORAL_TABLET | Freq: Two times a day (BID) | ORAL | 0 refills | Status: AC

## 2020-12-09 MED ORDER — HUMALOG KWIKPEN 100 UNIT/ML ~~LOC~~ SOPN: 5.0000 [IU] | PEN_INJECTOR | Freq: Three times a day (TID) | SUBCUTANEOUS | 12 refills | Status: DC

## 2020-12-09 MED ORDER — INSULIN GLARGINE 100 UNIT/ML ~~LOC~~ SOLN
45.0000 [IU] | Freq: Every day | SUBCUTANEOUS | Status: DC
  Filled 2020-12-09: qty 0.45

## 2020-12-09 NOTE — Progress Notes (Signed)
Pt had a low blood glucose of 55. Protocol was followed and repeated several times. Pt also ate a sandwich and peanut butter crackers however blood sugar remained low 48mg /dL and a recheck of 47mg /dL. Pt is asymptomatic. Pt states that her blood glucose randomly bottoms out. At 0247, blood glucose was 66mg /dL. Will continue to monitor. Medical Provider on Call informed via secure chat. Will continue to monitor for safety.

## 2020-12-09 NOTE — Progress Notes (Signed)
Wound vac applied by chris rn. Avs given to pt, reviewed meds/activity/f/u appointments/ and wound care. Pt provided with wet to dry gauze and abd's and educated on wound vac.

## 2020-12-09 NOTE — Progress Notes (Signed)
Inpatient Diabetes Program Recommendations  AACE/ADA: New Consensus Statement on Inpatient Glycemic Control   Target Ranges:  Prepandial:   less than 140 mg/dL      Peak postprandial:   less than 180 mg/dL (1-2 hours)      Critically ill patients:  140 - 180 mg/dL  Results for BAYLEIGH, LOFLIN ANN (MRN [id number]) as of 12/09/2020 07:19  Ref. Range 12/09/2020 01:17 12/09/2020 02:15 12/09/2020 02:45 12/09/2020 05:04  Glucose-Capillary Latest Ref Range: 70 - 99 mg/dL 55 (L) 48 (L) 66 (L) 91   Results for ZOANNE, NEWILL ANN (MRN [id number]) as of 12/09/2020 07:19  Ref. Range 12/08/2020 07:31 12/08/2020 11:45 12/08/2020 16:31 12/08/2020 19:43  Glucose-Capillary Latest Ref Range: 70 - 99 mg/dL 859 (H) 292 (H) 446 (H) 223 (H)   Review of Glycemic Control  Diabetes history: DM2 Outpatient Diabetes medications: Levemir 60 units QAM, Levemir 40 units QPM, Humalog (1 unit for 30 grams of carbs, 1 unit for every 30 mg/dl above target glucose of 100 mg/dl), Trulicity 3 mg Qweek Current orders for Inpatient glycemic control:  Lantus 45 units daily, Novolog 0-0 units TID with meals  Inpatient Diabetes Program Recommendations:    Insulin: Please decrease Lantus further to 40 units daily and consider ordering Novolog 3 units TID with meals for meal coverage if patient eats at least 50% of meals.  NOTE: Noted patient received Decadron 5 mg on 12/07/20 at 18:10 which contributed to hyperglycemia on 12/08/20.  Thanks, Orlando Penner, RN, MSN, CDE Diabetes Coordinator Inpatient Diabetes Program [phone] (Team Pager from 8am to 5pm)

## 2020-12-09 NOTE — Discharge Summary (Signed)
[facility] -  at [facility]   PATIENT NAME: Emily Collins    MR#:  [id number]  DATE OF BIRTH:  Jan 04, 1966  DATE OF ADMISSION:  11/29/2020 ADMITTING PHYSICIAN: Lurline Del, MD  DATE OF DISCHARGE: 12/09/2020  4:31 PM  PRIMARY CARE PHYSICIAN: Oswaldo Conroy, MD    ADMISSION DIAGNOSIS:  Cellulitis [L03.90] Right leg pain [M79.604] Cellulitis of right lower extremity [L03.115]  DISCHARGE DIAGNOSIS:  Active Problems:   Cellulitis   Right leg pain   Ulcer of right heel and midfoot with fat layer exposed (HCC)   Abscess of right leg   Uncontrolled type 2 diabetes mellitus with hyperglycemia (HCC)   Cellulitis and abscess of foot, except toes   Leukocytosis   Hyperkalemia   Acute kidney injury superimposed on CKD (HCC)   Obesity, Class III, BMI 40-49.9 (morbid obesity) (HCC)   SECONDARY DIAGNOSIS:   Past Medical History:  Diagnosis Date  . Allergy   . Anxiety   . Arthritis   . Asthma   . Depression   . Diabetes mellitus without complication (HCC)   . GERD (gastroesophageal reflux disease)   . History of chicken pox 04/07/2015   DID have Chicken Pox.    . Hyperlipidemia   . Hypertension   . Neuromuscular disorder (HCC)   . Venous (peripheral) insufficiency     HOSPITAL COURSE:   1.  Right ankle abscess and leukocytosis.  MRI negative for osteomyelitis and did show fluid collection.  Patient went to the operating room on 12/01/2020 by Dr. Lilian Kapur and pus was removed. Patient went back to the operating room on 315 patient had a incision and drainage with irrigation and debridement of the right ankle.  Unfortunately nothing grew out of the operative cultures.  The patient was on Keflex and another antibiotic prior to coming into the hospital.  Patient received aggressive antibiotics with vancomycin Zosyn and Flagyl initially and then was on Rocephin when I took over the case.  We switch the patient over to IV Unasyn.Case discussed with  infectious disease specialist and we switched over to Zyvox and Augmentin upon discharge for 2 more weeks of antibiotics.  The patient will follow up closely with podiatry on Monday.  Will follow up with infectious disease.  White blood cell count upon discharge 13.4. Wound VAC delivered to the room and patient will go home with a wound VAC.  Home health will change every 3 days. 2.  Type 1 diabetes mellitus with hyperglycemia and also hypoglycemia on this hospital stay.  We decrease Levemir insulin to 40 units daily.  Patient does do Humalog prior to meals on sliding scale.  Follow-up with Dr. Tedd Sias as outpatient. 3.  Hyperkalemia yesterday.  Patient did have a dose of Lokelma and IV medications.  Potassium much improved today at 4.2. 4.  Acute kidney injury on chronic kidney disease stage IIIa creatinine peaked at 1.93 on 12/03/2020.  Creatinine down to 1.11 upon disposition today. 5.  Depression.  Case discussed with infectious disease specialist and infectious disease pharmacist.  We will decrease the dose of fluoxetine to 20 mg daily since the patient is on Zyvox.  I did mention to the patient of possibility of serotonin syndrome with Zyvox and SSRIs.  Signs would be hypothermia, agitation, tremor flushing diaphoresis and muscle rigidity. 6.  Morbid obesity.  BMI 41.8 7.  Thrombocytosis likely reactive with infection 8.  Anemia of chronic disease.  Patient was transfused 1 unit of packed red blood cells  during the hospital course continue iron.  DISCHARGE CONDITIONS:   Satisfactory  CONSULTS OBTAINED:  Treatment Team:  Lynn Ito, MD  DRUG ALLERGIES:   Allergies  Allergen Reactions  . Atorvastatin     Muscle and join pains.  Barbera Setters  [Levofloxacin In D5w]     GI upset  . Simvastatin     joint aches.  . Dilaudid  [Hydromorphone Hcl] Hives, Itching and Rash  . Tetracycline Rash    DISCHARGE MEDICATIONS:   Allergies as of 12/09/2020      Reactions   Atorvastatin     Muscle and join pains.   Levaquin  [levofloxacin In D5w]    GI upset   Simvastatin    joint aches.   Dilaudid  [hydromorphone Hcl] Hives, Itching, Rash   Tetracycline Rash      Medication List    STOP taking these medications   Cephalexin 250 MG tablet   co-enzyme Q-10 30 MG capsule   furosemide 40 MG tablet Commonly known as: LASIX   gabapentin 600 MG tablet Commonly known as: NEURONTIN   Lantus SoloStar 100 UNIT/ML Solostar Pen Generic drug: insulin glargine   lisinopril 5 MG tablet Commonly known as: ZESTRIL   meloxicam 15 MG tablet Commonly known as: MOBIC   methocarbamol 500 MG tablet Commonly known as: Robaxin   rosuvastatin 5 MG tablet Commonly known as: CRESTOR     TAKE these medications   amitriptyline 25 MG tablet Commonly known as: ELAVIL TAKE 1 TABLET BY MOUTH NIGHTLY AS NEEDED FOR NEUROPATHY PAIN   amoxicillin-clavulanate 875-125 MG tablet Commonly known as: AUGMENTIN Take 1 tablet by mouth every 12 (twelve) hours for 14 days.   aspirin 81 MG tablet Take 81 mg by mouth daily.   atorvastatin 40 MG tablet Commonly known as: LIPITOR Take 40 mg by mouth daily.   Atrovent HFA 17 MCG/ACT inhaler Generic drug: ipratropium INHALE 2 PUFFS BY MOUTH FOUR TIMES DAILY FOR COPD   cetirizine 10 MG tablet Commonly known as: ZYRTEC TAKE 1 TABLET(10 MG) BY MOUTH DAILY What changed: See the new instructions.   famotidine 40 MG tablet Commonly known as: PEPCID Take 40 mg by mouth daily.   ferrous sulfate 325 (65 FE) MG EC tablet Take 1 tablet (325 mg total) by mouth 2 (two) times daily.   FLUoxetine 20 MG capsule Commonly known as: PROZAC Take 1 capsule (20 mg total) by mouth daily. Start taking on: December 10, 2020 What changed:   medication strength  how much to take   fluticasone 50 MCG/ACT nasal spray Commonly known as: FLONASE SHAKE WELL AND USE 2 SPRAYS IN EACH NOSTRIL DAILY   HumaLOG KwikPen 100 UNIT/ML KwikPen Generic drug: insulin  lispro Inject 5 Units into the skin 3 (three) times daily with meals. INJ UP TO 100 UNI Cruger PER DAY AS PER MD INSTRUCTIONS What changed: how to take this   HYDROcodone-acetaminophen 5-325 MG tablet Commonly known as: NORCO/VICODIN Take 1 tablet by mouth every 6 (six) hours as needed for severe pain. What changed: reasons to take this   hydrOXYzine 10 MG tablet Commonly known as: ATARAX/VISTARIL Take 10 mg by mouth 3 (three) times daily as needed.   InPen 100-Blue-Lilly Devi Generic drug: injection device for insulin Use daily as needed with Humalog cartridges. BLUE   Levemir FlexTouch 100 UNIT/ML FlexPen Generic drug: insulin detemir Inject 40 Units into the skin daily. What changed:   how much to take  when to take this  additional instructions  linezolid 600 MG tablet Commonly known as: ZYVOX Take 1 tablet (600 mg total) by mouth every 12 (twelve) hours for 14 days.   Melatonin 10 MG Tabs Take by mouth.   montelukast 10 MG tablet Commonly known as: SINGULAIR TAKE 1 TABLET BY MOUTH AT BEDTIME   omeprazole 40 MG capsule Commonly known as: PRILOSEC Take 40 mg by mouth 2 (two) times daily.   pregabalin 150 MG capsule Commonly known as: LYRICA Take 150 mg by mouth daily.   ProAir HFA 108 (90 Base) MCG/ACT inhaler Generic drug: albuterol INHALE 2 PUFFS INTO THE LUNGS EVERY 6 HOURS AS NEEDED FOR WHEEZING OR SHORTNESS OF BREATH   Trulicity 3 MG/0.5ML Sopn Generic drug: Dulaglutide INJECT 0.5 ML UNDER THE SKIN EVERY 7 DAYS            Durable Medical Equipment  (From admission, onward)         Start     Ordered   12/08/20 1808  For home use only DME Negative pressure wound device  Once       Question Answer Comment  Frequency of dressing change 3 times per week   Length of need 3 Months   Dressing type Foam   Amount of suction 125 mm/Hg   Pressure application Continuous pressure   Supplies 10 canisters and 15 dressings per month for duration of therapy       12/08/20 1809           DISCHARGE INSTRUCTIONS:   Follow-up podiatry on Monday Follow-up infectious disease 10 days Follow-up PMD  If you experience worsening of your admission symptoms, develop shortness of breath, life threatening emergency, suicidal or homicidal thoughts you must seek medical attention immediately by calling 911 or calling your MD immediately  if symptoms less severe.  You Must read complete instructions/literature along with all the possible adverse reactions/side effects for all the Medicines you take and that have been prescribed to you. Take any new Medicines after you have completely understood and accept all the possible adverse reactions/side effects.   Please note  You were cared for by a hospitalist during your hospital stay. If you have any questions about your discharge medications or the care you received while you were in the hospital after you are discharged, you can call the unit and asked to speak with the hospitalist on call if the hospitalist that took care of you is not available. Once you are discharged, your primary care physician will handle any further medical issues. Please note that NO REFILLS for any discharge medications will be authorized once you are discharged, as it is imperative that you return to your primary care physician (or establish a relationship with a primary care physician if you do not have one) for your aftercare needs so that they can reassess your need for medications and monitor your lab values.    Today   CHIEF COMPLAINT:   Chief Complaint  Patient presents with  . Leg Pain    HISTORY OF PRESENT ILLNESS:  Emily Collins  is a 55 y.o. female came in with leg pain   VITAL SIGNS:  Blood pressure (!) 155/65, pulse 77, temperature 98.9 F (37.2 C), resp. rate 19, height 5\' 2"  (1.575 m), weight 103.8 kg, SpO2 99 %.   PHYSICAL EXAMINATION:  GENERAL:  55 y.o.-year-old patient lying in the bed with no acute  distress.  EYES: Pupils equal, round, reactive to light and accommodation. No scleral icterus.  HEENT: Head atraumatic, normocephalic.  LUNGS: Normal breath sounds bilaterally, no wheezing, rales,rhonchi or crepitation. No use of accessory muscles of respiration.  CARDIOVASCULAR: S1, S2 normal. No murmurs, rubs, or gallops.  ABDOMEN: Soft, non-tender, non-distended.  EXTREMITIES: No pedal edema.  NEUROLOGIC: Cranial nerves II through XII are intact. Muscle strength 5/5 in all extremities. Sensation intact. Gait not checked.  PSYCHIATRIC: The patient is alert and oriented x 3.  SKIN:     Photos of wound upon discharge  DATA REVIEW:   CBC Recent Labs  Lab 12/09/20 0525  WBC 13.4*  HGB 8.0*  HCT 25.9*  PLT 489*    Chemistries  Recent Labs  Lab 12/09/20 0525  NA 138  K 4.2  CL 106  CO2 26  GLUCOSE 88  BUN 20  CREATININE 1.11*  CALCIUM 8.1*     Microbiology Results  Results for orders placed or performed during the hospital encounter of 11/29/20  SARS CORONAVIRUS 2 (TAT 6-24 HRS) Nasopharyngeal Nasopharyngeal Swab     Status: None   Collection Time: 11/29/20  4:20 PM   Specimen: Nasopharyngeal Swab  Result Value Ref Range Status   SARS Coronavirus 2 NEGATIVE NEGATIVE Final    Comment: (NOTE) SARS-CoV-2 target nucleic acids are NOT DETECTED.  The SARS-CoV-2 RNA is generally detectable in upper and lower respiratory specimens during the acute phase of infection. Negative results do not preclude SARS-CoV-2 infection, do not rule out co-infections with other pathogens, and should not be used as the sole basis for treatment or other patient management decisions. Negative results must be combined with clinical observations, patient history, and epidemiological information. The expected result is Negative.  Fact Sheet for Patients: HairSlick.no  Fact Sheet for Healthcare Providers: quierodirigir.com  This test  is not yet approved or cleared by the [place] FDA and  has been authorized for detection and/or diagnosis of SARS-CoV-2 by FDA under an Emergency Use Authorization (EUA). This EUA will remain  in effect (meaning this test can be used) for the duration of the COVID-19 declaration under Se ction 564(b)(1) of the Act, 21 U.S.C. section 360bbb-3(b)(1), unless the authorization is terminated or revoked sooner.  Performed at [facility], [address] [place], [place] [address]   Aerobic/Anaerobic Culture w Gram Stain (surgical/deep wound)     Status: None   Collection Time: 12/01/20  6:56 PM   Specimen: Wound  Result Value Ref Range Status   Specimen Description   Final    ABSCESS RIGHT ANKLE CULTURE Performed at [facility], [address], [place], [place] [address]    Special Requests   Final    NONE Performed at [facility], [address] [place], [place] [address]    Gram Stain   Final    ABUNDANT WBC PRESENT, PREDOMINANTLY PMN NO ORGANISMS SEEN    Culture   Final    No growth aerobically or anaerobically. Performed at [facility], [address], [place], [place] [address]    Report Status 12/07/2020 FINAL  Final  MRSA PCR Screening     Status: None   Collection Time: 12/03/20  3:18 PM   Specimen: Nasopharyngeal  Result Value Ref Range Status   MRSA by PCR NEGATIVE NEGATIVE Final    Comment:        The GeneXpert MRSA Assay (FDA approved for NASAL specimens only), is one component of a comprehensive MRSA colonization surveillance program. It is not intended to diagnose MRSA infection nor to guide or monitor treatment for  MRSA infections. Performed at [facility], [address] [place], [place] [address]     Management plans discussed with the patient, family and they are in agreement.  CODE STATUS:     Code Status Orders  (From admission, onward)         Start     Ordered   11/29/20 2202  Full  code  Continuous        11/29/20 2202        Code Status History    Date Active Date Inactive Code Status Order ID Comments User Context   04/26/2019 2040 05/06/2019 1935 Full Code [id number]  Enedina FinnerPatel, Sona, MD Inpatient   Advance Care Planning Activity      TOTAL TIME TAKING CARE OF THIS PATIENT: 35 minutes.    Alford Highlandichard Lemma Tetro M.D on 12/09/2020 at 5:21 PM  Between 7am to 6pm - Pager - [phone]  After 6pm go to [email or web address] - password [email or web address]  [facility]  CC: Primary care physician; Oswaldo ConroyBender, Abby Daneele, MD

## 2020-12-09 NOTE — TOC Transition Note (Signed)
Transition of Care [facility]) - CM/SW Discharge Note   Patient Details  Name: Emily Collins MRN: [id number] Date of Birth: [birth year]  [facility] CM/SW Contact:  Barrie Dunker, RN Phone Number: 12/09/2020, 11:20 AM   Clinical Narrative:    Patient is ready to go to [facility] the patient at 1 30 PM, Nurse is aware, DC packet ready CM called Sondra the patient's sisiter to alert her of the patient being taken to [facility] today     Barriers to Discharge: Continued Medical Work up   Patient Goals and CMS Choice Patient states their goals for this hospitalization and ongoing recovery are:: home with home health CMS Medicare.gov Compare Post Acute Care list provided to:: Patient Choice offered to / list presented to : Patient  Discharge Placement                       Discharge Plan and Services                          HH Arranged: RN          Social Determinants of Health (SDOH) Interventions     Readmission Risk Interventions No flowsheet data found.

## 2020-12-09 NOTE — Consult Note (Addendum)
NAME: Judie PetitLorie Ann Hink  DOB: Jul 17, 1966  MRN: [id number]  Date/Time: 12/09/2020 12:27 PM  REQUESTING PROVIDER: Dr. Renae GlossWieting  Subjective:  REASON FOR CONSUL: Rt  foot infection  ? Pallas Graciella Freernn Dwyer is a 55 y.o. with a history of Poorly controlled diabetes mellitus, GERD, hyperlipidemia presents to the hospital on 11/29/2020 with right heel ulcer and worsening swelling and redness to the leg for the past week. Patient states she sustained a cut on the left heel a few weeks before.  She had used over-the-counter skin spray to close the cut.  Many days later she started noticing swelling and redness extending up the leg.  Patient came to the ED on 11/23/2020  but left before being seen.  She then went to [facility] and was given Keflex and Bactrim on 11/26/2020.  She started taking the medication but did not feel any improvement and came to the ED on 11/29/2020.  In the ED vitals temperature 97.8, BP 137/63, respiratory 20, pulse rate 88, sats 100%.  Labs revealed WBC 16.1, Hb 8.5, platelets 548 and creatinine 1.65.  MRI done on 11/29/2020 showed small superficial soft tissue ulceration of over the posteromedial aspect of the calcaneus with diffuse ankle edema and cellulitis.  There was a partially rim-enhancing fluid collection at the anterior aspect of the ankle surrounding the extensor tendons in total measuring approximately 5.9 X 2.2 x 3.7 cm she received a dose of vancomycin and piperacillin tazobactam and then it was switched over to ceftriaxone Flagyl and Vanco.  She was seen by podiatry and was taken for incision and drainage on 12/01/2020..   Culture was negative.  She was taken back for further incision and drainage with irrigation and debridement on 12/07/2020.  The skin sutures from the previous surgery were removed.  On both the sides on the medial and lateral aspect where opened with blunt dissection.  The surgical wound was not closed.  She is waiting for wound vac placement.I am asked to  see her for antibiotic recommendation  Past Medical History:  Diagnosis Date  . Allergy   . Anxiety   . Arthritis   . Asthma   . Depression   . Diabetes mellitus without complication (HCC)   . GERD (gastroesophageal reflux disease)   . History of chicken pox 04/07/2015   DID have Chicken Pox.    . Hyperlipidemia   . Hypertension   . Neuromuscular disorder (HCC)   . Venous (peripheral) insufficiency     Past Surgical History:  Procedure Laterality Date  . APPENDECTOMY  1979  . BILATERAL CARPAL TUNNEL RELEASE Bilateral    Dr. Rosita KeaMenz  . CESAREAN SECTION  2003  . INCISION AND DRAINAGE Right 12/01/2020   Procedure: INCISION AND DRAINAGE;  Surgeon: Edwin CapMcDonald, Adam R, DPM;  Location: [facility] ORS;  Service: Podiatry;  Laterality: Right;  . INCISION AND DRAINAGE Right 12/07/2020   Procedure: INCISION AND DRAINAGE;  Surgeon: Felecia ShellingEvans, Brent M, DPM;  Location: [facility] ORS;  Service: Podiatry;  Laterality: Right;  . LAPAROSCOPIC LYSIS OF ADHESIONS    . SHOULDER SURGERY Right 08/2009   Dr. Rosita KeaMenz  . TUBAL LIGATION      Social History   Socioeconomic History  . Marital status: Married    Spouse name: Jonny RuizJohn  . Number of children: 3  . Years of education: H/S  . Highest education level: Not on file  Occupational History  . Occupation: Housewife  Tobacco Use  . Smoking status: Former Smoker  Packs/day: 1.00    Years: 30.00    Pack years: 30.00    Types: Cigarettes    Start date: 04/06/1985    Quit date: 09/25/2017    Years since quitting: 3.2  . Smokeless tobacco: Never Used  Vaping Use  . Vaping Use: Never used  Substance and Sexual Activity  . Alcohol use: Yes    Alcohol/week: 1.0 - 2.0 standard drink    Types: 1 - 2 Glasses of wine per week    Comment: Socially  . Drug use: No  . Sexual activity: Not on file  Other Topics Concern  . Not on file  Social History Narrative  . Not on file   Social Determinants of Health   Financial Resource Strain: Not on file  Food Insecurity: Not on  file  Transportation Needs: Not on file  Physical Activity: Not on file  Stress: Not on file  Social Connections: Not on file  Intimate Partner Violence: Not on file    Family History  Problem Relation Age of Onset  . Hypertension Mother   . Diabetes Mother   . Hyperlipidemia Mother   . Arthritis Mother   . Heart disease Father   . Kidney disease Father    Allergies  Allergen Reactions  . Atorvastatin     Muscle and join pains.  [employer] Setters  [Levofloxacin In D5w]     GI upset  . Simvastatin     joint aches.  . Dilaudid  [Hydromorphone Hcl] Hives, Itching and Rash  . Tetracycline Rash   I? Current Facility-Administered Medications  Medication Dose Route Frequency Provider Last Rate Last Admin  . 0.9 %  sodium chloride infusion   Intravenous Continuous Edwin Cap, DPM 50 mL/hr at 12/09/20 0355 Infusion Verify at 12/09/20 0355  . acetaminophen (TYLENOL) tablet 650 mg  650 mg Oral Q4H PRN [name], DPM      . albuterol (VENTOLIN HFA) 108 (90 Base) MCG/ACT inhaler 2 puff  2 puff Inhalation Q6H PRN McDonald, Rachelle Hora, DPM      . amitriptyline (ELAVIL) tablet 25 mg  25 mg Oral QHS Edwin Cap, DPM   25 mg at 12/08/20 2042  . Ampicillin-Sulbactam (UNASYN) 3 g in sodium chloride 0.9 % 100 mL IVPB  3 g Intravenous Q6H Cheron Every E, RPH 200 mL/hr at 12/09/20 1043 3 g at 12/09/20 1043  . aspirin EC tablet 81 mg  81 mg Oral Daily Edwin Cap, DPM   81 mg at 12/09/20 1036  . atorvastatin (LIPITOR) tablet 40 mg  40 mg Oral Daily McDonald, Adam R, DPM   40 mg at 12/09/20 1036  . calcium carbonate (TUMS - dosed in mg elemental calcium) chewable tablet 200 mg of elemental calcium  1 tablet Oral BID WC McDonald, Adam R, DPM   200 mg of elemental calcium at 12/09/20 1037  . docusate sodium (COLACE) capsule 100 mg  100 mg Oral BID Edwin Cap, DPM   100 mg at 12/09/20 1036  . enoxaparin (LOVENOX) injection 52.5 mg  0.5 mg/kg Subcutaneous Q24H Sharl Ma R, DPM   52.5  mg at 12/08/20 2042  . famotidine (PEPCID) tablet 40 mg  40 mg Oral Daily Edwin Cap, DPM   40 mg at 12/08/20 1845  . FLUoxetine (PROZAC) capsule 40 mg  40 mg Oral Daily McDonald, Adam R, DPM   40 mg at 12/09/20 1037  . HYDROcodone-acetaminophen (NORCO) 10-325 MG per tablet 1-2 tablet  1-2  tablet Oral Q4H PRN Charise Killian, MD   2 tablet at 12/09/20 1223  . hydrOXYzine (ATARAX/VISTARIL) tablet 10 mg  10 mg Oral TID PRN Edwin Cap, DPM      . insulin aspart (novoLOG) injection 0-9 Units  0-9 Units Subcutaneous TID WC Wieting, Richard, MD      . insulin aspart (novoLOG) injection 3 Units  3 Units Subcutaneous TID WC Wieting, Richard, MD      . insulin glargine (LANTUS) injection 40 Units  40 Units Subcutaneous Daily Alford Highland, MD   40 Units at 12/09/20 1037  . ipratropium (ATROVENT HFA) inhaler 2 puff  2 puff Inhalation Q4H Sharl Ma R, DPM   2 puff at 12/09/20 1037  . loratadine (CLARITIN) tablet 10 mg  10 mg Oral Daily Edwin Cap, DPM   10 mg at 12/09/20 1036  . montelukast (SINGULAIR) tablet 10 mg  10 mg Oral QHS Edwin Cap, DPM   10 mg at 12/08/20 2043  . morphine 4 MG/ML injection 4 mg  4 mg Intravenous Q3H PRN Edwin Cap, DPM   4 mg at 12/08/20 2042  . ondansetron (ZOFRAN) tablet 4 mg  4 mg Oral Q6H PRN McDonald, Rachelle Hora, DPM       Or  . ondansetron (ZOFRAN) injection 4 mg  4 mg Intravenous Q6H PRN McDonald, Rachelle Hora, DPM      . pantoprazole (PROTONIX) EC tablet 40 mg  40 mg Oral Daily McDonald, Adam R, DPM   40 mg at 12/09/20 1036  . pregabalin (LYRICA) capsule 150 mg  150 mg Oral Daily Sharl Ma R, DPM   150 mg at 12/09/20 1036  . sodium chloride flush (NS) 0.9 % injection 3 mL  3 mL Intravenous PRN [name], DPM         Abtx:  Anti-infectives (From admission, onward)   Start     Dose/Rate Route Frequency Ordered Stop   12/08/20 2000  Ampicillin-Sulbactam (UNASYN) 3 g in sodium chloride 0.9 % 100 mL IVPB        3 g 200 mL/hr over  30 Minutes Intravenous Every 6 hours 12/08/20 1750     12/01/20 1903  gentamicin (GARAMYCIN) injection  Status:  Discontinued          As needed 12/01/20 1903 12/01/20 1932   11/30/20 1400  cefTRIAXone (ROCEPHIN) 2 g in sodium chloride 0.9 % 100 mL IVPB  Status:  Discontinued        2 g 200 mL/hr over 30 Minutes Intravenous Every 24 hours 11/30/20 0947 12/08/20 1739   11/30/20 1400  metroNIDAZOLE (FLAGYL) IVPB 500 mg  Status:  Discontinued        500 mg 100 mL/hr over 60 Minutes Intravenous Every 8 hours 11/30/20 0947 12/03/20 1314   11/30/20 0600  vancomycin (VANCOREADY) IVPB 750 mg/150 mL  Status:  Discontinued        750 mg 150 mL/hr over 60 Minutes Intravenous Every 24 hours 11/30/20 0250 12/05/20 1230   11/30/20 0300  piperacillin-tazobactam (ZOSYN) IVPB 3.375 g  Status:  Discontinued        3.375 g 12.5 mL/hr over 240 Minutes Intravenous Every 8 hours 11/30/20 0218 11/30/20 0947   11/29/20 1615  piperacillin-tazobactam (ZOSYN) IVPB 3.375 g        3.375 g 100 mL/hr over 30 Minutes Intravenous  Once 11/29/20 1601 11/29/20 1654   11/29/20 1615  vancomycin (VANCOREADY) IVPB 2000 mg/400 mL  2,000 mg 200 mL/hr over 120 Minutes Intravenous  Once 11/29/20 1608 11/29/20 1838      REVIEW OF SYSTEMS:  Const: negative fever, negative chills, negative weight loss Eyes: negative diplopia or visual changes, negative eye pain ENT: negative coryza, negative sore throat Resp: negative cough, hemoptysis, dyspnea Cards: negative for chest pain, palpitations, lower extremity edema GU: negative for frequency, dysuria and hematuria GI: Negative for abdominal pain, diarrhea, bleeding, constipation Skin: negative for rash and pruritus Heme: negative for easy bruising and gum/nose bleeding MS: rt foot swelling  Neurolo:negative for headaches, dizziness, vertigo, memory problems  Psych: negative for feelings of anxiety, depression  Endocrine: negative for thyroid, diabetes Allergy/Immunology-  as above? : Objective:  VITALS:  BP (!) 155/65 (BP Location: Right Arm)   Pulse 77   Temp 98.9 F (37.2 C)   Resp 19   Ht  (1.575 m)   Wt 103.8 kg   SpO2 99%   BMI 41.85 kg/m  PHYSICAL EXAM:  General: Alert, cooperative, no distress, appears stated age.  Head: Normocephalic, without obvious abnormality, atraumatic. Eyes: Conjunctivae clear, anicteric sclerae. Pupils are equal ENT Nares normal. No drainage or sinus tenderness. Lips, mucosa, and tongue normal. No Thrush Neck: Supple, symmetrical, no adenopathy, thyroid: non tender no carotid bruit and no JVD. Back: No CVA tenderness. Lungs: Clear to auscultation bilaterally. No Wheezing or Rhonchi. No rales. Heart: Regular rate and rhythm, no murmur, rub or gallop. Abdomen: Soft, non-tender,not distended. Bowel sounds normal. No masses Extremities: rt foot- surgical woud- tendon exposed on the rt side Edema ++ Calcaneal dry ulcer                Skin: No rashes or lesions. Or bruising Lymph: Cervical, supraclavicular normal. Neurologic: Grossly non-focal Pertinent Labs Lab Results CBC      Component Value Date/Time   WBC 13.4 (H) 12/09/2020 0525   RBC 2.90 (L) 12/09/2020 0525   HGB 8.0 (L) 12/09/2020 0525   HCT 25.9 (L) 12/09/2020 0525   PLT 489 (H) 12/09/2020 0525   MCV 89.3 12/09/2020 0525   MCH 27.6 12/09/2020 0525   MCHC 30.9 12/09/2020 0525   RDW 16.0 (H) 12/09/2020 0525   LYMPHSABS 4.3 (H) 12/09/2020 0525   MONOABS 0.6 12/09/2020 0525   EOSABS 0.6 (H) 12/09/2020 0525   BASOSABS 0.1 12/09/2020 0525    CMP Latest Ref Rng & Units 12/09/2020 12/08/2020 12/08/2020  Glucose 70 - 99 mg/dL 88 914(N) 829(F)  BUN 6 - 20 mg/dL 20 62(Z) 30(Q)  Creatinine 0.44 - 1.00 mg/dL 6.57(Q) 4.69(G) 2.95(M)  Sodium 135 - 145 mmol/L 138 133(L) 134(L)  Potassium 3.5 - 5.1 mmol/L 4.2 5.0 6.2(H)  Chloride 98 - 111 mmol/L 106 104 105  CO2 22 - 32 mmol/L Calcium 8.9 - 10.3 mg/dL 8.1(L) 8.1(L) 8.0(L)   Total Protein 6.5 - 8.1 g/dL - - -  Total Bilirubin 0.3 - 1.2 mg/dL - - -  Alkaline Phos 38 - 126 U/L - - -  AST 15 - 41 U/L - - -  ALT 0 - 44 U/L - - -      Microbiology: Recent Results (from the past 240 hour(s))  SARS CORONAVIRUS 2 (TAT 6-24 HRS) Nasopharyngeal Nasopharyngeal Swab     Status: None   Collection Time: 11/29/20  4:20 PM   Specimen: Nasopharyngeal Swab  Result Value Ref Range Status   SARS Coronavirus 2 NEGATIVE NEGATIVE Final    Comment: (NOTE) SARS-CoV-2 target nucleic acids are  NOT DETECTED.  The SARS-CoV-2 RNA is generally detectable in upper and lower respiratory specimens during the acute phase of infection. Negative results do not preclude SARS-CoV-2 infection, do not rule out co-infections with other pathogens, and should not be used as the sole basis for treatment or other patient management decisions. Negative results must be combined with clinical observations, patient history, and epidemiological information. The expected result is Negative.  Fact Sheet for Patients: HairSlick.no  Fact Sheet for Healthcare Providers: quierodirigir.com  This test is not yet approved or cleared by the [place] FDA and  has been authorized for detection and/or diagnosis of SARS-CoV-2 by FDA under an Emergency Use Authorization (EUA). This EUA will remain  in effect (meaning this test can be used) for the duration of the COVID-19 declaration under Se ction 564(b)(1) of the Act, 21 U.S.C. section 360bbb-3(b)(1), unless the authorization is terminated or revoked sooner.  Performed at [facility], [address], [place], [place] [address]   Aerobic/Anaerobic Culture w Gram Stain (surgical/deep wound)     Status: None   Collection Time: 12/01/20  6:56 PM   Specimen: Wound  Result Value Ref Range Status   Specimen Description   Final    ABSCESS RIGHT ANKLE CULTURE Performed at [facility], [address] [place], [place] [address]    Special Requests   Final    NONE Performed at [facility], [address] [place], [place] [address]    Gram Stain   Final    ABUNDANT WBC PRESENT, PREDOMINANTLY PMN NO ORGANISMS SEEN    Culture   Final    No growth aerobically or anaerobically. Performed at [facility], [address] [place], [place] [address]    Report Status 12/07/2020 FINAL  Final  MRSA PCR Screening     Status: None   Collection Time: 12/03/20  3:18 PM   Specimen: Nasopharyngeal  Result Value Ref Range Status   MRSA by PCR NEGATIVE NEGATIVE Final    Comment:        The GeneXpert MRSA Assay (FDA approved for NASAL specimens only), is one component of a comprehensive MRSA colonization surveillance program. It is not intended to diagnose MRSA infection nor to guide or monitor treatment for MRSA infections. Performed at [facility], [address] [place], [place] [address]     IMAGING RESULTS: MRI ankle reviewed Small superficial soft tissue ulceration overlies the posteromedial aspect of the calcaneus with diffuse ankle edema/cellulitis. No underlying sinus tract or fluid collection. Negative for acute osteomyelitis. 2. Partially rim-enhancing fluid collection at the anterior aspect of the ankle surrounding the extensor tendons in total measuring approximately 5.9 x 2.2 x 3.7 cm.  I have personally reviewed the films ? Impression/Recommendation Rt foot infection with uncontrolled diabetes mellitus- s/p debridement X2 Some necrosis at the edge of one of the wounds. np vascular studies were done Culture negative from the surgical debridement . Pt was on 2 days of keflex+ bactrim prior to that. Will send her on Augmentin + linezolid for 2 weeks Will follow her as OP  Depression/anxiety- pt on prozac, amitryptiline- watch closely for serotonin syndrome while on linezolid  DM- Hba1c >10 On insulin,  trulicity  Anemia  AKI on CKD ? ? ?Discussed with patient regarding sideeffects on linezolid, foods and medications to avoid- gave her material ___________________________________________________ Discussed with patient, requesting provider Note:  This document was prepared using Dragon voice recognition software and may include unintentional  dictation errors.

## 2020-12-09 NOTE — TOC Transition Note (Signed)
Transition of Care [facility]) - CM/SW Discharge Note   Patient Details  Name: Emily Collins MRN: [id number] Date of Birth: [birth year]  [facility]) CM/SW Contact:  Barrie Dunker, RN Phone Number: 12/09/2020, 4:05 PM   Clinical Narrative:     Patient received wound vac and is set up with [facility] health, Fax number [phone], CM faxed the Home helth wound vac orders to [facility] They are aware of the patient DC    Barriers to Discharge: Continued Medical Work up   Patient Goals and CMS Choice Patient states their goals for this hospitalization and ongoing recovery are:: home with home health CMS Medicare.gov Compare Post Acute Care list provided to:: Patient Choice offered to / list presented to : Patient  Discharge Placement                       Discharge Plan and Services                          HH Arranged: RN          Social Determinants of Health (SDOH) Interventions     Readmission Risk Interventions No flowsheet data found.

## 2020-12-10 ENCOUNTER — Telehealth: Payer: Self-pay

## 2020-12-10 NOTE — Telephone Encounter (Signed)
Patient called, stated "the vac was killing my leg.  It felt like it was sucking my bones out of my leg"  She informed me that she removed the vac this morning and cleaned with saline from the hospital and wrapped with gauze. I informed Dr. Lilian Kapur.  Per his instructions, she is to redress daily with wet to dry saline gauze, wrap with rolled gauze and ace bandage.  She is to keep follow up appt with Dr. Lilian Kapur this coming Monday morning.   All instructions along with the importance of the wound vac were given and explained to patient and she verbalized instructions and understanding.  I informed her that if she has any problems or concerns over the weekend to call on call doctor.  She verbalized understanding

## 2020-12-13 ENCOUNTER — Ambulatory Visit (INDEPENDENT_AMBULATORY_CARE_PROVIDER_SITE_OTHER): Payer: Medicaid Other | Admitting: Podiatry

## 2020-12-13 ENCOUNTER — Other Ambulatory Visit: Payer: Self-pay

## 2020-12-13 ENCOUNTER — Encounter: Payer: Self-pay | Admitting: Podiatry

## 2020-12-13 VITALS — BP 128/67 | HR 77 | Temp 97.8°F

## 2020-12-13 DIAGNOSIS — S91001D Unspecified open wound, right ankle, subsequent encounter: Secondary | ICD-10-CM | POA: Diagnosis not present

## 2020-12-13 DIAGNOSIS — L97412 Non-pressure chronic ulcer of right heel and midfoot with fat layer exposed: Secondary | ICD-10-CM

## 2020-12-13 DIAGNOSIS — E10649 Type 1 diabetes mellitus with hypoglycemia without coma: Secondary | ICD-10-CM

## 2020-12-13 DIAGNOSIS — S96901D Unspecified injury of unspecified muscle and tendon at ankle and foot level, right foot, subsequent encounter: Secondary | ICD-10-CM

## 2020-12-13 DIAGNOSIS — I700 Atherosclerosis of aorta: Secondary | ICD-10-CM | POA: Insufficient documentation

## 2020-12-13 MED ORDER — OXYCODONE-ACETAMINOPHEN 5-325 MG PO TABS: 1.0000 | ORAL_TABLET | ORAL | 0 refills | Status: AC | PRN

## 2020-12-13 MED ORDER — MUPIROCIN 2 % EX OINT: 1.0000 "application " | TOPICAL_OINTMENT | Freq: Two times a day (BID) | CUTANEOUS | 2 refills | Status: DC

## 2020-12-13 NOTE — Patient Instructions (Addendum)
Change wound VAC three times a week with adaptic or non adherent layer, black sponge, run continuous pressure at  Mupirocin ointment to heel with adhesive dressing

## 2020-12-13 NOTE — Progress Notes (Signed)
  Subjective:  Patient ID: Emily Collins, female    DOB: 10/07/1965,  MRN: [id number]  Chief Complaint  Patient presents with  . Routine Post Op    POV#1 dos 12/01/2020  s/p I&D RLE    DOS: 12/01/20 right ankle I&D, 12/07/20 right ankle secondary washout   55 y.o. female returns for post-op check.  She states that the [facility] was not currently painful for her and she had to take it off on Friday and has been changing with saline wet-to-dry dressing since then.  Review of Systems: Negative except as noted in the HPI. Denies N/V/F/Ch.   Objective:   Vitals:   12/13/20 0836  BP: 128/67  Pulse: 77  Temp: 97.8 F (36.6 C)   There is no height or weight on file to calculate BMI. Constitutional Well developed. Well nourished.  Vascular Foot warm and well perfused. Capillary refill normal to all digits.   Neurologic Normal speech. Oriented to person, place, and time. Epicritic sensation to light touch grossly reduced bilaterally.  Dermatologic  fibro-granular wound beds with exposed deep fascia  Orthopedic: Tenderness to palpation noted about the surgical site.           Assessment:   1. Type 1 diabetes mellitus with hypoglycemia without coma (HCC)   2. Morbid obesity (HCC)   3. Ulcer of right heel and midfoot with fat layer exposed (HCC)   4. Open wound of right ankle with tendon involvement, subsequent encounter    Plan:  Patient was evaluated and treated and all questions answered.  I emphasized the importance of the wound VAC and the rationale behind it that it should allow this to close secondarily as fast as possible.  I think saline wet-to-dry dressings be changed daily will also be painful for her and will take quite a long time.  She agreed to go back to the wound VAC to allow this to granulate in.  This was reapplied today by myself in the office.  She will return for follow-up in 2 weeks, VAC changes every 3 days by home health nurse.  Return in about 2 weeks  (around 12/27/2020) for wound re-check.

## 2020-12-21 ENCOUNTER — Telehealth: Payer: Self-pay

## 2020-12-21 ENCOUNTER — Ambulatory Visit: Payer: Medicaid Other | Attending: Infectious Diseases | Admitting: Infectious Diseases

## 2020-12-21 ENCOUNTER — Other Ambulatory Visit: Payer: Self-pay

## 2020-12-21 DIAGNOSIS — F419 Anxiety disorder, unspecified: Secondary | ICD-10-CM

## 2020-12-21 DIAGNOSIS — F32A Depression, unspecified: Secondary | ICD-10-CM | POA: Diagnosis not present

## 2020-12-21 DIAGNOSIS — L089 Local infection of the skin and subcutaneous tissue, unspecified: Secondary | ICD-10-CM

## 2020-12-21 DIAGNOSIS — L03115 Cellulitis of right lower limb: Secondary | ICD-10-CM

## 2020-12-21 DIAGNOSIS — L0889 Other specified local infections of the skin and subcutaneous tissue: Secondary | ICD-10-CM

## 2020-12-21 DIAGNOSIS — E11628 Type 2 diabetes mellitus with other skin complications: Secondary | ICD-10-CM | POA: Diagnosis not present

## 2020-12-21 DIAGNOSIS — E1165 Type 2 diabetes mellitus with hyperglycemia: Secondary | ICD-10-CM

## 2020-12-21 NOTE — Progress Notes (Signed)
The purpose of this virtual visit is to provide medical care while limiting exposure to the novel coronavirus (COVID19) for both patient and office staff.   Consent was obtained for VIDEO VISITt:  Yes.   Answered questions that patient had about telehealth interaction:  Yes.   I discussed the limitations, risks, security and privacy concerns of performing an evaluation and management service by telephone. I also discussed with the patient that there may be a patient responsible charge related to this service. The patient expressed understanding and agreed to proceed.   Patient Location: Home Provider Location: Office Patient's husband was also in the visit helping her This is a follow-up after recent hospitalization for right foot infection.  She was admitted to [facility] between 11/29/2020 until 12/09/2020.  Emily Collins is a 55 year old female with a history of poorly controlled diabetes mellitus, GERD, hyperlipidemia presented to the hospital on 11/29/2020 with right heel ulcer and worsening in swelling and redness to the leg for the past week.  As per patient she sustained a cut on the heel a few weeks ago she had used over-the-counter skin spray to close the cut.  Many days later she started noticing swelling and redness extending up the leg.  She first came to the ED but left without being seen.  She then went to her PCP at [facility] clinic and was given Keflex and Bactrim on 11/26/2020.  As it was not improving she came to the ED again on 11/29/2020.  MRI done on 11/29/2020 showed small superficial soft tissue ulceration over the posteromedial aspect of the calcaneus with diffuse ankle edema and cellulitis.  There was also a partially rim-enhancing fluid collection at the anterior aspect of the ankle surrounding the extensor tendons and it was measuring 5.9 into 2.2 into 3.7 cm.  She was started on vancomycin and PIP after blood cultures were sent.  She was seen by podiatrist and  taken for incision and drainage on 12/01/2020 by Dr. Lilian Kapur.  The culture from that drainage was negative.  She was taken back for further incision and drainage with irrigation and debridement on 12/07/2020 and the skin sutures from the previous surgery were removed and she was placed on a wound VAC. I saw her on 12/09/2020 and on discharge she was sent home on linezolid and Augmentin for 2 weeks. Patient stays the wound nurse changes the dressing 3 times a week at home. She has no fever no chills no headache.  Has mild cough with mild yellow sputum.  No chest pain or shortness of breath.  She is nauseous.  Her appetite is poor.  She has couple of semisolid stools every day.  Brown in color no blood or mucus.  She does not have any dysuria.  She has poor energy. She saw Dr. Teresita Madura a podiatrist as outpatient and he was happy with her progress.   On examination BP 154/62] the husband checked her BP at home Pulse 90 Awake and alert.  No distress. Chest on inspection looked okay Right leg on inspection both the surgical wounds were healing.  There was no surrounding redness.  Also the necrotic area has resolved.  The wound base looked healthy. The surrounding leg was not swollen as before. 12/21/20    12/21/20   12/13/20   12/13/20     Labs Patient says her blood glucose in the 300s today  Impression/recommendation Right foot infection with uncontrolled diabetes mellitus.  Status post debridement into 2.  The necrosis that was present at the edge of the wound has resolved.  Culture has been negative from the surgical debridement.  Patient is currently on Augmentin and linezolid and she will be completing 2 weeks on 12/23/2020.  Her antibiotics will be stopped after that.  We will ask her wound nurse to draw labs which will include CBC, CMP and CRP.  Depression/anxiety on Prozac amitriptyline.  No symptoms or signs suggestive of serotonin syndrome  Diabetes mellitus is not well  controlled.  Patient needs to follow-up with her endocrinologist Dr. Tedd Sias and  PCP.  Discussed the management with the patient and her husband. ID will follow her as needed Total time spent  face to face televisit was 18 minutes.

## 2020-12-21 NOTE — Telephone Encounter (Signed)
I spoke with Emily Collins with [facility] [phone]) to give her verbal orders to draw a CRP, CMP and CBC with Diff per Dr. Rivka Safer.  Emily Collins verbalized understanding. Per Emily Collins lab orders can be entered in Epic and they can pull the orders.  Orders have been entered in Epic.  Dorla Guizar T Pricilla Loveless

## 2020-12-23 ENCOUNTER — Telehealth: Payer: Self-pay | Admitting: Infectious Diseases

## 2020-12-23 ENCOUNTER — Telehealth: Payer: Self-pay

## 2020-12-23 NOTE — Telephone Encounter (Signed)
I spoke with patient and advised her that her potassium, creatine and glucose was elevated per Dr. Rivka Safer.  Per Dr. Rivka Safer patient will need to drink more fluids and follow up with her pcp. Patient has been informed and verbalized understanding. I have also faxed her labs over to her pcp Dr. Hessie Diener.  Karion Cudd T Pricilla Loveless

## 2020-12-23 NOTE — Telephone Encounter (Signed)
Pt completed 2 weeks of linezolid and augmentin . Labs done on 12/22/20 showed cr 1.7, K 6 and glucose 215 Pt was recently in the hopsital 3/7 -3/17 and max was 1.93 on 3/14.   Pt says she is on lisinopril . Labs faxed to her PCP- she will reach out to her PCP

## 2020-12-29 ENCOUNTER — Encounter: Payer: Self-pay | Admitting: Podiatry

## 2020-12-29 ENCOUNTER — Other Ambulatory Visit: Payer: Self-pay

## 2020-12-29 ENCOUNTER — Ambulatory Visit (INDEPENDENT_AMBULATORY_CARE_PROVIDER_SITE_OTHER): Payer: Medicaid Other | Admitting: Podiatry

## 2020-12-29 DIAGNOSIS — S91001D Unspecified open wound, right ankle, subsequent encounter: Secondary | ICD-10-CM | POA: Diagnosis not present

## 2020-12-29 DIAGNOSIS — S96901D Unspecified injury of unspecified muscle and tendon at ankle and foot level, right foot, subsequent encounter: Secondary | ICD-10-CM

## 2020-12-29 DIAGNOSIS — L97412 Non-pressure chronic ulcer of right heel and midfoot with fat layer exposed: Secondary | ICD-10-CM

## 2020-12-29 DIAGNOSIS — E10649 Type 1 diabetes mellitus with hypoglycemia without coma: Secondary | ICD-10-CM

## 2020-12-29 MED ORDER — SULFAMETHOXAZOLE-TRIMETHOPRIM 800-160 MG PO TABS: 1.0000 | ORAL_TABLET | Freq: Two times a day (BID) | ORAL | 1 refills | Status: DC

## 2020-12-29 NOTE — Progress Notes (Signed)
  Subjective:  Patient ID: Emily Collins, female    DOB: [birth year],  MRN: [id number]  Chief Complaint  Patient presents with  . Wound Check    "I'm doing ok, no pain, just itchy around the wounds"    DOS: 12/01/20 right ankle I&D, 12/07/20 right ankle secondary washout   55 y.o. female returns for post-op check.  Seems to be doing better she has been tolerating the VAC better.  She has redness and erythema around it thinks it may be irritation from the adhesive from the [employer]  Review of Systems: Negative except as noted in the HPI. Denies N/V/F/Ch.   Objective:   There were no vitals filed for this visit. There is no height or weight on file to calculate BMI. Constitutional Well developed. Well nourished.  Vascular Foot warm and well perfused. Capillary refill normal to all digits.   Neurologic Normal speech. Oriented to person, place, and time. Epicritic sensation to light touch grossly reduced bilaterally.  Dermatologic  fibro-granular wound beds with exposed deep fascia, the TA tendon is present in the medial wound, there is fibrotic material in the base, improved granulation tissue in depth, lateral wound measures 7.5 cm x 3 cm x 1 cm, the medial wound measures 4.5 cm x 7.5 cm x 1 cm, the heel wound measures 2.5 cm x 1 cm x 0.5 cm.  Erythema around the lower leg and ankle, no purulence or malodor no pain with right ankle range of motion  Orthopedic: Tenderness to palpation noted about the surgical site.                 Assessment:   1. Type 1 diabetes mellitus with hypoglycemia without coma (HCC)   2. Ulcer of right heel and midfoot with fat layer exposed (HCC)   3. Open wound of right ankle with tendon involvement, subsequent encounter    Plan:  Patient was evaluated and treated and all questions answered.  Chronic ulcerations right ankle -Debridement as below. -Continue off-loading with CAM boot -Suspect the erythema is likely hypersensitivity to the  adhesive, I left the VAC off today to give her a break before dressing changes on Friday, dressings were applied consisting of Adaptic, Iodosorb and dry sterile dressings -As precaution I am sending her on Bactrim 2 weeks -If not significant improvement in coverage over the TA tendon over the next visit may consider Integra application bilayer to cover the tendon  Procedure: Excisional Debridement of Wound Rationale: Removal of non-viable soft tissue from the wound to promote healing.  Anesthesia: none Pre-Debridement Wound Measurements: lateral wound measures 7.5 cm x 3 cm x 1 cm, the medial wound measures 4.5 cm x 7.5 cm x 1 cm, the heel wound measures 2.5 cm x 1 cm x 0.5 cm. Post-Debridement Wound Measurements: lateral wound measures 7.5 cm x 3 cm x 1 cm, the medial wound measures 4.5 cm x 7.5 cm x 1 cm, the heel wound measures 2.5 cm x 1 cm x 0.5 cm. Type of Debridement: Sharp Excisional Tissue Removed: Non-viable soft tissue Depth of Debridement: subcutaneous tissue. Technique: Sharp excisional debridement to bleeding, viable wound base.  Dressing: Dry, sterile, compression dressing. Disposition: Patient tolerated procedure well.

## 2020-12-29 NOTE — Patient Instructions (Signed)
Resume wound VAC on Friday 12/31/20

## 2021-01-02 ENCOUNTER — Inpatient Hospital Stay: Payer: Medicaid Other

## 2021-01-02 ENCOUNTER — Inpatient Hospital Stay
Admission: EM | Admit: 2021-01-02 | Discharge: 2021-01-08 | DRG: 574 | Disposition: A | Discharge status: Home Health | Payer: Medicaid Other | Attending: Internal Medicine | Admitting: Internal Medicine

## 2021-01-02 ENCOUNTER — Encounter: Payer: Self-pay | Admitting: Emergency Medicine

## 2021-01-02 ENCOUNTER — Other Ambulatory Visit: Payer: Self-pay

## 2021-01-02 DIAGNOSIS — E11628 Type 2 diabetes mellitus with other skin complications: Secondary | ICD-10-CM

## 2021-01-02 DIAGNOSIS — Z8249 Family history of ischemic heart disease and other diseases of the circulatory system: Secondary | ICD-10-CM

## 2021-01-02 DIAGNOSIS — E1065 Type 1 diabetes mellitus with hyperglycemia: Secondary | ICD-10-CM | POA: Diagnosis present

## 2021-01-02 DIAGNOSIS — E10622 Type 1 diabetes mellitus with other skin ulcer: Secondary | ICD-10-CM | POA: Diagnosis present

## 2021-01-02 DIAGNOSIS — N183 Chronic kidney disease, stage 3 unspecified: Secondary | ICD-10-CM | POA: Diagnosis present

## 2021-01-02 DIAGNOSIS — L97319 Non-pressure chronic ulcer of right ankle with unspecified severity: Secondary | ICD-10-CM | POA: Diagnosis present

## 2021-01-02 DIAGNOSIS — Z885 Allergy status to narcotic agent status: Secondary | ICD-10-CM

## 2021-01-02 DIAGNOSIS — K5900 Constipation, unspecified: Secondary | ICD-10-CM | POA: Diagnosis present

## 2021-01-02 DIAGNOSIS — E1042 Type 1 diabetes mellitus with diabetic polyneuropathy: Secondary | ICD-10-CM | POA: Diagnosis present

## 2021-01-02 DIAGNOSIS — Z79899 Other long term (current) drug therapy: Secondary | ICD-10-CM

## 2021-01-02 DIAGNOSIS — L03115 Cellulitis of right lower limb: Principal | ICD-10-CM | POA: Diagnosis present

## 2021-01-02 DIAGNOSIS — Z7982 Long term (current) use of aspirin: Secondary | ICD-10-CM

## 2021-01-02 DIAGNOSIS — E875 Hyperkalemia: Secondary | ICD-10-CM | POA: Diagnosis present

## 2021-01-02 DIAGNOSIS — L97519 Non-pressure chronic ulcer of other part of right foot with unspecified severity: Secondary | ICD-10-CM | POA: Diagnosis present

## 2021-01-02 DIAGNOSIS — L97313 Non-pressure chronic ulcer of right ankle with necrosis of muscle: Secondary | ICD-10-CM | POA: Diagnosis not present

## 2021-01-02 DIAGNOSIS — Z20822 Contact with and (suspected) exposure to covid-19: Secondary | ICD-10-CM | POA: Diagnosis present

## 2021-01-02 DIAGNOSIS — N1831 Chronic kidney disease, stage 3a: Secondary | ICD-10-CM | POA: Diagnosis present

## 2021-01-02 DIAGNOSIS — D649 Anemia, unspecified: Secondary | ICD-10-CM

## 2021-01-02 DIAGNOSIS — J4520 Mild intermittent asthma, uncomplicated: Secondary | ICD-10-CM | POA: Diagnosis not present

## 2021-01-02 DIAGNOSIS — F419 Anxiety disorder, unspecified: Secondary | ICD-10-CM | POA: Diagnosis present

## 2021-01-02 DIAGNOSIS — Z6841 Body Mass Index (BMI) 40.0 and over, adult: Secondary | ICD-10-CM | POA: Diagnosis not present

## 2021-01-02 DIAGNOSIS — Z87891 Personal history of nicotine dependence: Secondary | ICD-10-CM

## 2021-01-02 DIAGNOSIS — F32A Depression, unspecified: Secondary | ICD-10-CM | POA: Diagnosis present

## 2021-01-02 DIAGNOSIS — Z888 Allergy status to other drugs, medicaments and biological substances status: Secondary | ICD-10-CM

## 2021-01-02 DIAGNOSIS — T148XXA Other injury of unspecified body region, initial encounter: Secondary | ICD-10-CM | POA: Diagnosis not present

## 2021-01-02 DIAGNOSIS — Z833 Family history of diabetes mellitus: Secondary | ICD-10-CM | POA: Diagnosis not present

## 2021-01-02 DIAGNOSIS — J45909 Unspecified asthma, uncomplicated: Secondary | ICD-10-CM | POA: Diagnosis present

## 2021-01-02 DIAGNOSIS — Z881 Allergy status to other antibiotic agents status: Secondary | ICD-10-CM

## 2021-01-02 DIAGNOSIS — F418 Other specified anxiety disorders: Secondary | ICD-10-CM | POA: Diagnosis not present

## 2021-01-02 DIAGNOSIS — K219 Gastro-esophageal reflux disease without esophagitis: Secondary | ICD-10-CM | POA: Diagnosis present

## 2021-01-02 DIAGNOSIS — E1022 Type 1 diabetes mellitus with diabetic chronic kidney disease: Secondary | ICD-10-CM | POA: Diagnosis present

## 2021-01-02 DIAGNOSIS — Z794 Long term (current) use of insulin: Secondary | ICD-10-CM

## 2021-01-02 DIAGNOSIS — E10621 Type 1 diabetes mellitus with foot ulcer: Secondary | ICD-10-CM | POA: Diagnosis present

## 2021-01-02 DIAGNOSIS — Z841 Family history of disorders of kidney and ureter: Secondary | ICD-10-CM

## 2021-01-02 DIAGNOSIS — E11621 Type 2 diabetes mellitus with foot ulcer: Secondary | ICD-10-CM | POA: Diagnosis present

## 2021-01-02 DIAGNOSIS — E785 Hyperlipidemia, unspecified: Secondary | ICD-10-CM | POA: Diagnosis present

## 2021-01-02 DIAGNOSIS — Z83438 Family history of other disorder of lipoprotein metabolism and other lipidemia: Secondary | ICD-10-CM

## 2021-01-02 DIAGNOSIS — I129 Hypertensive chronic kidney disease with stage 1 through stage 4 chronic kidney disease, or unspecified chronic kidney disease: Secondary | ICD-10-CM | POA: Diagnosis present

## 2021-01-02 DIAGNOSIS — M79671 Pain in right foot: Secondary | ICD-10-CM | POA: Diagnosis present

## 2021-01-02 DIAGNOSIS — L089 Local infection of the skin and subcutaneous tissue, unspecified: Secondary | ICD-10-CM | POA: Diagnosis not present

## 2021-01-02 LAB — BASIC METABOLIC PANEL
Anion gap: 6 (ref 5–15)
BUN: 25 mg/dL — ABNORMAL HIGH (ref 6–20)
CO2: 23 mmol/L (ref 22–32)
Calcium: 8.3 mg/dL — ABNORMAL LOW (ref 8.9–10.3)
Chloride: 105 mmol/L (ref 98–111)
Creatinine, Ser: 1.41 mg/dL — ABNORMAL HIGH (ref 0.44–1.00)
GFR, Estimated: 44 mL/min — ABNORMAL LOW (ref >60)
Glucose, Bld: 133 mg/dL — ABNORMAL HIGH (ref 70–99)
Potassium: 5.2 mmol/L — ABNORMAL HIGH (ref 3.5–5.1)
Sodium: 134 mmol/L — ABNORMAL LOW (ref 135–145)

## 2021-01-02 LAB — CBC WITH DIFFERENTIAL/PLATELET
Abs Immature Granulocytes: 0.15 10*3/uL — ABNORMAL HIGH (ref 0.00–0.07)
Basophils Absolute: 0.1 10*3/uL (ref 0.0–0.1)
Basophils Relative: 1 %
Eosinophils Absolute: 0.7 10*3/uL — ABNORMAL HIGH (ref 0.0–0.5)
Eosinophils Relative: 3 %
HCT: 31 % — ABNORMAL LOW (ref 36.0–46.0)
Hemoglobin: 9.6 g/dL — ABNORMAL LOW (ref 12.0–15.0)
Immature Granulocytes: 1 %
Lymphocytes Relative: 12 %
Lymphs Abs: 2.3 10*3/uL (ref 0.7–4.0)
MCH: 27 pg (ref 26.0–34.0)
MCHC: 31 g/dL (ref 30.0–36.0)
MCV: 87.3 fL (ref 80.0–100.0)
Monocytes Absolute: 1.2 10*3/uL — ABNORMAL HIGH (ref 0.1–1.0)
Monocytes Relative: 6 %
Neutro Abs: 15.3 10*3/uL — ABNORMAL HIGH (ref 1.7–7.7)
Neutrophils Relative %: 77 %
Platelets: 388 10*3/uL (ref 150–400)
RBC: 3.55 MIL/uL — ABNORMAL LOW (ref 3.87–5.11)
RDW: 16.7 % — ABNORMAL HIGH (ref 11.5–15.5)
WBC: 19.7 10*3/uL — ABNORMAL HIGH (ref 4.0–10.5)
nRBC: 0 % (ref 0.0–0.2)

## 2021-01-02 LAB — LACTIC ACID, PLASMA
Lactic Acid, Venous: 1.3 mmol/L (ref 0.5–1.9)
Lactic Acid, Venous: 1 mmol/L (ref 0.5–1.9)

## 2021-01-02 LAB — GLUCOSE, CAPILLARY
Glucose-Capillary: 141 mg/dL — ABNORMAL HIGH (ref 70–99)
Glucose-Capillary: 312 mg/dL — ABNORMAL HIGH (ref 70–99)

## 2021-01-02 LAB — SEDIMENTATION RATE: Sed Rate: 106 mm/hr — ABNORMAL HIGH (ref 0–30)

## 2021-01-02 LAB — C-REACTIVE PROTEIN: CRP: 13.4 mg/dL — ABNORMAL HIGH (ref <1.0)

## 2021-01-02 LAB — APTT: aPTT: 33 seconds (ref 24–36)

## 2021-01-02 LAB — PROTIME-INR
INR: 1.1 (ref 0.8–1.2)
Prothrombin Time: 13.4 seconds (ref 11.4–15.2)

## 2021-01-02 MED ORDER — TRAMADOL HCL 50 MG PO TABS
50.0000 mg | ORAL_TABLET | Freq: Four times a day (QID) | ORAL | Status: DC | PRN
  Administered 2021-01-02 – 2021-01-03 (×2): 50 mg via ORAL
  Filled 2021-01-02 (×2): qty 1

## 2021-01-02 MED ORDER — SODIUM CHLORIDE 0.9 % IV SOLN
3.0000 g | Freq: Once | INTRAVENOUS | Status: DC
  Filled 2021-01-02: qty 8

## 2021-01-02 MED ORDER — ALBUTEROL SULFATE HFA 108 (90 BASE) MCG/ACT IN AERS
2.0000 | INHALATION_SPRAY | RESPIRATORY_TRACT | Status: DC | PRN
  Filled 2021-01-02: qty 6.7

## 2021-01-02 MED ORDER — SODIUM ZIRCONIUM CYCLOSILICATE 5 G PO PACK
5.0000 g | PACK | Freq: Once | ORAL | Status: AC
  Administered 2021-01-02: 5 g via ORAL
  Filled 2021-01-02: qty 1

## 2021-01-02 MED ORDER — FERROUS SULFATE 325 (65 FE) MG PO TABS
325.0000 mg | ORAL_TABLET | Freq: Two times a day (BID) | ORAL | Status: DC
  Administered 2021-01-03 – 2021-01-08 (×8): 325 mg via ORAL
  Filled 2021-01-02 (×8): qty 1

## 2021-01-02 MED ORDER — PANTOPRAZOLE SODIUM 40 MG PO TBEC
40.0000 mg | DELAYED_RELEASE_TABLET | Freq: Two times a day (BID) | ORAL | Status: DC
  Administered 2021-01-02 – 2021-01-08 (×11): 40 mg via ORAL
  Filled 2021-01-02 (×11): qty 1

## 2021-01-02 MED ORDER — HYDRALAZINE HCL 20 MG/ML IJ SOLN: 5.0000 mg | INTRAMUSCULAR | Status: DC | PRN

## 2021-01-02 MED ORDER — ONDANSETRON HCL 4 MG/2ML IJ SOLN
4.0000 mg | Freq: Once | INTRAMUSCULAR | Status: AC
  Administered 2021-01-02: 4 mg via INTRAVENOUS
  Filled 2021-01-02: qty 2

## 2021-01-02 MED ORDER — VANCOMYCIN HCL 1250 MG/250ML IV SOLN
1250.0000 mg | INTRAVENOUS | Status: DC
  Administered 2021-01-04: 1250 mg via INTRAVENOUS
  Filled 2021-01-02: qty 250

## 2021-01-02 MED ORDER — GADOBUTROL 1 MMOL/ML IV SOLN
10.0000 mL | Freq: Once | INTRAVENOUS | Status: AC | PRN
  Administered 2021-01-02: 10 mL via INTRAVENOUS

## 2021-01-02 MED ORDER — ASPIRIN EC 81 MG PO TBEC
81.0000 mg | DELAYED_RELEASE_TABLET | Freq: Every day | ORAL | Status: DC
  Administered 2021-01-03 – 2021-01-08 (×4): 81 mg via ORAL
  Filled 2021-01-02 (×4): qty 1

## 2021-01-02 MED ORDER — CYCLOBENZAPRINE HCL 10 MG PO TABS
5.0000 mg | ORAL_TABLET | Freq: Every evening | ORAL | Status: DC | PRN
  Administered 2021-01-02 – 2021-01-07 (×2): 5 mg via ORAL
  Filled 2021-01-02 (×2): qty 1

## 2021-01-02 MED ORDER — INSULIN DETEMIR 100 UNIT/ML ~~LOC~~ SOLN
30.0000 [IU] | Freq: Two times a day (BID) | SUBCUTANEOUS | Status: DC
  Administered 2021-01-02 – 2021-01-03 (×2): 30 [IU] via SUBCUTANEOUS
  Filled 2021-01-02 (×5): qty 0.3

## 2021-01-02 MED ORDER — FAMOTIDINE 20 MG PO TABS
40.0000 mg | ORAL_TABLET | Freq: Every day | ORAL | Status: DC
  Administered 2021-01-02 – 2021-01-08 (×6): 40 mg via ORAL
  Filled 2021-01-02 (×6): qty 2

## 2021-01-02 MED ORDER — IPRATROPIUM BROMIDE HFA 17 MCG/ACT IN AERS
2.0000 | INHALATION_SPRAY | Freq: Four times a day (QID) | RESPIRATORY_TRACT | Status: DC
  Administered 2021-01-02 – 2021-01-08 (×20): 2 via RESPIRATORY_TRACT
  Filled 2021-01-02: qty 12.9

## 2021-01-02 MED ORDER — GABAPENTIN 300 MG PO CAPS
600.0000 mg | ORAL_CAPSULE | Freq: Every day | ORAL | Status: DC
  Administered 2021-01-02 – 2021-01-07 (×6): 600 mg via ORAL
  Filled 2021-01-02 (×6): qty 2

## 2021-01-02 MED ORDER — MELOXICAM 7.5 MG PO TABS
15.0000 mg | ORAL_TABLET | Freq: Every day | ORAL | Status: DC
  Filled 2021-01-02: qty 2

## 2021-01-02 MED ORDER — VANCOMYCIN HCL 2000 MG/400ML IV SOLN
2000.0000 mg | Freq: Once | INTRAVENOUS | Status: AC
  Administered 2021-01-02: 2000 mg via INTRAVENOUS
  Filled 2021-01-02: qty 400

## 2021-01-02 MED ORDER — ONDANSETRON HCL 4 MG/2ML IJ SOLN: 4.0000 mg | Freq: Three times a day (TID) | INTRAMUSCULAR | Status: DC | PRN

## 2021-01-02 MED ORDER — AMITRIPTYLINE HCL 25 MG PO TABS
25.0000 mg | ORAL_TABLET | Freq: Every evening | ORAL | Status: DC | PRN
  Administered 2021-01-07: 25 mg via ORAL
  Filled 2021-01-02 (×3): qty 1

## 2021-01-02 MED ORDER — SODIUM CHLORIDE 0.9 % IV BOLUS
1000.0000 mL | Freq: Once | INTRAVENOUS | Status: AC
  Administered 2021-01-02: 1000 mL via INTRAVENOUS

## 2021-01-02 MED ORDER — SODIUM CHLORIDE 0.9 % IV SOLN
3.0000 g | Freq: Four times a day (QID) | INTRAVENOUS | Status: DC
  Administered 2021-01-02 – 2021-01-08 (×22): 3 g via INTRAVENOUS
  Filled 2021-01-02 (×4): qty 8
  Filled 2021-01-02: qty 3
  Filled 2021-01-02: qty 8
  Filled 2021-01-02 (×3): qty 3
  Filled 2021-01-02: qty 8
  Filled 2021-01-02: qty 3
  Filled 2021-01-02 (×3): qty 8
  Filled 2021-01-02 (×4): qty 3
  Filled 2021-01-02 (×2): qty 8
  Filled 2021-01-02 (×4): qty 3
  Filled 2021-01-02: qty 8
  Filled 2021-01-02: qty 3

## 2021-01-02 MED ORDER — INSULIN ASPART 100 UNIT/ML ~~LOC~~ SOLN
0.0000 [IU] | Freq: Three times a day (TID) | SUBCUTANEOUS | Status: DC
  Administered 2021-01-02 – 2021-01-03 (×2): 1 [IU] via SUBCUTANEOUS
  Administered 2021-01-03 – 2021-01-04 (×3): 5 [IU] via SUBCUTANEOUS
  Administered 2021-01-05: 7 [IU] via SUBCUTANEOUS
  Administered 2021-01-05 – 2021-01-06 (×2): 2 [IU] via SUBCUTANEOUS
  Administered 2021-01-06: 3 [IU] via SUBCUTANEOUS
  Administered 2021-01-06 – 2021-01-07 (×2): 5 [IU] via SUBCUTANEOUS
  Administered 2021-01-07: 3 [IU] via SUBCUTANEOUS
  Administered 2021-01-08: 7 [IU] via SUBCUTANEOUS
  Filled 2021-01-02 (×13): qty 1

## 2021-01-02 MED ORDER — DM-GUAIFENESIN ER 30-600 MG PO TB12: 1.0000 | ORAL_TABLET | Freq: Two times a day (BID) | ORAL | Status: DC | PRN

## 2021-01-02 MED ORDER — ATORVASTATIN CALCIUM 20 MG PO TABS
40.0000 mg | ORAL_TABLET | Freq: Every day | ORAL | Status: DC
  Administered 2021-01-02 – 2021-01-07 (×6): 40 mg via ORAL
  Filled 2021-01-02 (×6): qty 2

## 2021-01-02 MED ORDER — HYDROXYZINE HCL 10 MG PO TABS
10.0000 mg | ORAL_TABLET | Freq: Three times a day (TID) | ORAL | Status: DC | PRN
  Administered 2021-01-05: 10 mg via ORAL
  Filled 2021-01-02 (×3): qty 1

## 2021-01-02 MED ORDER — LORATADINE 10 MG PO TABS
10.0000 mg | ORAL_TABLET | Freq: Every day | ORAL | Status: DC
  Administered 2021-01-03 – 2021-01-08 (×5): 10 mg via ORAL
  Filled 2021-01-02 (×5): qty 1

## 2021-01-02 MED ORDER — INSULIN ASPART 100 UNIT/ML ~~LOC~~ SOLN
0.0000 [IU] | Freq: Every day | SUBCUTANEOUS | Status: DC
  Administered 2021-01-05: 3 [IU] via SUBCUTANEOUS
  Administered 2021-01-07: 2 [IU] via SUBCUTANEOUS
  Filled 2021-01-02 (×2): qty 1

## 2021-01-02 MED ORDER — FLUOXETINE HCL 20 MG PO CAPS
20.0000 mg | ORAL_CAPSULE | Freq: Every day | ORAL | Status: DC
  Administered 2021-01-03 – 2021-01-08 (×5): 20 mg via ORAL
  Filled 2021-01-02 (×6): qty 1

## 2021-01-02 MED ORDER — ACETAMINOPHEN 325 MG PO TABS
650.0000 mg | ORAL_TABLET | Freq: Four times a day (QID) | ORAL | Status: DC | PRN
  Administered 2021-01-07: 650 mg via ORAL
  Filled 2021-01-02: qty 2

## 2021-01-02 MED ORDER — FENTANYL CITRATE (PF) 100 MCG/2ML IJ SOLN
50.0000 ug | Freq: Once | INTRAMUSCULAR | Status: DC
  Filled 2021-01-02: qty 2

## 2021-01-02 MED ORDER — HEPARIN SODIUM (PORCINE) 5000 UNIT/ML IJ SOLN
5000.0000 [IU] | Freq: Three times a day (TID) | INTRAMUSCULAR | Status: DC
  Administered 2021-01-02 – 2021-01-07 (×13): 5000 [IU] via SUBCUTANEOUS
  Filled 2021-01-02 (×13): qty 1

## 2021-01-02 MED ORDER — FENTANYL CITRATE (PF) 100 MCG/2ML IJ SOLN
25.0000 ug | INTRAMUSCULAR | Status: DC | PRN
  Administered 2021-01-02 (×2): 25 ug via INTRAVENOUS
  Filled 2021-01-02: qty 2

## 2021-01-02 MED ORDER — SENNOSIDES-DOCUSATE SODIUM 8.6-50 MG PO TABS: 1.0000 | ORAL_TABLET | Freq: Every evening | ORAL | Status: DC | PRN

## 2021-01-02 MED ORDER — MELATONIN 5 MG PO TABS
10.0000 mg | ORAL_TABLET | Freq: Every evening | ORAL | Status: DC | PRN
  Administered 2021-01-07: 10 mg via ORAL
  Filled 2021-01-02: qty 2

## 2021-01-02 NOTE — H&P (Signed)
History and Physical    Emily Collins ZRA:[id number] DOB: 02-02-1966 DOA: 01/02/2021  Referring MD/NP/PA:   PCP: Letta Median, MD   Patient coming from:  The patient is coming from home.  At baseline, pt is independent for most of ADL.        Chief Complaint: right foot ulcer and foot pain  HPI: Emily Collins is a 55 y.o. female with medical history significant of DM-tI, HTN. HLD, asthma, GERD, depression with anxiety, former smoker, CKD-IIIa, anemia, diabetic foot ulcer with infection, presents with right foot ulcer and foot pain.   Patient was recently hospitalized from 3/7-3/17 due to right foot ulcer, right ankle abscess and cellulitis.  Patient is s/p of I&D, currently on VAC. Patient states that her pain in right foot and ankle has been worsening in the past several days.  Patient has increased drainage from two deep wounds of right ankle, worsening erythema in right foot and ankle, extending up to her right lower leg. She also has a small ulcer in right heal. The pain is constant, burning-like, moderate pain, nonradiating. Patient has chills, but no fever.  Denies chest pain, cough, shortness breath, nausea, vomiting, diarrhea or abdominal pain.  No symptoms of UTI.  Patient is on Bactrim since 4/6.  ED Course: pt was found to have WBC 19.7, pending COVID-19 PCR, slightly worsening renal function, potassium of 5.2, blood pressure 100/61, heart rate 89, RR 16, oxygen saturation 97% on room air, temperature normal.  Patient is admitted to [place] bed as inpatient.  ED physician consulted Dr. Sherryle Lis of podiatry.  Review of Systems:   General: no fevers, chills, no body weight gain, has poor appetite, has fatigue HEENT: no blurry vision, hearing changes or sore throat Respiratory: no dyspnea, coughing, wheezing CV: no chest pain, no palpitations GI: no nausea, vomiting, abdominal pain, diarrhea, constipation GU: no dysuria, burning on urination, increased urinary  frequency, hematuria  Ext: no leg edema Neuro: no unilateral weakness, numbness, or tingling, no vision change or hearing loss Skin: has two deep wounds with pus drainage in right ankle, small ulcer in right heel without drainage.  Has pain, swelling, erythema in right foot, right ankle and lower leg MSK: No muscle spasm, no deformity, no limitation of range of movement in spin Heme: No easy bruising.  Travel history: No recent long distant travel.  Allergy:  Allergies  Allergen Reactions  . Atorvastatin     Muscle and join pains.  Mack Hook  [Levofloxacin In D5w]     GI upset  . Simvastatin     joint aches.  . Dilaudid  [Hydromorphone Hcl] Hives, Itching and Rash  . Tetracycline Rash    Past Medical History:  Diagnosis Date  . Allergy   . Anxiety   . Arthritis   . Asthma   . Depression   . Diabetes mellitus without complication ([place])   . GERD (gastroesophageal reflux disease)   . History of chicken pox 04/07/2015   DID have Chicken Pox.    . Hyperlipidemia   . Hypertension   . Neuromuscular disorder ([place])   . Venous (peripheral) insufficiency     Past Surgical History:  Procedure Laterality Date  . APPENDECTOMY  1979  . BILATERAL CARPAL TUNNEL RELEASE Bilateral    Dr. Rudene Christians  . CESAREAN SECTION  2003  . INCISION AND DRAINAGE Right 12/01/2020   Procedure: INCISION AND DRAINAGE;  Surgeon: Criselda Peaches, DPM;  Location: [facility] ORS;  Service: Podiatry;  Laterality:  Right;  Marland Kitchen INCISION AND DRAINAGE Right 12/07/2020   Procedure: INCISION AND DRAINAGE;  Surgeon: Edrick Kins, DPM;  Location: [facility] ORS;  Service: Podiatry;  Laterality: Right;  . LAPAROSCOPIC LYSIS OF ADHESIONS    . SHOULDER SURGERY Right 08/2009   Dr. Rudene Christians  . TUBAL LIGATION      Social History:  reports that she quit smoking about 3 years ago. Her smoking use included cigarettes. She started smoking about 35 years ago. She has a 30.00 pack-year smoking history. She has never used smokeless tobacco. She  reports current alcohol use of about 1.0 - 2.0 standard drink of alcohol per week. She reports that she does not use drugs.  Family History:  Family History  Problem Relation Age of Onset  . Hypertension Mother   . Diabetes Mother   . Hyperlipidemia Mother   . Arthritis Mother   . Heart disease Father   . Kidney disease Father      Prior to Admission medications   Medication Sig Start Date End Date Taking? Authorizing Provider  amitriptyline (ELAVIL) 25 MG tablet TAKE 1 TABLET BY MOUTH NIGHTLY AS NEEDED FOR NEUROPATHY PAIN 11/03/20   [provider]  aspirin 81 MG tablet Take 81 mg by mouth daily.    [provider]  atorvastatin (LIPITOR) 40 MG tablet Take 40 mg by mouth daily. 07/07/20 07/07/21  [provider]  ATROVENT HFA 17 MCG/ACT inhaler INHALE 2 PUFFS BY MOUTH FOUR TIMES DAILY FOR COPD 07/30/20   [provider]  cetirizine (ZYRTEC) 10 MG tablet TAKE 1 TABLET(10 MG) BY MOUTH DAILY Patient taking differently: Take 10 mg by mouth daily. 05/16/17   Birdie Sons, MD  famotidine (PEPCID) 40 MG tablet Take 40 mg by mouth daily. 10/19/20   [provider]  ferrous sulfate 325 (65 FE) MG EC tablet Take 1 tablet (325 mg total) by mouth 2 (two) times daily. 05/06/19 08/26/20  Henreitta Leber, MD  FLUoxetine (PROZAC) 20 MG capsule Take 1 capsule (20 mg total) by mouth daily. 12/10/20   Loletha Grayer, MD  fluticasone (FLONASE) 50 MCG/ACT nasal spray SHAKE WELL AND USE 2 SPRAYS IN EACH NOSTRIL DAILY 04/02/17   Birdie Sons, MD  HUMALOG KWIKPEN 100 UNIT/ML KwikPen Inject 5 Units into the skin 3 (three) times daily with meals. INJ UP TO 100 UNI [place] PER DAY AS PER MD INSTRUCTIONS 12/09/20   Loletha Grayer, MD  hydrOXYzine (ATARAX/VISTARIL) 10 MG tablet Take 10 mg by mouth 3 (three) times daily as needed.    Letta Median, MD  injection device for insulin (INPEN 100-BLUE-LILLY) DEVI Use daily as needed with Humalog cartridges. BLUE 05/05/20    [provider]  LEVEMIR FLEXTOUCH 100 UNIT/ML FlexPen Inject 40 Units into the skin daily. 12/09/20   Loletha Grayer, MD  Melatonin 10 MG TABS Take by mouth.    [provider]  montelukast (SINGULAIR) 10 MG tablet TAKE 1 TABLET BY MOUTH AT BEDTIME Patient taking differently: Take 10 mg by mouth at bedtime. 05/16/17   Birdie Sons, MD  mupirocin ointment (BACTROBAN) 2 % Apply 1 application topically 2 (two) times daily. 12/13/20   McDonald, Stephan Minister, DPM  omeprazole (PRILOSEC) 40 MG capsule Take 40 mg by mouth 2 (two) times daily. 11/21/20   [provider]  pregabalin (LYRICA) 150 MG capsule Take 150 mg by mouth daily. 05/10/20 05/10/21  [provider]  PROAIR HFA 108 (90 Base) MCG/ACT inhaler INHALE 2 PUFFS INTO  THE LUNGS EVERY 6 HOURS AS NEEDED FOR WHEEZING OR SHORTNESS OF BREATH 02/12/17   Birdie Sons, MD  sulfamethoxazole-trimethoprim (BACTRIM DS) 800-160 MG tablet Take 1 tablet by mouth 2 (two) times daily. 12/29/20   McDonald, Stephan Minister, DPM  TRULICITY 3 FU/9.3AT SOPN INJECT 0.5 ML UNDER THE SKIN EVERY 7 DAYS 11/18/20   [provider]    Physical Exam: Vitals:   01/02/21 1454 01/02/21 1456 01/02/21 1744  BP: 100/61  (!) 105/52  Pulse: 89  81  Resp: 16  18  Temp: 98.3 F (36.8 C)    TempSrc: Oral    SpO2: 97%  98%  Weight:  104.3 kg   Height:  _0  (1.575 m)         General: Not in acute distress HEENT:       Eyes: PERRL, EOMI, no scleral icterus.       ENT: No discharge from the ears and nose, no pharynx injection, no tonsillar enlargement.        Neck: No JVD, no bruit, no mass felt. Heme: No neck lymph node enlargement. Cardiac: S1/S2, RRR, No murmurs, No gallops or rubs. Respiratory: No rales, wheezing, rhonchi or rubs. GI: Soft, nondistended, nontender, no rebound pain, no organomegaly, BS present. GU: No hematuria Ext: No pitting leg edema bilaterally. 1+DP/PT pulse bilaterally. Musculoskeletal: No joint deformities,  No joint redness or warmth, no limitation of ROM in spin. Skin: has two deep wounds of right ankle, small ulcer in right heel.  Has pain, swelling, erythema in right foot, right ankle and lower leg.        Neuro: Alert, oriented X3, cranial nerves II-XII grossly intact, moves all extremities normally.  Psych: Patient is not psychotic, no suicidal or hemocidal ideation.  Labs on Admission: I have personally reviewed following labs and imaging studies  CBC: Recent Labs  Lab 01/02/21 1458  WBC 19.7*  NEUTROABS 15.3*  HGB 9.6*  HCT 31.0*  MCV 87.3  PLT 557   Basic Metabolic Panel: Recent Labs  Lab 01/02/21 1458  NA 134*  K 5.2*  CL 105  CO2 23  GLUCOSE 133*  BUN 25*  CREATININE 1.41*  CALCIUM 8.3*   GFR: Estimated Creatinine Clearance: 51.1 mL/min (A) (by C-G formula based on SCr of 1.41 mg/dL (H)). Liver Function Tests: No results for input(s): AST, ALT, ALKPHOS, BILITOT, PROT, ALBUMIN in the last 168 hours. No results for input(s): LIPASE, AMYLASE in the last 168 hours. No results for input(s): AMMONIA in the last 168 hours. Coagulation Profile: Recent Labs  Lab 01/02/21 1623  INR 1.1   Cardiac Enzymes: No results for input(s): CKTOTAL, CKMB, CKMBINDEX, TROPONINI in the last 168 hours. BNP (last 3 results) No results for input(s): PROBNP in the last 8760 hours. HbA1C: No results for input(s): HGBA1C in the last 72 hours. CBG: Recent Labs  Lab 01/02/21 1742  GLUCAP 141*   Lipid Profile: No results for input(s): CHOL, HDL, LDLCALC, TRIG, CHOLHDL, LDLDIRECT in the last 72 hours. Thyroid Function Tests: No results for input(s): TSH, T4TOTAL, FREET4, T3FREE, THYROIDAB in the last 72 hours. Anemia Panel: No results for input(s): VITAMINB12, FOLATE, FERRITIN, TIBC, IRON, RETICCTPCT in the last 72 hours. Urine analysis:    Component Value Date/Time   COLORURINE YELLOW (A) 04/26/2019 1650   APPEARANCEUR CLEAR (A) 04/26/2019 1650   LABSPEC 1.013 04/26/2019  1650   PHURINE 7.0 04/26/2019 1650   GLUCOSEU >=500 (A) 04/26/2019 1650   HGBUR MODERATE (A) 04/26/2019 1650  BILIRUBINUR NEGATIVE 04/26/2019 1650   KETONESUR 5 (A) 04/26/2019 1650   PROTEINUR NEGATIVE 04/26/2019 1650   NITRITE NEGATIVE 04/26/2019 1650   LEUKOCYTESUR SMALL (A) 04/26/2019 1650   Sepsis Labs: _0 (procalcitonin:4,lacticidven:4) )No results found for this or any previous visit (from the past 240 hour(s)).   Radiological Exams on Admission: No results found.   EKG:  Not done in ED, will get one.   Assessment/Plan Principal Problem:   Wound infection_right ankle Active Problems:   GERD (gastroesophageal reflux disease)   Hyperlipidemia   Hyperkalemia   DM type 1 with diabetic peripheral neuropathy (HCC)   Diabetic infection of right foot (HCC)   Cellulitis of right lower extremity   Diabetic ulcer of right foot (HCC)   Asthma   Depression with anxiety   CKD (chronic kidney disease), stage IIIa   Normocytic anemia   Wound infection_right ankle, cellulitis of right lower extremity, diabetic ulcer of right foot heel: Patient has leukocytosis, but no fever.  Clinically does not meet criteria for sepsis.  Currently hemodynamically stable. EDP consulted Dr. Sherryle Lis of podiatry, who recommended to get an MRI of right foot.  - Admitted to MedSurg bed as inpatient - Empiric antimicrobial treatment with vancomycin and unasyn - PRN Zofran for nausea, fentanyl and Percocet for pain - Blood cultures x 2  - ESR and CRP - wound care consult - MRI-right foot and right ankle - IVF: 1.0 L of NS bolus  GERD (gastroesophageal reflux disease) -Protonix and pepcid  Hyperlipidemia -Lipitor  Hyperkalemia: Potassium 5.2 -Leukoma 5 g  DM type 1 with diabetic peripheral neuropathy [facility]): Recent recent A1c 10.2, poorly controlled.  Patient is taking Trulicity, Humalog and Levemir -Sliding scale insulin -Decrease Levemir dose from 40-60 to 30 unit bid  Asthma:  Stable -Bronchodilators  Depression with anxiety -Continue home medications  CKD (chronic kidney disease), stage IIIa: Recent baseline creatinine 1.1-1.02.  Her creatinine is 1.41, BUN 25, slightly worsening than baseline -IV fluid: 1 L normal saline -Avoid using renal toxic medications  Normocytic anemia: Hemoglobin 9.6 (8.0 on 12/09/2020) -Continue iron supplement   DVT ppx: SQ Heparin    Code Status: Full code Family Communication: not done, no family member is at bed side.  Disposition Plan:  Anticipate discharge back to previous environment Consults called:  Dr. Sherryle Lis of podiatry Admission status and Level of care: Med-Surg:  as inpt    Status is: Inpatient  Remains inpatient appropriate because:Inpatient level of care appropriate due to severity of illness   Dispo: The patient is from: Home              Anticipated d/c is to: Home              Patient currently is not medically stable to d/c.   Difficult to place patient No          Date of Service 01/02/2021    [facility]   If 7PM-7AM, please contact night-coverage [email or web address] 01/02/2021, 5:47 PM

## 2021-01-02 NOTE — ED Triage Notes (Signed)
Pt to ED via POV, pt states that she was admitted recently for infection in the right foot. Pt states that everything was good until the nurse came out on Friday to change her wound vac and last night it starting hurting so she took it off this morning, pt states that when she took the wound vac off the wound had an odor to it and now she is not able to walk on foot.  Pt noted to have redness extending up her leg, small amount of drainage on bandage. Pt is in NAD.

## 2021-01-02 NOTE — ED Provider Notes (Signed)
[facility] Emergency Department Provider Note   ____________________________________________    I have reviewed the triage vital signs and the nursing notes.   HISTORY  Chief Complaint Foot Pain     HPI Judie PetitLorie Ann Tatsch is a 55 y.o. female with history of diabetes, nonhealing ulcerations of the right foot who presents with complaints of increased pain drainage, foul odor and redness extending up her right leg.  Had procedure in mid March, has been wearing a wound VAC, had a taken off by her podiatrist for several days, reapplied 2 days ago, pain developed yesterday with redness developing overnight extending up her leg.  Denies fevers or chills.  Describes burning pain in her right leg which is moderate to severe.   Past Medical History:  Diagnosis Date  . Allergy   . Anxiety   . Arthritis   . Asthma   . Depression   . Diabetes mellitus without complication (HCC)   . GERD (gastroesophageal reflux disease)   . History of chicken pox 04/07/2015   DID have Chicken Pox.    . Hyperlipidemia   . Hypertension   . Neuromuscular disorder (HCC)   . Venous (peripheral) insufficiency     Patient Active Problem List   Diagnosis Date Noted  . Aortic atherosclerosis (HCC) 12/13/2020  . Thrombocytosis   . Anemia of chronic disease   . Leukocytosis   . Hyperkalemia   . Acute kidney injury superimposed on CKD (HCC)   . Obesity, Class III, BMI 40-49.9 (morbid obesity) (HCC)   . Cellulitis and abscess of foot, except toes   . Right leg pain   . Ulcer of right heel and midfoot with fat layer exposed (HCC)   . Abscess of right leg   . Uncontrolled type 2 diabetes mellitus with hyperglycemia (HCC)   . Cellulitis 11/29/2020  . Morbid obesity (HCC) 08/18/2020  . DM type 1 with diabetic peripheral neuropathy (HCC) 06/09/2020  . Sepsis (HCC) 04/26/2019  . Asthma, intermittent 02/04/2018  . Generalized anxiety disorder 02/04/2018  . Type 1 diabetes mellitus  with hyperglycemia (HCC) 02/04/2018  . Arthritis 05/10/2016  . Chronic kidney disease 04/05/2016  . Allergic rhinitis 04/07/2015  . Depression 04/07/2015  . Type 1 diabetes mellitus with hypoglycemia without coma (HCC) 04/07/2015  . Diabetic retinopathy (HCC) 04/07/2015  . Dysesthesia 04/07/2015  . Family history of early CAD 04/07/2015  . GERD (gastroesophageal reflux disease) 04/07/2015  . Hyperlipidemia 04/07/2015  . Insomnia 04/07/2015  . Tobacco abuse 04/07/2015  . Venous insufficiency 04/07/2015  . Anxiety 04/07/2015    Past Surgical History:  Procedure Laterality Date  . APPENDECTOMY  1979  . BILATERAL CARPAL TUNNEL RELEASE Bilateral    Dr. Rosita KeaMenz  . CESAREAN SECTION  2003  . INCISION AND DRAINAGE Right 12/01/2020   Procedure: INCISION AND DRAINAGE;  Surgeon: Edwin CapMcDonald, Adam R, DPM;  Location: [facility] ORS;  Service: Podiatry;  Laterality: Right;  . INCISION AND DRAINAGE Right 12/07/2020   Procedure: INCISION AND DRAINAGE;  Surgeon: Felecia ShellingEvans, Brent M, DPM;  Location: [facility] ORS;  Service: Podiatry;  Laterality: Right;  . LAPAROSCOPIC LYSIS OF ADHESIONS    . SHOULDER SURGERY Right 08/2009   Dr. Rosita KeaMenz  . TUBAL LIGATION      Prior to Admission medications   Medication Sig Start Date End Date Taking? Authorizing Provider  amitriptyline (ELAVIL) 25 MG tablet TAKE 1 TABLET BY MOUTH NIGHTLY AS NEEDED FOR NEUROPATHY PAIN 11/03/20   [provider]  aspirin 81 MG  tablet Take 81 mg by mouth daily.    [provider]  atorvastatin (LIPITOR) 40 MG tablet Take 40 mg by mouth daily. 07/07/20 07/07/21  [provider]  ATROVENT HFA 17 MCG/ACT inhaler INHALE 2 PUFFS BY MOUTH FOUR TIMES DAILY FOR COPD 07/30/20   [provider]  cetirizine (ZYRTEC) 10 MG tablet TAKE 1 TABLET(10 MG) BY MOUTH DAILY Patient taking differently: Take 10 mg by mouth daily. 05/16/17   Malva Limes, MD  famotidine (PEPCID) 40 MG tablet Take 40 mg by mouth daily. 10/19/20   [provider]  ferrous sulfate 325 (65 FE) MG EC tablet Take 1 tablet (325 mg total) by mouth 2 (two) times daily. 05/06/19 08/26/20  Houston Siren, MD  FLUoxetine (PROZAC) 20 MG capsule Take 1 capsule (20 mg total) by mouth daily. 12/10/20   Alford Highland, MD  fluticasone (FLONASE) 50 MCG/ACT nasal spray SHAKE WELL AND USE 2 SPRAYS IN EACH NOSTRIL DAILY 04/02/17   Malva Limes, MD  HUMALOG KWIKPEN 100 UNIT/ML KwikPen Inject 5 Units into the skin 3 (three) times daily with meals. INJ UP TO 100 UNI [place] PER DAY AS PER MD INSTRUCTIONS 12/09/20   Alford Highland, MD  hydrOXYzine (ATARAX/VISTARIL) 10 MG tablet Take 10 mg by mouth 3 (three) times daily as needed.    Oswaldo Conroy, MD  injection device for insulin (INPEN 100-BLUE-LILLY) DEVI Use daily as needed with Humalog cartridges. BLUE 05/05/20   [provider]  LEVEMIR FLEXTOUCH 100 UNIT/ML FlexPen Inject 40 Units into the skin daily. 12/09/20   Alford Highland, MD  Melatonin 10 MG TABS Take by mouth.    [provider]  montelukast (SINGULAIR) 10 MG tablet TAKE 1 TABLET BY MOUTH AT BEDTIME Patient taking differently: Take 10 mg by mouth at bedtime. 05/16/17   Malva Limes, MD  mupirocin ointment (BACTROBAN) 2 % Apply 1 application topically 2 (two) times daily. 12/13/20   McDonald, Rachelle Hora, DPM  omeprazole (PRILOSEC) 40 MG capsule Take 40 mg by mouth 2 (two) times daily. 11/21/20   [provider]  pregabalin (LYRICA) 150 MG capsule Take 150 mg by mouth daily. 05/10/20 05/10/21  [provider]  PROAIR HFA 108 (90 Base) MCG/ACT inhaler INHALE 2 PUFFS INTO THE LUNGS EVERY 6 HOURS AS NEEDED FOR WHEEZING OR SHORTNESS OF BREATH 02/12/17   Malva Limes, MD  sulfamethoxazole-trimethoprim (BACTRIM DS) 800-160 MG tablet Take 1 tablet by mouth 2 (two) times daily. 12/29/20   McDonald, Rachelle Hora, DPM  TRULICITY 3 MG/0.5ML SOPN INJECT 0.5 ML UNDER THE SKIN EVERY 7 DAYS 11/18/20   [provider]      Allergies Atorvastatin, Levaquin  [levofloxacin in d5w], Simvastatin, Dilaudid  [hydromorphone hcl], and Tetracycline  Family History  Problem Relation Age of Onset  . Hypertension Mother   . Diabetes Mother   . Hyperlipidemia Mother   . Arthritis Mother   . Heart disease Father   . Kidney disease Father     Social History Social History   Tobacco Use  . Smoking status: Former Smoker    Packs/day: 1.00    Years: 30.00    Pack years: 30.00    Types: Cigarettes    Start date: 04/06/1985    Quit date: 09/25/2017    Years since quitting: 3.2  . Smokeless tobacco: Never Used  Vaping Use  . Vaping Use: Never used  Substance Use Topics  . Alcohol use: Yes    Alcohol/week: 1.0 -  2.0 standard drink    Types: 1 - 2 Glasses of wine per week    Comment: Socially  . Drug use: No    Review of Systems  Constitutional: No fever/chills Eyes: No visual changes.  ENT: No sore throat. Cardiovascular: Denies chest pain. Respiratory: Denies shortness of breath. Gastrointestinal: No abdominal pain.  No nausea, no vomiting.   Genitourinary: Negative for dysuria. Musculoskeletal: As above Skin: As above Neurological: Negative for headaches or weakness   ____________________________________________   PHYSICAL EXAM:  VITAL SIGNS: ED Triage Vitals  Enc Vitals Group     BP 01/02/21 1454 100/61     Pulse Rate 01/02/21 1454 89     Resp 01/02/21 1454 16     Temp 01/02/21 1454 98.3 F (36.8 C)     Temp Source 01/02/21 1454 Oral     SpO2 01/02/21 1454 97 %     Weight 01/02/21 1456 104.3 kg (230 lb)     Height 01/02/21 1456 1.575 m (5\' 2" )     Head Circumference --      Peak Flow --      Pain Score 01/02/21 1456 8     Pain Loc --      Pain Edu? --      Excl. in GC? --     Constitutional: Alert and oriented.   Nose: No congestion/rhinnorhea. Mouth/Throat: Mucous membranes are moist.   Neck:  Painless ROM Cardiovascular: Normal rate, regular rhythm. Grossly normal heart  sounds.  Good peripheral circulation. Respiratory: Normal respiratory effort.  No retractions. Lungs CTAB. Gastrointestinal: Soft and nontender. No distention.  No CVA tenderness.  Musculoskeletal: Right leg: Medial, distal large ulceration as well as lateral ulceration both are demonstrating yellowish drainage, with erythema extending up the anterior aspect of the lower leg, heel ulcer appears at baseline Neurologic:  Normal speech and language. No gross focal neurologic deficits are appreciated.  Skin:  Skin is warm, dry Psychiatric: Mood and affect are normal. Speech and behavior are normal.  ____________________________________________   LABS (all labs ordered are listed, but only abnormal results are displayed)  Labs Reviewed  CBC WITH DIFFERENTIAL/PLATELET - Abnormal; Notable for the following components:      Result Value   WBC 19.7 (*)    RBC 3.55 (*)    Hemoglobin 9.6 (*)    HCT 31.0 (*)    RDW 16.7 (*)    Neutro Abs 15.3 (*)    Monocytes Absolute 1.2 (*)    Eosinophils Absolute 0.7 (*)    Abs Immature Granulocytes 0.15 (*)    All other components within normal limits  BASIC METABOLIC PANEL - Abnormal; Notable for the following components:   Sodium 134 (*)    Potassium 5.2 (*)    Glucose, Bld 133 (*)    BUN 25 (*)    Creatinine, Ser 1.41 (*)    Calcium 8.3 (*)    GFR, Estimated 44 (*)    All other components within normal limits  CULTURE, BLOOD (ROUTINE X 2)  CULTURE, BLOOD (ROUTINE X 2)  SARS CORONAVIRUS 2 (TAT 6-24 HRS)  LACTIC ACID, PLASMA  LACTIC ACID, PLASMA   ____________________________________________  EKG  None ____________________________________________  RADIOLOGY  None ____________________________________________   PROCEDURES  Procedure(s) performed: No  Procedures   Critical Care performed: No ____________________________________________   INITIAL IMPRESSION / ASSESSMENT AND PLAN / ED COURSE  Pertinent labs & imaging results  that were available during my care of the patient were reviewed by me and  considered in my medical decision making (see chart for details).  Patient presents with exam is consistent with wound infection with cellulitis extending up the leg.  Her white blood cell count is 19.7, significantly elevated from what appears to be her baseline of about 13.  We will add on lactic acid, blood cultures, start IV antibiotic, give IV morphine and IV  Discussed with Dr. Lilian Kapur of podiatry who agrees with admission, agrees with Unasyn IV      ____________________________________________   FINAL CLINICAL IMPRESSION(S) / ED DIAGNOSES  Final diagnoses:  Wound infection  Cellulitis of right lower extremity        Note:  This document was prepared using Dragon voice recognition software and may include unintentional dictation errors.   Jene Every, MD 01/02/21 [phone]

## 2021-01-02 NOTE — Plan of Care (Signed)

## 2021-01-02 NOTE — Progress Notes (Signed)
Pharmacy Antibiotic Note  Emily Collins is a 55 y.o. female admitted on 01/02/2021 with cellulitis and diabetic foot infection.  Pharmacy has been consulted for Unasyn and Vancomycin dosing.  Plan: Vancomycin 2000mg  IV bolus followed by  Vancomycin 1250 mg IV Q 36 hrs. Goal AUC 400-550. Expected AUC: 470.1 SCr used: 1.41 Expected Cmin: 10.5  Unasyn 3g IV q6h  Height: 5\' 2"  (157.5 cm) Weight: 104.3 kg (230 lb) IBW/kg (Calculated) : 50.1  Temp (24hrs), Avg:98.3 F (36.8 C), Min:98.3 F (36.8 C), Max:98.3 F (36.8 C)  Recent Labs  Lab 01/02/21 1458 01/02/21 1605  WBC 19.7*  --   CREATININE 1.41*  --   LATICACIDVEN  --  1.3    Estimated Creatinine Clearance: 51.1 mL/min (A) (by C-G formula based on SCr of 1.41 mg/dL (H)).    Allergies  Allergen Reactions  . Atorvastatin     Muscle and join pains.  03/04/21  [Levofloxacin In D5w]     GI upset  . Simvastatin     joint aches.  . Dilaudid  [Hydromorphone Hcl] Hives, Itching and Rash  . Tetracycline Rash    Antimicrobials this admission: Unasyn 4/10 >>  Vancomycin 4/10 >>   Dose adjustments this admission:  Microbiology results:  Thank you for allowing pharmacy to be a part of this patient's care.  6/10, PharmD, BCPS 01/02/2021 5:15 PM

## 2021-01-03 LAB — CBC
HCT: 27.8 % — ABNORMAL LOW (ref 36.0–46.0)
Hemoglobin: 8.7 g/dL — ABNORMAL LOW (ref 12.0–15.0)
MCH: 27.4 pg (ref 26.0–34.0)
MCHC: 31.3 g/dL (ref 30.0–36.0)
MCV: 87.7 fL (ref 80.0–100.0)
Platelets: 345 10*3/uL (ref 150–400)
RBC: 3.17 MIL/uL — ABNORMAL LOW (ref 3.87–5.11)
RDW: 16.9 % — ABNORMAL HIGH (ref 11.5–15.5)
WBC: 14.9 10*3/uL — ABNORMAL HIGH (ref 4.0–10.5)
nRBC: 0 % (ref 0.0–0.2)

## 2021-01-03 LAB — SARS CORONAVIRUS 2 (TAT 6-24 HRS): SARS Coronavirus 2: NEGATIVE

## 2021-01-03 LAB — GLUCOSE, CAPILLARY
Glucose-Capillary: 270 mg/dL — ABNORMAL HIGH (ref 70–99)
Glucose-Capillary: 128 mg/dL — ABNORMAL HIGH (ref 70–99)
Glucose-Capillary: 103 mg/dL — ABNORMAL HIGH (ref 70–99)
Glucose-Capillary: 95 mg/dL (ref 70–99)

## 2021-01-03 LAB — BASIC METABOLIC PANEL
Anion gap: 7 (ref 5–15)
BUN: 27 mg/dL — ABNORMAL HIGH (ref 6–20)
CO2: 21 mmol/L — ABNORMAL LOW (ref 22–32)
Calcium: 7.7 mg/dL — ABNORMAL LOW (ref 8.9–10.3)
Chloride: 104 mmol/L (ref 98–111)
Creatinine, Ser: 1.47 mg/dL — ABNORMAL HIGH (ref 0.44–1.00)
GFR, Estimated: 42 mL/min — ABNORMAL LOW (ref >60)
Glucose, Bld: 303 mg/dL — ABNORMAL HIGH (ref 70–99)
Potassium: 5.1 mmol/L (ref 3.5–5.1)
Sodium: 132 mmol/L — ABNORMAL LOW (ref 135–145)

## 2021-01-03 MED ORDER — LACTATED RINGERS IV SOLN: INTRAVENOUS | Status: DC

## 2021-01-03 MED ORDER — MORPHINE SULFATE (PF) 2 MG/ML IV SOLN
2.0000 mg | INTRAVENOUS | Status: DC | PRN
  Administered 2021-01-03 – 2021-01-07 (×14): 2 mg via INTRAVENOUS
  Filled 2021-01-03 (×14): qty 1

## 2021-01-03 MED ORDER — OXYCODONE HCL 5 MG PO TABS
5.0000 mg | ORAL_TABLET | ORAL | Status: DC | PRN
  Administered 2021-01-03 – 2021-01-07 (×11): 5 mg via ORAL
  Filled 2021-01-03 (×12): qty 1

## 2021-01-03 NOTE — Consult Note (Signed)
Reason for Consult: cellulitis, open ulcers right ankle Referring Physician: Dr Donella Stade Emily Collins is an 55 y.o. female.  HPI: She is well known to me from the outpatient setting, I saw her on 12/29/20. She developed redness into the foot over the weekend, presented to the [facility] ED on 01/02/21 and was admitted. Denies f/v/c/ns. On unasyn. MRI completed last night. Feels better today than yesterday, swelling and pain improving  Past Medical History:  Diagnosis Date  . Allergy   . Anxiety   . Arthritis   . Asthma   . Depression   . Diabetes mellitus without complication (HCC)   . GERD (gastroesophageal reflux disease)   . History of chicken pox 04/07/2015   DID have Chicken Pox.    . Hyperlipidemia   . Hypertension   . Neuromuscular disorder (HCC)   . Venous (peripheral) insufficiency     Past Surgical History:  Procedure Laterality Date  . APPENDECTOMY  1979  . BILATERAL CARPAL TUNNEL RELEASE Bilateral    Dr. Rosita Kea  . CESAREAN SECTION  2003  . INCISION AND DRAINAGE Right 12/01/2020   Procedure: INCISION AND DRAINAGE;  Surgeon: Edwin Cap, DPM;  Location: [facility] ORS;  Service: Podiatry;  Laterality: Right;  . INCISION AND DRAINAGE Right 12/07/2020   Procedure: INCISION AND DRAINAGE;  Surgeon: [name], DPM;  Location: [facility] ORS;  Service: Podiatry;  Laterality: Right;  . LAPAROSCOPIC LYSIS OF ADHESIONS    . SHOULDER SURGERY Right 08/2009   Dr. Rosita Kea  . TUBAL LIGATION      Family History  Problem Relation Age of Onset  . Hypertension Mother   . Diabetes Mother   . Hyperlipidemia Mother   . Arthritis Mother   . Heart disease Father   . Kidney disease Father     Social History:  reports that she quit smoking about 3 years ago. Her smoking use included cigarettes. She started smoking about 35 years ago. She has a 30.00 pack-year smoking history. She has never used smokeless tobacco. She reports current alcohol use of about 1.0 - 2.0 standard drink of alcohol  per week. She reports that she does not use drugs.  Allergies:  Allergies  Allergen Reactions  . Atorvastatin     Muscle and join pains.  Barbera Setters  [Levofloxacin In D5w]     GI upset  . Simvastatin     joint aches.  . Dilaudid  [Hydromorphone Hcl] Hives, Itching and Rash  . Tetracycline Rash  I have reviewed the patient's current medications.  Medications:   Results for orders placed or performed during the hospital encounter of 01/02/21 (from the past 48 hour(s))  CBC with Differential     Status: Abnormal   Collection Time: 01/02/21  2:58 PM  Result Value Ref Range   WBC 19.7 (H) 4.0 - 10.5 K/uL   RBC 3.55 (L) 3.87 - 5.11 MIL/uL   Hemoglobin 9.6 (L) 12.0 - 15.0 g/dL   HCT 36.6 (L) 81.5 - 94.7 %   MCV 87.3 80.0 - 100.0 fL   MCH 27.0 26.0 - 34.0 pg   MCHC 31.0 30.0 - 36.0 g/dL   RDW 07.6 (H) 15.1 - 83.4 %   Platelets 388 150 - 400 K/uL   nRBC 0.0 0.0 - 0.2 %   Neutrophils Relative % 77 %   Neutro Abs 15.3 (H) 1.7 - 7.7 K/uL   Lymphocytes Relative 12 %   Lymphs Abs 2.3 0.7 - 4.0 K/uL  Monocytes Relative 6 %   Monocytes Absolute 1.2 (H) 0.1 - 1.0 K/uL   Eosinophils Relative 3 %   Eosinophils Absolute 0.7 (H) 0.0 - 0.5 K/uL   Basophils Relative 1 %   Basophils Absolute 0.1 0.0 - 0.1 K/uL   Immature Granulocytes 1 %   Abs Immature Granulocytes 0.15 (H) 0.00 - 0.07 K/uL    Comment: Performed at [facility], [address], [place], [place] [address]  Basic metabolic panel     Status: Abnormal   Collection Time: 01/02/21  2:58 PM  Result Value Ref Range   Sodium 134 (L) 135 - 145 mmol/L   Potassium 5.2 (H) 3.5 - 5.1 mmol/L   Chloride 105 98 - 111 mmol/L   CO2 23 22 - 32 mmol/L   Glucose, Bld 133 (H) 70 - 99 mg/dL    Comment: Glucose reference range applies only to samples taken after fasting for at least 8 hours.   BUN 25 (H) 6 - 20 mg/dL   Creatinine, Ser 8.29 (H) 0.44 - 1.00 mg/dL   Calcium 8.3 (L) 8.9 - 10.3 mg/dL   GFR, Estimated 44 (L) >60 mL/min     Comment: (NOTE) Calculated using the CKD-EPI Creatinine Equation (2021)    Anion gap 6 5 - 15    Comment: Performed at [facility], [address] [place], [place] [address]  Lactic acid, plasma     Status: None   Collection Time: 01/02/21  4:05 PM  Result Value Ref Range   Lactic Acid, Venous 1.3 0.5 - 1.9 mmol/L    Comment: Performed at [facility], [address] [place], [place] [address]  Blood culture (routine x 2)     Status: None (Preliminary result)   Collection Time: 01/02/21  4:05 PM   Specimen: BLOOD  Result Value Ref Range   Specimen Description BLOOD BLOOD RIGHT ARM    Special Requests      BOTTLES DRAWN AEROBIC AND ANAEROBIC Blood Culture results may not be optimal due to an inadequate volume of blood received in culture bottles   Culture      NO GROWTH < 12 HOURS Performed at [facility], [address] [place], [place] [address]    Report Status PENDING   Blood culture (routine x 2)     Status: None (Preliminary result)   Collection Time: 01/02/21  4:05 PM   Specimen: BLOOD  Result Value Ref Range   Specimen Description BLOOD BLOOD LEFT FOREARM    Special Requests      BOTTLES DRAWN AEROBIC AND ANAEROBIC Blood Culture adequate volume   Culture      NO GROWTH < 12 HOURS Performed at [facility], [address] [place], [place] [address]    Report Status PENDING   SARS CORONAVIRUS 2 (TAT 6-24 HRS) Nasopharyngeal Nasopharyngeal Swab     Status: None   Collection Time: 01/02/21  4:05 PM   Specimen: Nasopharyngeal Swab  Result Value Ref Range   SARS Coronavirus 2 NEGATIVE NEGATIVE    Comment: (NOTE) SARS-CoV-2 target nucleic acids are NOT DETECTED.  The SARS-CoV-2 RNA is generally detectable in upper and lower respiratory specimens during the acute phase of infection. Negative results do not preclude SARS-CoV-2 infection, do not rule out co-infections with other pathogens, and should not be used as the sole  basis for treatment or other patient management decisions. Negative results must be combined with clinical observations, patient history, and epidemiological information. The expected  result is Negative.  Fact Sheet for Patients: HairSlick.nohttps://www.fda.gov/media/138098/download  Fact Sheet for Healthcare Providers: quierodirigir.comhttps://www.fda.gov/media/138095/download  This test is not yet approved or cleared by the [place] FDA and  has been authorized for detection and/or diagnosis of SARS-CoV-2 by FDA under an Emergency Use Authorization (EUA). This EUA will remain  in effect (meaning this test can be used) for the duration of the COVID-19 declaration under Se ction 564(b)(1) of the Act, 21 U.S.C. section 360bbb-3(b)(1), unless the authorization is terminated or revoked sooner.  Performed at [facility], [address] [place], [place] [address]   C-reactive protein     Status: Abnormal   Collection Time: 01/02/21  4:23 PM  Result Value Ref Range   CRP 13.4 (H) <1.0 mg/dL    Comment: Performed at [facility], [address] [place], [place] [address]  Sedimentation rate     Status: Abnormal   Collection Time: 01/02/21  4:23 PM  Result Value Ref Range   Sed Rate 106 (H) 0 - 30 mm/hr    Comment: Performed at [facility], [address] [place], [place] [address]  Protime-INR     Status: None   Collection Time: 01/02/21  4:23 PM  Result Value Ref Range   Prothrombin Time 13.4 11.4 - 15.2 seconds   INR 1.1 0.8 - 1.2    Comment: (NOTE) INR goal varies based on device and disease states. Performed at [facility], [address] [place], [place] [address]   APTT     Status: None   Collection Time: 01/02/21  4:23 PM  Result Value Ref Range   aPTT 33 24 - 36 seconds    Comment: Performed at [facility], [address] [place], [place] [address]  Lactic acid, plasma     Status: None   Collection Time: 01/02/21  5:26 PM  Result Value Ref  Range   Lactic Acid, Venous 1.0 0.5 - 1.9 mmol/L    Comment: Performed at [facility], [address] [place], [place] [address]  Glucose, capillary     Status: Abnormal   Collection Time: 01/02/21  5:42 PM  Result Value Ref Range   Glucose-Capillary 141 (H) 70 - 99 mg/dL    Comment: Glucose reference range applies only to samples taken after fasting for at least 8 hours.  Glucose, capillary     Status: Abnormal   Collection Time: 01/02/21  9:43 PM  Result Value Ref Range   Glucose-Capillary 312 (H) 70 - 99 mg/dL    Comment: Glucose reference range applies only to samples taken after fasting for at least 8 hours.  Basic metabolic panel     Status: Abnormal   Collection Time: 01/03/21  5:10 AM  Result Value Ref Range   Sodium 132 (L) 135 - 145 mmol/L   Potassium 5.1 3.5 - 5.1 mmol/L   Chloride 104 98 - 111 mmol/L   CO2 21 (L) 22 - 32 mmol/L   Glucose, Bld 303 (H) 70 - 99 mg/dL    Comment: Glucose reference range applies only to samples taken after fasting for at least 8 hours.   BUN 27 (H) 6 - 20 mg/dL   Creatinine, Ser 5.781.47 (H) 0.44 - 1.00 mg/dL   Calcium 7.7 (L) 8.9 - 10.3 mg/dL   GFR, Estimated 42 (L) >60 mL/min    Comment: (NOTE) Calculated using the CKD-EPI Creatinine Equation (2021)    Anion gap 7 5 - 15  Comment: Performed at [facility], [address] [place], [place] [address]  CBC     Status: Abnormal   Collection Time: 01/03/21  5:10 AM  Result Value Ref Range   WBC 14.9 (H) 4.0 - 10.5 K/uL   RBC 3.17 (L) 3.87 - 5.11 MIL/uL   Hemoglobin 8.7 (L) 12.0 - 15.0 g/dL   HCT 97.6 (L) 73.4 - 19.3 %   MCV 87.7 80.0 - 100.0 fL   MCH 27.4 26.0 - 34.0 pg   MCHC 31.3 30.0 - 36.0 g/dL   RDW 79.0 (H) 24.0 - 97.3 %   Platelets 345 150 - 400 K/uL   nRBC 0.0 0.0 - 0.2 %    Comment: Performed at [facility], [address] [place], [place] [address]  Glucose, capillary     Status: Abnormal   Collection Time: 01/03/21  9:01 AM  Result Value  Ref Range   Glucose-Capillary 270 (H) 70 - 99 mg/dL    Comment: Glucose reference range applies only to samples taken after fasting for at least 8 hours.  Glucose, capillary     Status: Abnormal   Collection Time: 01/03/21 12:08 PM  Result Value Ref Range   Glucose-Capillary 128 (H) 70 - 99 mg/dL    Comment: Glucose reference range applies only to samples taken after fasting for at least 8 hours.  Glucose, capillary     Status: Abnormal   Collection Time: 01/03/21  4:41 PM  Result Value Ref Range   Glucose-Capillary 103 (H) 70 - 99 mg/dL    Comment: Glucose reference range applies only to samples taken after fasting for at least 8 hours.    MR ANKLE RIGHT W WO CONTRAST  Result Date: 01/03/2021 CLINICAL DATA:  Diabetic foot and ankle wounds. History of incision and drainage on 12/01/2020 EXAM: MRI OF THE RIGHT ANKLE WITHOUT AND WITH CONTRAST TECHNIQUE: Multiplanar, multisequence MR imaging of the ankle was performed before and after the administration of intravenous contrast. CONTRAST:  78mL GADAVIST GADOBUTROL 1 MMOL/ML IV SOLN COMPARISON:  X-ray 11/29/2020, MRI 11/29/2020 FINDINGS: Technical note: Motion degraded exam. TENDONS Peroneal: Short segment split tear of the infra malleolar peroneus brevis tendon. Intact peroneus longus. No tenosynovitis. Posteromedial: Intact tibialis posterior, flexor hallucis longus and flexor digitorum longus tendons. Anterior: Tibialis anterior tendon closely approximates the base of surgical incision site at the anteromedial aspect of the ankle. Anterior ankle tendons are intact. No significant tenosynovial fluid collection. Achilles: Intact. Plantar Fascia: Intact. LIGAMENTS Lateral: Grossly intact. Medial: Grossly intact. CARTILAGE Ankle Joint: No focal cartilage defect. No tibiotalar joint effusion. Subtalar Joints/Sinus Tarsi: No joint effusion or chondral defect. Preservation of the anatomic fat within the sinus tarsi. Bones: No acute fracture or malalignment.  No bony erosion or cortical destruction. No bone marrow edema. Preservation of the fatty T1 bone marrow signal throughout the ankle and visualized foot. Other: Surgical incision sites at the anteromedial and anterolateral aspects of the ankle with transversely oriented peripherally enhancing tract extending between the incision sites (series 10, image 18), which is likely postsurgical. No residual fluid collection at the anterior aspect of the ankle. No new fluid collections. Small superficial ulceration overlies the posteromedial aspect of the calcaneus without organized fluid collection or abscess. IMPRESSION: IMPRESSION 1. Interval postsurgical changes from incision and drainage at the anterior aspect of the ankle. No residual fluid collection or abscess. 2. Small superficial ulceration overlies the posteromedial aspect of the calcaneus without organized fluid collection or abscess. 3. Negative for acute  osteomyelitis of the right ankle. Electronically Signed   By: Duanne Guess D.O.   On: 01/03/2021 08:17    Review of Systems  Constitutional: Negative for chills, fever and weight loss.  All other systems reviewed and are negative.  Blood pressure (!) 115/55, pulse 86, temperature 98.9 F (37.2 C), temperature source Oral, resp. rate 20, height 5\' 2"  (1.575 m), weight 104.3 kg, SpO2 97 %.  Vitals:   01/03/21 1529 01/03/21 2003  BP: 118/62 (!) 115/55  Pulse: 80 86  Resp: 18 20  Temp: 98.3 F (36.8 C) 98.9 F (37.2 C)  SpO2: 95% 97%    General AA&O x3. Normal mood and affect.  Vascular Dorsalis pedis and posterior tibial pulses  present 2+ right  Capillary refill normal to all digits. Pedal hair growth normal.  Neurologic Epicritic sensation grossly absent.  Dermatologic (Wound) Wound Location: anterior ankle x2, medial heel Wound Measurement: approximately 7cm x 3cm x 1cm with exposed TA tendon, drainage; 4.5cm x 7.5cm x 1cm medial heel measures 2.5cm Wound Base:  fibrogranular Peri-wound: Reddened Exudate: Moderate amount Serosanguinous exudate  Orthopedic: Motor intact BLE.    Assessment/Plan:  R ankle ulcerations -Imaging: Studies independently reviewed, MRI negative for OM or abscess -Antibiotics: continue unasyn -WB Status: WB in CAM boot -Wound Care: saline wet to dry daily  -Surgical Plan: discussed with her she likely would benefit from debridement, which prior to admission was likely going to be necessary as an outpatient. I recommend continuing IV abx for now, we will go to OR Wed 01/05/21 evening for debridement, application of a skin substitute graft to cover wound bed and TA tendon, and VAC. 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

## 2021-01-03 NOTE — Consult Note (Signed)
WOC Nurse Consult Note: Reason for Consult: Complex nonhealing right foot wounds. Patient under the care of podiatry (last seen on 12/29/2020) and ID (last seen on 12/21/2020). Podiatry (Dr. Lilian Kapur) is simultaneously consulted with WOC Nursing. Dr. Lilian Kapur has been in communication with the ED Provider and will see in house.   WOC Nursing will defer to the Podiatric POC. Guidance for Nursing is provided for the topical care of the patient's wounds using saline dampened gauze dressings topped with dry dressings and secured with Kerlix roll gauze.  WOC nursing team will not follow, but will remain available to this patient, the nursing and medical teams.  Please re-consult if needed. Thanks, Ladona Mow, MSN, RN, GNP, [place]  Pager# [phone]

## 2021-01-03 NOTE — Progress Notes (Signed)
PROGRESS NOTE    Emily Collins  ZOX:[id number] DOB: 06/10/1966 DOA: 01/02/2021 PCP: Oswaldo ConroyBender, Abby Daneele, MD  Brief Narrative:   55 y.o. female with medical history significant of DM-tI, HTN. HLD, asthma, GERD, depression with anxiety, former smoker, CKD-IIIa, anemia, diabetic foot ulcer with infection, presents with right foot ulcer and foot pain.   Patient was recently hospitalized from 3/7-3/17 due to right foot ulcer, right ankle abscess and cellulitis.  Patient is s/p of I&D, currently on VAC. Patient states that her pain in right foot and ankle has been worsening in the past several days.  Patient has increased drainage from two deep wounds of right ankle, worsening erythema in right foot and ankle, extending up to her right lower leg. She also has a small ulcer in right heal.  Patient attributes pain to wound VAC being incorrectly placed at home.  Had her husband remove the wound VAC and now has wet-to-dry dressings on the area.  Still in a fair bit of pain.  Communicated with Dr. Lilian KapurMcDonald from podiatry who will see the patient.  Recommendations appreciated.   Assessment & Plan:   Principal Problem:   Wound infection_right ankle Active Problems:   GERD (gastroesophageal reflux disease)   Hyperlipidemia   Hyperkalemia   DM type 1 with diabetic peripheral neuropathy (HCC)   Diabetic infection of right foot (HCC)   Cellulitis of right lower extremity   Diabetic ulcer of right foot (HCC)   Asthma   Depression with anxiety   CKD (chronic kidney disease), stage IIIa   Normocytic anemia  Wound infection of right ankle, cellulitis of right lower extremity Diabetic foot ulcer right lower extremity Does not meet sepsis criteria Has chronic leukocytosis Hemodynamically stable MRI read right foot, no osteomyelitis or abscess Plan: Continue empiric antibiotics for now As needed pain control Follow blood cultures Monitor vitals and fever curve Follow-up podiatry  recommendations Possible discharge on 4/12  GERD (gastroesophageal reflux disease) -Protonix and pepcid  Hyperlipidemia -Lipitor  Hyperkalemia: Potassium 5.2 -Leukoma 5 g  DM type 1 with diabetic peripheral neuropathy [facility](HCC): Recent recent A1c 10.2, poorly controlled.  Patient is taking Trulicity, Humalog and Levemir -Sliding scale insulin -Decrease Levemir dose from 40-60 to 30 unit bid  Asthma: Stable -Bronchodilators  Depression with anxiety -Continue home medications  CKD (chronic kidney disease), stage IIIa: Recent baseline creatinine 1.1-1.02.  Her creatinine is 1.41, BUN 25, slightly worsening than baseline -IV fluid: 1 L normal saline -Avoid using renal toxic medications  Normocytic anemia: Hemoglobin 9.6 (8.0 on 12/09/2020) -Continue iron supplement   DVT prophylaxis: SQ heparin Code Status: Full Family Communication: None today Disposition Plan: Status is: Inpatient  Remains inpatient appropriate because:Inpatient level of care appropriate due to severity of illness   Dispo: The patient is from: Home              Anticipated d/c is to: Home              Patient currently is not medically stable to d/c.   Difficult to place patient No  Pending podiatry evaluation, recommendations appreciated.  Anticipate transition to p.o. antibiotics and discharged home on 4/12 if pain improved.     Level of care: Med-Surg  Consultants:   Podiatry  Procedures:   None  Antimicrobials:   Vancomycin  Unasyn   Subjective: Patient seen and examined.  Pain in affected extremity although improved from admission.  No other complaints  Objective: Vitals:   01/03/21 0419 01/03/21 0901 01/03/21 1100  01/03/21 1529  BP: (!) 121/52 (!) 110/59 (!) 118/59 118/62  Pulse: 78 82 80 80  Resp: 20 18 18 18   Temp: 98.4 F (36.9 C) 98.1 F (36.7 C) 98.3 F (36.8 C) 98.3 F (36.8 C)  TempSrc: Oral  Oral Oral  SpO2: 95% 96% 96% 95%  Weight:      Height:         Intake/Output Summary (Last 24 hours) at 01/03/2021 1533 Last data filed at 01/03/2021 0512 Gross per 24 hour  Intake 83.93 ml  Output 0 ml  Net 83.93 ml   Filed Weights   01/02/21 1456  Weight: 104.3 kg    Examination:  General exam: Appears calm and comfortable  Respiratory system: Clear to auscultation. Respiratory effort normal. Cardiovascular system: S1 & S2 heard, RRR. No JVD, murmurs, rubs, gallops or clicks. No pedal edema. Gastrointestinal system: Abdomen is nondistended, soft and nontender. No organomegaly or masses felt. Normal bowel sounds heard. Central nervous system: Alert and oriented. No focal neurological deficits. Extremities: Symmetric 5 x 5 power. Skin: has two deep wounds of right ankle, small ulcer in right heel.  Has pain, swelling, erythema in right foot, right ankle and lower leg. Psychiatry: Judgement and insight appear normal. Mood & affect appropriate.     Data Reviewed: I have personally reviewed following labs and imaging studies  CBC: Recent Labs  Lab 01/02/21 1458 01/03/21 0510  WBC 19.7* 14.9*  NEUTROABS 15.3*  --   HGB 9.6* 8.7*  HCT 31.0* 27.8*  MCV 87.3 87.7  PLT 388 345   Basic Metabolic Panel: Recent Labs  Lab 01/02/21 1458 01/03/21 0510  NA 134* 132*  K 5.2* 5.1  CL 105 104  CO2 23 21*  GLUCOSE 133* 303*  BUN 25* 27*  CREATININE 1.41* 1.47*  CALCIUM 8.3* 7.7*   GFR: Estimated Creatinine Clearance: 49 mL/min (A) (by C-G formula based on SCr of 1.47 mg/dL (H)). Liver Function Tests: No results for input(s): AST, ALT, ALKPHOS, BILITOT, PROT, ALBUMIN in the last 168 hours. No results for input(s): LIPASE, AMYLASE in the last 168 hours. No results for input(s): AMMONIA in the last 168 hours. Coagulation Profile: Recent Labs  Lab 01/02/21 1623  INR 1.1   Cardiac Enzymes: No results for input(s): CKTOTAL, CKMB, CKMBINDEX, TROPONINI in the last 168 hours. BNP (last 3 results) No results for input(s): PROBNP in the  last 8760 hours. HbA1C: No results for input(s): HGBA1C in the last 72 hours. CBG: Recent Labs  Lab 01/02/21 1742 01/02/21 2143 01/03/21 0901 01/03/21 1208  GLUCAP 141* 312* 270* 128*   Lipid Profile: No results for input(s): CHOL, HDL, LDLCALC, TRIG, CHOLHDL, LDLDIRECT in the last 72 hours. Thyroid Function Tests: No results for input(s): TSH, T4TOTAL, FREET4, T3FREE, THYROIDAB in the last 72 hours. Anemia Panel: No results for input(s): VITAMINB12, FOLATE, FERRITIN, TIBC, IRON, RETICCTPCT in the last 72 hours. Sepsis Labs: Recent Labs  Lab 01/02/21 1605 01/02/21 1726  LATICACIDVEN 1.3 1.0    Recent Results (from the past 240 hour(s))  Blood culture (routine x 2)     Status: None (Preliminary result)   Collection Time: 01/02/21  4:05 PM   Specimen: BLOOD  Result Value Ref Range Status   Specimen Description BLOOD BLOOD RIGHT ARM  Final   Special Requests   Final    BOTTLES DRAWN AEROBIC AND ANAEROBIC Blood Culture results may not be optimal due to an inadequate volume of blood received in culture bottles   Culture  Final    NO GROWTH < 12 HOURS Performed at [facility], [address] [place], [place] [address]    Report Status PENDING  Incomplete  Blood culture (routine x 2)     Status: None (Preliminary result)   Collection Time: 01/02/21  4:05 PM   Specimen: BLOOD  Result Value Ref Range Status   Specimen Description BLOOD BLOOD LEFT FOREARM  Final   Special Requests   Final    BOTTLES DRAWN AEROBIC AND ANAEROBIC Blood Culture adequate volume   Culture   Final    NO GROWTH < 12 HOURS Performed at [facility], [address], [place], [place] [address]    Report Status PENDING  Incomplete  SARS CORONAVIRUS 2 (TAT 6-24 HRS) Nasopharyngeal Nasopharyngeal Swab     Status: None   Collection Time: 01/02/21  4:05 PM   Specimen: Nasopharyngeal Swab  Result Value Ref Range Status   SARS Coronavirus 2 NEGATIVE NEGATIVE Final    Comment:  (NOTE) SARS-CoV-2 target nucleic acids are NOT DETECTED.  The SARS-CoV-2 RNA is generally detectable in upper and lower respiratory specimens during the acute phase of infection. Negative results do not preclude SARS-CoV-2 infection, do not rule out co-infections with other pathogens, and should not be used as the sole basis for treatment or other patient management decisions. Negative results must be combined with clinical observations, patient history, and epidemiological information. The expected result is Negative.  Fact Sheet for Patients: HairSlick.no  Fact Sheet for Healthcare Providers: quierodirigir.com  This test is not yet approved or cleared by the [place] FDA and  has been authorized for detection and/or diagnosis of SARS-CoV-2 by FDA under an Emergency Use Authorization (EUA). This EUA will remain  in effect (meaning this test can be used) for the duration of the COVID-19 declaration under Se ction 564(b)(1) of the Act, 21 U.S.C. section 360bbb-3(b)(1), unless the authorization is terminated or revoked sooner.  Performed at [facility], [address], [place], [place] [address]          Radiology Studies: MR ANKLE RIGHT W WO CONTRAST  Result Date: 01/03/2021 CLINICAL DATA:  Diabetic foot and ankle wounds. History of incision and drainage on 12/01/2020 EXAM: MRI OF THE RIGHT ANKLE WITHOUT AND WITH CONTRAST TECHNIQUE: Multiplanar, multisequence MR imaging of the ankle was performed before and after the administration of intravenous contrast. CONTRAST:  14mL GADAVIST GADOBUTROL 1 MMOL/ML IV SOLN COMPARISON:  X-ray 11/29/2020, MRI 11/29/2020 FINDINGS: Technical note: Motion degraded exam. TENDONS Peroneal: Short segment split tear of the infra malleolar peroneus brevis tendon. Intact peroneus longus. No tenosynovitis. Posteromedial: Intact tibialis posterior, flexor hallucis longus and flexor digitorum  longus tendons. Anterior: Tibialis anterior tendon closely approximates the base of surgical incision site at the anteromedial aspect of the ankle. Anterior ankle tendons are intact. No significant tenosynovial fluid collection. Achilles: Intact. Plantar Fascia: Intact. LIGAMENTS Lateral: Grossly intact. Medial: Grossly intact. CARTILAGE Ankle Joint: No focal cartilage defect. No tibiotalar joint effusion. Subtalar Joints/Sinus Tarsi: No joint effusion or chondral defect. Preservation of the anatomic fat within the sinus tarsi. Bones: No acute fracture or malalignment. No bony erosion or cortical destruction. No bone marrow edema. Preservation of the fatty T1 bone marrow signal throughout the ankle and visualized foot. Other: Surgical incision sites at the anteromedial and anterolateral aspects of the ankle with transversely oriented peripherally enhancing tract extending between the incision sites (series 10, image 18), which is likely postsurgical. No residual fluid collection at  the anterior aspect of the ankle. No new fluid collections. Small superficial ulceration overlies the posteromedial aspect of the calcaneus without organized fluid collection or abscess. IMPRESSION: IMPRESSION 1. Interval postsurgical changes from incision and drainage at the anterior aspect of the ankle. No residual fluid collection or abscess. 2. Small superficial ulceration overlies the posteromedial aspect of the calcaneus without organized fluid collection or abscess. 3. Negative for acute osteomyelitis of the right ankle. Electronically Signed   By: Duanne Guess D.O.   On: 01/03/2021 08:17        Scheduled Meds: . aspirin EC  81 mg Oral Daily  . atorvastatin  40 mg Oral Daily  . famotidine  40 mg Oral Daily  . ferrous sulfate  325 mg Oral BID WC  . FLUoxetine  20 mg Oral Daily  . gabapentin  600 mg Oral QHS  . heparin  5,000 Units Subcutaneous Q8H  . insulin aspart  0-5 Units Subcutaneous QHS  . insulin aspart   0-9 Units Subcutaneous TID WC  . insulin detemir  30 Units Subcutaneous BID  . ipratropium  2 puff Inhalation QID  . loratadine  10 mg Oral Daily  . pantoprazole  40 mg Oral BID   Continuous Infusions: . ampicillin-sulbactam (UNASYN) IV 3 g (01/03/21 1155)  . lactated ringers 75 mL/hr at 01/03/21 0959  . [START ON 01/04/2021] vancomycin       LOS: 1 day    Time spent: 15 min    Tresa Moore, MD [facility] Pager [phone]  If 7PM-7AM, please contact night-coverage 01/03/2021, 3:33 PM

## 2021-01-03 NOTE — Progress Notes (Signed)
Inpatient Diabetes Program Recommendations  AACE/ADA: New Consensus Statement on Inpatient Glycemic Control (2015)  Target Ranges:  Prepandial:   less than 140 mg/dL      Peak postprandial:   less than 180 mg/dL (1-2 hours)      Critically ill patients:  140 - 180 mg/dL   Results for Emily Collins, Emily Collins (MRN [id number]) as of 01/04/2021 08:15  Ref. Range 01/03/2021 09:01 01/03/2021 12:08 01/03/2021 16:41 01/03/2021 21:07  Glucose-Capillary Latest Ref Range: 70 - 99 mg/dL 102 (H)  5 units NOVOLOG  30 units LEVEMIR  128 (H)  1 unit NOVOLOG  103 (H) 95    LEVEMIR HELD   Results for Emily Collins, Emily Collins (MRN [id number]) as of 01/04/2021 08:15  Ref. Range 01/04/2021 07:53  Glucose-Capillary Latest Ref Range: 70 - 99 mg/dL 235 (H)   Results for Emily Collins, Emily Collins (MRN [id number]) as of 01/03/2021 07:45  Ref. Range 11/29/2020 22:58  Hemoglobin A1C Latest Ref Range: 4.8 - 5.6 % 10.2 (H)     Admit: Wound infection right ankle, cellulitis of right lower extremity, diabetic ulcer of right foot heel  History: Diabetes, CKD  Home DM Meds: Humalog Inpen                             InPen settings include:Target blood glucose 100, Sensitivity 30, I:C ratio 10                             Levemir 60 units AM/ 40 units PM                             Trulicity 3mg  Qweek  Current Orders: Levemir 30 units BID                            Novolog 0-9 units TID AC + HS    MD- Note PM dose of Levemir HELD last PM--No order to hold  CBG only 115 this AM  May consider reducing Levemir to 20 units BID since AM CBG only 115 this AM with only 30 units Levemir on board     Endocrinologist: Dr. with [facility] Sax seen 10/27/2020--A1c at that visit was 11.2%--Was advised to do the following: -Send InPen reports to 12/25/2020 in the morning, prior to coming in on day of follow up appts.  - Encouraged her to log into [place], se we can consistently get DexCom data at appts.  - Reviewed target  sugars and target A1c.  - Adjust dose of Levemir to 40 units in the morning and 60 units in the evening.  - Reminded her of importance of taking Humalog before eating, not after. Advised her that her snacks count. She should take Humalog for every crumb of carbohydrates, whether she wants to classify them as meals or snacks, they all count. She should use the InPen appt and consistently take Humalog based on carb counting and blood sugar, based on calculation on the app. - Continue Trulicity. Adjust dose to 3 mg weekly   --Will follow patient during hospitalization--  Sara Lee RN, MSN, CDE Diabetes Coordinator Inpatient Glycemic Control Team Team Pager: [phone] (8a-5p)

## 2021-01-04 LAB — GLUCOSE, CAPILLARY
Glucose-Capillary: 115 mg/dL — ABNORMAL HIGH (ref 70–99)
Glucose-Capillary: 256 mg/dL — ABNORMAL HIGH (ref 70–99)
Glucose-Capillary: 252 mg/dL — ABNORMAL HIGH (ref 70–99)
Glucose-Capillary: 107 mg/dL — ABNORMAL HIGH (ref 70–99)

## 2021-01-04 LAB — BASIC METABOLIC PANEL
Anion gap: 9 (ref 5–15)
BUN: 22 mg/dL — ABNORMAL HIGH (ref 6–20)
CO2: 20 mmol/L — ABNORMAL LOW (ref 22–32)
Calcium: 7.7 mg/dL — ABNORMAL LOW (ref 8.9–10.3)
Chloride: 103 mmol/L (ref 98–111)
Creatinine, Ser: 1.45 mg/dL — ABNORMAL HIGH (ref 0.44–1.00)
GFR, Estimated: 43 mL/min — ABNORMAL LOW (ref >60)
Glucose, Bld: 109 mg/dL — ABNORMAL HIGH (ref 70–99)
Potassium: 4.4 mmol/L (ref 3.5–5.1)
Sodium: 132 mmol/L — ABNORMAL LOW (ref 135–145)

## 2021-01-04 MED ORDER — NYSTATIN 100000 UNIT/GM EX POWD
Freq: Two times a day (BID) | CUTANEOUS | Status: DC
  Filled 2021-01-04: qty 15

## 2021-01-04 MED ORDER — FLUCONAZOLE 100 MG PO TABS
100.0000 mg | ORAL_TABLET | Freq: Every day | ORAL | Status: AC
  Administered 2021-01-04 – 2021-01-06 (×3): 100 mg via ORAL
  Filled 2021-01-04 (×3): qty 1

## 2021-01-04 MED ORDER — INSULIN DETEMIR 100 UNIT/ML ~~LOC~~ SOLN
20.0000 [IU] | Freq: Two times a day (BID) | SUBCUTANEOUS | Status: DC
  Administered 2021-01-04 – 2021-01-06 (×4): 20 [IU] via SUBCUTANEOUS
  Filled 2021-01-04 (×6): qty 0.2

## 2021-01-04 NOTE — Progress Notes (Signed)
PROGRESS NOTE    Emily Collins  ZOX:[id number] DOB: Jan 14, 1966 DOA: 01/02/2021 PCP: Oswaldo ConroyBender, Abby Daneele, MD  Brief Narrative:   55 y.o. female with medical history significant of DM-tI, HTN. HLD, asthma, GERD, depression with anxiety, former smoker, CKD-IIIa, anemia, diabetic foot ulcer with infection, presents with right foot ulcer and foot pain.   Patient was recently hospitalized from 3/7-3/17 due to right foot ulcer, right ankle abscess and cellulitis.  Patient is s/p of I&D, currently on VAC. Patient states that her pain in right foot and ankle has been worsening in the past several days.  Patient has increased drainage from two deep wounds of right ankle, worsening erythema in right foot and ankle, extending up to her right lower leg. She also has a small ulcer in right heal.  Patient attributes pain to wound VAC being incorrectly placed at home.  Had her husband remove the wound VAC and now has wet-to-dry dressings on the area.  Still in a fair bit of pain.  Communicated with Dr. Lilian KapurMcDonald from podiatry who will see the patient.  Recommendations appreciated.  After podiatry saw and evaluated patient decision was made to take patient to the OR during this hospital admission.  Plan for OR with podiatry on 4/13.   Assessment & Plan:   Principal Problem:   Wound infection_right ankle Active Problems:   GERD (gastroesophageal reflux disease)   Hyperlipidemia   Hyperkalemia   DM type 1 with diabetic peripheral neuropathy (HCC)   Diabetic infection of right foot (HCC)   Cellulitis of right lower extremity   Diabetic ulcer of right foot (HCC)   Asthma   Depression with anxiety   CKD (chronic kidney disease), stage IIIa   Normocytic anemia  Wound infection of right ankle, cellulitis of right lower extremity Diabetic foot ulcer right lower extremity Does not meet sepsis criteria Has chronic leukocytosis Hemodynamically stable MRI read right foot, no osteomyelitis or  abscess Podiatry Dr. Lilian KapurMcDonald consulted Plan: Continue IV Unasyn As needed pain control Follow blood cultures Monitor vitals and fever curve Plan for OR with podiatry on 4/13  GERD (gastroesophageal reflux disease) -Protonix and pepcid  Hyperlipidemia -Lipitor  Hyperkalemia: Potassium 5.2 -Leukoma 5 g  DM type 1 with diabetic peripheral neuropathy [facility](HCC): Recent recent A1c 10.2, poorly controlled.  Patient is taking Trulicity, Humalog and Levemir -Sliding scale insulin -Continue Levemir 30 units twice daily -Carb modified diet  Asthma: Stable -Bronchodilators  Depression with anxiety -Continue home medications  CKD (chronic kidney disease), stage IIIa: Recent baseline creatinine 1.1-1.02.  Her creatinine is 1.41, BUN 25, slightly worsening than baseline -Hold IV fluids for now patient is tolerating p.o. urinating appropriately -Recheck creatinine in a.m.  Normocytic anemia: Hemoglobin 9.6 (8.0 on 12/09/2020) -Continue iron supplement   DVT prophylaxis: SQ heparin Code Status: Full Family Communication: None today.  Offered to call but patient declined Disposition Plan: Status is: Inpatient  Remains inpatient appropriate because:Inpatient level of care appropriate due to severity of illness   Dispo: The patient is from: Home              Anticipated d/c is to: Home              Patient currently is not medically stable to d/c.   Difficult to place patient No  Appreciate podiatry recommendations.  Plan for OR on 4/13.     Level of care: Med-Surg  Consultants:   Podiatry  Procedures:   None  Antimicrobials:     Unasyn  Subjective: Patient seen and examined.  Pain in affected extremity has improved.  Plan for OR with podiatry for debridement 4/13  Objective: Vitals:   01/03/21 2304 01/04/21 0533 01/04/21 0800 01/04/21 1143  BP: (!) 115/39 (!) 114/51 126/67 137/74  Pulse: 85 80 83 78  Resp: 16 20 18 18   Temp: 99 F (37.2 C) 97.8 F  (36.6 C) 98.1 F (36.7 C) 97.8 F (36.6 C)  TempSrc: Oral Oral Oral Oral  SpO2: 94% (!) 88% 99% 95%  Weight:      Height:        Intake/Output Summary (Last 24 hours) at 01/04/2021 1228 Last data filed at 01/04/2021 0900 Gross per 24 hour  Intake 1952.52 ml  Output --  Net 1952.52 ml   Filed Weights   01/02/21 1456  Weight: 104.3 kg    Examination:  General exam: Appears calm and comfortable  Respiratory system: Clear to auscultation. Respiratory effort normal. Cardiovascular system: S1 & S2 heard, RRR. No JVD, murmurs, rubs, gallops or clicks. No pedal edema. Gastrointestinal system: Abdomen is nondistended, soft and nontender. No organomegaly or masses felt. Normal bowel sounds heard. Central nervous system: Alert and oriented. No focal neurological deficits. Extremities: Symmetric 5 x 5 power. Skin: has two deep wounds of right ankle, small ulcer in right heel.  Has pain, swelling, erythema in right foot, right ankle and lower leg. Psychiatry: Judgement and insight appear normal. Mood & affect appropriate.     Data Reviewed: I have personally reviewed following labs and imaging studies  CBC: Recent Labs  Lab 01/02/21 1458 01/03/21 0510  WBC 19.7* 14.9*  NEUTROABS 15.3*  --   HGB 9.6* 8.7*  HCT 31.0* 27.8*  MCV 87.3 87.7  PLT 388 345   Basic Metabolic Panel: Recent Labs  Lab 01/02/21 1458 01/03/21 0510 01/04/21 0800  NA 134* 132* 132*  K 5.2* 5.1 4.4  CL 105 104 103  CO2 23 21* 20*  GLUCOSE 133* 303* 109*  BUN 25* 27* 22*  CREATININE 1.41* 1.47* 1.45*  CALCIUM 8.3* 7.7* 7.7*   GFR: Estimated Creatinine Clearance: 49.7 mL/min (A) (by C-G formula based on SCr of 1.45 mg/dL (H)). Liver Function Tests: No results for input(s): AST, ALT, ALKPHOS, BILITOT, PROT, ALBUMIN in the last 168 hours. No results for input(s): LIPASE, AMYLASE in the last 168 hours. No results for input(s): AMMONIA in the last 168 hours. Coagulation Profile: Recent Labs  Lab  01/02/21 1623  INR 1.1   Cardiac Enzymes: No results for input(s): CKTOTAL, CKMB, CKMBINDEX, TROPONINI in the last 168 hours. BNP (last 3 results) No results for input(s): PROBNP in the last 8760 hours. HbA1C: No results for input(s): HGBA1C in the last 72 hours. CBG: Recent Labs  Lab 01/03/21 1208 01/03/21 1641 01/03/21 2107 01/04/21 0753 01/04/21 1140  GLUCAP 128* 103* 95 115* 256*   Lipid Profile: No results for input(s): CHOL, HDL, LDLCALC, TRIG, CHOLHDL, LDLDIRECT in the last 72 hours. Thyroid Function Tests: No results for input(s): TSH, T4TOTAL, FREET4, T3FREE, THYROIDAB in the last 72 hours. Anemia Panel: No results for input(s): VITAMINB12, FOLATE, FERRITIN, TIBC, IRON, RETICCTPCT in the last 72 hours. Sepsis Labs: Recent Labs  Lab 01/02/21 1605 01/02/21 1726  LATICACIDVEN 1.3 1.0    Recent Results (from the past 240 hour(s))  Blood culture (routine x 2)     Status: None (Preliminary result)   Collection Time: 01/02/21  4:05 PM   Specimen: BLOOD  Result Value Ref Range Status   Specimen  Description BLOOD BLOOD RIGHT ARM  Final   Special Requests   Final    BOTTLES DRAWN AEROBIC AND ANAEROBIC Blood Culture results may not be optimal due to an inadequate volume of blood received in culture bottles   Culture   Final    NO GROWTH 2 DAYS Performed at [facility], [address], [place], [place] [address]    Report Status PENDING  Incomplete  Blood culture (routine x 2)     Status: None (Preliminary result)   Collection Time: 01/02/21  4:05 PM   Specimen: BLOOD  Result Value Ref Range Status   Specimen Description BLOOD BLOOD LEFT FOREARM  Final   Special Requests   Final    BOTTLES DRAWN AEROBIC AND ANAEROBIC Blood Culture adequate volume   Culture   Final    NO GROWTH 2 DAYS Performed at [facility], [address] [place], [place] [address]    Report Status PENDING  Incomplete  SARS CORONAVIRUS 2 (TAT 6-24 HRS) Nasopharyngeal  Nasopharyngeal Swab     Status: None   Collection Time: 01/02/21  4:05 PM   Specimen: Nasopharyngeal Swab  Result Value Ref Range Status   SARS Coronavirus 2 NEGATIVE NEGATIVE Final    Comment: (NOTE) SARS-CoV-2 target nucleic acids are NOT DETECTED.  The SARS-CoV-2 RNA is generally detectable in upper and lower respiratory specimens during the acute phase of infection. Negative results do not preclude SARS-CoV-2 infection, do not rule out co-infections with other pathogens, and should not be used as the sole basis for treatment or other patient management decisions. Negative results must be combined with clinical observations, patient history, and epidemiological information. The expected result is Negative.  Fact Sheet for Patients: HairSlick.no  Fact Sheet for Healthcare Providers: quierodirigir.com  This test is not yet approved or cleared by the [place] FDA and  has been authorized for detection and/or diagnosis of SARS-CoV-2 by FDA under an Emergency Use Authorization (EUA). This EUA will remain  in effect (meaning this test can be used) for the duration of the COVID-19 declaration under Se ction 564(b)(1) of the Act, 21 U.S.C. section 360bbb-3(b)(1), unless the authorization is terminated or revoked sooner.  Performed at [facility], [address] [place], [place] [address]          Radiology Studies: MR ANKLE RIGHT W WO CONTRAST  Result Date: 01/03/2021 CLINICAL DATA:  Diabetic foot and ankle wounds. History of incision and drainage on 12/01/2020 EXAM: MRI OF THE RIGHT ANKLE WITHOUT AND WITH CONTRAST TECHNIQUE: Multiplanar, multisequence MR imaging of the ankle was performed before and after the administration of intravenous contrast. CONTRAST:  11mL GADAVIST GADOBUTROL 1 MMOL/ML IV SOLN COMPARISON:  X-ray 11/29/2020, MRI 11/29/2020 FINDINGS: Technical note: Motion degraded exam. TENDONS Peroneal:  Short segment split tear of the infra malleolar peroneus brevis tendon. Intact peroneus longus. No tenosynovitis. Posteromedial: Intact tibialis posterior, flexor hallucis longus and flexor digitorum longus tendons. Anterior: Tibialis anterior tendon closely approximates the base of surgical incision site at the anteromedial aspect of the ankle. Anterior ankle tendons are intact. No significant tenosynovial fluid collection. Achilles: Intact. Plantar Fascia: Intact. LIGAMENTS Lateral: Grossly intact. Medial: Grossly intact. CARTILAGE Ankle Joint: No focal cartilage defect. No tibiotalar joint effusion. Subtalar Joints/Sinus Tarsi: No joint effusion or chondral defect. Preservation of the anatomic fat within the sinus tarsi. Bones: No acute fracture or malalignment. No bony erosion or cortical destruction. No bone marrow edema. Preservation of the fatty T1 bone marrow signal  throughout the ankle and visualized foot. Other: Surgical incision sites at the anteromedial and anterolateral aspects of the ankle with transversely oriented peripherally enhancing tract extending between the incision sites (series 10, image 18), which is likely postsurgical. No residual fluid collection at the anterior aspect of the ankle. No new fluid collections. Small superficial ulceration overlies the posteromedial aspect of the calcaneus without organized fluid collection or abscess. IMPRESSION: IMPRESSION 1. Interval postsurgical changes from incision and drainage at the anterior aspect of the ankle. No residual fluid collection or abscess. 2. Small superficial ulceration overlies the posteromedial aspect of the calcaneus without organized fluid collection or abscess. 3. Negative for acute osteomyelitis of the right ankle. Electronically Signed   By: Duanne Guess D.O.   On: 01/03/2021 08:17        Scheduled Meds: . aspirin EC  81 mg Oral Daily  . atorvastatin  40 mg Oral Daily  . famotidine  40 mg Oral Daily  . ferrous  sulfate  325 mg Oral BID WC  . fluconazole  100 mg Oral Daily  . FLUoxetine  20 mg Oral Daily  . gabapentin  600 mg Oral QHS  . heparin  5,000 Units Subcutaneous Q8H  . insulin aspart  0-5 Units Subcutaneous QHS  . insulin aspart  0-9 Units Subcutaneous TID WC  . insulin detemir  20 Units Subcutaneous BID  . ipratropium  2 puff Inhalation QID  . loratadine  10 mg Oral Daily  . nystatin   Topical BID  . pantoprazole  40 mg Oral BID   Continuous Infusions: . ampicillin-sulbactam (UNASYN) IV 3 g (01/04/21 1225)     LOS: 2 days    Time spent: 15 min    Tresa Moore, MD [facility] Pager [phone]  If 7PM-7AM, please contact night-coverage 01/04/2021, 12:28 PM

## 2021-01-04 NOTE — Plan of Care (Signed)
OR tomorrow 4/13 at 5pm NPO past 9AM after breakfast order placed  Sharl Ma, DPM 01/04/2021

## 2021-01-05 DIAGNOSIS — T148XXA Other injury of unspecified body region, initial encounter: Secondary | ICD-10-CM

## 2021-01-05 DIAGNOSIS — N1831 Chronic kidney disease, stage 3a: Secondary | ICD-10-CM

## 2021-01-05 DIAGNOSIS — E1042 Type 1 diabetes mellitus with diabetic polyneuropathy: Secondary | ICD-10-CM

## 2021-01-05 DIAGNOSIS — L089 Local infection of the skin and subcutaneous tissue, unspecified: Secondary | ICD-10-CM

## 2021-01-05 DIAGNOSIS — L03115 Cellulitis of right lower limb: Principal | ICD-10-CM

## 2021-01-05 LAB — GLUCOSE, CAPILLARY
Glucose-Capillary: 169 mg/dL — ABNORMAL HIGH (ref 70–99)
Glucose-Capillary: 318 mg/dL — ABNORMAL HIGH (ref 70–99)
Glucose-Capillary: 127 mg/dL — ABNORMAL HIGH (ref 70–99)
Glucose-Capillary: 284 mg/dL — ABNORMAL HIGH (ref 70–99)

## 2021-01-05 LAB — BASIC METABOLIC PANEL
Anion gap: 10 (ref 5–15)
BUN: 21 mg/dL — ABNORMAL HIGH (ref 6–20)
CO2: 22 mmol/L (ref 22–32)
Calcium: 7.9 mg/dL — ABNORMAL LOW (ref 8.9–10.3)
Chloride: 103 mmol/L (ref 98–111)
Creatinine, Ser: 1.52 mg/dL — ABNORMAL HIGH (ref 0.44–1.00)
GFR, Estimated: 40 mL/min — ABNORMAL LOW (ref >60)
Glucose, Bld: 135 mg/dL — ABNORMAL HIGH (ref 70–99)
Potassium: 4.6 mmol/L (ref 3.5–5.1)
Sodium: 135 mmol/L (ref 135–145)

## 2021-01-05 MED ORDER — POLYETHYLENE GLYCOL 3350 17 G PO PACK
17.0000 g | PACK | Freq: Every day | ORAL | Status: DC
  Filled 2021-01-05: qty 1

## 2021-01-05 NOTE — Plan of Care (Signed)
I spoke with the patient by phone, unfortunately the OR is running behind and then lost place due to emergency surgery and would likely not start until 9 or 10 PM.  Gave her the option of doing this midmorning on Friday during my elective case block which she would prefer to do.  N.p.o. is lifted she may have dinner tonight and tomorrow and n.p.o. past midnight on Friday 4/15.   Sharl Ma, DPM 01/05/2021

## 2021-01-05 NOTE — Plan of Care (Signed)
Pt Axox4. Calm and cooperative and able to voice needs. Prn pain med administered overnight, effective. Pt to be NPO after breakfast. On IV Abx. Vitals stable. Safety measures in place. Will continue to monitor.  Problem: Education: Goal: Knowledge of General Education information will improve Description: Including pain rating scale, medication(s)/side effects and non-pharmacologic comfort measures Outcome: Progressing   Problem: Health Behavior/Discharge Planning: Goal: Ability to manage health-related needs will improve Outcome: Progressing   Problem: Clinical Measurements: Goal: Ability to maintain clinical measurements within normal limits will improve Outcome: Progressing Goal: Will remain free from infection Outcome: Progressing Goal: Diagnostic test results will improve Outcome: Progressing Goal: Respiratory complications will improve Outcome: Progressing Goal: Cardiovascular complication will be avoided Outcome: Progressing   Problem: Activity: Goal: Risk for activity intolerance will decrease Outcome: Progressing   Problem: Nutrition: Goal: Adequate nutrition will be maintained Outcome: Progressing   Problem: Coping: Goal: Level of anxiety will decrease Outcome: Progressing   Problem: Elimination: Goal: Will not experience complications related to bowel motility Outcome: Progressing Goal: Will not experience complications related to urinary retention Outcome: Progressing   Problem: Pain Managment: Goal: General experience of comfort will improve Outcome: Progressing   Problem: Safety: Goal: Ability to remain free from injury will improve Outcome: Progressing   Problem: Skin Integrity: Goal: Risk for impaired skin integrity will decrease Outcome: Progressing

## 2021-01-05 NOTE — Progress Notes (Signed)
PROGRESS NOTE    Emily Collins  SFK:[id number] DOB: 12-03-1965 DOA: 01/02/2021 PCP: Oswaldo Conroy, MD  Brief Narrative:  55 y.o. female with medical history significant of DM-tI, HTN. HLD, asthma, GERD, depression with anxiety, former smoker, CKD-IIIa, anemia, diabetic foot ulcer with infection, presents with right foot ulcer and foot pain. Patient was recently hospitalized from 3/7-3/17 due to right foot ulcer, right ankle abscess and cellulitis.  Patient is s/p I&D, on VAC. Patient attributes pain to wound VAC being incorrectly placed at home.  Had her husband remove the wound VAC and now has wet-to-dry dressings on the area.  Podiatry was consulted.  Patient was hospitalized.    Assessment & Plan:   Wound infection of right ankle, cellulitis of right lower extremity Diabetic foot ulcer right lower extremity No evidence for sepsis on admission.  Patient underwent MRI of the right foot.  Podiatry consulted.  Patient started on IV Unasyn.  Plan is for patient to go to the OR this afternoon.  Pain control.  Patient noted to be afebrile.  DM type 1 with diabetic peripheral neuropathy   Recent recent A1c 10.2, poorly controlled.  Patient is taking Trulicity, Humalog and Levemir Continue SSI.  Continue Levemir.  History of asthma Stable.  Depression with anxiety Continue home medications  CKD (chronic kidney disease), stage IIIa:  Based on previous labs her baseline creatinine seems to be around 1.5.  Seems to be close to it at this time.  Monitor urine output.    Normocytic anemia No evidence of overt bleeding.  Monitor closely.    GERD (gastroesophageal reflux disease) Stable.  Continue home medications.    Hyperlipidemia Continue Lipitor  Hyperkalemia Was given Lokelma with improvement potassium levels.    DVT prophylaxis: SQ heparin Code Status: Full Family Communication: Discussed with the patient and her husband who was at the bedside. Disposition Plan:  Hopefully return home when improved  Status is: Inpatient  Remains inpatient appropriate because:Inpatient level of care appropriate due to severity of illness   Dispo: The patient is from: Home              Anticipated d/c is to: Home              Patient currently is not medically stable to d/c.   Difficult to place patient No  Appreciate podiatry recommendations.  Plan for OR on 4/13.     Level of care: Med-Surg  Consultants:   Podiatry  Procedures:   None  Antimicrobials:     Unasyn   Subjective: Patient continues to have 9 out of 10 pain in the right foot.  Denies any nausea vomiting.  Complains of constipation.    Objective: Vitals:   01/04/21 2025 01/05/21 0056 01/05/21 0356 01/05/21 0813  BP: (!) 103/49 (!) 123/52 118/68 132/61  Pulse: 73 75 68 80  Resp: 20 18 20 18   Temp: 98.5 F (36.9 C) 98 F (36.7 C) 98.3 F (36.8 C) (!) 97.4 F (36.3 C)  TempSrc: Oral Oral Oral Oral  SpO2: 96% 96% 95% 98%  Weight:      Height:        Intake/Output Summary (Last 24 hours) at 01/05/2021 1105 Last data filed at 01/05/2021 0458 Gross per 24 hour  Intake 638 ml  Output --  Net 638 ml   Filed Weights   01/02/21 1456  Weight: 104.3 kg    Examination:  General appearance: Awake alert.  In no distress Resp: Clear to auscultation  bilaterally.  Normal effort Cardio: S1-S2 is normal regular.  No S3-S4.  No rubs murmurs or bruit GI: Abdomen is soft.  Nontender nondistended.  Bowel sounds are present normal.  No masses organomegaly Extremities: Right foot is covered in dressing. Neurologic: Alert and oriented x3.  No focal neurological deficits.      Data Reviewed: I have personally reviewed following labs and imaging studies  CBC: Recent Labs  Lab 01/02/21 1458 01/03/21 0510  WBC 19.7* 14.9*  NEUTROABS 15.3*  --   HGB 9.6* 8.7*  HCT 31.0* 27.8*  MCV 87.3 87.7  PLT 388 345   Basic Metabolic Panel: Recent Labs  Lab 01/02/21 1458 01/03/21 0510  01/04/21 0800 01/05/21 0532  NA 134* 132* 132* 135  K 5.2* 5.1 4.4 4.6  CL 105 104 103 103  CO2 23 21* 20* 22  GLUCOSE 133* 303* 109* 135*  BUN 25* 27* 22* 21*  CREATININE 1.41* 1.47* 1.45* 1.52*  CALCIUM 8.3* 7.7* 7.7* 7.9*   GFR: Estimated Creatinine Clearance: 47.4 mL/min (A) (by C-G formula based on SCr of 1.52 mg/dL (H)).  Coagulation Profile: Recent Labs  Lab 01/02/21 1623  INR 1.1   CBG: Recent Labs  Lab 01/04/21 0753 01/04/21 1140 01/04/21 1634 01/04/21 2125 01/05/21 0739  GLUCAP 115* 256* 252* 107* 169*   Sepsis Labs: Recent Labs  Lab 01/02/21 1605 01/02/21 1726  LATICACIDVEN 1.3 1.0    Recent Results (from the past 240 hour(s))  Blood culture (routine x 2)     Status: None (Preliminary result)   Collection Time: 01/02/21  4:05 PM   Specimen: BLOOD  Result Value Ref Range Status   Specimen Description BLOOD BLOOD RIGHT ARM  Final   Special Requests   Final    BOTTLES DRAWN AEROBIC AND ANAEROBIC Blood Culture results may not be optimal due to an inadequate volume of blood received in culture bottles   Culture   Final    NO GROWTH 3 DAYS Performed at [facility], [address], [place], [place] [address]    Report Status PENDING  Incomplete  Blood culture (routine x 2)     Status: None (Preliminary result)   Collection Time: 01/02/21  4:05 PM   Specimen: BLOOD  Result Value Ref Range Status   Specimen Description BLOOD BLOOD LEFT FOREARM  Final   Special Requests   Final    BOTTLES DRAWN AEROBIC AND ANAEROBIC Blood Culture adequate volume   Culture   Final    NO GROWTH 3 DAYS Performed at [facility], [address], [place], [place] [address]    Report Status PENDING  Incomplete  SARS CORONAVIRUS 2 (TAT 6-24 HRS) Nasopharyngeal Nasopharyngeal Swab     Status: None   Collection Time: 01/02/21  4:05 PM   Specimen: Nasopharyngeal Swab  Result Value Ref Range Status   SARS Coronavirus 2 NEGATIVE NEGATIVE Final    Comment:  (NOTE) SARS-CoV-2 target nucleic acids are NOT DETECTED.  The SARS-CoV-2 RNA is generally detectable in upper and lower respiratory specimens during the acute phase of infection. Negative results do not preclude SARS-CoV-2 infection, do not rule out co-infections with other pathogens, and should not be used as the sole basis for treatment or other patient management decisions. Negative results must be combined with clinical observations, patient history, and epidemiological information. The expected result is Negative.  Fact Sheet for Patients: HairSlick.no  Fact Sheet for Healthcare Providers: quierodirigir.com  This test is not yet approved or cleared by  the [employer] and  has been authorized for detection and/or diagnosis of SARS-CoV-2 by FDA under an Emergency Use Authorization (EUA). This EUA will remain  in effect (meaning this test can be used) for the duration of the COVID-19 declaration under Se ction 564(b)(1) of the Act, 21 U.S.C. section 360bbb-3(b)(1), unless the authorization is terminated or revoked sooner.  Performed at [facility], [address], [place], [place] [address]          Radiology Studies: No results found.    Scheduled Meds: . aspirin EC  81 mg Oral Daily  . atorvastatin  40 mg Oral Daily  . famotidine  40 mg Oral Daily  . ferrous sulfate  325 mg Oral BID WC  . fluconazole  100 mg Oral Daily  . FLUoxetine  20 mg Oral Daily  . gabapentin  600 mg Oral QHS  . heparin  5,000 Units Subcutaneous Q8H  . insulin aspart  0-5 Units Subcutaneous QHS  . insulin aspart  0-9 Units Subcutaneous TID WC  . insulin detemir  20 Units Subcutaneous BID  . ipratropium  2 puff Inhalation QID  . loratadine  10 mg Oral Daily  . nystatin   Topical BID  . pantoprazole  40 mg Oral BID   Continuous Infusions: . ampicillin-sulbactam (UNASYN) IV 3 g (01/05/21 0458)     LOS: 3 days      Osvaldo Shipper, MD [facility] Pager On [email or web address]  01/05/2021, 11:05 AM

## 2021-01-06 LAB — CBC
HCT: 28.5 % — ABNORMAL LOW (ref 36.0–46.0)
Hemoglobin: 8.9 g/dL — ABNORMAL LOW (ref 12.0–15.0)
MCH: 26.7 pg (ref 26.0–34.0)
MCHC: 31.2 g/dL (ref 30.0–36.0)
MCV: 85.6 fL (ref 80.0–100.0)
Platelets: 450 10*3/uL — ABNORMAL HIGH (ref 150–400)
RBC: 3.33 MIL/uL — ABNORMAL LOW (ref 3.87–5.11)
RDW: 16.7 % — ABNORMAL HIGH (ref 11.5–15.5)
WBC: 10.2 10*3/uL (ref 4.0–10.5)
nRBC: 0 % (ref 0.0–0.2)

## 2021-01-06 LAB — BASIC METABOLIC PANEL
Anion gap: 10 (ref 5–15)
BUN: 15 mg/dL (ref 6–20)
CO2: 23 mmol/L (ref 22–32)
Calcium: 8.3 mg/dL — ABNORMAL LOW (ref 8.9–10.3)
Chloride: 104 mmol/L (ref 98–111)
Creatinine, Ser: 1.35 mg/dL — ABNORMAL HIGH (ref 0.44–1.00)
GFR, Estimated: 46 mL/min — ABNORMAL LOW (ref >60)
Glucose, Bld: 264 mg/dL — ABNORMAL HIGH (ref 70–99)
Potassium: 4.6 mmol/L (ref 3.5–5.1)
Sodium: 137 mmol/L (ref 135–145)

## 2021-01-06 LAB — GLUCOSE, CAPILLARY
Glucose-Capillary: 267 mg/dL — ABNORMAL HIGH (ref 70–99)
Glucose-Capillary: 185 mg/dL — ABNORMAL HIGH (ref 70–99)
Glucose-Capillary: 164 mg/dL — ABNORMAL HIGH (ref 70–99)
Glucose-Capillary: 221 mg/dL — ABNORMAL HIGH (ref 70–99)
Glucose-Capillary: 172 mg/dL — ABNORMAL HIGH (ref 70–99)

## 2021-01-06 MED ORDER — INSULIN DETEMIR 100 UNIT/ML ~~LOC~~ SOLN
12.0000 [IU] | Freq: Every day | SUBCUTANEOUS | Status: AC
  Administered 2021-01-06: 12 [IU] via SUBCUTANEOUS
  Filled 2021-01-06: qty 0.12

## 2021-01-06 MED ORDER — INSULIN DETEMIR 100 UNIT/ML ~~LOC~~ SOLN: 24.0000 [IU] | Freq: Two times a day (BID) | SUBCUTANEOUS | Status: DC

## 2021-01-06 MED ORDER — INSULIN DETEMIR 100 UNIT/ML ~~LOC~~ SOLN
24.0000 [IU] | Freq: Two times a day (BID) | SUBCUTANEOUS | Status: DC
  Administered 2021-01-07 – 2021-01-08 (×2): 24 [IU] via SUBCUTANEOUS
  Filled 2021-01-06 (×4): qty 0.24

## 2021-01-06 NOTE — Progress Notes (Signed)
PROGRESS NOTE    Emily Collins  AUQ:[id number] DOB: 1966-07-17 DOA: 01/02/2021 PCP: Oswaldo Conroy, MD  Brief Narrative:  55 y.o. female with medical history significant of DM-tI, HTN. HLD, asthma, GERD, depression with anxiety, former smoker, CKD-IIIa, anemia, diabetic foot ulcer with infection, presents with right foot ulcer and foot pain. Patient was recently hospitalized from 3/7-3/17 due to right foot ulcer, right ankle abscess and cellulitis.  Patient is s/p I&D, on VAC. Patient attributes pain to wound VAC being incorrectly placed at home.  Had her husband remove the wound VAC and now has wet-to-dry dressings on the area.  Podiatry was consulted.  Patient was hospitalized.    Assessment & Plan:   Wound infection of right ankle, cellulitis of right lower extremity Diabetic foot ulcer right lower extremity No evidence for sepsis on admission.  Patient underwent MRI of the right foot.  Podiatry consulted.  Patient started on IV Unasyn.   Plan was for the patient to have surgery yesterday but due to other emergency cases she could not be taken to the OR.  Now plan is for surgery on Friday.   Pain is reasonably well controlled.  WBC is noted to be better.  She is afebrile.  DM type 1 with diabetic peripheral neuropathy   Recent recent A1c 10.2, poorly controlled.  Patient is taking Trulicity, Humalog and Levemir Continue SSI.  Continue Levemir.  CBGs noted to be fluctuating.  We will go up slightly on the dose of her insulin.  History of asthma Stable.  Depression with anxiety Continue home medications  CKD (chronic kidney disease), stage IIIa:  Based on previous labs her baseline creatinine seems to be around 1.5.  Seems to be close to it at this time.  Monitor urine output.    Normocytic anemia No evidence of overt bleeding.  Monitor closely.    GERD (gastroesophageal reflux disease) Stable.  Continue home medications.    Hyperlipidemia Continue  Lipitor  Hyperkalemia Was given Lokelma with improvement potassium levels.    DVT prophylaxis: SQ heparin Code Status: Full Family Communication: Discussed with patient. Disposition Plan: Hopefully return home when improved  Status is: Inpatient  Remains inpatient appropriate because:Inpatient level of care appropriate due to severity of illness   Dispo: The patient is from: Home              Anticipated d/c is to: Home              Patient currently is not medically stable to d/c.   Difficult to place patient No   Consultants:   Podiatry  Procedures:   None  Antimicrobials:     Unasyn   Subjective: Patient currently has a pain level of 6 out of 10 in intensity.  Denies any other complaints.  Tolerating her diet.  Has been passing urine without difficulty   Objective: Vitals:   01/05/21 2030 01/05/21 2352 01/06/21 0553 01/06/21 0750  BP: 129/60 (!) 124/52 (!) 132/52 (!) 114/53  Pulse: 79 81 73 75  Resp: 18 18 18 14   Temp: 98.2 F (36.8 C) 98.4 F (36.9 C) 98.2 F (36.8 C) 97.9 F (36.6 C)  TempSrc: Oral Oral  Oral  SpO2: 98% 96% 97% 94%  Weight:      Height:        Intake/Output Summary (Last 24 hours) at 01/06/2021 1039 Last data filed at 01/06/2021 1007 Gross per 24 hour  Intake 260 ml  Output --  Net 260 ml  Filed Weights   01/02/21 1456  Weight: 104.3 kg    Examination:  General appearance: Awake alert.  In no distress Resp: Clear to auscultation bilaterally.  Normal effort Cardio: S1-S2 is normal regular.  No S3-S4.  No rubs murmurs or bruit GI: Abdomen is soft.  Nontender nondistended.  Bowel sounds are present normal.  No masses organomegaly Extremities: Right foot is covered in dressing.  Mild erythema noted.  Able to move her toes. Neurologic: Alert and oriented x3.  No focal neurological deficits.        Data Reviewed: I have personally reviewed following labs and imaging studies  CBC: Recent Labs  Lab 01/02/21 1458  01/03/21 0510 01/06/21 0539  WBC 19.7* 14.9* 10.2  NEUTROABS 15.3*  --   --   HGB 9.6* 8.7* 8.9*  HCT 31.0* 27.8* 28.5*  MCV 87.3 87.7 85.6  PLT 388 345 450*   Basic Metabolic Panel: Recent Labs  Lab 01/02/21 1458 01/03/21 0510 01/04/21 0800 01/05/21 0532 01/06/21 0539  NA 134* 132* 132* 135 137  K 5.2* 5.1 4.4 4.6 4.6  CL 105 104 103 103 104  CO2 23 21* 20* 22 23  GLUCOSE 133* 303* 109* 135* 264*  BUN 25* 27* 22* 21* 15  CREATININE 1.41* 1.47* 1.45* 1.52* 1.35*  CALCIUM 8.3* 7.7* 7.7* 7.9* 8.3*   GFR: Estimated Creatinine Clearance: 53.4 mL/min (A) (by C-G formula based on SCr of 1.35 mg/dL (H)).  Coagulation Profile: Recent Labs  Lab 01/02/21 1623  INR 1.1   CBG: Recent Labs  Lab 01/05/21 0739 01/05/21 1145 01/05/21 1649 01/05/21 2114 01/06/21 0755  GLUCAP 169* 318* 127* 284* 267*   Sepsis Labs: Recent Labs  Lab 01/02/21 1605 01/02/21 1726  LATICACIDVEN 1.3 1.0    Recent Results (from the past 240 hour(s))  Blood culture (routine x 2)     Status: None (Preliminary result)   Collection Time: 01/02/21  4:05 PM   Specimen: BLOOD  Result Value Ref Range Status   Specimen Description BLOOD BLOOD RIGHT ARM  Final   Special Requests   Final    BOTTLES DRAWN AEROBIC AND ANAEROBIC Blood Culture results may not be optimal due to an inadequate volume of blood received in culture bottles   Culture   Final    NO GROWTH 4 DAYS Performed at [facility], [address], [place], [place] [address]    Report Status PENDING  Incomplete  Blood culture (routine x 2)     Status: None (Preliminary result)   Collection Time: 01/02/21  4:05 PM   Specimen: BLOOD  Result Value Ref Range Status   Specimen Description BLOOD BLOOD LEFT FOREARM  Final   Special Requests   Final    BOTTLES DRAWN AEROBIC AND ANAEROBIC Blood Culture adequate volume   Culture   Final    NO GROWTH 4 DAYS Performed at [facility], [address], [place], [place]  [address]    Report Status PENDING  Incomplete  SARS CORONAVIRUS 2 (TAT 6-24 HRS) Nasopharyngeal Nasopharyngeal Swab     Status: None   Collection Time: 01/02/21  4:05 PM   Specimen: Nasopharyngeal Swab  Result Value Ref Range Status   SARS Coronavirus 2 NEGATIVE NEGATIVE Final    Comment: (NOTE) SARS-CoV-2 target nucleic acids are NOT DETECTED.  The SARS-CoV-2 RNA is generally detectable in upper and lower respiratory specimens during the acute phase of infection. Negative results do not preclude SARS-CoV-2 infection, do not rule out co-infections with  other pathogens, and should not be used as the sole basis for treatment or other patient management decisions. Negative results must be combined with clinical observations, patient history, and epidemiological information. The expected result is Negative.  Fact Sheet for Patients: HairSlick.no  Fact Sheet for Healthcare Providers: quierodirigir.com  This test is not yet approved or cleared by the [place] FDA and  has been authorized for detection and/or diagnosis of SARS-CoV-2 by FDA under an Emergency Use Authorization (EUA). This EUA will remain  in effect (meaning this test can be used) for the duration of the COVID-19 declaration under Se ction 564(b)(1) of the Act, 21 U.S.C. section 360bbb-3(b)(1), unless the authorization is terminated or revoked sooner.  Performed at [facility], [address], [place], [place] [address]          Radiology Studies: No results found.    Scheduled Meds: . aspirin EC  81 mg Oral Daily  . atorvastatin  40 mg Oral Daily  . famotidine  40 mg Oral Daily  . ferrous sulfate  325 mg Oral BID WC  . FLUoxetine  20 mg Oral Daily  . gabapentin  600 mg Oral QHS  . heparin  5,000 Units Subcutaneous Q8H  . insulin aspart  0-5 Units Subcutaneous QHS  . insulin aspart  0-9 Units Subcutaneous TID WC  . insulin detemir  20 Units  Subcutaneous BID  . ipratropium  2 puff Inhalation QID  . loratadine  10 mg Oral Daily  . nystatin   Topical BID  . pantoprazole  40 mg Oral BID  . polyethylene glycol  17 g Oral Daily   Continuous Infusions: . ampicillin-sulbactam (UNASYN) IV 3 g (01/06/21 0547)     LOS: 4 days     Osvaldo Shipper, MD [facility] Pager On [email or web address]  01/06/2021, 10:39 AM

## 2021-01-07 ENCOUNTER — Encounter
Admission: EM | Disposition: A | Discharge status: Home Health | Payer: Self-pay | Source: Home / Self Care | Attending: Internal Medicine

## 2021-01-07 ENCOUNTER — Inpatient Hospital Stay: Payer: Medicaid Other | Admitting: Anesthesiology

## 2021-01-07 ENCOUNTER — Encounter: Payer: Self-pay | Admitting: Internal Medicine

## 2021-01-07 DIAGNOSIS — L97313 Non-pressure chronic ulcer of right ankle with necrosis of muscle: Secondary | ICD-10-CM

## 2021-01-07 HISTORY — PX: APPLICATION OF WOUND VAC: SHX5189

## 2021-01-07 HISTORY — PX: INCISION AND DRAINAGE OF WOUND: SHX1803

## 2021-01-07 HISTORY — PX: GRAFT APPLICATION: SHX6696

## 2021-01-07 LAB — BASIC METABOLIC PANEL
Anion gap: 9 (ref 5–15)
BUN: 14 mg/dL (ref 6–20)
CO2: 23 mmol/L (ref 22–32)
Calcium: 8.2 mg/dL — ABNORMAL LOW (ref 8.9–10.3)
Chloride: 104 mmol/L (ref 98–111)
Creatinine, Ser: 1.36 mg/dL — ABNORMAL HIGH (ref 0.44–1.00)
GFR, Estimated: 46 mL/min — ABNORMAL LOW (ref >60)
Glucose, Bld: 315 mg/dL — ABNORMAL HIGH (ref 70–99)
Potassium: 4.4 mmol/L (ref 3.5–5.1)
Sodium: 136 mmol/L (ref 135–145)

## 2021-01-07 LAB — CBC
HCT: 26.6 % — ABNORMAL LOW (ref 36.0–46.0)
Hemoglobin: 8.2 g/dL — ABNORMAL LOW (ref 12.0–15.0)
MCH: 26.8 pg (ref 26.0–34.0)
MCHC: 30.8 g/dL (ref 30.0–36.0)
MCV: 86.9 fL (ref 80.0–100.0)
Platelets: 424 10*3/uL — ABNORMAL HIGH (ref 150–400)
RBC: 3.06 MIL/uL — ABNORMAL LOW (ref 3.87–5.11)
RDW: 16.9 % — ABNORMAL HIGH (ref 11.5–15.5)
WBC: 9.3 10*3/uL (ref 4.0–10.5)
nRBC: 0 % (ref 0.0–0.2)

## 2021-01-07 LAB — GLUCOSE, CAPILLARY
Glucose-Capillary: 297 mg/dL — ABNORMAL HIGH (ref 70–99)
Glucose-Capillary: 262 mg/dL — ABNORMAL HIGH (ref 70–99)
Glucose-Capillary: 203 mg/dL — ABNORMAL HIGH (ref 70–99)
Glucose-Capillary: 213 mg/dL — ABNORMAL HIGH (ref 70–99)
Glucose-Capillary: 242 mg/dL — ABNORMAL HIGH (ref 70–99)

## 2021-01-07 LAB — CULTURE, BLOOD (ROUTINE X 2)
Culture: NO GROWTH
Culture: NO GROWTH
Special Requests: ADEQUATE

## 2021-01-07 SURGERY — IRRIGATION AND DEBRIDEMENT WOUND
Anesthesia: General | Site: Ankle | Laterality: Right

## 2021-01-07 MED ORDER — PROPOFOL 10 MG/ML IV BOLUS
INTRAVENOUS | Status: DC | PRN
  Administered 2021-01-07: 150 mg via INTRAVENOUS

## 2021-01-07 MED ORDER — BUPIVACAINE HCL 0.5 % IJ SOLN
INTRAMUSCULAR | Status: DC | PRN
  Administered 2021-01-07: 20 mL

## 2021-01-07 MED ORDER — SUCCINYLCHOLINE CHLORIDE 20 MG/ML IJ SOLN
INTRAMUSCULAR | Status: DC | PRN
  Administered 2021-01-07: 120 mg via INTRAVENOUS

## 2021-01-07 MED ORDER — BUPIVACAINE HCL (PF) 0.5 % IJ SOLN
INTRAMUSCULAR | Status: AC
  Filled 2021-01-07: qty 30

## 2021-01-07 MED ORDER — ACETAMINOPHEN 325 MG PO TABS: 650.0000 mg | ORAL_TABLET | Freq: Four times a day (QID) | ORAL | Status: DC | PRN

## 2021-01-07 MED ORDER — FENTANYL CITRATE (PF) 100 MCG/2ML IJ SOLN
INTRAMUSCULAR | Status: DC | PRN
  Administered 2021-01-07 (×2): 50 ug via INTRAVENOUS

## 2021-01-07 MED ORDER — EPHEDRINE SULFATE 50 MG/ML IJ SOLN
INTRAMUSCULAR | Status: DC | PRN
  Administered 2021-01-07 (×2): 10 mg via INTRAVENOUS

## 2021-01-07 MED ORDER — OXYCODONE HCL 5 MG PO TABS
5.0000 mg | ORAL_TABLET | ORAL | Status: DC | PRN
  Administered 2021-01-07 – 2021-01-08 (×3): 10 mg via ORAL
  Filled 2021-01-07 (×3): qty 2

## 2021-01-07 MED ORDER — INSULIN ASPART 100 UNIT/ML ~~LOC~~ SOLN
SUBCUTANEOUS | Status: AC
  Administered 2021-01-07: 5 [IU]
  Filled 2021-01-07: qty 1

## 2021-01-07 MED ORDER — FENTANYL CITRATE (PF) 100 MCG/2ML IJ SOLN
25.0000 ug | Freq: Once | INTRAMUSCULAR | Status: AC
  Administered 2021-01-07: 25 ug via INTRAVENOUS
  Filled 2021-01-07: qty 2

## 2021-01-07 MED ORDER — PROPOFOL 10 MG/ML IV BOLUS
INTRAVENOUS | Status: AC
  Filled 2021-01-07: qty 20

## 2021-01-07 MED ORDER — SODIUM CHLORIDE 0.9 % IV SOLN: INTRAVENOUS | Status: DC | PRN

## 2021-01-07 MED ORDER — MIDAZOLAM HCL 2 MG/2ML IJ SOLN
INTRAMUSCULAR | Status: AC
  Filled 2021-01-07: qty 2

## 2021-01-07 MED ORDER — FENTANYL CITRATE (PF) 100 MCG/2ML IJ SOLN
25.0000 ug | INTRAMUSCULAR | Status: DC | PRN
  Administered 2021-01-07: 25 ug via INTRAVENOUS
  Filled 2021-01-07: qty 2

## 2021-01-07 MED ORDER — MIDAZOLAM HCL 2 MG/2ML IJ SOLN
INTRAMUSCULAR | Status: DC | PRN
  Administered 2021-01-07 (×2): 1 mg via INTRAVENOUS

## 2021-01-07 MED ORDER — ROCURONIUM BROMIDE 100 MG/10ML IV SOLN
INTRAVENOUS | Status: DC | PRN
  Administered 2021-01-07: 5 mg via INTRAVENOUS
  Administered 2021-01-07: 20 mg via INTRAVENOUS

## 2021-01-07 MED ORDER — SUGAMMADEX SODIUM 200 MG/2ML IV SOLN
INTRAVENOUS | Status: DC | PRN
  Administered 2021-01-07: 200 mg via INTRAVENOUS

## 2021-01-07 MED ORDER — INSULIN ASPART 100 UNIT/ML ~~LOC~~ SOLN
5.0000 [IU] | Freq: Once | SUBCUTANEOUS | Status: AC
  Administered 2021-01-07: 5 [IU] via SUBCUTANEOUS

## 2021-01-07 MED ORDER — FENTANYL CITRATE (PF) 100 MCG/2ML IJ SOLN
INTRAMUSCULAR | Status: AC
  Filled 2021-01-07: qty 2

## 2021-01-07 MED ORDER — VANCOMYCIN HCL 1000 MG IV SOLR
INTRAVENOUS | Status: DC | PRN
  Administered 2021-01-07: 1000 mg via TOPICAL

## 2021-01-07 MED ORDER — SODIUM CHLORIDE 0.9 % IV SOLN
INTRAVENOUS | Status: DC | PRN
  Administered 2021-01-07 – 2021-01-08 (×2): 250 mL via INTRAVENOUS

## 2021-01-07 MED ORDER — BUPIVACAINE LIPOSOME 1.3 % IJ SUSP
INTRAMUSCULAR | Status: AC
  Filled 2021-01-07: qty 20

## 2021-01-07 MED ORDER — SODIUM CHLORIDE 0.9 % IV SOLN
INTRAVENOUS | Status: DC | PRN
  Administered 2021-01-07: 3 g via INTRAVENOUS

## 2021-01-07 MED ORDER — ENOXAPARIN SODIUM 60 MG/0.6ML ~~LOC~~ SOLN
0.5000 mg/kg | SUBCUTANEOUS | Status: DC
  Administered 2021-01-07: 52.5 mg via SUBCUTANEOUS
  Filled 2021-01-07: qty 0.6

## 2021-01-07 MED ORDER — ONDANSETRON HCL 4 MG/2ML IJ SOLN
INTRAMUSCULAR | Status: DC | PRN
  Administered 2021-01-07: 4 mg via INTRAVENOUS

## 2021-01-07 SURGICAL SUPPLY — 64 items
"PENCIL ELECTRO HAND CTR " (MISCELLANEOUS) ×1 IMPLANT
BLADE OSC/SAGITTAL MD 5.5X18 (BLADE) IMPLANT
BLADE OSCILLATING/SAGITTAL (BLADE)
BLADE SURG 15 STRL LF DISP TIS (BLADE) ×1 IMPLANT
BLADE SURG 15 STRL SS (BLADE) ×2
BLADE SW THK.38XMED LNG THN (BLADE) IMPLANT
BNDG COHESIVE 4X5 TAN STRL (GAUZE/BANDAGES/DRESSINGS) ×2 IMPLANT
BNDG COHESIVE 6X5 TAN STRL LF (GAUZE/BANDAGES/DRESSINGS) ×2 IMPLANT
BNDG CONFORM 2 STRL LF (GAUZE/BANDAGES/DRESSINGS) ×2 IMPLANT
BNDG CONFORM 3 STRL LF (GAUZE/BANDAGES/DRESSINGS) ×2 IMPLANT
BNDG ELASTIC 4X5.8 VLCR STR LF (GAUZE/BANDAGES/DRESSINGS) ×2 IMPLANT
BNDG ESMARK 4X12 TAN STRL LF (GAUZE/BANDAGES/DRESSINGS) ×2 IMPLANT
BNDG GAUZE 4.5X4.1 6PLY STRL (MISCELLANEOUS) ×2 IMPLANT
CANISTER SUCT 1200ML W/VALVE (MISCELLANEOUS) ×2 IMPLANT
CANISTER WOUND CARE 500ML ATS (WOUND CARE) ×2 IMPLANT
COVER WAND RF STERILE (DRAPES) ×2 IMPLANT
CUFF TOURN SGL QUICK 12 (TOURNIQUET CUFF) IMPLANT
CUFF TOURN SGL QUICK 18X4 (TOURNIQUET CUFF) IMPLANT
DRAPE FLUOR MINI C-ARM 54X84 (DRAPES) IMPLANT
DRAPE XRAY CASSETTE 23X24 (DRAPES) IMPLANT
DURAPREP 26ML APPLICATOR (WOUND CARE) ×2 IMPLANT
ELECT REM PT RETURN 9FT ADLT (ELECTROSURGICAL) ×2 IMPLANT
ELECTRODE REM PT RTRN 9FT ADLT (ELECTROSURGICAL) ×1 IMPLANT
GAUZE PACKING 1/4 X5 YD (GAUZE/BANDAGES/DRESSINGS) ×2 IMPLANT
GAUZE PACKING IODOFORM 1X5 (PACKING) ×2 IMPLANT
GAUZE SPONGE 4X4 12PLY STRL (GAUZE/BANDAGES/DRESSINGS) ×2 IMPLANT
GAUZE XEROFORM 1X8 LF (GAUZE/BANDAGES/DRESSINGS) ×2 IMPLANT
GLOVE SURG ENC MOIS LTX SZ7 (GLOVE) ×4 IMPLANT
GOWN STRL REUS W/ TWL XL LVL3 (GOWN DISPOSABLE) ×2 IMPLANT
GOWN STRL REUS W/TWL MED LVL3 (GOWN DISPOSABLE) IMPLANT
GOWN STRL REUS W/TWL XL LVL3 (GOWN DISPOSABLE) ×4
HANDPIECE VERSAJET DEBRIDEMENT (MISCELLANEOUS) IMPLANT
IV NS 1000ML (IV SOLUTION) ×2
IV NS 1000ML BAXH (IV SOLUTION) ×1 IMPLANT
KIT DRSG VAC SLVR GRANUFM (MISCELLANEOUS) ×2 IMPLANT
KIT TURNOVER KIT A (KITS) ×2 IMPLANT
LABEL OR SOLS (LABEL) ×2 IMPLANT
MANIFOLD NEPTUNE II (INSTRUMENTS) ×2 IMPLANT
MATRIX TISSUE MESHED 4X5 (Tissue) ×1 IMPLANT
NDL FILTER BLUNT 18X1 1/2 (NEEDLE) ×1 IMPLANT
NDL HYPO 25X1 1.5 SAFETY (NEEDLE) ×1 IMPLANT
NEEDLE FILTER BLUNT 18X 1/2SAF (NEEDLE) ×1
NEEDLE FILTER BLUNT 18X1 1/2 (NEEDLE) ×1 IMPLANT
NEEDLE HYPO 25X1 1.5 SAFETY (NEEDLE) ×2 IMPLANT
NS IRRIG 500ML POUR BTL (IV SOLUTION) ×2 IMPLANT
PACK EXTREMITY ARMC (MISCELLANEOUS) ×2 IMPLANT
PAD ABD DERMACEA PRESS 5X9 (GAUZE/BANDAGES/DRESSINGS) ×2 IMPLANT
PENCIL ELECTRO HAND CTR (MISCELLANEOUS) ×2 IMPLANT
PULSAVAC PLUS IRRIG FAN TIP (DISPOSABLE) ×2 IMPLANT
RASP SM TEAR CROSS CUT (RASP) IMPLANT
SHIELD FULL FACE ANTIFOG 7M (MISCELLANEOUS) ×2 IMPLANT
STOCKINETTE IMPERVIOUS 9X36 MD (GAUZE/BANDAGES/DRESSINGS) ×2 IMPLANT
SUT ETHILON 2 0 FS 18 (SUTURE) ×4 IMPLANT
SUT ETHILON 4-0 (SUTURE) ×2
SUT ETHILON 4-0 FS2 18XMFL BLK (SUTURE) ×1 IMPLANT
SUT VIC AB 3-0 SH 27 (SUTURE) ×2
SUT VIC AB 3-0 SH 27X BRD (SUTURE) ×1 IMPLANT
SUT VIC AB 4-0 FS2 27 (SUTURE) ×2 IMPLANT
SUTURE ETHLN 4-0 FS2 18XMF BLK (SUTURE) ×1 IMPLANT
SWAB CULTURE AMIES ANAERIB BLU (MISCELLANEOUS) IMPLANT
SYR 10ML LL (SYRINGE) ×2 IMPLANT
SYR 3ML LL SCALE MARK (SYRINGE) ×2 IMPLANT
TIP FAN IRRIG PULSAVAC PLUS (DISPOSABLE) ×1 IMPLANT
UNDERPAD 30X36 HEAVY ABSORB (UNDERPADS AND DIAPERS) ×2 IMPLANT

## 2021-01-07 NOTE — Anesthesia Procedure Notes (Signed)
Procedure Name: Intubation Date/Time: 01/07/2021 10:32 AM Performed by: Milagros Reap, CRNA Pre-anesthesia Checklist: Patient identified, Emergency Drugs available, Suction available, Patient being monitored and Timeout performed Patient Re-evaluated:Patient Re-evaluated prior to induction Oxygen Delivery Method: Circle system utilized Preoxygenation: Pre-oxygenation with 100% oxygen Induction Type: IV induction Ventilation: Mask ventilation without difficulty and Oral airway inserted - appropriate to patient size Laryngoscope Size: McGraph and 3 Grade View: Grade I Tube type: Oral Tube size: 7.0 mm Number of attempts: 1 Airway Equipment and Method: Stylet and Video-laryngoscopy Placement Confirmation: positive ETCO2 and breath sounds checked- equal and bilateral Secured at: 20 (lip) cm Tube secured with: Tape Dental Injury: Teeth and Oropharynx as per pre-operative assessment

## 2021-01-07 NOTE — H&P (Signed)
History and Physical Interval Note:  01/07/2021 9:55 AM  Emily Collins  has presented today for surgery, with the diagnosis of chronic right ankle ulcer .  The various methods of treatment have been discussed with the patient and family. After consideration of risks, benefits and other options for treatment, the patient has consented to   Procedure(s) with comments: IRRIGATION AND DEBRIDEMENT WOUND (Right) - regional block if possible GRAFT APPLICATION (Right) APPLICATION OF WOUND VAC (Right) as a surgical intervention.  The patient's history has been reviewed, patient examined, no change in status, stable for surgery.  I have reviewed the patient's chart and labs.  Questions were answered to the patient's satisfaction.     Edwin Cap

## 2021-01-07 NOTE — Anesthesia Postprocedure Evaluation (Signed)
Anesthesia Post Note  Patient: Devina Bezold  Procedure(s) Performed: IRRIGATION AND DEBRIDEMENT WOUND (Right Ankle) GRAFT APPLICATION (Right Ankle) APPLICATION OF WOUND VAC (Right Ankle)  Patient location during evaluation: PACU Anesthesia Type: General Level of consciousness: awake and alert Pain management: pain level controlled Vital Signs Assessment: post-procedure vital signs reviewed and stable Respiratory status: spontaneous breathing, nonlabored ventilation and respiratory function stable Cardiovascular status: blood pressure returned to baseline and stable Postop Assessment: no apparent nausea or vomiting Anesthetic complications: no   No complications documented.   Last Vitals:  Vitals:   01/07/21 1200 01/07/21 1218  BP: (!) 127/55 (!) 117/51  Pulse: 74 70  Resp: 11 16  Temp:  36.8 C  SpO2: 94% 92%    Last Pain:  Vitals:   01/07/21 1218  TempSrc: Oral  PainSc:                  Karleen Hampshire

## 2021-01-07 NOTE — Progress Notes (Signed)
PROGRESS NOTE    Emily Collins  SJG:[id number] DOB: December 15, 1965 DOA: 01/02/2021 PCP: Oswaldo Conroy, MD  Brief Narrative:  55 y.o. female with medical history significant of DM-tI, HTN. HLD, asthma, GERD, depression with anxiety, former smoker, CKD-IIIa, anemia, diabetic foot ulcer with infection, presents with right foot ulcer and foot pain. Patient was recently hospitalized from 3/7-3/17 due to right foot ulcer, right ankle abscess and cellulitis.  Patient is s/p I&D, on VAC. Patient attributes pain to wound VAC being incorrectly placed at home.  Had her husband remove the wound VAC and now has wet-to-dry dressings on the area.  Podiatry was consulted.  Patient was hospitalized.    Assessment & Plan:   Wound infection of right ankle, cellulitis of right lower extremity Diabetic foot ulcer right lower extremity No evidence for sepsis on admission.  Patient underwent MRI of the right foot.  Podiatry consulted.  Patient started on IV Unasyn.   Plan was for the patient to have surgery on 4/13 but due to other emergency cases she could not be taken to the OR.   Patient to have surgery this morning.  Pain is reasonably well controlled.   Erythema has also improved.  WBC is better.  She is afebrile.  DM type 1 with diabetic peripheral neuropathy   Recent recent A1c 10.2, poorly controlled.  Patient is taking Trulicity, Humalog and Levemir Continue SSI.  Continue Levemir.   CBGs have been fluctuating.  Was given lower dose last night due to n.p.o. status.  CBGs noted to be elevated this morning.  We will go back up on her dose from this evening.    History of asthma Stable.  Depression with anxiety Continue home medications  CKD (chronic kidney disease), stage IIIa:  Based on previous labs her baseline creatinine seems to be around 1.5.  Seems to be stable.  Monitor urine output.  Normocytic anemia No evidence of overt bleeding.  Monitor closely.  Hemoglobin stable.  GERD  (gastroesophageal reflux disease) Stable.  Continue home medications.    Hyperlipidemia Continue Lipitor  Hyperkalemia Was given Lokelma with improvement potassium levels.    DVT prophylaxis: SQ heparin Code Status: Full Family Communication: Discussed with patient. Disposition Plan: Hopefully return home when improved  Status is: Inpatient  Remains inpatient appropriate because:Inpatient level of care appropriate due to severity of illness   Dispo: The patient is from: Home              Anticipated d/c is to: Home              Patient currently is not medically stable to d/c.   Difficult to place patient No   Consultants:   Podiatry  Procedures:   None  Antimicrobials:     Unasyn   Subjective: Pain is 5-6 out of 10 in intensity.  Denies any other complaints.  Overall feels better.  Had a bowel movement yesterday.    Objective: Vitals:   01/06/21 2032 01/06/21 2316 01/07/21 0526 01/07/21 0856  BP: (!) 146/61 129/60 (!) 131/55 (!) 143/60  Pulse: 79 75 79 72  Resp: 20 16 16 16   Temp: 98.8 F (37.1 C) 98.5 F (36.9 C) 98.2 F (36.8 C) 98.7 F (37.1 C)  TempSrc: Oral Oral Oral Temporal  SpO2: 95% 96% 94% 96%  Weight:    104.3 kg  Height:    5\' 2"  (1.575 m)    Intake/Output Summary (Last 24 hours) at 01/07/2021 1008 Last data filed at 01/07/2021  0106 Gross per 24 hour  Intake 1520.94 ml  Output --  Net 1520.94 ml   Filed Weights   01/02/21 1456 01/07/21 0856  Weight: 104.3 kg 104.3 kg    Examination:  General appearance: Awake alert.  In no distress Resp: Clear to auscultation bilaterally.  Normal effort Cardio: S1-S2 is normal regular.  No S3-S4.  No rubs murmurs or bruit GI: Abdomen is soft.  Nontender nondistended.  Bowel sounds are present normal.  No masses organomegaly Extremities: Right foot covered in dressing.  Erythema around the dressing site seems to be better. Neurologic: Alert and oriented x3.  No focal neurological deficits.        Data Reviewed: I have personally reviewed following labs and imaging studies  CBC: Recent Labs  Lab 01/02/21 1458 01/03/21 0510 01/06/21 0539 01/07/21 0530  WBC 19.7* 14.9* 10.2 9.3  NEUTROABS 15.3*  --   --   --   HGB 9.6* 8.7* 8.9* 8.2*  HCT 31.0* 27.8* 28.5* 26.6*  MCV 87.3 87.7 85.6 86.9  PLT 388 345 450* 424*   Basic Metabolic Panel: Recent Labs  Lab 01/03/21 0510 01/04/21 0800 01/05/21 0532 01/06/21 0539 01/07/21 0530  NA 132* 132* 135 137 136  K 5.1 4.4 4.6 4.6 4.4  CL 104 103 103 104 104  CO2 21* 20* 22 23 23   GLUCOSE 303* 109* 135* 264* 315*  BUN 27* 22* 21* 15 14  CREATININE 1.47* 1.45* 1.52* 1.35* 1.36*  CALCIUM 7.7* 7.7* 7.9* 8.3* 8.2*   GFR: Estimated Creatinine Clearance: 53 mL/min (A) (by C-G formula based on SCr of 1.36 mg/dL (H)).  Coagulation Profile: Recent Labs  Lab 01/02/21 1623  INR 1.1   CBG: Recent Labs  Lab 01/06/21 1558 01/06/21 1806 01/06/21 2033 01/07/21 0742 01/07/21 0959  GLUCAP 164* 221* 172* 297* 262*   Sepsis Labs: Recent Labs  Lab 01/02/21 1605 01/02/21 1726  LATICACIDVEN 1.3 1.0    Recent Results (from the past 240 hour(s))  Blood culture (routine x 2)     Status: None   Collection Time: 01/02/21  4:05 PM   Specimen: BLOOD  Result Value Ref Range Status   Specimen Description BLOOD BLOOD RIGHT ARM  Final   Special Requests   Final    BOTTLES DRAWN AEROBIC AND ANAEROBIC Blood Culture results may not be optimal due to an inadequate volume of blood received in culture bottles   Culture   Final    NO GROWTH 5 DAYS Performed at [facility], [address], [place], [place]    Report Status 01/07/2021 FINAL  Final  Blood culture (routine x 2)     Status: None   Collection Time: 01/02/21  4:05 PM   Specimen: BLOOD  Result Value Ref Range Status   Specimen Description BLOOD BLOOD LEFT FOREARM  Final   Special Requests   Final    BOTTLES DRAWN AEROBIC AND ANAEROBIC Blood Culture  adequate volume   Culture   Final    NO GROWTH 5 DAYS Performed at [facility], [address] [place], [place]    Report Status 01/07/2021 FINAL  Final  SARS CORONAVIRUS 2 (TAT 6-24 HRS) Nasopharyngeal Nasopharyngeal Swab     Status: None   Collection Time: 01/02/21  4:05 PM   Specimen: Nasopharyngeal Swab  Result Value Ref Range Status   SARS Coronavirus 2 NEGATIVE NEGATIVE Final    Comment: (NOTE) SARS-CoV-2 target nucleic acids are NOT DETECTED.  The SARS-CoV-2 RNA  is generally detectable in upper and lower respiratory specimens during the acute phase of infection. Negative results do not preclude SARS-CoV-2 infection, do not rule out co-infections with other pathogens, and should not be used as the sole basis for treatment or other patient management decisions. Negative results must be combined with clinical observations, patient history, and epidemiological information. The expected result is Negative.  Fact Sheet for Patients: HairSlick.no  Fact Sheet for Healthcare Providers: quierodirigir.com  This test is not yet approved or cleared by the [place] FDA and  has been authorized for detection and/or diagnosis of SARS-CoV-2 by FDA under an Emergency Use Authorization (EUA). This EUA will remain  in effect (meaning this test can be used) for the duration of the COVID-19 declaration under Se ction 564(b)(1) of the Act, 21 U.S.C. section 360bbb-3(b)(1), unless the authorization is terminated or revoked sooner.  Performed at [facility], [address] [place], [place] [address]          Radiology Studies: No results found.    Scheduled Meds: . [MAR Hold] aspirin EC  81 mg Oral Daily  . [MAR Hold] atorvastatin  40 mg Oral Daily  . [MAR Hold] famotidine  40 mg Oral Daily  . [MAR Hold] ferrous sulfate  325 mg Oral BID WC  . [MAR Hold] FLUoxetine  20 mg Oral Daily  . [MAR  Hold] gabapentin  600 mg Oral QHS  . [MAR Hold] heparin  5,000 Units Subcutaneous Q8H  . [MAR Hold] insulin aspart  0-5 Units Subcutaneous QHS  . [MAR Hold] insulin aspart  0-9 Units Subcutaneous TID WC  . [MAR Hold] insulin detemir  24 Units Subcutaneous BID  . [MAR Hold] ipratropium  2 puff Inhalation QID  . [MAR Hold] loratadine  10 mg Oral Daily  . [MAR Hold] nystatin   Topical BID  . [MAR Hold] pantoprazole  40 mg Oral BID  . [MAR Hold] polyethylene glycol  17 g Oral Daily   Continuous Infusions: . [MAR Hold] ampicillin-sulbactam (UNASYN) IV 3 g (01/07/21 0515)     LOS: 5 days     Osvaldo Shipper, MD [facility] Pager On [email or web address]  01/07/2021, 10:08 AM

## 2021-01-07 NOTE — Transfer of Care (Signed)
Immediate Anesthesia Transfer of Care Note  Patient: Emily Collins  Procedure(s) Performed: IRRIGATION AND DEBRIDEMENT WOUND (Right ) GRAFT APPLICATION (Right ) APPLICATION OF WOUND VAC (Right )  Patient Location: PACU  Anesthesia Type:General  Level of Consciousness: awake, alert  and oriented  Airway & Oxygen Therapy: Patient Spontanous Breathing  Post-op Assessment: Report given to RN and Post -op Vital signs reviewed and stable  Post vital signs: Reviewed and stable  Last Vitals:  Vitals Value Taken Time  BP 139/67 01/07/21 1142  Temp    Pulse 78 01/07/21 1142  Resp 13 01/07/21 1142  SpO2 100 % 01/07/21 1142    Last Pain:  Vitals:   01/07/21 0856  TempSrc: Temporal  PainSc: 0-No pain      Patients Stated Pain Goal: 0 (01/07/21 0856)  Complications: No complications documented.

## 2021-01-07 NOTE — Op Note (Signed)
Patient Name: Emily Collins DOB: April 30, 1966  MRN: [id number]   Date of Service: 01/02/2021 - 01/07/2021  Surgeon: Dr. 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 #2 uncontrolled diabetes mellitus with peripheral neuropathy  Procedures:  1) preparation of skin site total dimensions 48 cm right lower extremity ulcerations  2) application of skin substitute 4 x 5 Integra bilayer wound matrix  3) application of negative wound therapy Pathology/Specimens: ID Type Source Tests Collected by Time Destination  1 : RIGHT TIBIALIS ANTERIOR TENDON Tissue PATH Soft tissue SURGICAL PATHOLOGY Edwin Cap, DPM 01/07/2021 1054   A : RIGHT TIBIALIS ANTERIOR TENDON Tissue PATH Soft tissue AEROBIC/ANAEROBIC CULTURE W GRAM STAIN (SURGICAL/DEEP WOUND) Edwin Cap, DPM 01/07/2021 1054    Anesthesia: General Hemostasis:  Total Tourniquet Time Documented: Thigh (Right) - 30 minutes Total: Thigh (Right) - 30 minutes  Estimated Blood Loss: 10 cc Materials:  Implant Name Type Inv. Item Serial No. Manufacturer Lot No. LRB No. Used Action  INTEGRA MESHED BILAYER WOUND MATRIX [id number] [employer]  N/A [employer] [id number] Right 1 Implanted   Medications: 20 cc half percent Marcaine plain Complications: None  Indications for Procedure:  This is a 55 y.o. female with a history of recurrent right lower extremity infections with abscess development at previous underwent debridement and I&D.  She has had chronic right lower extremity ulcerations since that.  She was readmitted for cellulitis.   Procedure in Detail: Patient was identified in pre-operative holding area. Formal consent was signed and the right lower extremity was marked. Patient was brought back to the operating room.  Anesthesia was induced. The extremity was prepped and draped in the usual sterile fashion. Timeout was taken to confirm patient name, laterality, and procedure prior to incision.   Attention was then directed to the right lower extremity where the 2 anterior ankle wounds were inspected.  The tibialis anterior tendon was exposed in the medial wound and appeared significantly necrotic.  It had very little integrity.  Made the determination to excise the tendon.  I then debrided all wound beds using Hydro surgical device.  Once adequate excisional debridement of all nonviable tissue was completed and healthy granular wound bed remained for preparation irrigated the tissues, applied vancomycin powder and a 4 x 5 Integra wound bilayer matrix.  Total dimensions of the wounds were as follows: Medial ankle some 7.5 cm x 3.5 cm x 1 cm, lateral ankle 7 cm x 3 cm x 1 cm, posterior heel 1.5 cm by 0.5 cm x 0.5 cm.  This was secured to the wound bed using surgical staples and a negative pressure wound VAC which was applied using the adhesive strips.  Xeroform was applied over the posterior heel and 4 x 4's with an Ace wrap.  She was aroused from anesthesia. Patient tolerated the procedure well.  All counts were correct at the end the procedure   Disposition: Following a period of post-operative monitoring, patient will be transferred back to the inpatient unit.

## 2021-01-07 NOTE — Progress Notes (Signed)
Pharmacy Antibiotic Note  Emily Collins is a 55 y.o. female admitted on 01/02/2021 with cellulitis and diabetic foot infection.    Pharmacy has been consulted for Unasyn dosing.  Plan: Unasyn Day 6 Continue Unasyn 3g IV q6h  Height: 5\' 2"  (157.5 cm) Weight: 104.3 kg (229 lb 15 oz) IBW/kg (Calculated) : 50.1  Temp (24hrs), Avg:98.2 F (36.8 C), Min:96.7 F (35.9 C), Max:98.8 F (37.1 C)  Recent Labs  Lab 01/02/21 1458 01/02/21 1605 01/02/21 1726 01/03/21 0510 01/04/21 0800 01/05/21 0532 01/06/21 0539 01/07/21 0530  WBC 19.7*  --   --  14.9*  --   --  10.2 9.3  CREATININE 1.41*  --   --  1.47* 1.45* 1.52* 1.35* 1.36*  LATICACIDVEN  --  1.3 1.0  --   --   --   --   --     Estimated Creatinine Clearance: 53 mL/min (A) (by C-G formula based on SCr of 1.36 mg/dL (H)).    Allergies  Allergen Reactions  . Atorvastatin     Muscle and join pains.  01/09/21  [Levofloxacin In D5w]     GI upset  . Simvastatin     joint aches.  . Dilaudid  [Hydromorphone Hcl] Hives, Itching and Rash  . Tetracycline Rash    Antimicrobials this admission: Unasyn 4/10 >>  Vancomycin 4/10 >> 4/12  Dose adjustments this admission:  Microbiology results: BCx 4/10 NG final Wound Cx 4/15 pending  Thank you for allowing pharmacy to be a part of this patient's care.  5/15, PharmD, BCPS Clinical Pharmacist 01/07/2021 2:03 PM

## 2021-01-07 NOTE — Anesthesia Preprocedure Evaluation (Signed)
Anesthesia Evaluation  Patient identified by MRN, date of birth, ID band Patient awake    Reviewed: Allergy & Precautions, NPO status , Patient's Chart, lab work & pertinent test results  History of Anesthesia Complications Negative for: history of anesthetic complications  Airway Mallampati: IV  TM Distance: >3 FB Neck ROM: Full    Dental  (+) Poor Dentition   Pulmonary asthma , neg sleep apnea, former smoker,    breath sounds clear to auscultation- rhonchi (-) wheezing      Cardiovascular hypertension, Pt. on medications (-) CAD, (-) Past MI, (-) Cardiac Stents and (-) CABG  Rhythm:Regular Rate:Normal - Systolic murmurs and - Diastolic murmurs    Neuro/Psych neg Seizures PSYCHIATRIC DISORDERS Anxiety Depression negative neurological ROS     GI/Hepatic Neg liver ROS, GERD  ,  Endo/Other  diabetes, Type 1, Insulin Dependent  Renal/GU CRFRenal disease     Musculoskeletal  (+) Arthritis ,   Abdominal (+) + obese,   Peds  Hematology  (+) anemia ,   Anesthesia Other Findings Past Medical History: No date: Allergy No date: Anxiety No date: Arthritis No date: Asthma No date: Depression No date: Diabetes mellitus without complication (HCC) No date: GERD (gastroesophageal reflux disease) 04/07/2015: History of chicken pox     Comment:  DID have Chicken Pox.   No date: Hyperlipidemia No date: Hypertension No date: Neuromuscular disorder (HCC) No date: Venous (peripheral) insufficiency   Reproductive/Obstetrics                             Anesthesia Physical Anesthesia Plan  ASA: III  Anesthesia Plan: General   Post-op Pain Management:    Induction: Intravenous  PONV Risk Score and Plan: 2 and Ondansetron and Midazolam  Airway Management Planned: LMA  Additional Equipment:   Intra-op Plan:   Post-operative Plan:   Informed Consent: I have reviewed the patients History and  Physical, chart, labs and discussed the procedure including the risks, benefits and alternatives for the proposed anesthesia with the patient or authorized representative who has indicated his/her understanding and acceptance.     Dental advisory given  Plan Discussed with: CRNA and Anesthesiologist  Anesthesia Plan Comments:         Anesthesia Quick Evaluation

## 2021-01-07 NOTE — Brief Op Note (Signed)
01/07/2021  11:36 AM  PATIENT:  Emily Collins  55 y.o. 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, chronic right ankle wound  PROCEDURE:  Procedure(s) with comments: IRRIGATION AND DEBRIDEMENT WOUND (Right) - regional block if possible GRAFT APPLICATION (Right) APPLICATION OF WOUND VAC (Right)  SURGEON:  Surgeon(s) and Role:    * Gerilynn Mccullars, Rachelle Hora, DPM - Primary    ASSISTANTS: none   ANESTHESIA:   general  EBL: 10 cc   BLOOD ADMINISTERED:none  DRAINS: none   LOCAL MEDICATIONS USED:  MARCAINE   20cc  SPECIMEN:  Source of Specimen:  Tibialis anterior tendon  DISPOSITION OF SPECIMEN: Pathology and microbiology  COUNTS:  YES  TOURNIQUET:   Total Tourniquet Time Documented: Thigh (Right) - 30 minutes Total: Thigh (Right) - 30 minutes   DICTATION: .Note written in EPIC  PLAN OF CARE: Admit to inpatient   PATIENT DISPOSITION:  PACU - hemodynamically stable.   Delay start of Pharmacological VTE agent (>24hrs) due to surgical blood loss or risk of bleeding: No

## 2021-01-07 NOTE — Plan of Care (Signed)
Continuing with plan of care. 

## 2021-01-08 LAB — GLUCOSE, CAPILLARY: Glucose-Capillary: 340 mg/dL — ABNORMAL HIGH (ref 70–99)

## 2021-01-08 LAB — CBC
HCT: 25 % — ABNORMAL LOW (ref 36.0–46.0)
Hemoglobin: 7.9 g/dL — ABNORMAL LOW (ref 12.0–15.0)
MCH: 27.9 pg (ref 26.0–34.0)
MCHC: 31.6 g/dL (ref 30.0–36.0)
MCV: 88.3 fL (ref 80.0–100.0)
Platelets: 372 10*3/uL (ref 150–400)
RBC: 2.83 MIL/uL — ABNORMAL LOW (ref 3.87–5.11)
RDW: 17.1 % — ABNORMAL HIGH (ref 11.5–15.5)
WBC: 10.9 10*3/uL — ABNORMAL HIGH (ref 4.0–10.5)
nRBC: 0 % (ref 0.0–0.2)

## 2021-01-08 LAB — BASIC METABOLIC PANEL
Anion gap: 7 (ref 5–15)
BUN: 13 mg/dL (ref 6–20)
CO2: 25 mmol/L (ref 22–32)
Calcium: 7.7 mg/dL — ABNORMAL LOW (ref 8.9–10.3)
Chloride: 103 mmol/L (ref 98–111)
Creatinine, Ser: 1.33 mg/dL — ABNORMAL HIGH (ref 0.44–1.00)
GFR, Estimated: 47 mL/min — ABNORMAL LOW (ref >60)
Glucose, Bld: 358 mg/dL — ABNORMAL HIGH (ref 70–99)
Potassium: 5 mmol/L (ref 3.5–5.1)
Sodium: 135 mmol/L (ref 135–145)

## 2021-01-08 MED ORDER — POLYETHYLENE GLYCOL 3350 17 G PO PACK: 17.0000 g | PACK | Freq: Every day | ORAL | 0 refills | Status: DC

## 2021-01-08 MED ORDER — OXYCODONE-ACETAMINOPHEN 5-325 MG PO TABS: 1.0000 | ORAL_TABLET | ORAL | 0 refills | Status: DC | PRN

## 2021-01-08 MED ORDER — GABAPENTIN 300 MG PO CAPS: 600.0000 mg | ORAL_CAPSULE | Freq: Three times a day (TID) | ORAL | 0 refills | Status: DC

## 2021-01-08 NOTE — Plan of Care (Signed)
Continuing with plan of care. 

## 2021-01-08 NOTE — Discharge Summary (Signed)
[facility]  Physician Discharge Summary   Patient ID: Emily Collins MRN: [id number] DOB/AGE: 1965-09-29 55 y.o.  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 Outpatient follow-up with podiatry 2. Patient now has a wound VAC   Home Health: RN Equipment/Devices: Wound VAC  CODE STATUS: Full code  DISCHARGE CONDITION: fair  Diet recommendation: Modified carbohydrate  INITIAL HISTORY: 55 y.o.femalewith medical history significant ofDM-tI, HTN. HLD, asthma, GERD, depression with anxiety, former smoker, CKD-IIIa, anemia,diabetic foot ulcer with infection,presents with right foot ulcer and foot pain. Patient was recently hospitalized from 3/7-3/17 due to right foot ulcer,right ankle abscess and cellulitis. Patient is s/p I&D, on VAC. Patient attributes pain to wound VAC being incorrectly placed at home.  Had her husband remove the wound VAC and now has wet-to-dry dressings on the area.  Podiatry was consulted.  Patient was hospitalized.   Consultations:  Podiatry  Procedures:  Irrigation debridement right foot   HOSPITAL COURSE:   Wound infection of right ankle, cellulitis of right lower extremity Diabetic foot ulcer right lower extremity No evidence for sepsis on admission.  Patient underwent MRI of the right foot.  Podiatry consulted.  Patient was started on IV Unasyn.   Patient underwent irrigation and debridement foot on 4/15.  Pain is reasonably well controlled.  She currently has a wound VAC.    Seen by podiatry.  Apparently they had recently prescribed her a course of Bactrim which is what they recommend that she  take at home.  They have cleared her for discharge today.  Noted to be afebrile.  DM type 1 with diabetic peripheral neuropathy  Recent recent A1c 10.2, poorly controlled.Patient is taking Trulicity, Humalog and Levemir.  She may resume her home medications.  High glucose level noted this morning.  She will check her glucose levels at home after insulin has been administered.  History of asthma Stable.  Depression with anxiety Continue home medications  CKD (chronic kidney disease), stage IIIa:  Based on previous labs her baseline creatinine seems to be around 1.5.  Seems to be stable.   Normocytic anemia Slight drop in hemoglobin likely due to recent surgery and acute illness.  No other overt bleeding noted.  GERD (gastroesophageal reflux disease) Stable.  Continue home medications.    Hyperlipidemia Continue Lipitor  Hyperkalemia Was given Lokelma with improvement in potassium levels.     Obesity Estimated body mass index is 42.06 kg/m as calculated from the following:   Height as of this encounter: 5\' 2"  (1.575 m).   Weight as of this encounter: 104.3 kg.   Cleared by podiatry for discharge.  Okay for discharge today.    PERTINENT LABS:  The results of significant diagnostics from this hospitalization (including imaging, microbiology, ancillary and laboratory) are listed below for reference.    Microbiology: Recent Results (from the past 240 hour(s))  Blood culture (routine x 2)     Status: None   Collection Time: 01/02/21  4:05 PM   Specimen: BLOOD  Result Value Ref Range Status   Specimen Description BLOOD BLOOD RIGHT ARM  Final   Special Requests   Final    BOTTLES DRAWN AEROBIC AND ANAEROBIC Blood Culture results may not be optimal due to  an inadequate volume of blood received in culture bottles   Culture   Final    NO GROWTH 5 DAYS Performed at [facility], [address] [place] [place], [place] [address]    Report Status 01/07/2021  FINAL  Final  Blood culture (routine x 2)     Status: None   Collection Time: 01/02/21  4:05 PM   Specimen: BLOOD  Result Value Ref Range Status   Specimen Description BLOOD BLOOD LEFT FOREARM  Final   Special Requests   Final    BOTTLES DRAWN AEROBIC AND ANAEROBIC Blood Culture adequate volume   Culture   Final    NO GROWTH 5 DAYS Performed at [facility], [address], [place], [place] [address]    Report Status 01/07/2021 FINAL  Final  SARS CORONAVIRUS 2 (TAT 6-24 HRS) Nasopharyngeal Nasopharyngeal Swab     Status: None   Collection Time: 01/02/21  4:05 PM   Specimen: Nasopharyngeal Swab  Result Value Ref Range Status   SARS Coronavirus 2 NEGATIVE NEGATIVE Final    Comment: (NOTE) SARS-CoV-2 target nucleic acids are NOT DETECTED.  The SARS-CoV-2 RNA is generally detectable in upper and lower respiratory specimens during the acute phase of infection. Negative results do not preclude SARS-CoV-2 infection, do not rule out co-infections with other pathogens, and should not be used as the sole basis for treatment or other patient management decisions. Negative results must be combined with clinical observations, patient history, and epidemiological information. The expected result is Negative.  Fact Sheet for Patients: HairSlick.no  Fact Sheet for Healthcare Providers: quierodirigir.com  This test is not yet approved or cleared by the [place] FDA and  has been authorized for detection and/or diagnosis of SARS-CoV-2 by FDA under an Emergency Use Authorization (EUA). This EUA will remain  in effect (meaning this test can be used) for the duration of the COVID-19 declaration under Se ction 564(b)(1) of the Act, 21 U.S.C. section 360bbb-3(b)(1), unless the authorization is terminated or revoked sooner.  Performed at [facility], [address] [place], [place] [address]   Aerobic/Anaerobic Culture  w Gram Stain (surgical/deep wound)     Status: None (Preliminary result)   Collection Time: 01/07/21 11:20 AM   Specimen: PATH Soft tissue  Result Value Ref Range Status   Specimen Description   Final    TISSUE Performed at [facility], [address], [place], [place] [address]    Special Requests   Final    NONE Performed at [facility], [address] [place], [place] [address]    Gram Stain NO WBC SEEN NO ORGANISMS SEEN   Final   Culture   Final    RARE GROUP B STREP(S.AGALACTIAE)ISOLATED TESTING AGAINST S. AGALACTIAE NOT ROUTINELY PERFORMED DUE TO PREDICTABILITY OF AMP/PEN/VAN SUSCEPTIBILITY. Performed at [facility], [address], [place], [place] [address]    Report Status PENDING  Incomplete     Labs:  COVID-19 Labs    Lab Results  Component Value Date   SARSCOV2NAA NEGATIVE 01/02/2021   SARSCOV2NAA NEGATIVE 11/29/2020   SARSCOV2NAA NEGATIVE 04/26/2019      Basic Metabolic Panel: Recent Labs  Lab 01/04/21 0800 01/05/21 0532 01/06/21 0539 01/07/21 0530 01/08/21 0620  NA 132* 135 137 136 135  K 4.4 4.6 4.6 4.4 5.0  CL 103 103 104 104 103  CO2 20* 22 23 23 25   GLUCOSE 109* 135* 264* 315* 358*  BUN 22* 21* 15 14 13  CREATININE 1.45* 1.52* 1.35* 1.36* 1.33*  CALCIUM 7.7* 7.9* 8.3* 8.2* 7.7*   CBC: Recent Labs  Lab 01/02/21 1458 01/03/21 0510 01/06/21 0539 01/07/21 0530 01/08/21 0620  WBC 19.7* 14.9* 10.2 9.3 10.9*  NEUTROABS 15.3*  --   --   --   --   HGB 9.6* 8.7* 8.9* 8.2* 7.9*  HCT 31.0* 27.8* 28.5* 26.6* 25.0*  MCV 87.3 87.7 85.6 86.9 88.3  PLT 388 345 450* 424* 372    CBG: Recent Labs  Lab 01/07/21 0959 01/07/21 1142 01/07/21 1703 01/07/21 2115 01/08/21 0808  GLUCAP 262* 203* 213* 242* 340*     IMAGING STUDIES MR ANKLE RIGHT W WO CONTRAST  Result Date: 01/03/2021 CLINICAL DATA:  Diabetic foot and ankle wounds. History of incision and drainage on 12/01/2020 EXAM: MRI OF THE RIGHT ANKLE WITHOUT  AND WITH CONTRAST TECHNIQUE: Multiplanar, multisequence MR imaging of the ankle was performed before and after the administration of intravenous contrast. CONTRAST:  10mL GADAVIST GADOBUTROL 1 MMOL/ML IV SOLN COMPARISON:  X-ray 11/29/2020, MRI 11/29/2020 FINDINGS: Technical note: Motion degraded exam. TENDONS Peroneal: Short segment split tear of the infra malleolar peroneus brevis tendon. Intact peroneus longus. No tenosynovitis. Posteromedial: Intact tibialis posterior, flexor hallucis longus and flexor digitorum longus tendons. Anterior: Tibialis anterior tendon closely approximates the base of surgical incision site at the anteromedial aspect of the ankle. Anterior ankle tendons are intact. No significant tenosynovial fluid collection. Achilles: Intact. Plantar Fascia: Intact. LIGAMENTS Lateral: Grossly intact. Medial: Grossly intact. CARTILAGE Ankle Joint: No focal cartilage defect. No tibiotalar joint effusion. Subtalar Joints/Sinus Tarsi: No joint effusion or chondral defect. Preservation of the anatomic fat within the sinus tarsi. Bones: No acute fracture or malalignment. No bony erosion or cortical destruction. No bone marrow edema. Preservation of the fatty T1 bone marrow signal throughout the ankle and visualized foot. Other: Surgical incision sites at the anteromedial and anterolateral aspects of the ankle with transversely oriented peripherally enhancing tract extending between the incision sites (series 10, image 18), which is likely postsurgical. No residual fluid collection at the anterior aspect of the ankle. No new fluid collections. Small superficial ulceration overlies the posteromedial aspect of the calcaneus without organized fluid collection or abscess. IMPRESSION: IMPRESSION 1. Interval postsurgical changes from incision and drainage at the anterior aspect of the ankle. No residual fluid collection or abscess. 2. Small superficial ulceration overlies the posteromedial aspect of the calcaneus  without organized fluid collection or abscess. 3. Negative for acute osteomyelitis of the right ankle. Electronically Signed   By: Duanne Guess D.O.   On: 01/03/2021 08:17    DISCHARGE EXAMINATION: Vitals:   01/07/21 1948 01/07/21 2316 01/08/21 0330 01/08/21 0825  BP: 131/73 (!) 120/54 (!) 116/57 (!) 128/49  Pulse: 78 73 74 71  Resp: (!) Temp: 98.2 F (36.8 C) (!) 97.5 F (36.4 C) 97.9 F (36.6 C) 98.7 F (37.1 C)  TempSrc:  Oral Oral Oral  SpO2: 98% 94% 96% 97%  Weight:      Height:       General appearance: Awake alert.  In no distress Resp: Clear to auscultation bilaterally.  Normal effort Cardio: S1-S2 is normal regular.  No S3-S4.  No rubs murmurs or bruit GI: Abdomen is soft.  Nontender nondistended.  Bowel sounds are present normal.  No masses organomegaly Extremities: Has a wound VAC on her right foot   DISPOSITION: Home with husband  Discharge Instructions    Call MD for:  difficulty breathing,  headache or visual disturbances   Complete by: As directed    Call MD for:  extreme fatigue   Complete by: As directed    Call MD for:  persistant dizziness or light-headedness   Complete by: As directed    Call MD for:  persistant nausea and vomiting   Complete by: As directed    Call MD for:  severe uncontrolled pain   Complete by: As directed    Call MD for:  temperature >100.4   Complete by: As directed    Diet - low sodium heart healthy   Complete by: As directed    Diet Carb Modified   Complete by: As directed    Discharge instructions   Complete by: As directed    Please take the Bactrim as was instructed by your podiatrist.  Please call Dr. Vara Guardian office on Monday to schedule an appointment.  You were cared for by a hospitalist during your hospital stay. If you have any questions about your discharge medications or the care you received while you were in the hospital after you are discharged, you can call the unit and asked to speak with the  hospitalist on call if the hospitalist that took care of you is not available. Once you are discharged, your primary care physician will handle any further medical issues. Please note that NO REFILLS for any discharge medications will be authorized once you are discharged, as it is imperative that you return to your primary care physician (or establish a relationship with a primary care physician if you do not have one) for your aftercare needs so that they can reassess your need for medications and monitor your lab values. If you do not have a primary care physician, you can call [phone] for a physician referral.   Discharge wound care:   Complete by: As directed    Continue wound VAC as instructed by the podiatrist.   Increase activity slowly   Complete by: As directed         Allergies as of 01/08/2021      Reactions   Atorvastatin    Muscle and join pains.   Levaquin  [levofloxacin In D5w]    GI upset   Simvastatin    joint aches.   Dilaudid  [hydromorphone Hcl] Hives, Itching, Rash   Tetracycline Rash      Medication List    STOP taking these medications   montelukast 10 MG tablet Commonly known as: SINGULAIR   mupirocin ointment 2 % Commonly known as: BACTROBAN     TAKE these medications   amitriptyline 25 MG tablet Commonly known as: ELAVIL Take 25 mg by mouth at bedtime as needed (neuropathy pain).   aspirin 81 MG tablet Take 81 mg by mouth daily.   atorvastatin 40 MG tablet Commonly known as: LIPITOR Take 40 mg by mouth daily.   Atrovent HFA 17 MCG/ACT inhaler Generic drug: ipratropium Inhale 2 puffs into the lungs 4 (four) times daily.   cetirizine 10 MG tablet Commonly known as: ZYRTEC TAKE 1 TABLET(10 MG) BY MOUTH DAILY What changed: See the new instructions.   cyclobenzaprine 5 MG tablet Commonly known as: FLEXERIL Take 5 mg by mouth at bedtime as needed for muscle spasms.   famotidine 40 MG tablet Commonly known as: PEPCID Take 40 mg by mouth  daily.   ferrous sulfate 325 (65 FE) MG tablet Take 325 mg by mouth 2 (two) times daily with a meal.   FLUoxetine 20 MG capsule  Commonly known as: PROZAC Take 1 capsule (20 mg total) by mouth daily. What changed: how much to take   gabapentin 300 MG capsule Commonly known as: NEURONTIN Take 2 capsules (600 mg total) by mouth 3 (three) times daily. What changed: when to take this   HumaLOG KwikPen 100 UNIT/ML KwikPen Generic drug: insulin lispro Inject 5 Units into the skin 3 (three) times daily with meals. INJ UP TO 100 UNI [place] PER DAY AS PER MD INSTRUCTIONS   hydrOXYzine 10 MG tablet Commonly known as: ATARAX/VISTARIL Take 10 mg by mouth 3 (three) times daily as needed for itching or anxiety.   Levemir FlexTouch 100 UNIT/ML FlexPen Generic drug: insulin detemir Inject 40 Units into the skin daily. What changed:   how much to take  when to take this   Melatonin 10 MG Tabs Take 10 mg by mouth at bedtime as needed (sleep).   meloxicam 15 MG tablet Commonly known as: MOBIC Take 15 mg by mouth daily.   omeprazole 40 MG capsule Commonly known as: PRILOSEC Take 40 mg by mouth 2 (two) times daily.   oxyCODONE-acetaminophen 5-325 MG tablet Commonly known as: PERCOCET/ROXICET Take 1-2 tablets by mouth every 4 (four) hours as needed for severe pain.   polyethylene glycol 17 g packet Commonly known as: MIRALAX / GLYCOLAX Take 17 g by mouth daily.   Trulicity 3 MG/0.5ML Sopn Generic drug: Dulaglutide Inject 3 mg into the skin every Saturday.            Discharge Care Instructions  (From admission, onward)         Start     Ordered   01/08/21 0000  Discharge wound care:       Comments: Continue wound VAC as instructed by the podiatrist.   01/08/21 1027            Follow-up Information    McDonald, Rachelle HoraAdam R, DPM. Schedule an appointment as soon as possible for a visit.   Specialty: Podiatry Why: please call on 4/18 to schedule appoinment Contact  information: [address] [place] [id number] [phone]               TOTAL DISCHARGE TIME: 35 minutes  Soley Harriss Rito EhrlichKrishnan  [facility] Pager on [email or web address]  01/09/2021, 11:51 AM

## 2021-01-08 NOTE — TOC Transition Note (Signed)
Transition of Care [facility]) - CM/SW Discharge Note   Patient Details  Name: Emily Collins MRN: [id number] Date of Birth: [birth year]  Transition of [facility] CM/SW Contact:  Magnus Ivan, LCSW Phone Number: 01/08/2021, 11:34 AM   Clinical Narrative:   Patient to discharge home today. CSW met with patient at bedside. Patient lives with family. Spouse provides transportation. PCP is Dr. Rebeca Alert. Pharmacy is [facility]. Patient has a home wound vac which is at bedside. Patient reported she is active with [facility] for RN services. CSW left VM for Adriane with [facility] to inform her of patient discharging and asked MD for [facility] orders. Patient denied additional TOC needs prior to discharge.    Final next level of care: [place]     Patient Goals and CMS Choice Patient states their goals for this hospitalization and ongoing recovery are:: home with home health CMS Medicare.gov Compare Post Acute Care list provided to:: Patient Choice offered to / list presented to : Patient  Discharge Placement                Patient to be transferred to facility by: spouse will transport home Name of family member notified: patient Patient and family notified of of transfer: 01/08/21  Discharge Plan and Services                                     Social Determinants of Health ([place]) Interventions     Readmission Risk Interventions No flowsheet data found.

## 2021-01-08 NOTE — Progress Notes (Addendum)
Subjective: POD #1 s/p wound debridement, graft application with negative pressure wound therapy.  Overnight she was having pain but upon seeing her this morning she states her pain is much improved as her gabapentin was increased to 3 times a day and encompass with pain medicine her pain is controlled at this time.  She states that she is ready to go home. Denies any systemic complaints such as fevers, chills, nausea, vomiting. No acute changes since last appointment, and no other complaints at this time.   Objective: AAO x3, NAD Dressing clean, dry, intact with any strikethrough.  Negative pressure wound therapy intact with minimal output in the canister.  Negative pressure wound therapy is functioning at 120 mmHg.  No pain with calf compression, swelling, warmth, erythema  Assessment: POD #1 s/p right wound debridement, graft application with negative pressure wound therapy  Plan: Pain is controlled currently.  I discussed the case with Dr. Lilian Kapur as well.  As her pain is controlled she is able to be discharged today.  She has Bactrim that was prescribed by Dr. Lilian Kapur previously that she is can start on.She has an appointment scheduled to follow-up with Dr. Lilian Kapur on Wednesday for the first dressing change. Return precautions advised.  Ovid Curd, DPM

## 2021-01-08 NOTE — Progress Notes (Addendum)
PROGRESS NOTE    Emily Collins  WNI:[id number] DOB: Jan 27, 1966 DOA: 01/02/2021 PCP: Oswaldo Conroy, MD  Brief Narrative:  55 y.o. female with medical history significant of DM-tI, HTN. HLD, asthma, GERD, depression with anxiety, former smoker, CKD-IIIa, anemia, diabetic foot ulcer with infection, presents with right foot ulcer and foot pain. Patient was recently hospitalized from 3/7-3/17 due to right foot ulcer, right ankle abscess and cellulitis.  Patient is s/p I&D, on VAC. Patient attributes pain to wound VAC being incorrectly placed at home.  Had her husband remove the wound VAC and now has wet-to-dry dressings on the area.  Podiatry was consulted.  Patient was hospitalized.    Assessment & Plan:   Wound infection of right ankle, cellulitis of right lower extremity Diabetic foot ulcer right lower extremity No evidence for sepsis on admission.  Patient underwent MRI of the right foot.  Podiatry consulted.  Patient was started on IV Unasyn.   Patient underwent irrigation and debridement foot on 4/15.  Pain is reasonably well controlled.  She currently has a wound VAC.  Further management per podiatry.  Pain control.  Hopefully we can transition her to oral antibiotics soon.  He is afebrile.  WBC noted to be 10.9 today.  ADDENDUM Patient seen by Dr. Ardelle Anton with podiatry this morning.  He messaged me saying that the patient was stable for discharge.  Patient apparently has a prescription for Bactrim which she can take at home.  He does recommend increasing the dose of her gabapentin to 3 times a day which we will do.  We will send prescription for oxycodone/acetaminophen as well.  Wound VAC to continue.  Further instructions per podiatry.  Will discharge patient today.  DM type 1 with diabetic peripheral neuropathy   Recent recent A1c 10.2, poorly controlled.  Patient is taking Trulicity, Humalog and Levemir CBGs noted to be poorly controlled.  We will go up on the dose of Levemir  today.  Remains on gabapentin.  History of asthma Stable.  Depression with anxiety Continue home medications  CKD (chronic kidney disease), stage IIIa:  Based on previous labs her baseline creatinine seems to be around 1.5.  Seems to be stable.  Monitor urine output.  Normocytic anemia Slight drop in hemoglobin noted.  No overt bleeding noted.  May have had some loss during surgery yesterday.  Recheck hemoglobin tomorrow.  GERD (gastroesophageal reflux disease) Stable.  Continue home medications.    Hyperlipidemia Continue Lipitor  Hyperkalemia Was given Lokelma with improvement potassium levels.    DVT prophylaxis: SQ heparin Code Status: Full Family Communication: Discussed with patient. Disposition Plan: Hopefully return home when improved  Status is: Inpatient  Remains inpatient appropriate because:Inpatient level of care appropriate due to severity of illness   Dispo: The patient is from: Home              Anticipated d/c is to: Home              Patient currently is not medically stable to d/c.   Difficult to place patient No   Consultants:   Podiatry  Procedures:   None  Antimicrobials:     Unasyn   Subjective: Pain is 5-10 in intensity.  No other complaints offered.  Having regular bowel movements.    Objective: Vitals:   01/07/21 1948 01/07/21 2316 01/08/21 0330 01/08/21 0825  BP: 131/73 (!) 120/54 (!) 116/57 (!) 128/49  Pulse: 78 73 74 71  Resp: (!) 22 16 18  Temp: 98.2 F (36.8 C) (!) 97.5 F (36.4 C) 97.9 F (36.6 C) 98.7 F (37.1 C)  TempSrc:  Oral Oral Oral  SpO2: 98% 94% 96% 97%  Weight:      Height:        Intake/Output Summary (Last 24 hours) at 01/08/2021 0936 Last data filed at 01/08/2021 0014 Gross per 24 hour  Intake 1403.32 ml  Output --  Net 1403.32 ml   Filed Weights   01/02/21 1456 01/07/21 0856  Weight: 104.3 kg 104.3 kg    Examination:  General appearance: Awake alert.  In no distress Resp: Clear  to auscultation bilaterally.  Normal effort Cardio: S1-S2 is normal regular.  No S3-S4.  No rubs murmurs or bruit GI: Abdomen is soft.  Nontender nondistended.  Bowel sounds are present normal.  No masses organomegaly Extremities: Right foot covered in dressing with a wound VAC in place. Neurologic: Alert and oriented x3.  No focal neurological deficits.       Data Reviewed: I have personally reviewed following labs and imaging studies  CBC: Recent Labs  Lab 01/02/21 1458 01/03/21 0510 01/06/21 0539 01/07/21 0530 01/08/21 0620  WBC 19.7* 14.9* 10.2 9.3 10.9*  NEUTROABS 15.3*  --   --   --   --   HGB 9.6* 8.7* 8.9* 8.2* 7.9*  HCT 31.0* 27.8* 28.5* 26.6* 25.0*  MCV 87.3 87.7 85.6 86.9 88.3  PLT 388 345 450* 424* 372   Basic Metabolic Panel: Recent Labs  Lab 01/04/21 0800 01/05/21 0532 01/06/21 0539 01/07/21 0530 01/08/21 0620  NA 132* 135 137 136 135  K 4.4 4.6 4.6 4.4 5.0  CL 103 103 104 104 103  CO2 20* 22 23 23 25   GLUCOSE 109* 135* 264* 315* 358*  BUN 22* 21* 15 14 13   CREATININE 1.45* 1.52* 1.35* 1.36* 1.33*  CALCIUM 7.7* 7.9* 8.3* 8.2* 7.7*   GFR: Estimated Creatinine Clearance: 54.2 mL/min (A) (by C-G formula based on SCr of 1.33 mg/dL (H)).  Coagulation Profile: Recent Labs  Lab 01/02/21 1623  INR 1.1   CBG: Recent Labs  Lab 01/07/21 0959 01/07/21 1142 01/07/21 1703 01/07/21 2115 01/08/21 0808  GLUCAP 262* 203* 213* 242* 340*   Sepsis Labs: Recent Labs  Lab 01/02/21 1605 01/02/21 1726  LATICACIDVEN 1.3 1.0    Recent Results (from the past 240 hour(s))  Blood culture (routine x 2)     Status: None   Collection Time: 01/02/21  4:05 PM   Specimen: BLOOD  Result Value Ref Range Status   Specimen Description BLOOD BLOOD RIGHT ARM  Final   Special Requests   Final    BOTTLES DRAWN AEROBIC AND ANAEROBIC Blood Culture results may not be optimal due to an inadequate volume of blood received in culture bottles   Culture   Final    NO GROWTH  5 DAYS Performed at [facility], [address], [place], [address] [place]    Report Status 01/07/2021 FINAL  Final  Blood culture (routine x 2)     Status: None   Collection Time: 01/02/21  4:05 PM   Specimen: BLOOD  Result Value Ref Range Status   Specimen Description BLOOD BLOOD LEFT FOREARM  Final   Special Requests   Final    BOTTLES DRAWN AEROBIC AND ANAEROBIC Blood Culture adequate volume   Culture   Final    NO GROWTH 5 DAYS Performed at [facility], [address] [place], [address] [place]    Report  Status 01/07/2021 FINAL  Final  SARS CORONAVIRUS 2 (TAT 6-24 HRS) Nasopharyngeal Nasopharyngeal Swab     Status: None   Collection Time: 01/02/21  4:05 PM   Specimen: Nasopharyngeal Swab  Result Value Ref Range Status   SARS Coronavirus 2 NEGATIVE NEGATIVE Final    Comment: (NOTE) SARS-CoV-2 target nucleic acids are NOT DETECTED.  The SARS-CoV-2 RNA is generally detectable in upper and lower respiratory specimens during the acute phase of infection. Negative results do not preclude SARS-CoV-2 infection, do not rule out co-infections with other pathogens, and should not be used as the sole basis for treatment or other patient management decisions. Negative results must be combined with clinical observations, patient history, and epidemiological information. The expected result is Negative.  Fact Sheet for Patients: HairSlick.no  Fact Sheet for Healthcare Providers: quierodirigir.com  This test is not yet approved or cleared by the [place] FDA and  has been authorized for detection and/or diagnosis of SARS-CoV-2 by FDA under an Emergency Use Authorization (EUA). This EUA will remain  in effect (meaning this test can be used) for the duration of the COVID-19 declaration under Se ction 564(b)(1) of the Act, 21 U.S.C. section 360bbb-3(b)(1), unless the authorization is terminated or revoked  sooner.  Performed at [facility], [address] [place], [place] [address]          Radiology Studies: No results found.    Scheduled Meds: . aspirin EC  81 mg Oral Daily  . atorvastatin  40 mg Oral Daily  . enoxaparin (LOVENOX) injection  0.5 mg/kg Subcutaneous Q24H  . famotidine  40 mg Oral Daily  . ferrous sulfate  325 mg Oral BID WC  . FLUoxetine  20 mg Oral Daily  . gabapentin  600 mg Oral QHS  . insulin aspart  0-5 Units Subcutaneous QHS  . insulin aspart  0-9 Units Subcutaneous TID WC  . insulin detemir  24 Units Subcutaneous BID  . ipratropium  2 puff Inhalation QID  . loratadine  10 mg Oral Daily  . nystatin   Topical BID  . pantoprazole  40 mg Oral BID  . polyethylene glycol  17 g Oral Daily   Continuous Infusions: . sodium chloride 250 mL (01/08/21 0506)  . ampicillin-sulbactam (UNASYN) IV 3 g (01/08/21 0507)     LOS: 6 days     Osvaldo Shipper, MD [facility] Pager On [email or web address]  01/08/2021, 9:36 AM

## 2021-01-08 NOTE — Plan of Care (Signed)
Discharge teaching completed with patient who is in stable condition. 

## 2021-01-10 ENCOUNTER — Encounter: Payer: Self-pay | Admitting: Podiatry

## 2021-01-10 ENCOUNTER — Telehealth: Payer: Self-pay | Admitting: *Deleted

## 2021-01-10 LAB — SURGICAL PATHOLOGY

## 2021-01-10 MED ORDER — OXYCODONE-ACETAMINOPHEN 5-325 MG PO TABS: 1.0000 | ORAL_TABLET | Freq: Four times a day (QID) | ORAL | 0 refills | Status: DC | PRN

## 2021-01-10 NOTE — Telephone Encounter (Signed)
Patient is recently out of hospital and stated that her pain medicine was sent to [facility] in [place] but insurance will not cover. Please advise.

## 2021-01-11 ENCOUNTER — Encounter: Payer: Self-pay | Admitting: Podiatry

## 2021-01-11 NOTE — Telephone Encounter (Signed)
Returned call to patient and gave her information concerning her prescription per Dr Lilian Kapur, verbalized understanding.

## 2021-01-11 NOTE — Telephone Encounter (Signed)
Returned call to patient to give information per Dr Lilian Kapur, no answer and could not leave Vmessage, will try again later.

## 2021-01-12 ENCOUNTER — Encounter: Payer: Self-pay | Admitting: Podiatry

## 2021-01-12 ENCOUNTER — Other Ambulatory Visit: Payer: Self-pay

## 2021-01-12 ENCOUNTER — Ambulatory Visit (INDEPENDENT_AMBULATORY_CARE_PROVIDER_SITE_OTHER): Payer: Medicaid Other | Admitting: Podiatry

## 2021-01-12 DIAGNOSIS — L97413 Non-pressure chronic ulcer of right heel and midfoot with necrosis of muscle: Secondary | ICD-10-CM

## 2021-01-12 DIAGNOSIS — S91001D Unspecified open wound, right ankle, subsequent encounter: Secondary | ICD-10-CM

## 2021-01-12 DIAGNOSIS — S96901D Unspecified injury of unspecified muscle and tendon at ankle and foot level, right foot, subsequent encounter: Secondary | ICD-10-CM

## 2021-01-12 DIAGNOSIS — E10649 Type 1 diabetes mellitus with hypoglycemia without coma: Secondary | ICD-10-CM

## 2021-01-12 LAB — AEROBIC/ANAEROBIC CULTURE W GRAM STAIN (SURGICAL/DEEP WOUND): Gram Stain: NONE SEEN

## 2021-01-12 NOTE — Patient Instructions (Signed)
Take the bactrim you have at home   Wound Care instructions: Leave dressing intact until 4/22 Friday. Then home nurse should re apply the [facility] with adaptic (or non adherent layer), black sponge x2 (1 to each graft), bridge with sponge and single vacuum pad. Apply gauze dressing to the heel. Change 3x weekly. suction continuous pressure

## 2021-01-16 NOTE — Progress Notes (Signed)
  Subjective:  Patient ID: Emily Collins, female    DOB: [birth year],  MRN: [id number]  Chief Complaint  Patient presents with  . Wound Check    "its about the same"   no changes or concerns today    DOS: 01/07/2021 Procedure: Right ankle I&D with application of Integra and wound VAC  55 y.o. female returns for post-op check.   Review of Systems: Negative except as noted in the HPI. Denies N/V/F/Ch.   Objective:  There were no vitals filed for this visit. There is no height or weight on file to calculate BMI. Constitutional Well developed. Well nourished.  Vascular Foot warm and well perfused. Capillary refill normal to all digits.   Neurologic Normal speech. Oriented to person, place, and time. Epicritic sensation to light touch grossly reduced bilaterally.  Dermatologic  Integra intact with good take, minimal maceration  Orthopedic: Tenderness to palpation noted about the surgical site.    Assessment:   1. Type 1 diabetes mellitus with hypoglycemia without coma (HCC)   2. Ulcer of right heel and midfoot with necrosis of muscle (HCC)   3. Open wound of right ankle with tendon involvement, subsequent encounter    Plan:  Patient was evaluated and treated and all questions answered.  S/p foot surgery right -Progressing as expected post-operatively. -WB Status: Minimal WB in CAM boot, I encouraged her to be using this which she did not have with her today -Sutures: Staples remain intact, remove these and silicone layer next visit. -Medications: No refills required -Foot redressed.  Resume wound VAC on Friday -We will likely have foot drop due to necrosis of TA tendon we will have to see how much of an issue this is and she may need bracing.  Which we discussed  Return in about 2 weeks (around 01/26/2021) for wound care.

## 2021-01-17 ENCOUNTER — Telehealth: Payer: Self-pay

## 2021-01-17 NOTE — Telephone Encounter (Signed)
Brandy with [facility] called, stated that the normal drape that was being used for the wound vac was breaking the patient out, so they switched to Dermatec.  The dermatec is not sticking due to the xeroform dressing.  She was wanting recommendations on what else she could use when she changes the vac.  She also contacted [employer] for recommendations and was waiting for a call back.   Gearldine Bienenstock cb # [phone]

## 2021-01-17 NOTE — Telephone Encounter (Signed)
I spoke with her, [employer] rep is coming to home visit on Wed to see what they can do for it. Thanks

## 2021-01-26 ENCOUNTER — Other Ambulatory Visit: Payer: Self-pay

## 2021-01-26 ENCOUNTER — Ambulatory Visit (INDEPENDENT_AMBULATORY_CARE_PROVIDER_SITE_OTHER): Payer: Medicaid Other | Admitting: Podiatry

## 2021-01-26 DIAGNOSIS — E10649 Type 1 diabetes mellitus with hypoglycemia without coma: Secondary | ICD-10-CM

## 2021-01-26 DIAGNOSIS — S91001D Unspecified open wound, right ankle, subsequent encounter: Secondary | ICD-10-CM

## 2021-01-26 DIAGNOSIS — S96901D Unspecified injury of unspecified muscle and tendon at ankle and foot level, right foot, subsequent encounter: Secondary | ICD-10-CM

## 2021-01-27 MED ORDER — SANTYL 250 UNIT/GM EX OINT: 1.0000 "application " | TOPICAL_OINTMENT | Freq: Every day | CUTANEOUS | 2 refills | Status: DC

## 2021-01-28 NOTE — Progress Notes (Addendum)
  Subjective:  Patient ID: Emily Collins, female    DOB: 1966/06/30,  MRN: [id number]  Chief Complaint  Patient presents with  . Diabetic Ulcer    2 week follow up    DOS: 01/07/2021 Procedure: Right ankle I&D with application of Integra and wound VAC  55 y.o. female returns for post-op check. VAC has been discontinued due to skin irritation  Review of Systems: Negative except as noted in the HPI. Denies N/V/F/Ch.   Objective:  There were no vitals filed for this visit. There is no height or weight on file to calculate BMI. Constitutional Well developed. Well nourished.  Vascular Foot warm and well perfused. Capillary refill normal to all digits.   Neurologic Normal speech. Oriented to person, place, and time. Epicritic sensation to light touch grossly reduced bilaterally.  Dermatologic  Integra intact with good take, minimal maceration Medial anterior ankle ulcer 7.0 x 3.5 x 0.5 cm, mixed fibro-granular wound base Lateral anterior ankle 8.0 x 2.5 x 1.0 cm mixed fibro-granular wound base Plantar heel 2.0 x 0.8 x 0.5 cm primarily fibrotic wound bed  Orthopedic: Tenderness to palpation noted about the surgical site.    Assessment:   1. Type 1 diabetes mellitus with hypoglycemia without coma (HCC)   2. Open wound of right ankle with tendon involvement, subsequent encounter    Plan:  Patient was evaluated and treated and all questions answered.  S/p foot surgery right -Progressing as expected post-operatively. -WB Status: Minimal WB in CAM boot, I encouraged her to be using this which she did not have with her today -Staples removed today and silocone layer -VAC has not been tolerated well with skin irritation. I recommend we swtich to santyl, Rx sent to pharmacy  -We will likely have foot drop due to necrosis of TA tendon we will have to see how much of an issue this is and she may need bracing.  Which we discussed   Medial anterior ankle ulcer 7.0 x 3.5 x 0.5 cm,  mixed fibro-granular wound base Lateral anterior ankle 8.0 x 2.5 x 1.0 cm mixed fibro-granular wound base Plantar heel 2.0 x 0.8 x 0.5 cm primarily fibrotic wound bed  Return in about 2 weeks (around 02/09/2021) for wound care.

## 2021-01-31 ENCOUNTER — Telehealth: Payer: Self-pay

## 2021-01-31 NOTE — Telephone Encounter (Signed)
Emily Collins with [facility]  called and stated that patient was not able to pick up the Santyl from the pharmacy because they do not carry it.  She has been using betadine wet to dry dressing instead.  She wanted to make sure this was ok or do you recommend something else for her to use   Please advise   CB# [phone]

## 2021-02-01 ENCOUNTER — Telehealth: Payer: Self-pay

## 2021-02-01 MED ORDER — MUPIROCIN 2 % EX OINT: 1.0000 "application " | TOPICAL_OINTMENT | Freq: Every day | CUTANEOUS | 2 refills | Status: DC

## 2021-02-01 NOTE — Telephone Encounter (Signed)
Patient called back today.  She stated that she cannot afford the Santyl.  It is $331.99 for a small tube.  Please advise if she can continue with Betadine wet to dry dressing

## 2021-02-01 NOTE — Telephone Encounter (Signed)
I talked with Angie yesterday and gave them updated measurements which I think is why the pharmacy could not dispense it, can check with  her if they have been able to get it or have it ordered? Saline wet-to-dry would be best until they are able to get the Santyl.  Thanks!

## 2021-02-03 NOTE — Telephone Encounter (Signed)
Patient has been notified of new orders per Dr. Lilian Kapur.  She verbalized instructions and understanding

## 2021-02-09 ENCOUNTER — Ambulatory Visit: Payer: Medicaid Other | Admitting: Podiatry

## 2021-02-09 ENCOUNTER — Other Ambulatory Visit: Payer: Self-pay

## 2021-02-09 DIAGNOSIS — L97412 Non-pressure chronic ulcer of right heel and midfoot with fat layer exposed: Secondary | ICD-10-CM | POA: Diagnosis not present

## 2021-02-09 DIAGNOSIS — L97312 Non-pressure chronic ulcer of right ankle with fat layer exposed: Secondary | ICD-10-CM | POA: Diagnosis not present

## 2021-02-13 ENCOUNTER — Encounter: Payer: Self-pay | Admitting: Podiatry

## 2021-02-13 NOTE — Progress Notes (Signed)
  Subjective:  Patient ID: Emily Collins, female    DOB: [birth year],  MRN: [id number]  Chief Complaint  Patient presents with  . Diabetic Ulcer    2 week follow up    DOS: 01/07/2021 Procedure: Right ankle I&D with application of Integra and wound VAC  55 y.o. female returns for post-op check.  Was not able to get sample due to significant cost.  Has not picked up the mupirocin yet  Review of Systems: Negative except as noted in the HPI. Denies N/V/F/Ch.   Objective:  There were no vitals filed for this visit. There is no height or weight on file to calculate BMI. Constitutional Well developed. Well nourished.  Vascular Foot warm and well perfused. Capillary refill normal to all digits.   Neurologic Normal speech. Oriented to person, place, and time. Epicritic sensation to light touch grossly reduced bilaterally.  Dermatologic  Integra intact with good take, minimal maceration Medial anterior ankle ulcer 7.0 x 3.5 x 0.5 cm, largely granular wound base Lateral anterior ankle 8.0 x 2.5 x 1.0 cm mixed largely granular wound base Plantar heel 2.0 x 0.8 x 0.5 cm primarily fibrotic wound bed  Orthopedic: Tenderness to palpation noted about the surgical site.    Assessment:   1. Chronic ulcer of ankle, right, with fat layer exposed (HCC)   2. Ulcer of right heel and midfoot with fat layer exposed (HCC)    Plan:  Patient was evaluated and treated and all questions answered.  S/p foot surgery right -Progressing as expected post-operatively. -WB Status: Minimal WB in short CAM boot she will get this on [employer] -We will submit for authorization for advanced wound care products  .  Procedure: Excisional Debridement of Wound Rationale: Removal of non-viable soft tissue from the wound to promote healing.  Anesthesia: none Post-Debridement Wound Measurements:     Medial anterior ankle ulcer 7.0 x 3.5 x 0.5 cm,  Lateral anterior ankle 8.0 x 2.5 x 1.0 cm  Plantar heel 2.0 x  0.8 x 0.5 cm  Type of Debridement: Sharp Excisional Tissue Removed: Non-viable soft tissue Depth of Debridement: subcutaneous tissue. Technique: Sharp excisional debridement to bleeding, viable wound base.  Dressing: Dry, sterile, compression dressing. Disposition: Patient tolerated procedure well.   Return in about 2 weeks (around 02/23/2021) for wound care.

## 2021-02-23 ENCOUNTER — Other Ambulatory Visit: Payer: Self-pay

## 2021-02-23 ENCOUNTER — Ambulatory Visit (INDEPENDENT_AMBULATORY_CARE_PROVIDER_SITE_OTHER): Payer: Medicaid Other | Admitting: Podiatry

## 2021-02-23 ENCOUNTER — Encounter: Payer: Self-pay | Admitting: Podiatry

## 2021-02-23 DIAGNOSIS — E13621 Other specified diabetes mellitus with foot ulcer: Secondary | ICD-10-CM

## 2021-02-23 DIAGNOSIS — L97509 Non-pressure chronic ulcer of other part of unspecified foot with unspecified severity: Secondary | ICD-10-CM | POA: Diagnosis not present

## 2021-02-23 DIAGNOSIS — L97512 Non-pressure chronic ulcer of other part of right foot with fat layer exposed: Secondary | ICD-10-CM

## 2021-02-23 NOTE — Progress Notes (Addendum)
  Subjective:  Patient ID: Emily Collins, female    DOB: October 16, 1965,  MRN: [id number]  Chief Complaint  Patient presents with  . Foot Ulcer    Diabetic ulcer right ankle, 2 week follow up    DOS: 01/07/2021 Procedure: Right ankle I&D with application of Integra and wound VAC  55 y.o. female returns for post-op check.  Feeling well she was able to get a short boot  Review of Systems: Negative except as noted in the HPI. Denies N/V/F/Ch.   Objective:  There were no vitals filed for this visit. There is no height or weight on file to calculate BMI. Constitutional Well developed. Well nourished.  Vascular Foot warm and well perfused. Capillary refill normal to all digits.   Neurologic Normal speech. Oriented to person, place, and time. Epicritic sensation to light touch grossly reduced bilaterally.  Dermatologic  Integra intact with good take, minimal maceration Medial anterior ankle ulcer 6.2 x 2.5 x 0.2 cm, largely granular wound base Lateral anterior ankle 5.4 x 1.4 x 1 point mixed largely granular wound base Plantar heel 1.9 x 0.5 x 0.7 cm primarily fibrotic wound bed  Orthopedic: Tenderness to palpation noted about the surgical site.    Assessment:   1. 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  S/p foot surgery right -Progressing as expected post-operatively. -WB Status: Minimal WB in short CAM boot she will get this on [employer] -Today following preparation of the wound bed of all devitalized tissue I applied Apligraf to the anteromedial wound bed.  Lot number [id number] expiration 03/04/2021.  Sterile wet-to-dry dressings applied to remaining wounds.  Graft secured with Steri-Strips, Mepitel and ABD pad -No dressing changes until next visit  .  Procedure: Excisional Debridement of Wound Rationale: Removal of non-viable soft tissue from the wound to promote healing.   Anesthesia: none Post-Debridement Wound Measurements:   Medial anterior ankle ulcer 6.2 x 2.5 x 0.2 cm, largely granular wound base Lateral anterior ankle 5.4 x 1.4 x 1 point mixed largely granular wound base Plantar heel 1.9 x 0.5 x 0.7 cm primarily fibrotic wound bed  Type of Debridement: Sharp Excisional Tissue Removed: Non-viable soft tissue Depth of Debridement: subcutaneous tissue. Technique: Sharp excisional debridement to bleeding, viable wound base.  Dressing: Dry, sterile, compression dressing. Disposition: Patient tolerated procedure well.   Return in 12 days (on 03/07/2021) for wound care & graft application .

## 2021-02-28 ENCOUNTER — Telehealth: Payer: Self-pay

## 2021-02-28 NOTE — Telephone Encounter (Signed)
Yes that's fine, can change all the gauze dressings and leave the steri strips and mepitel intact. Change every 3 days

## 2021-02-28 NOTE — Telephone Encounter (Signed)
Patient has been notified of Dr. Vara Guardian instructions and verbalized understanding.

## 2021-02-28 NOTE — Telephone Encounter (Signed)
Patient called and stated that her dressing with the graft smells really bad and is sliding down some.  She wanted to know if she could change only the outer ace and kerlex bandage to help with the odor.  She denied any redness, signs of infection or fever.  Her next appt with you is 03/07/21   Please advise?

## 2021-03-07 ENCOUNTER — Other Ambulatory Visit: Payer: Self-pay

## 2021-03-07 ENCOUNTER — Ambulatory Visit (INDEPENDENT_AMBULATORY_CARE_PROVIDER_SITE_OTHER): Payer: Medicaid Other | Admitting: Podiatry

## 2021-03-07 ENCOUNTER — Encounter: Payer: Self-pay | Admitting: Podiatry

## 2021-03-07 DIAGNOSIS — E13621 Other specified diabetes mellitus with foot ulcer: Secondary | ICD-10-CM | POA: Diagnosis not present

## 2021-03-07 DIAGNOSIS — L97512 Non-pressure chronic ulcer of other part of right foot with fat layer exposed: Secondary | ICD-10-CM

## 2021-03-07 DIAGNOSIS — L97509 Non-pressure chronic ulcer of other part of unspecified foot with unspecified severity: Secondary | ICD-10-CM | POA: Diagnosis not present

## 2021-03-07 NOTE — Progress Notes (Signed)
  Subjective:  Patient ID: Emily Collins, female    DOB: 04-23-1966,  MRN: [id number]  Chief Complaint  Patient presents with   Diabetic Ulcer     WOUND CARE AND GRAFT APPLICATION    DOS: 01/07/2021 Procedure: Right ankle I&D with application of Integra and wound VAC  55 y.o. female returns for post-op check.  Had a decent amount of drainage and malodor they say  Review of Systems: Negative except as noted in the HPI. Denies N/V/F/Ch.   Objective:  There were no vitals filed for this visit. There is no height or weight on file to calculate BMI. Constitutional Well developed. Well nourished.  Vascular Foot warm and well perfused. Capillary refill normal to all digits.   Neurologic Normal speech. Oriented to person, place, and time. Epicritic sensation to light touch grossly reduced bilaterally.  Dermatologic  Integra intact with good take, minimal maceration Medial anterior ankle ulcer 5.6 x 2.3 x 0.3 cm, largely granular wound base Lateral anterior ankle 4.6 times 0.8 x 0.3 cm point mixed largely granular wound base Plantar heel 1.1 x 0.5 x 0.4 cm primarily fibrotic wound bed  Orthopedic: Tenderness to palpation noted about the surgical site.         Assessment:   No diagnosis found.  Plan:  Patient was evaluated and treated and all questions answered.  S/p foot surgery right -Progressing as expected post-operatively. -WB Status: Minimal WB in short CAM boot she will get this on [employer] -Today following preparation of the wound bed of all devitalized tissue I applied Apligraf to the anteromedial wound bed.  Lot number [id number] expiration 03/08/2021.  Sterile wet-to-dry dressings applied to remaining wounds.  Graft secured with Mepitel and ABD pad Kerlix and Ace wrap. No units wasted -They will change outer gauze dressing every 2 days  .  Procedure: Excisional Debridement of Wound Rationale: Removal of non-viable soft tissue from the wound to promote  healing.  Anesthesia: none Post-Debridement Wound Measurements:   Medial anterior ankle ulcer 5.6 x 2.3 x 0.3 cm, Lateral anterior ankle 4.6 times 0.8 x 0.3 cm  Plantar heel 1.1 x 0.5 x 0.4 cm  Type of Debridement: Sharp Excisional Tissue Removed: Non-viable soft tissue Depth of Debridement: subcutaneous tissue. Technique: Sharp excisional debridement to bleeding, viable wound base.  Dressing: Dry, sterile, compression dressing. Disposition: Patient tolerated procedure well.   Return in about 9 days (around 03/16/2021) for wound care / graft.

## 2021-03-16 ENCOUNTER — Other Ambulatory Visit: Payer: Self-pay

## 2021-03-16 ENCOUNTER — Encounter: Payer: Self-pay | Admitting: Podiatry

## 2021-03-16 ENCOUNTER — Ambulatory Visit (INDEPENDENT_AMBULATORY_CARE_PROVIDER_SITE_OTHER): Payer: Medicaid Other | Admitting: Podiatry

## 2021-03-16 DIAGNOSIS — L97509 Non-pressure chronic ulcer of other part of unspecified foot with unspecified severity: Secondary | ICD-10-CM | POA: Diagnosis not present

## 2021-03-16 DIAGNOSIS — E13621 Other specified diabetes mellitus with foot ulcer: Secondary | ICD-10-CM

## 2021-03-16 DIAGNOSIS — L97412 Non-pressure chronic ulcer of right heel and midfoot with fat layer exposed: Secondary | ICD-10-CM

## 2021-03-16 NOTE — Progress Notes (Signed)
  Subjective:  Patient ID: Emily Collins, female    DOB: [birth year],  MRN: [id number]  Chief Complaint  Patient presents with   Wound Check    "Its looking a lot better"    DOS: 01/07/2021 Procedure: Right ankle I&D with application of Integra and wound VAC  55 y.o. female returns for post-op check.    Review of Systems: Negative except as noted in the HPI. Denies N/V/F/Ch.   Objective:  There were no vitals filed for this visit. There is no height or weight on file to calculate BMI. Constitutional Well developed. Well nourished.  Vascular Foot warm and well perfused. Capillary refill normal to all digits.   Neurologic Normal speech. Oriented to person, place, and time. Epicritic sensation to light touch grossly reduced bilaterally.  Dermatologic  Integra intact with good take, minimal maceration Medial anterior ankle ulcer 5.2 x 2.2 x 0.1 cm, largely granular wound base, some hypergranular tissue Lateral anterior ankle 4.3 x 0.6 x 0.3 cm point largely granular wound base Plantar heel 0.5x0.9 0.3 cm fibrogranular wound bed  Orthopedic: Tenderness to palpation noted about the surgical site.              Assessment:   1. 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  S/p foot surgery right -Progressing as expected post-operatively. -WB Status: Minimal WB in short CAM boot she will get this on [employer] -Unfortunately the Apligraf did not arrive in time today with shipping.  Donated piece of PuraPly AM was applied 25 units used [id number] is the lot number and expiration 05/21/2023.  Graft secured with Mepitel and ABD pad Kerlix and Ace wrap. No units wasted -They will change outer gauze dressing every 2 days  .  Procedure: Excisional Debridement of Wound Rationale: Removal of non-viable soft tissue from the  wound to promote healing.  Anesthesia: none Post-Debridement Wound Measurements:   Medial anterior ankle 5.2 x 2.2 x 0.1 cm, Lateral anterior ankle 4.3 x 0.6 x 0.3 cm, Plantar heel 0.5x0.9 0.3 cm  Type of Debridement: Sharp Excisional Tissue Removed: Non-viable soft tissue Depth of Debridement: subcutaneous tissue. Technique: Sharp excisional debridement to bleeding, viable wound base.  Dressing: Dry, sterile, compression dressing. Disposition: Patient tolerated procedure well.   Return in about 1 week (around 03/23/2021) for Graft application to wound.

## 2021-03-23 ENCOUNTER — Other Ambulatory Visit: Payer: Self-pay

## 2021-03-23 ENCOUNTER — Ambulatory Visit (INDEPENDENT_AMBULATORY_CARE_PROVIDER_SITE_OTHER): Payer: Medicaid Other | Admitting: Podiatry

## 2021-03-23 ENCOUNTER — Encounter: Payer: Self-pay | Admitting: Podiatry

## 2021-03-23 DIAGNOSIS — L97513 Non-pressure chronic ulcer of other part of right foot with necrosis of muscle: Secondary | ICD-10-CM

## 2021-03-23 DIAGNOSIS — E13621 Other specified diabetes mellitus with foot ulcer: Secondary | ICD-10-CM | POA: Diagnosis not present

## 2021-03-23 DIAGNOSIS — L97509 Non-pressure chronic ulcer of other part of unspecified foot with unspecified severity: Secondary | ICD-10-CM | POA: Diagnosis not present

## 2021-03-23 NOTE — Progress Notes (Signed)
  Subjective:  Patient ID: Emily Collins, female    DOB: [birth year],  MRN: [id number]  Chief Complaint  Patient presents with   Diabetic Ulcer      WOUND CARE W/ GRAFT APPLICATION    DOS: 01/07/2021 Procedure: Right ankle I&D with application of Integra and wound VAC  55 y.o. female returns for post-op check.    Review of Systems: Negative except as noted in the HPI. Denies N/V/F/Ch.   Objective:  There were no vitals filed for this visit. There is no height or weight on file to calculate BMI. Constitutional Well developed. Well nourished.  Vascular Foot warm and well perfused. Capillary refill normal to all digits.   Neurologic Normal speech. Oriented to person, place, and time. Epicritic sensation to light touch grossly reduced bilaterally.  Dermatologic  Integra intact with good take, minimal maceration Medial anterior ankle ulcer 5.0 x 1.9 x 0.2 cm, largely granular wound base, some hypergranular tissue Lateral anterior ankle 3.4 x 0.5 x 0.2 cm point largely granular wound base Plantar heel 0.7 x 0.5 x0.4 cm fibrogranular wound bed  Orthopedic: Tenderness to palpation noted about the surgical site.         Assessment:   1. 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  S/p foot surgery right -Progressing as expected post-operatively. -WB Status: Minimal WB in short CAM boot she will get this on [employer] -Apligraf was applied to the large medial wound 33 units used [id number] is the lot number and expiration 03/24/2021.  Graft secured with Mepitel and ABD pad and Ace wrap.  11 units were not used because the graft were trimmed to fit to prevent overlap and over maceration -They will change outer gauze dressing every 2 days    Procedure: Excisional Debridement of Wound Rationale: Removal of non-viable soft tissue from the wound to promote healing.  Anesthesia:  none Post-Debridement Wound Measurements:   Medial anterior ankle 5.0 x 1.9 x 0.2 cm, Lateral anterior ankle 3.4 x 0.5 x 0.2 cm, Plantar heel 0.7 x 0.5 x0.4 cm Type of Debridement: Sharp Excisional Tissue Removed: Non-viable soft tissue Depth of Debridement: subcutaneous tissue. Technique: Sharp excisional debridement to bleeding, viable wound base.  Dressing: Dry, sterile, compression dressing. Disposition: Patient tolerated procedure well.   Return in about 1 week (around 03/30/2021) for Graft application to wound.

## 2021-03-30 ENCOUNTER — Ambulatory Visit (INDEPENDENT_AMBULATORY_CARE_PROVIDER_SITE_OTHER): Payer: Medicaid Other | Admitting: Podiatry

## 2021-03-30 ENCOUNTER — Encounter: Payer: Self-pay | Admitting: Podiatry

## 2021-03-30 ENCOUNTER — Other Ambulatory Visit: Payer: Self-pay

## 2021-03-30 DIAGNOSIS — L97509 Non-pressure chronic ulcer of other part of unspecified foot with unspecified severity: Secondary | ICD-10-CM

## 2021-03-30 DIAGNOSIS — E13621 Other specified diabetes mellitus with foot ulcer: Secondary | ICD-10-CM

## 2021-03-30 DIAGNOSIS — L97513 Non-pressure chronic ulcer of other part of right foot with necrosis of muscle: Secondary | ICD-10-CM

## 2021-03-30 DIAGNOSIS — L97512 Non-pressure chronic ulcer of other part of right foot with fat layer exposed: Secondary | ICD-10-CM

## 2021-04-02 NOTE — Progress Notes (Signed)
  Subjective:  Patient ID: Emily Collins, female    DOB: 1966-09-17,  MRN: [id number]  Chief Complaint  Patient presents with   Wound Check    Its getting better    DOS: 01/07/2021 Procedure: Right ankle I&D with application of Integra and wound VAC  55 y.o. female returns for post-op check.    Review of Systems: Negative except as noted in the HPI. Denies N/V/F/Ch.   Objective:  There were no vitals filed for this visit. There is no height or weight on file to calculate BMI. Constitutional Well developed. Well nourished.  Vascular Foot warm and well perfused. Capillary refill normal to all digits.   Neurologic Normal speech. Oriented to person, place, and time. Epicritic sensation to light touch grossly reduced bilaterally.  Dermatologic  Integra intact with good take, minimal maceration Medial anterior ankle ulcer 4.2 x 1.5 x 0.2 cm, largely granular wound base, some hypergranular tissue Lateral anterior ankle 3.2 x 0.5 x 0.3 cm point largely granular wound base Plantar heel 0.7 x 0.4 x 0.5 cm fibrogranular wound bed  Orthopedic: Tenderness to palpation noted about the surgical site.         Assessment:   1. 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  S/p foot surgery right -Progressing as expected post-operatively. -WB Status: Minimal WB in short CAM boot she will get this on [employer] -Apligraf was applied to the large medial wound 33 units used [id number] is the lot number and expiration 04/05/2021.  Graft secured with Mepitel and ABD pad and Ace wrap.  11 units were not used because the graft were trimmed to fit to prevent overlap and over maceration -They will change outer gauze dressing every 2 days    Procedure: Excisional Debridement of Wound Rationale: Removal of non-viable soft tissue from the wound to promote healing.  Anesthesia: none Post-Debridement Wound  Measurements:   Medial anterior ankle 4.2 x 1.5 x 0.2 cm, Lateral anterior ankle 3.2 x 0.5 x 0.3 cm, Plantar heel 0.7 x 0.4 x0.5 cm Type of Debridement: Sharp Excisional Tissue Removed: Non-viable soft tissue Depth of Debridement: subcutaneous tissue. Technique: Sharp excisional debridement to bleeding, viable wound base.  Dressing: Dry, sterile, compression dressing. Disposition: Patient tolerated procedure well.   Return in about 1 week (around 04/06/2021) for wound care, Graft application to wound.

## 2021-04-06 ENCOUNTER — Other Ambulatory Visit: Payer: Self-pay

## 2021-04-06 ENCOUNTER — Ambulatory Visit (INDEPENDENT_AMBULATORY_CARE_PROVIDER_SITE_OTHER): Payer: Medicaid Other | Admitting: Podiatry

## 2021-04-06 DIAGNOSIS — E13621 Other specified diabetes mellitus with foot ulcer: Secondary | ICD-10-CM

## 2021-04-06 DIAGNOSIS — L97513 Non-pressure chronic ulcer of other part of right foot with necrosis of muscle: Secondary | ICD-10-CM | POA: Diagnosis not present

## 2021-04-06 DIAGNOSIS — L97512 Non-pressure chronic ulcer of other part of right foot with fat layer exposed: Secondary | ICD-10-CM

## 2021-04-06 DIAGNOSIS — L97509 Non-pressure chronic ulcer of other part of unspecified foot with unspecified severity: Secondary | ICD-10-CM

## 2021-04-06 NOTE — Progress Notes (Signed)
  Subjective:  Patient ID: Emily Collins, female    DOB: 08/26/1966,  MRN: [id number]  Chief Complaint  Patient presents with   Foot Ulcer     Graft application     DOS: 01/07/2021 Procedure: Right ankle I&D with application of Integra and wound VAC  55 y.o. female returns for post-op check.    Review of Systems: Negative except as noted in the HPI. Denies N/V/F/Ch.   Objective:  There were no vitals filed for this visit. There is no height or weight on file to calculate BMI. Constitutional Well developed. Well nourished.  Vascular Foot warm and well perfused. Capillary refill normal to all digits.   Neurologic Normal speech. Oriented to person, place, and time. Epicritic sensation to light touch grossly reduced bilaterally.  Dermatologic  Integra intact with good take, minimal maceration Medial anterior ankle ulcer 3.3 x 1.5 x 0.2 cm, largely granular wound base, some hypergranular tissue Lateral anterior ankle has healed with epithelialization Plantar heel 0.7 x 0.3 x 0.3 cm fibrogranular wound bed  Orthopedic: Tenderness to palpation noted about the surgical site.              Assessment:   1. 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  S/p foot surgery right -Progressing as expected post-operatively. -WB Status: Minimal WB in short CAM boot she will get this on [employer] -Apligraf was applied to the large medial wound 33 units used [id number] is the lot number and expiration 04/14/2021.  Graft secured with Mepitel and DSD. 22 units were not used because the graft were trimmed to fit to prevent overlap and over maceration -They will change outer gauze dressing every 2 days    Procedure: Excisional Debridement of Wound Rationale: Removal of non-viable soft tissue from the wound to promote healing.  Anesthesia: none Post-Debridement Wound Measurements:   Medial  anterior ankle 3.3 x 1.5 x 0.2 cm, , Plantar heel 0.7 x 0.3 x0.3 cm Type of Debridement: Sharp Excisional Tissue Removed: Non-viable soft tissue Depth of Debridement: subcutaneous tissue. Technique: Sharp excisional debridement to bleeding, viable wound base.  Dressing: Dry, sterile, compression dressing. Disposition: Patient tolerated procedure well.   Return in about 1 week (around 04/13/2021) for wound care, Graft application to wound.

## 2021-04-13 ENCOUNTER — Ambulatory Visit: Payer: Medicaid Other | Admitting: Podiatry

## 2021-04-20 ENCOUNTER — Ambulatory Visit: Payer: Medicaid Other | Admitting: Podiatry

## 2021-04-20 ENCOUNTER — Other Ambulatory Visit: Payer: Self-pay

## 2021-04-20 DIAGNOSIS — L97512 Non-pressure chronic ulcer of other part of right foot with fat layer exposed: Secondary | ICD-10-CM

## 2021-04-20 DIAGNOSIS — E13621 Other specified diabetes mellitus with foot ulcer: Secondary | ICD-10-CM

## 2021-04-20 DIAGNOSIS — L97513 Non-pressure chronic ulcer of other part of right foot with necrosis of muscle: Secondary | ICD-10-CM

## 2021-04-20 NOTE — Progress Notes (Signed)
  Subjective:  Patient ID: Emily Collins, female    DOB: 11-19-1965,  MRN: [id number]  Chief Complaint  Patient presents with   Foot Ulcer      Graft application     DOS: 01/07/2021 Procedure: Right ankle I&D with application of Integra and wound VAC  55 y.o. female returns for post-op check.  Doing quite well  Review of Systems: Negative except as noted in the HPI. Denies N/V/F/Ch.   Objective:  There were no vitals filed for this visit. There is no height or weight on file to calculate BMI. Constitutional Well developed. Well nourished.  Vascular Foot warm and well perfused. Capillary refill normal to all digits.   Neurologic Normal speech. Oriented to person, place, and time. Epicritic sensation to light touch grossly reduced bilaterally.  Dermatologic  Integra intact with good take, minimal maceration Medial anterior ankle ulcer 1.1 x 1.5 x 0.1 cm, largely granular wound base, some hypergranular tissue Lateral anterior ankle has healed with epithelialization Plantar heel 0.5 x 0.2 x 0.2 cm fibrogranular wound bed  Orthopedic: Tenderness to palpation noted about the surgical site.       Assessment:   No diagnosis found.     Plan:  Patient was evaluated and treated and all questions answered.  S/p foot surgery right -Progressing as expected post-operatively. -WB Status: Minimal WB in short CAM boot she will get this on [employer] -Apligraf was applied to the plantar medial heel wound 15 units used [id number] is the lot number and expiration 04/20/2021.  Graft secured with Mepitel and DSD. 29 units were not used because the graft were trimmed to fit to prevent overlap and over maceration -They will change outer gauze dressing every 2 days    Procedure: Excisional Debridement of Wound Rationale: Removal of non-viable soft tissue from the wound to promote healing.  Anesthesia: none Post-Debridement Wound Measurements:   Medial anterior ankle 1.1x1.5x0.1 cm,  , Plantar heel 0.5 x 0.2 x 0.2 cm Type of Debridement: Sharp Excisional Tissue Removed: Non-viable soft tissue Depth of Debridement: subcutaneous tissue. Technique: Sharp excisional debridement to bleeding, viable wound base.  Dressing: Dry, sterile, compression dressing. Disposition: Patient tolerated procedure well.   Return in about 1 week (around 04/27/2021) for wound care, Graft application to wound.

## 2021-04-27 ENCOUNTER — Ambulatory Visit: Payer: Medicaid Other | Admitting: Podiatry

## 2021-04-27 ENCOUNTER — Other Ambulatory Visit: Payer: Self-pay

## 2021-04-27 DIAGNOSIS — E13621 Other specified diabetes mellitus with foot ulcer: Secondary | ICD-10-CM

## 2021-04-27 DIAGNOSIS — L97509 Non-pressure chronic ulcer of other part of unspecified foot with unspecified severity: Secondary | ICD-10-CM

## 2021-04-27 MED ORDER — MUPIROCIN 2 % EX OINT: 1.0000 "application " | TOPICAL_OINTMENT | Freq: Every day | CUTANEOUS | 2 refills | Status: DC

## 2021-05-01 NOTE — Progress Notes (Signed)
  Subjective:  Patient ID: Emily Collins, female    DOB: 1965/11/20,  MRN: [id number]  Chief Complaint  Patient presents with   Foot Ulcer     Graft application     DOS: 01/07/2021 Procedure: Right ankle I&D with application of Integra and wound VAC  55 y.o. female returns for post-op check.  Doing quite well  Review of Systems: Negative except as noted in the HPI. Denies N/V/F/Ch.   Objective:  There were no vitals filed for this visit. There is no height or weight on file to calculate BMI. Constitutional Well developed. Well nourished.  Vascular Foot warm and well perfused. Capillary refill normal to all digits.   Neurologic Normal speech. Oriented to person, place, and time. Epicritic sensation to light touch grossly reduced bilaterally.  Dermatologic Lateral ankle and plantar heel wounds have healed the medial wound is now a partial-thickness wound  Orthopedic: Tenderness to palpation noted about the surgical site.            Assessment:   1. 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  S/p foot surgery right -She is nearly fully healed I recommend daily dressing changes with mupirocin ointment which she has and I sent a refill of this.  Follow-up in 1 week for reevaluation.  Hopefully at this point we will be able to move to a routine surveillance evaluation.   Return in about 1 week (around 05/04/2021) for final wound follow up .

## 2021-05-04 ENCOUNTER — Ambulatory Visit: Payer: Medicaid Other | Admitting: Podiatry

## 2021-05-04 ENCOUNTER — Other Ambulatory Visit: Payer: Self-pay

## 2021-05-04 DIAGNOSIS — L97509 Non-pressure chronic ulcer of other part of unspecified foot with unspecified severity: Secondary | ICD-10-CM | POA: Diagnosis not present

## 2021-05-04 DIAGNOSIS — E13621 Other specified diabetes mellitus with foot ulcer: Secondary | ICD-10-CM | POA: Diagnosis not present

## 2021-05-04 NOTE — Progress Notes (Signed)
  Subjective:  Patient ID: Emily Collins, female    DOB: Apr 17, 1966,  MRN: [id number]  Chief Complaint  Patient presents with   Foot Ulcer     Graft application     DOS: 01/07/2021 Procedure: Right ankle I&D with application of Integra and wound VAC  55 y.o. female returns for post-op check.  Doing quite well.  Wound on the heel remains healed.  Her lateral ankle remains healed  Review of Systems: Negative except as noted in the HPI. Denies N/V/F/Ch.   Objective:  There were no vitals filed for this visit. There is no height or weight on file to calculate BMI. Constitutional Well developed. Well nourished.  Vascular Foot warm and well perfused. Capillary refill normal to all digits.   Neurologic Normal speech. Oriented to person, place, and time. Epicritic sensation to light touch grossly reduced bilaterally.  Dermatologic Lateral ankle and plantar heel wounds have healed the medial wound is now a partial-thickness wound  Orthopedic: Tenderness to palpation noted about the surgical site.     Assessment:   1. 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  S/p foot surgery right -Continue mupirocin and bandaging until drainage stops then leave open to air.  Return in 3 weeks for reevaluation.  Return in about 3 weeks (around 05/25/2021) for wound care.

## 2021-05-25 ENCOUNTER — Ambulatory Visit: Payer: Medicaid Other | Admitting: Podiatry

## 2021-09-28 ENCOUNTER — Ambulatory Visit: Payer: Medicaid Other | Admitting: Podiatry

## 2021-11-12 IMAGING — CR DG SHOULDER 2+V*R*
3 series · 3 of 3 positions shown · non-contrast
Comparison: None

CLINICAL DATA: Posterior RIGHT shoulder pain for 3 days, awoke with
pain in her neck moving into RIGHT shoulder 3 days ago, with history
of arthritis, diabetes mellitus, GERD, asthma, former smoker,
neuromuscular disorder, hyperlipidemia

EXAM:
RIGHT SHOULDER - 2+ VIEW

[shoulder grashey]
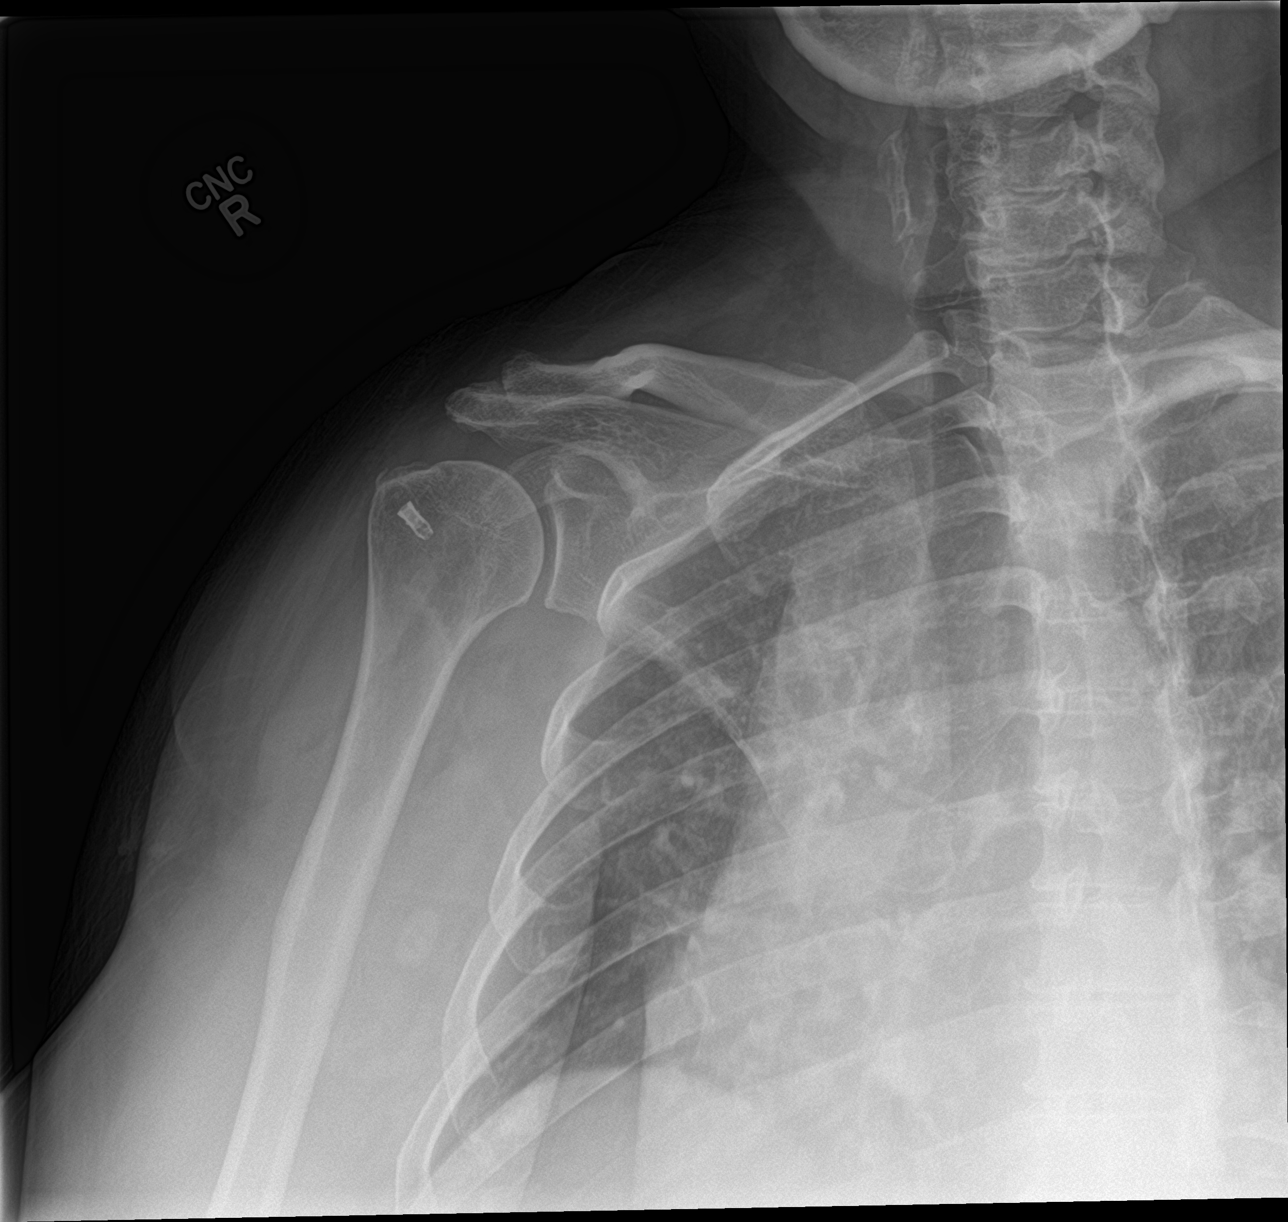

[shoulder y view]
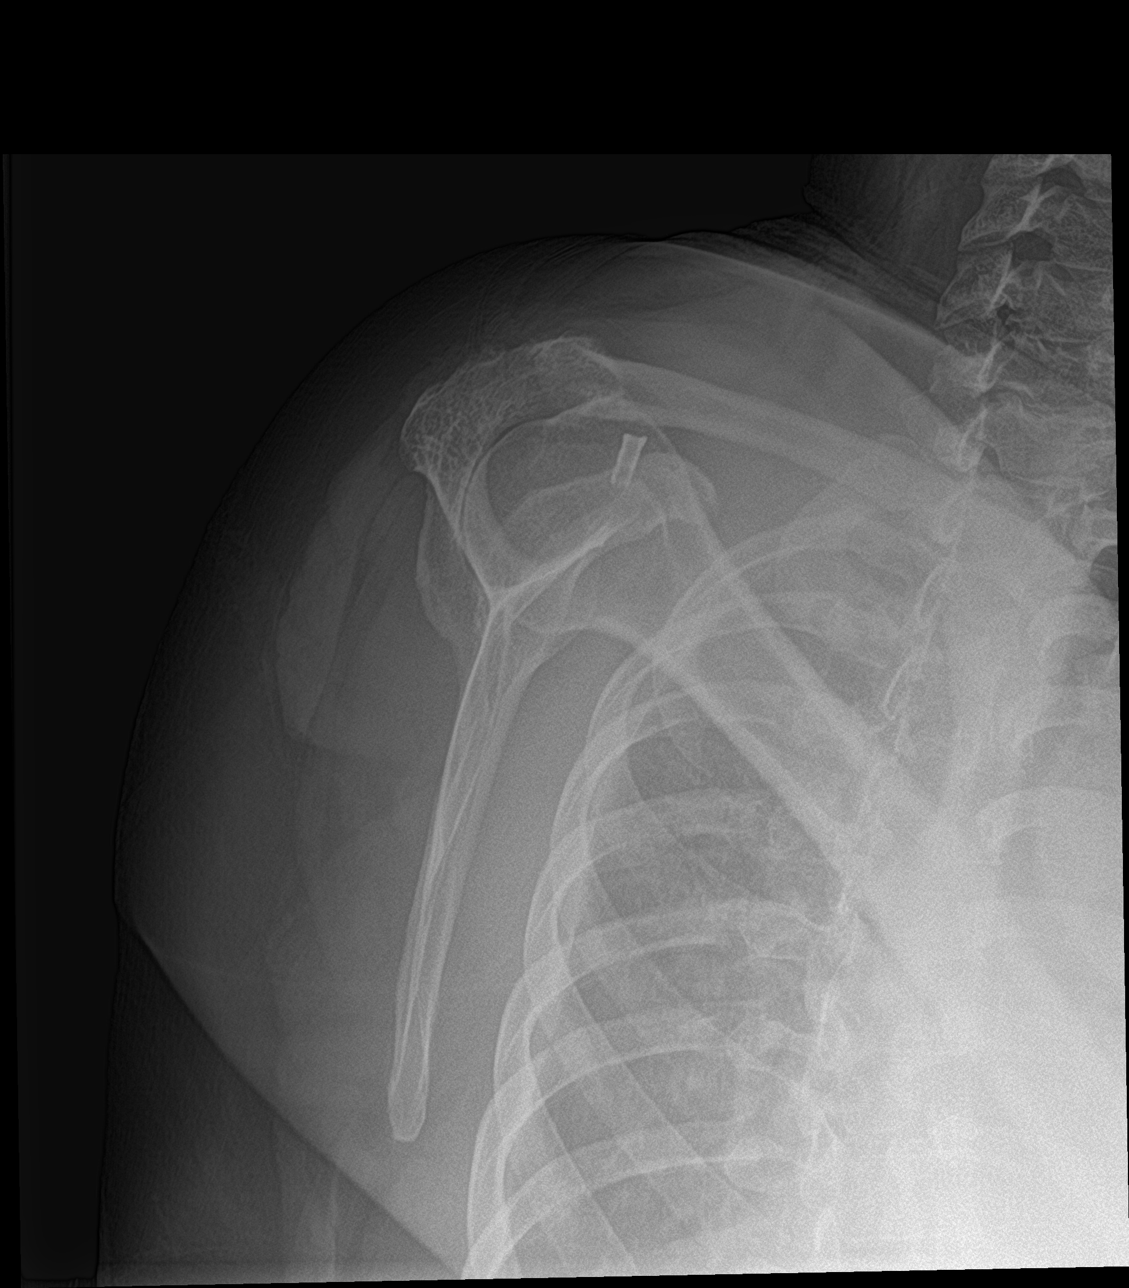

[shoulder axillary]
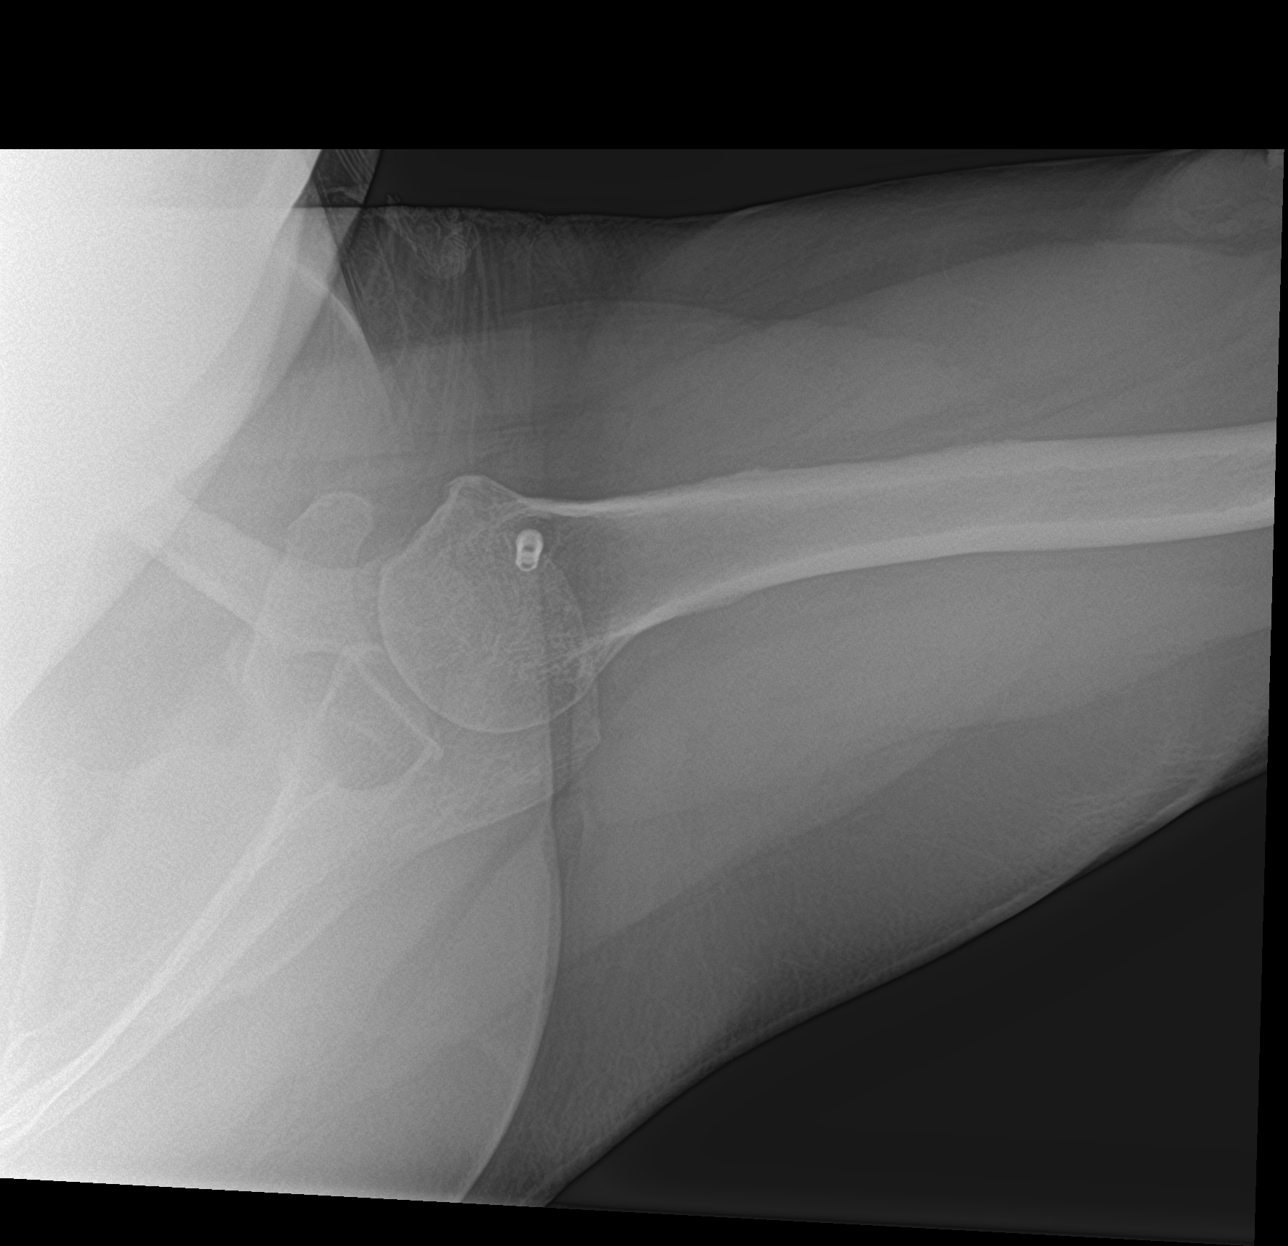

[3 of 3 positions shown; findings below may reference images not displayed]

FINDINGS: Osseous demineralization.

Surgical anchor at greater tuberosity, question rotator cuff repair.

Mild degenerative changes of RIGHT AC joint.

Visualized RIGHT ribs intact.

No acute fracture, dislocation, or bone destruction.
IMPRESSION: Mild degenerative changes RIGHT AC joint.

## 2021-11-28 IMAGING — US US EXTREM LOW VENOUS*R*
1 series · 14 of 24 positions shown · non-contrast
Comparison: None.

CLINICAL DATA: Pain and swelling.

EXAM:
RIGHT LOWER EXTREMITY VENOUS DOPPLER ULTRASOUND
TECHNIQUE: Gray-scale sonography with compression, as well as color and duplex
ultrasound, were performed to evaluate the deep venous system(s)
from the level of the common femoral vein through the popliteal and
proximal calf veins.

[Series 1: us venous img lower uni right (dvt) · portal-venous · 14 of 39 slices shown]
[im 1/39]
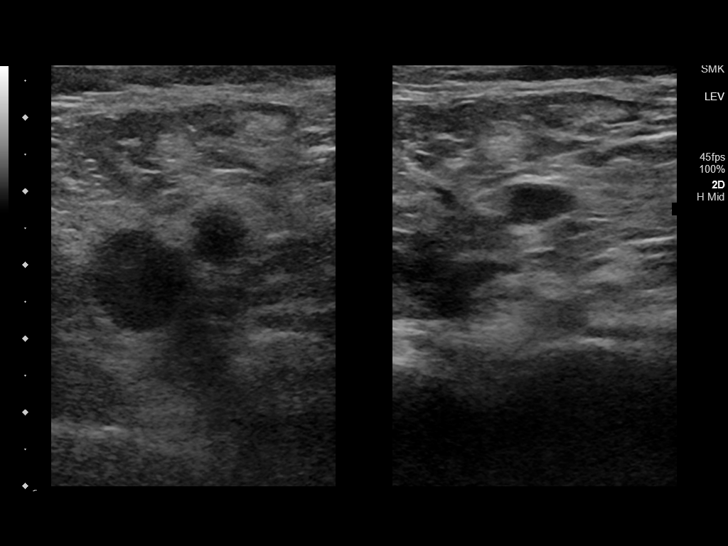
[im 4/39]
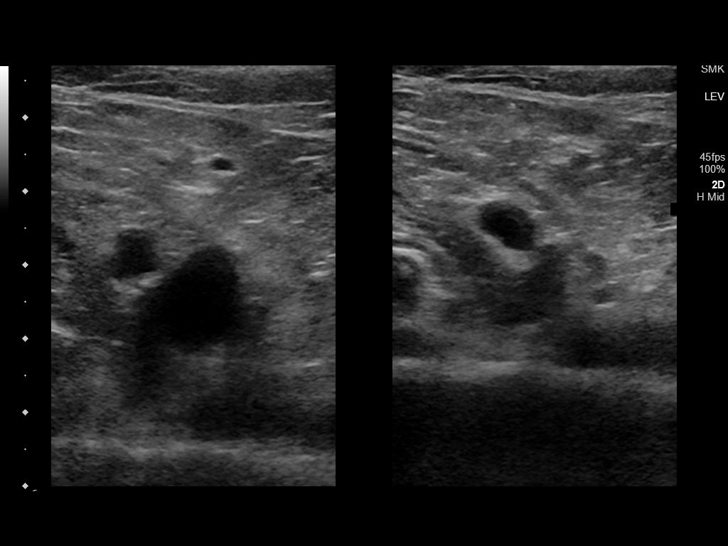
[im 7/39]
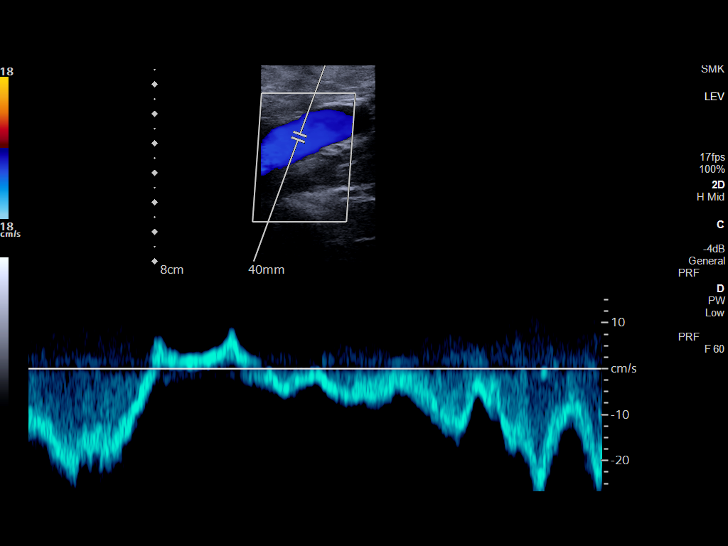
[im 10/39]
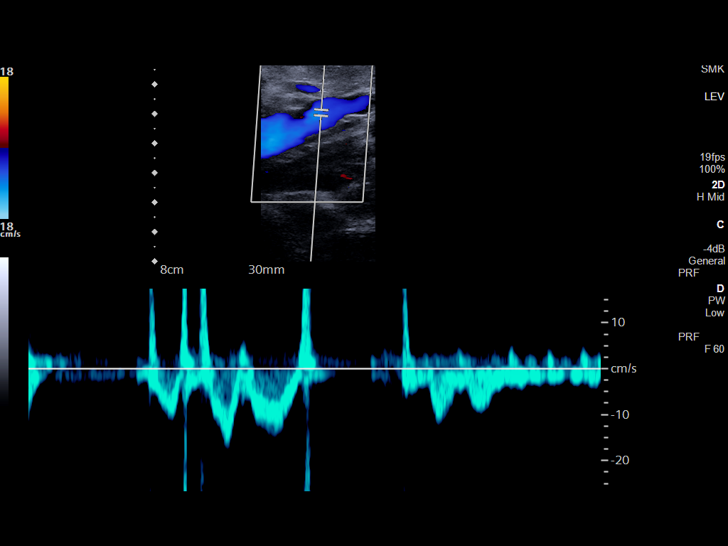
[im 12/39]
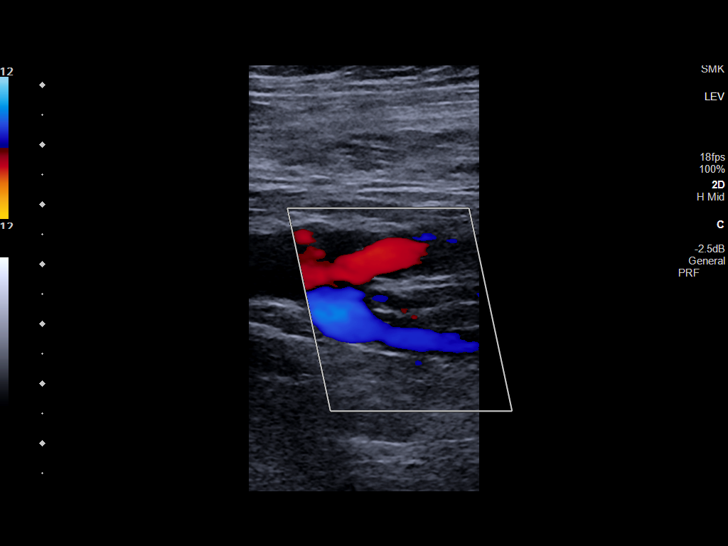
[im 15/39]
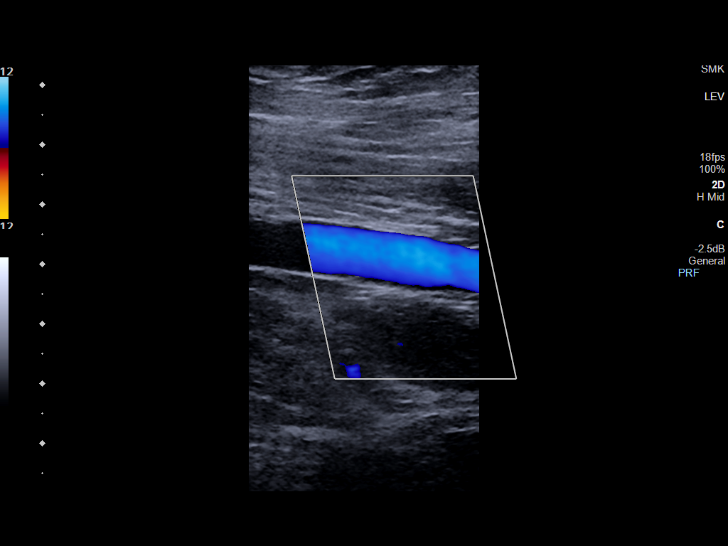
[im 19/39]
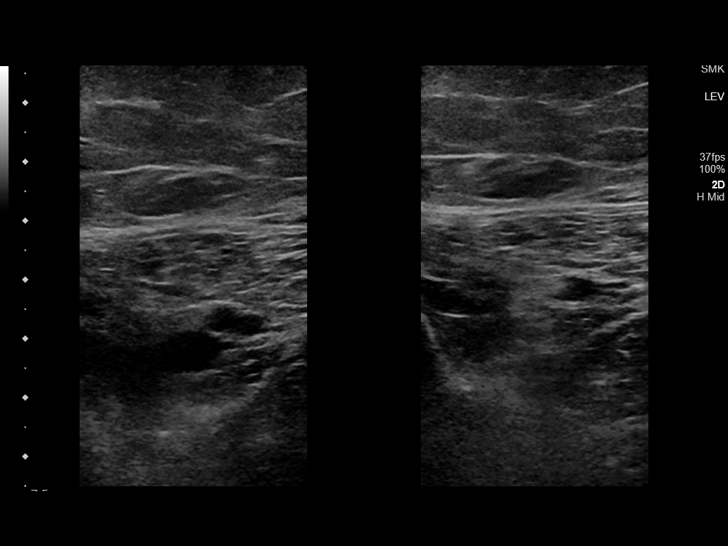
[im 20/39]
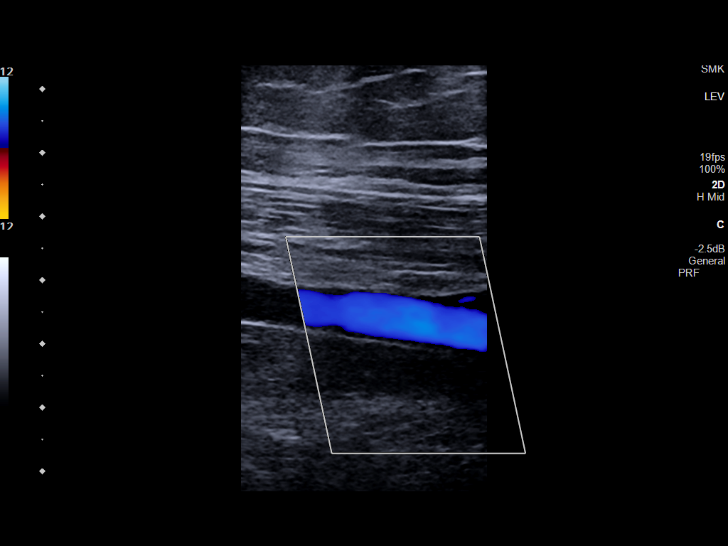
[im 24/39]
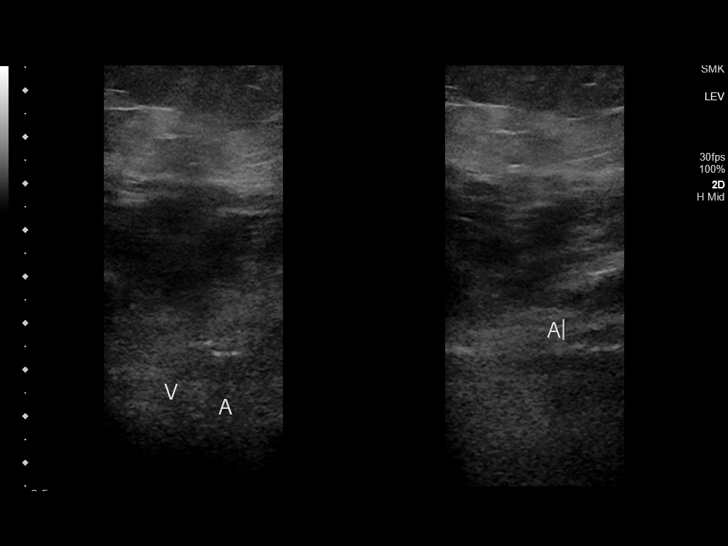
[im 27/39]
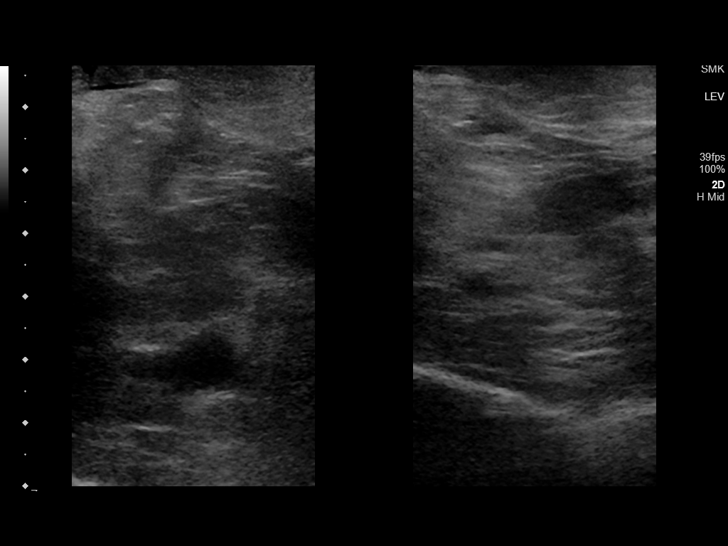
[im 30/39]
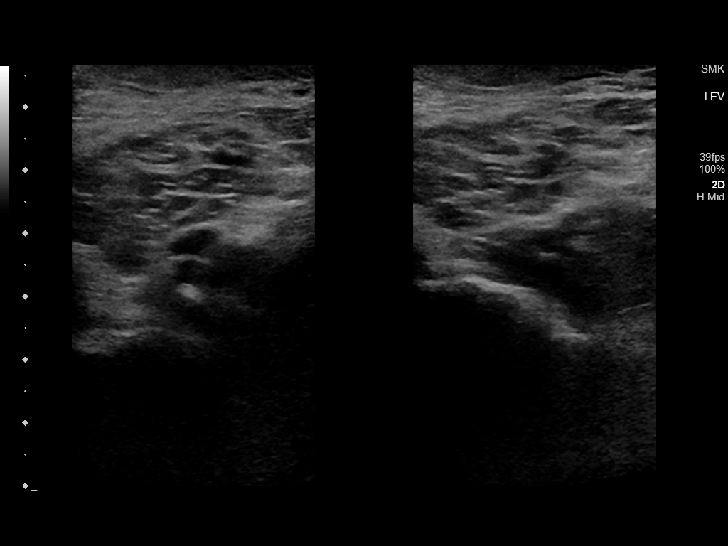
[im 32/39]
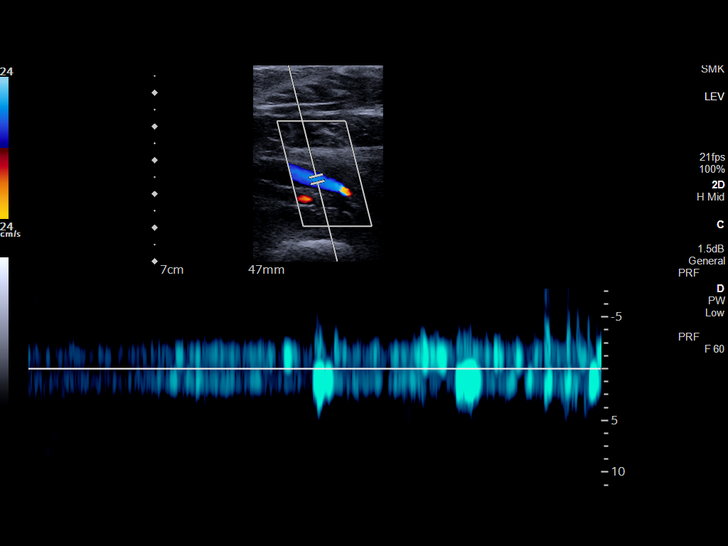
[im 35/39]
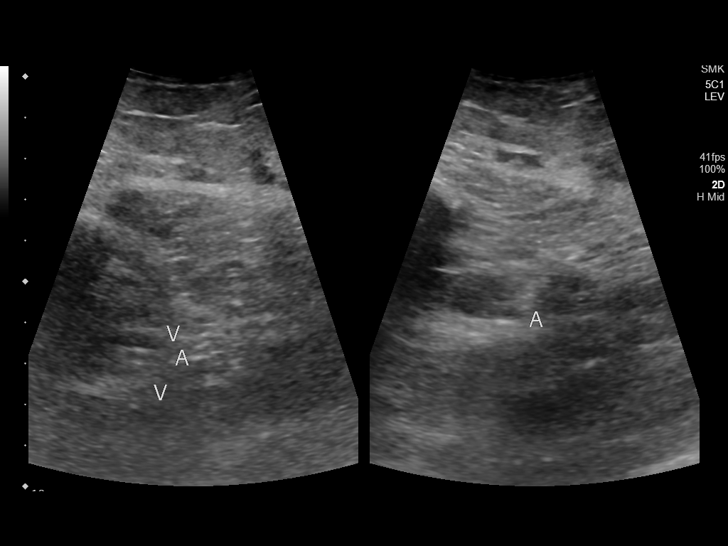
[im 39/39]
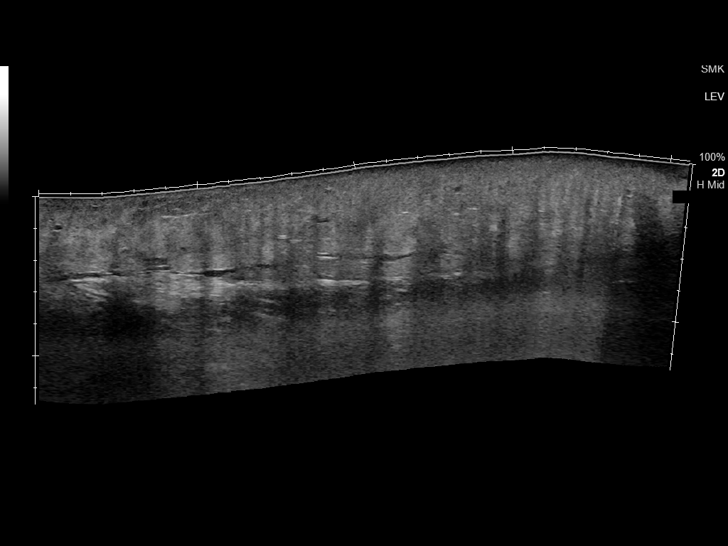

[14 of 24 positions shown; findings below may reference images not displayed]

FINDINGS: VENOUS

Normal compressibility of the common femoral, superficial femoral,
and popliteal veins, as well as the visualized calf veins.
Visualized portions of profunda femoral vein and great saphenous
vein unremarkable. No filling defects to suggest DVT on grayscale or
color Doppler imaging. Doppler waveforms show normal direction of
venous flow, normal respiratory plasticity and response to
augmentation.

Limited views of the contralateral common femoral vein are
unremarkable.

Limitations: Limited evaluation of the right calf veins due to
edema.
IMPRESSION: No evidence of DVT with limited visualization of the calf veins due
to edema.

## 2021-11-28 IMAGING — DX DG FOOT COMPLETE 3+V*R*
3 series · 3 of 3 positions shown · non-contrast
Comparison: None.

CLINICAL DATA: Right heel wound and cellulitis.

EXAM:
RIGHT FOOT COMPLETE - 3+ VIEW

[foot ap]
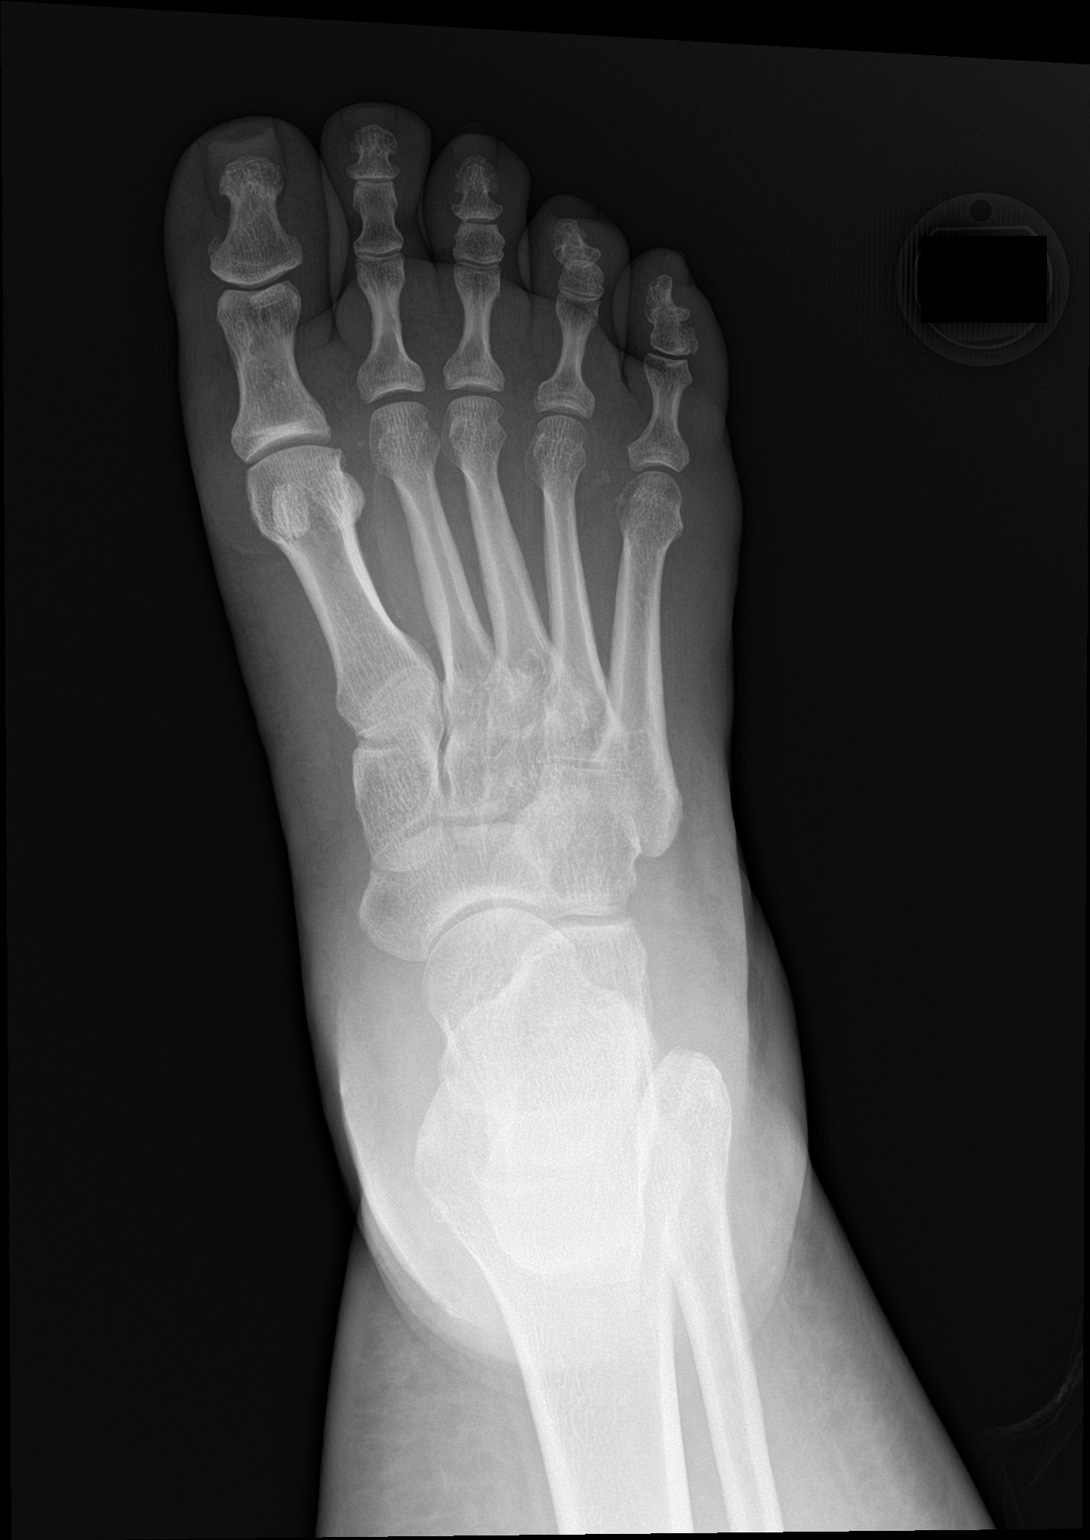

[foot obl]
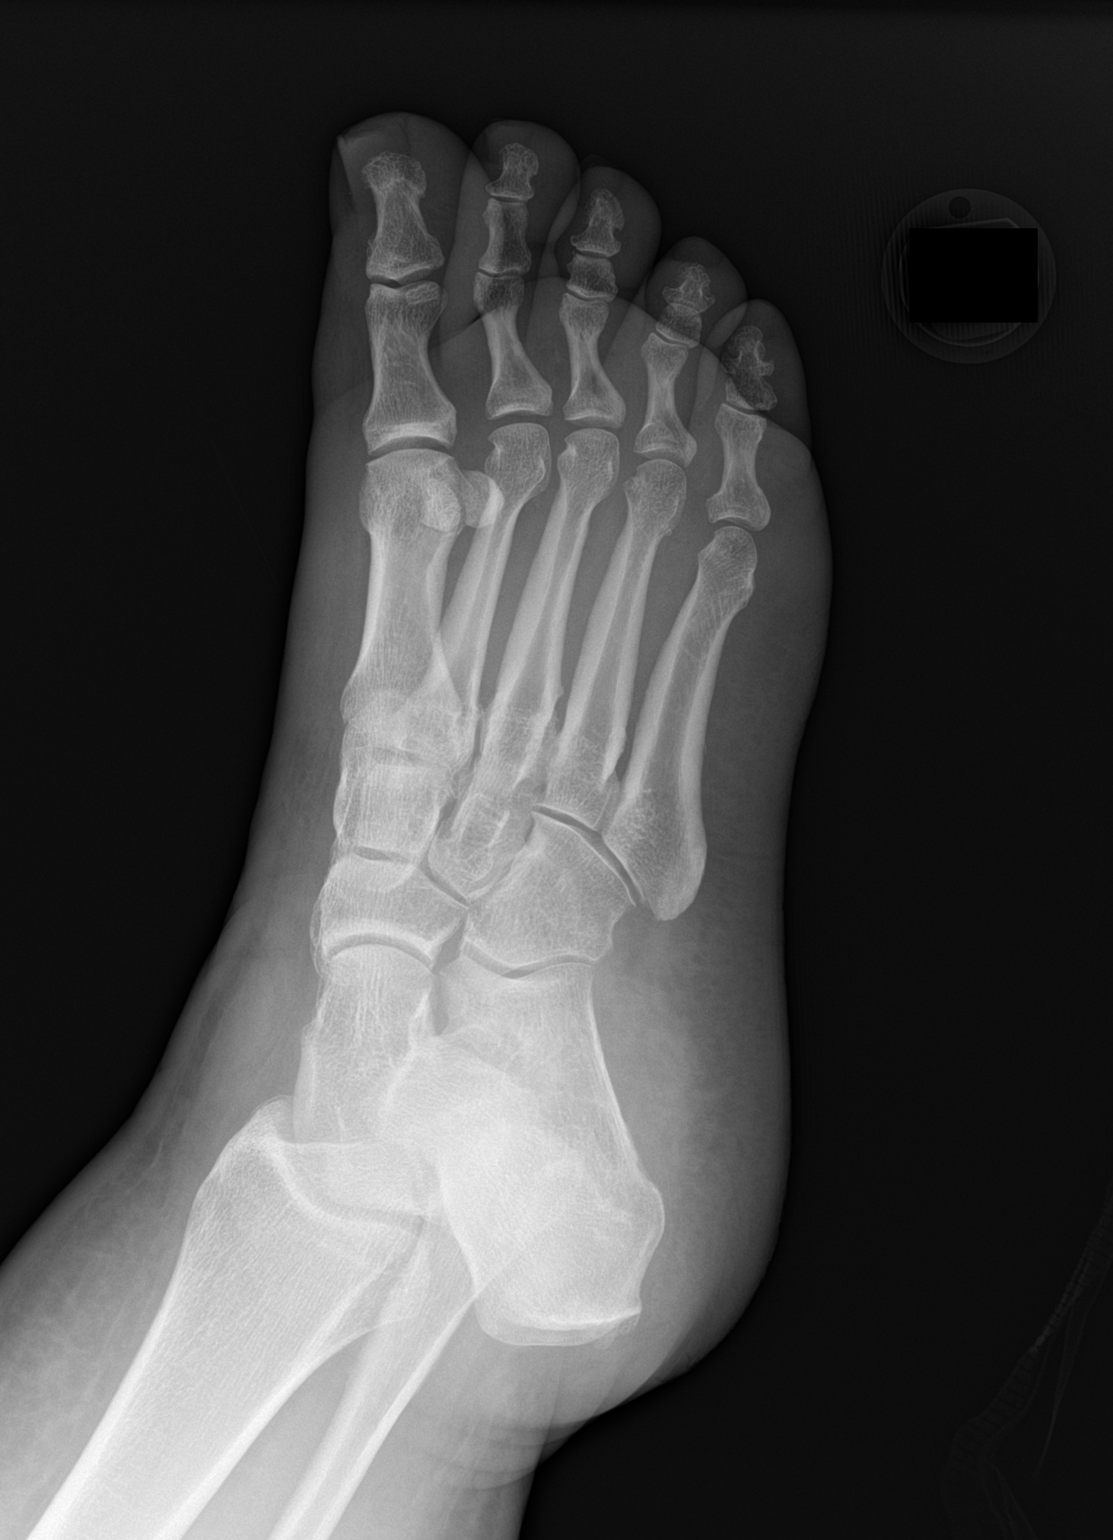

[foot lat]
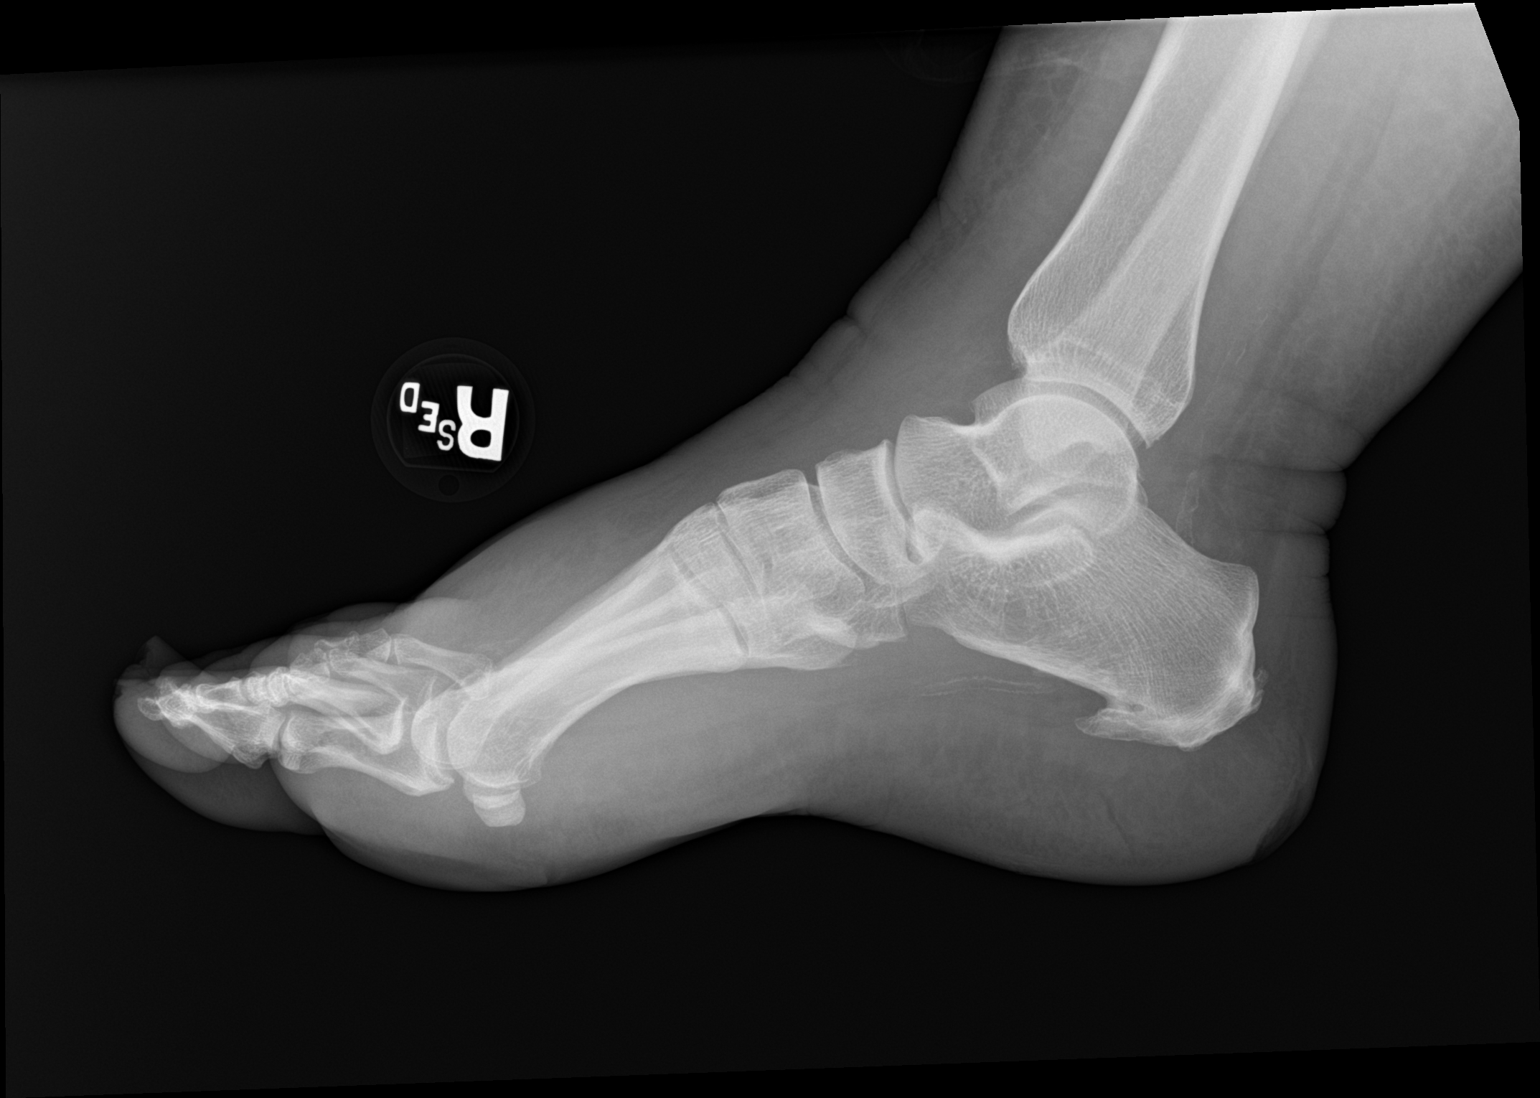

[3 of 3 positions shown; findings below may reference images not displayed]

FINDINGS: There is a ulcer overlying the posterior calcaneus. There is no
underlying radiographic evidence for osteomyelitis. There is a large
plantar calcaneal spur. There are vascular calcifications. There is
soft tissue swelling about the foot without evidence for an acute
displaced fracture or dislocation.
IMPRESSION: 1. Soft tissue ulcer overlying the posterior calcaneus without
underlying radiographic evidence for osteomyelitis.
2. No acute displaced fracture or dislocation.
3. Vascular calcifications.
4. Soft tissue swelling about the foot.

## 2022-08-25 ENCOUNTER — Emergency Department: Payer: Medicaid Other

## 2022-08-25 ENCOUNTER — Encounter: Payer: Self-pay | Admitting: Emergency Medicine

## 2022-08-25 ENCOUNTER — Other Ambulatory Visit: Payer: Self-pay

## 2022-08-25 ENCOUNTER — Emergency Department
Admission: EM | Admit: 2022-08-25 | Discharge: 2022-08-25 | Disposition: A | Discharge status: Home / Self Care | Payer: Medicaid Other | Attending: Emergency Medicine | Admitting: Emergency Medicine

## 2022-08-25 DIAGNOSIS — J189 Pneumonia, unspecified organism: Secondary | ICD-10-CM

## 2022-08-25 DIAGNOSIS — F17200 Nicotine dependence, unspecified, uncomplicated: Secondary | ICD-10-CM | POA: Insufficient documentation

## 2022-08-25 DIAGNOSIS — E1022 Type 1 diabetes mellitus with diabetic chronic kidney disease: Secondary | ICD-10-CM | POA: Insufficient documentation

## 2022-08-25 DIAGNOSIS — D72829 Elevated white blood cell count, unspecified: Secondary | ICD-10-CM | POA: Insufficient documentation

## 2022-08-25 DIAGNOSIS — U071 COVID-19: Secondary | ICD-10-CM | POA: Diagnosis not present

## 2022-08-25 DIAGNOSIS — J45909 Unspecified asthma, uncomplicated: Secondary | ICD-10-CM | POA: Diagnosis not present

## 2022-08-25 DIAGNOSIS — J1282 Pneumonia due to coronavirus disease 2019: Secondary | ICD-10-CM | POA: Diagnosis not present

## 2022-08-25 DIAGNOSIS — N189 Chronic kidney disease, unspecified: Secondary | ICD-10-CM | POA: Diagnosis not present

## 2022-08-25 DIAGNOSIS — R0602 Shortness of breath: Secondary | ICD-10-CM | POA: Diagnosis present

## 2022-08-25 LAB — BASIC METABOLIC PANEL
Anion gap: 6 (ref 5–15)
BUN: 24 mg/dL — ABNORMAL HIGH (ref 6–20)
CO2: 23 mmol/L (ref 22–32)
Calcium: 8.8 mg/dL — ABNORMAL LOW (ref 8.9–10.3)
Chloride: 108 mmol/L (ref 98–111)
Creatinine, Ser: 1.41 mg/dL — ABNORMAL HIGH (ref 0.44–1.00)
GFR, Estimated: 44 mL/min — ABNORMAL LOW (ref >60)
Glucose, Bld: 148 mg/dL — ABNORMAL HIGH (ref 70–99)
Potassium: 5.1 mmol/L (ref 3.5–5.1)
Sodium: 137 mmol/L (ref 135–145)

## 2022-08-25 LAB — RESP PANEL BY RT-PCR (FLU A&B, COVID) ARPGX2
Influenza A by PCR: NEGATIVE
Influenza B by PCR: NEGATIVE
SARS Coronavirus 2 by RT PCR: POSITIVE — AB

## 2022-08-25 LAB — CBC
HCT: 32.1 % — ABNORMAL LOW (ref 36.0–46.0)
Hemoglobin: 9.7 g/dL — ABNORMAL LOW (ref 12.0–15.0)
MCH: 27.6 pg (ref 26.0–34.0)
MCHC: 30.2 g/dL (ref 30.0–36.0)
MCV: 91.5 fL (ref 80.0–100.0)
Platelets: 409 10*3/uL — ABNORMAL HIGH (ref 150–400)
RBC: 3.51 MIL/uL — ABNORMAL LOW (ref 3.87–5.11)
RDW: 15 % (ref 11.5–15.5)
WBC: 13.2 10*3/uL — ABNORMAL HIGH (ref 4.0–10.5)
nRBC: 0 % (ref 0.0–0.2)

## 2022-08-25 LAB — TROPONIN I (HIGH SENSITIVITY): Troponin I (High Sensitivity): 7 ng/L (ref <18)

## 2022-08-25 MED ORDER — BENZONATATE 100 MG PO CAPS: 100.0000 mg | ORAL_CAPSULE | Freq: Three times a day (TID) | ORAL | 0 refills | Status: AC | PRN

## 2022-08-25 MED ORDER — IPRATROPIUM-ALBUTEROL 0.5-2.5 (3) MG/3ML IN SOLN
3.0000 mL | Freq: Once | RESPIRATORY_TRACT | Status: AC
  Administered 2022-08-25: 3 mL via RESPIRATORY_TRACT
  Filled 2022-08-25: qty 3

## 2022-08-25 MED ORDER — NIRMATRELVIR/RITONAVIR (PAXLOVID) TABLET (RENAL DOSING): 2.0000 | ORAL_TABLET | Freq: Two times a day (BID) | ORAL | 0 refills | Status: DC

## 2022-08-25 MED ORDER — AMOXICILLIN-POT CLAVULANATE 875-125 MG PO TABS: 1.0000 | ORAL_TABLET | Freq: Two times a day (BID) | ORAL | 0 refills | Status: AC

## 2022-08-25 MED ORDER — MOLNUPIRAVIR EUA 200MG CAPSULE: 4.0000 | ORAL_CAPSULE | Freq: Two times a day (BID) | ORAL | 0 refills | Status: AC

## 2022-08-25 MED ORDER — ALBUTEROL SULFATE HFA 108 (90 BASE) MCG/ACT IN AERS: 2.0000 | INHALATION_SPRAY | Freq: Four times a day (QID) | RESPIRATORY_TRACT | 2 refills | Status: DC | PRN

## 2022-08-25 MED ORDER — PREDNISONE 20 MG PO TABS: 40.0000 mg | ORAL_TABLET | Freq: Every day | ORAL | 0 refills | Status: AC

## 2022-08-25 MED ORDER — PREDNISONE 20 MG PO TABS
40.0000 mg | ORAL_TABLET | Freq: Once | ORAL | Status: AC
  Administered 2022-08-25: 40 mg via ORAL
  Filled 2022-08-25: qty 2

## 2022-08-25 NOTE — ED Provider Notes (Addendum)
[facility] Provider Note    Event Date/Time   First MD Initiated Contact with Patient 08/25/22 1114     (approximate)   History   Pneumonia   HPI  Emily Collins is a 56 y.o. female with type 1 diabetes, CKD, morbid obesity who comes in with concerns for pneumonia.  Patient reports being diagnosed with pneumonia a week ago and has been on medications but has had no improvement.  Patient reports being on azithromycin and amoxicillin.  She reports that she continues to feel short of breath with coughing and congestion in her nose.  She reports that her son is also been sick with similar symptoms but his x-ray did not show any pneumonia just hers.  She reports having a history of asthma and remote smoking history and having an ipratropium inhaler but not a albuterol inhaler.  She does report that she feels better when she uses her inhaler.  She denies any history of PE or risk factors for PE.   Physical Exam   Triage Vital Signs: ED Triage Vitals  Enc Vitals Group     BP 08/25/22 1105 (!) 148/44     Pulse Rate 08/25/22 1105 75     Resp 08/25/22 1105 (!) 21     Temp 08/25/22 1105 97.9 F (36.6 C)     Temp src --      SpO2 08/25/22 1105 96 %     Weight --      Height --      Head Circumference --      Peak Flow --      Pain Score 08/25/22 1017 4     Pain Loc --      Pain Edu? --      Excl. in GC? --     Most recent vital signs: Vitals:   08/25/22 1105  BP: (!) 148/44  Pulse: 75  Resp: (!) 21  Temp: 97.9 F (36.6 C)  SpO2: 96%     General: Awake, no distress.  CV:  Good peripheral perfusion.  Resp:  Normal effort.  Patient has mild wheezing noted bilaterally. Abd:  No distention.  Other:  No swelling in legs.  No calf tenderness   ED Results / Procedures / Treatments   Labs (all labs ordered are listed, but only abnormal results are displayed) Labs Reviewed  RESP PANEL BY RT-PCR (FLU A&B, COVID) ARPGX2 - Abnormal; Notable for the  following components:      Result Value   SARS Coronavirus 2 by RT PCR POSITIVE (*)    All other components within normal limits  BASIC METABOLIC PANEL - Abnormal; Notable for the following components:   Glucose, Bld 148 (*)    BUN 24 (*)    Creatinine, Ser 1.41 (*)    Calcium 8.8 (*)    GFR, Estimated 44 (*)    All other components within normal limits  CBC - Abnormal; Notable for the following components:   WBC 13.2 (*)    RBC 3.51 (*)    Hemoglobin 9.7 (*)    HCT 32.1 (*)    Platelets 409 (*)    All other components within normal limits  TROPONIN I (HIGH SENSITIVITY)     EKG  My interpretation of EKG:  Normal sinus rate of 72 without any ST elevation or T wave inversions but she has a lot of either artifact or possible underlying atrial flutter.  We will get a repeat EKG to ensure  it is sinus  Normal sinus rate of 69 without any ST elevation or T wave inversions, normal intervals  RADIOLOGY I have reviewed the xray personally and interpreted and patient has some opacifications noted in the right upper and lower lobes   PROCEDURES:  Critical Care performed: No  Procedures   MEDICATIONS ORDERED IN ED: Medications  ipratropium-albuterol (DUONEB) 0.5-2.5 (3) MG/3ML nebulizer solution 3 mL (3 mLs Nebulization Given 08/25/22 1149)  predniSONE (DELTASONE) tablet 40 mg (40 mg Oral Given 08/25/22 1148)     IMPRESSION / MDM / ASSESSMENT AND PLAN / ED COURSE  I reviewed the triage vital signs and the nursing notes.   Patient's presentation is most consistent with acute presentation with potential threat to life or bodily function.   She comes in with symptoms concerning for possible pneumonia and/or asthma exacerbation.  On examination she got no evidence of PE not hypoxic not tachycardic and has no risk factors for PE given her multiple symptoms of cough, congestion that seems more consistent with infection.  She also reports that her son is also sick.  Her COVID test  was positive.  Flu test was negative.  BMP shows creatinine around its baseline.  CBC shows elevated white count.  Her hemoglobin is slightly low but stable from her baseline.  Her troponin is negative no evidence of heart attack.  Patient was given a DuoNeb and ambulated with oxygen levels above 94%.  She reports feeling much better.  Patient's COVID test did come back positive.  She reports that she was COVID-negative a week ago so it is unclear exactly when this happened but I suspect that it is a new infection which is making her feel worse therefore we discussed the pros and cons of Paxlovid and I discussed with Dareen Piano from pharmacy and patient is to hold her statin and and do the reduced dose of the Paxlovid.  We will still put her on a course of Augmentin in case there could be a concurrent pneumonia as well as we discussed the steroids with the wheezing and she would like to proceed with these and she will go up on her insulin as well as the inhaler.  She understands to return if symptoms are worsening or any other concerns  We also discussed following up for repeat x-ray in 4 weeks to ensure resolution of pneumonia and make sure there is no underlying nodule or mass.  Considered admission given failing outpatient oral antibiotics but patient was not on maximal antibiotics and now has COVID so I suspect that this could be the cause of her worsening symptoms and given she is not hypoxic with ambulation she would prefer to trial outpatient treatment.  She understands that she can return if symptoms are worsening  Discussed with pharm and recommended molnupravir instead due to being on prozac and qtc prolongation. Tried to call to cancel paxlovid but no answers- d/w pt so she is aware to take molnupravir only.   FINAL CLINICAL IMPRESSION(S) / ED DIAGNOSES   Final diagnoses:  Pneumonia of right lung due to infectious organism, unspecified part of lung  COVID-19     Rx / DC Orders   ED  Discharge Orders     None        Note:  This document was prepared using Dragon voice recognition software and may include unintentional dictation errors.   Concha Se, MD 08/25/22 1301    Concha Se, MD 08/25/22 1315    Artis Delay  E, MD 08/25/22 1316    Concha Se, MD 08/25/22 1332    Concha Se, MD 08/25/22 1335    Concha Se, MD 08/25/22 [phone]

## 2022-08-25 NOTE — Discharge Instructions (Addendum)
Take the antibiotics in case there is a bacterial infection in the monurpiravir for your COVID.  You can take the steroids but if your sugars are going up too high that you can discontinue these.  These can help with some of the inflammation given your asthma and your wheezing.  We have also prescribed inhaler to help with your breathing.  You should return to the ER if you develop worsening shortness of breath or any other concerns.  Hold the Statin while on mulnupravir  DO not take paxlovid given on prozac   Follow-up for repeat x-ray in 4 weeks to ensure resolution of your pneumonia.

## 2022-08-25 NOTE — ED Triage Notes (Signed)
Pt comes with c/o pneumonia. Pt state she was dx with this week ago. Pt state she was given meds but no improvement.

## 2022-10-02 ENCOUNTER — Other Ambulatory Visit: Payer: Self-pay | Admitting: Internal Medicine

## 2022-10-02 ENCOUNTER — Ambulatory Visit
Admission: RE | Admit: 2022-10-02 | Discharge: 2022-10-02 | Disposition: A | Discharge status: Home / Self Care | Payer: Medicaid Other | Source: Ambulatory Visit | Attending: Internal Medicine | Admitting: Internal Medicine

## 2022-10-02 DIAGNOSIS — M7989 Other specified soft tissue disorders: Secondary | ICD-10-CM | POA: Diagnosis not present

## 2022-10-22 ENCOUNTER — Encounter: Payer: Self-pay | Admitting: Emergency Medicine

## 2022-10-22 ENCOUNTER — Emergency Department
Admission: EM | Admit: 2022-10-22 | Discharge: 2022-10-23 | Disposition: A | Discharge status: Home / Self Care | Payer: Medicaid Other | Attending: Emergency Medicine | Admitting: Emergency Medicine

## 2022-10-22 ENCOUNTER — Emergency Department: Payer: Medicaid Other

## 2022-10-22 ENCOUNTER — Other Ambulatory Visit: Payer: Self-pay

## 2022-10-22 DIAGNOSIS — R220 Localized swelling, mass and lump, head: Secondary | ICD-10-CM | POA: Diagnosis not present

## 2022-10-22 DIAGNOSIS — L03818 Cellulitis of other sites: Secondary | ICD-10-CM | POA: Diagnosis not present

## 2022-10-22 DIAGNOSIS — L03811 Cellulitis of head [any part, except face]: Secondary | ICD-10-CM

## 2022-10-22 DIAGNOSIS — R221 Localized swelling, mass and lump, neck: Secondary | ICD-10-CM | POA: Diagnosis not present

## 2022-10-22 DIAGNOSIS — H04319 Phlegmonous dacryocystitis of unspecified lacrimal passage: Secondary | ICD-10-CM | POA: Insufficient documentation

## 2022-10-22 DIAGNOSIS — L0291 Cutaneous abscess, unspecified: Secondary | ICD-10-CM

## 2022-10-22 LAB — CBC WITH DIFFERENTIAL/PLATELET
Abs Immature Granulocytes: 0.08 10*3/uL — ABNORMAL HIGH (ref 0.00–0.07)
Basophils Absolute: 0.1 10*3/uL (ref 0.0–0.1)
Basophils Relative: 1 %
Eosinophils Absolute: 1 10*3/uL — ABNORMAL HIGH (ref 0.0–0.5)
Eosinophils Relative: 6 %
HCT: 34.5 % — ABNORMAL LOW (ref 36.0–46.0)
Hemoglobin: 10.5 g/dL — ABNORMAL LOW (ref 12.0–15.0)
Immature Granulocytes: 1 %
Lymphocytes Relative: 13 %
Lymphs Abs: 2.3 10*3/uL (ref 0.7–4.0)
MCH: 27.7 pg (ref 26.0–34.0)
MCHC: 30.4 g/dL (ref 30.0–36.0)
MCV: 91 fL (ref 80.0–100.0)
Monocytes Absolute: 0.8 10*3/uL (ref 0.1–1.0)
Monocytes Relative: 5 %
Neutro Abs: 12.8 10*3/uL — ABNORMAL HIGH (ref 1.7–7.7)
Neutrophils Relative %: 74 %
Platelets: 269 10*3/uL (ref 150–400)
RBC: 3.79 MIL/uL — ABNORMAL LOW (ref 3.87–5.11)
RDW: 14.4 % (ref 11.5–15.5)
WBC: 17 10*3/uL — ABNORMAL HIGH (ref 4.0–10.5)
nRBC: 0 % (ref 0.0–0.2)

## 2022-10-22 LAB — COMPREHENSIVE METABOLIC PANEL
ALT: 17 U/L (ref 0–44)
AST: 21 U/L (ref 15–41)
Albumin: 3.4 g/dL — ABNORMAL LOW (ref 3.5–5.0)
Alkaline Phosphatase: 92 U/L (ref 38–126)
Anion gap: 10 (ref 5–15)
BUN: 15 mg/dL (ref 6–20)
CO2: 22 mmol/L (ref 22–32)
Calcium: 8.3 mg/dL — ABNORMAL LOW (ref 8.9–10.3)
Chloride: 105 mmol/L (ref 98–111)
Creatinine, Ser: 1.4 mg/dL — ABNORMAL HIGH (ref 0.44–1.00)
GFR, Estimated: 44 mL/min — ABNORMAL LOW (ref >60)
Glucose, Bld: 201 mg/dL — ABNORMAL HIGH (ref 70–99)
Potassium: 3.9 mmol/L (ref 3.5–5.1)
Sodium: 137 mmol/L (ref 135–145)
Total Bilirubin: 0.4 mg/dL (ref 0.3–1.2)
Total Protein: 7 g/dL (ref 6.5–8.1)

## 2022-10-22 MED ORDER — IOHEXOL 300 MG/ML  SOLN
60.0000 mL | Freq: Once | INTRAMUSCULAR | Status: AC | PRN
  Administered 2022-10-22: 60 mL via INTRAVENOUS

## 2022-10-22 NOTE — ED Triage Notes (Signed)
Pt to ED via POV with c/o cyst to R side of neck behind her ear. Pt states has been ongoing x 2 weeks, c/o pain to area. Pt with noted raised, swollen area.

## 2022-10-23 MED ORDER — OXYCODONE-ACETAMINOPHEN 5-325 MG PO TABS
2.0000 | ORAL_TABLET | Freq: Once | ORAL | Status: AC
  Administered 2022-10-23: 2 via ORAL
  Filled 2022-10-23: qty 2

## 2022-10-23 MED ORDER — CEPHALEXIN 500 MG PO CAPS: 500.0000 mg | ORAL_CAPSULE | Freq: Four times a day (QID) | ORAL | 0 refills | Status: DC

## 2022-10-23 MED ORDER — CEPHALEXIN 500 MG PO CAPS
500.0000 mg | ORAL_CAPSULE | Freq: Once | ORAL | Status: AC
  Administered 2022-10-23: 500 mg via ORAL
  Filled 2022-10-23: qty 1

## 2022-10-23 MED ORDER — SULFAMETHOXAZOLE-TRIMETHOPRIM 800-160 MG PO TABS: 2.0000 | ORAL_TABLET | Freq: Two times a day (BID) | ORAL | 0 refills | Status: DC

## 2022-10-23 MED ORDER — SULFAMETHOXAZOLE-TRIMETHOPRIM 800-160 MG PO TABS
2.0000 | ORAL_TABLET | Freq: Once | ORAL | Status: AC
  Administered 2022-10-23: 2 via ORAL
  Filled 2022-10-23: qty 2

## 2022-10-23 MED ORDER — DOCUSATE SODIUM 100 MG PO CAPS: ORAL_CAPSULE | ORAL | 0 refills | Status: DC

## 2022-10-23 MED ORDER — OXYCODONE-ACETAMINOPHEN 5-325 MG PO TABS: 2.0000 | ORAL_TABLET | Freq: Four times a day (QID) | ORAL | 0 refills | Status: DC | PRN

## 2022-10-23 NOTE — Discharge Instructions (Signed)
As we discussed, even though you have a raised area of infection, there is nothing drainable right now that could be removed through incision.  Please use warm compresses on the area and take the full course of antibiotics as prescribed, unless otherwise directed by a different physician during follow-up visits.  We recommend that you follow-up with an ENT specialist such as Dr. Pryor Ochoa.  You can call his office in the morning and explain you were seen in the emergency department overnight and requested follow-up appointment for an area of infection on the back of your head and neck.  They should be able to set you up with an appointment.  Use over-the-counter ibuprofen and Tylenol as needed for pain.  Take Percocet as prescribed for severe pain. Do not drink alcohol, drive or participate in any other potentially dangerous activities while taking this medication as it may make you sleepy. Do not take this medication with any other sedating medications, either prescription or over-the-counter. If you were prescribed Percocet or Vicodin, do not take these with acetaminophen (Tylenol) as it is already contained within these medications.   This medication is an opiate (or narcotic) pain medication and can be habit forming.  Use it as little as possible to achieve adequate pain control.  Do not use or use it with extreme caution if you have a history of opiate abuse or dependence.  If you are on a pain contract with your primary care doctor or a pain specialist, be sure to let them know you were prescribed this medication today from the [facility] Emergency Department.  This medication is intended for your use only - do not give any to anyone else and keep it in a secure place where nobody else, especially children, have access to it.  It will also cause or worsen constipation, so you may want to consider taking an over-the-counter stool softener while you are taking this medication.    Return to the  emergency department if you develop new or worsening symptoms that concern you.

## 2022-10-23 NOTE — ED Provider Notes (Signed)
[facility] Provider Note    Event Date/Time   First MD Initiated Contact with Patient 10/22/22 2348     (approximate)   History   Cyst   HPI  Emily Collins is a 57 y.o. female with history of diabetes and obesity.  She presents for evaluation of about 2 weeks of a red, painful cyst on the back of the right side of her head and the top of her neck.  She said that it was bothering her and it had some little white bumps on it so she picked at it with a needle and had her mother help her.  A few days ago after trying that a few times it got much more red and painful and began to swell, now with the redness and swelling extending further across her neck and head.  It is much more painful it was before.  It has not drained any fluid.  She has had no fever.  She has had no nausea or vomiting.  No difficulty swallowing or speaking.  She said that hurts when she moves her head and neck around but her range of motion is not limited.     Physical Exam   Triage Vital Signs: ED Triage Vitals  Enc Vitals Group     BP 10/22/22 2238 (!) 145/68     Pulse Rate 10/22/22 2238 92     Resp 10/22/22 2238 (!) 21     Temp 10/22/22 2238 98.2 F (36.8 C)     Temp Source 10/22/22 2238 Oral     SpO2 10/22/22 2238 96 %     Weight 10/22/22 2239 98.4 kg (217 lb)     Height 10/22/22 2239 1.588 m (5' 2.5")     Head Circumference --      Peak Flow --      Pain Score 10/22/22 2238 10     Pain Loc --      Pain Edu? --      Excl. in GC? --     Most recent vital signs: Vitals:   10/22/22 2238  BP: (!) 145/68  Pulse: 92  Resp: (!) 21  Temp: 98.2 F (36.8 C)  SpO2: 96%     General: Awake, no distress although she appears somewhat uncomfortable. CV:  Good peripheral perfusion.  Regular rate and rhythm Resp:  Normal effort. Speaking easily and comfortably, no accessory muscle usage nor intercostal retractions.   Abd:  No distention.  Other:  The patient has a raised  lesion consistent with a phlegmon or abscess on the right occiput near the right ear that measures about 3 cm in diameter in terms of the most indurated and erythematous area, but the erythema and slight induration extend several centimeters in diameter.  There is an area of what appears to be mild central necrosis but no purulence and no fluctuance to palpation.  It is very tender.  See photo below.    ED Results / Procedures / Treatments   Labs (all labs ordered are listed, but only abnormal results are displayed) Labs Reviewed  CBC WITH DIFFERENTIAL/PLATELET - Abnormal; Notable for the following components:      Result Value   WBC 17.0 (*)    RBC 3.79 (*)    Hemoglobin 10.5 (*)    HCT 34.5 (*)    Neutro Abs 12.8 (*)    Eosinophils Absolute 1.0 (*)    Abs Immature Granulocytes 0.08 (*)    All other  components within normal limits  COMPREHENSIVE METABOLIC PANEL - Abnormal; Notable for the following components:   Glucose, Bld 201 (*)    Creatinine, Ser 1.40 (*)    Calcium 8.3 (*)    Albumin 3.4 (*)    GFR, Estimated 44 (*)    All other components within normal limits     RADIOLOGY I viewed and interpreted the patient's CT soft tissue neck with IV contrast.  She has an obvious area of infection and inflammation consistent with the physical exam but there is no drainable fluid collection or abscess.  The radiologist confirmed that there is a phlegmon but no drainable fluid.  Extensive surrounding cellulitis.    PROCEDURES:  Critical Care performed: No  Procedures   MEDICATIONS ORDERED IN ED: Medications  iohexol (OMNIPAQUE) 300 MG/ML solution 60 mL (60 mLs Intravenous Contrast Given 10/22/22 2319)  oxyCODONE-acetaminophen (PERCOCET/ROXICET) 5-325 MG per tablet 2 tablet (2 tablets Oral Given 10/23/22 0031)  cephALEXin (KEFLEX) capsule 500 mg (500 mg Oral Given 10/23/22 0031)  sulfamethoxazole-trimethoprim (BACTRIM DS) 800-160 MG per tablet 2 tablet (2 tablets Oral Given  10/23/22 0031)     IMPRESSION / MDM / ASSESSMENT AND PLAN / ED COURSE  I reviewed the triage vital signs and the nursing notes.                              Differential diagnosis includes, but is not limited to, abscess, phlegmon, infected cyst, lipoma, cellulitis, deep neck space infection.  Patient's presentation is most consistent with acute presentation with potential threat to life or bodily function.  Labs/studies ordered: CBC with differential, CMP, CT soft tissue neck with IV contrast. Interventions/Medications given: Percocet x 2, Keflex 500 mg p.o., Bactrim DS 2 tablets p.o.  Vital signs are essentially normal.  Patient is in significant discomfort from the infection on the back of her head and I ordered 2 Percocet.  Labs are notable for leukocytosis of 17.  She has creatinine of 1.4 but this is her baseline based on her prior labs.  As documented above, although the physical exam suggests an abscess, the CT scan indicates that there is no drainable purulence or fluid.  Given the pain associated with unnecessary I&D when there is not a substantial abscess to drain, I will defer any surgical intervention.  I talked with her about this and the need to monitor carefully.  I will start her on aggressive antibiotics of Keflex 500 mg p.o. 4 times daily x 10 days and Bactrim DS 2 tablets p.o. twice daily x 10 days.  I am giving her follow-up information with ENT because they will likely be the best to address it if it develops further into an abscess or requires surgical drainage.  I gave my usual customary management recommendations and return precautions.  I verified in the [place] controlled substance database that she has no concerning prescription habits and I wrote her prescriptions for Percocet as well as for the antibiotics as described above.  She understands and agrees with the plan.      FINAL CLINICAL IMPRESSION(S) / ED DIAGNOSES   Final diagnoses:  Cellulitis of  multiple sites of head and neck  Phlegmon     Rx / DC Orders   ED Discharge Orders          Ordered    cephALEXin (KEFLEX) 500 MG capsule  4 times daily        10/23/22  5573    sulfamethoxazole-trimethoprim (BACTRIM DS) 800-160 MG tablet  2 times daily        10/23/22 0027    oxyCODONE-acetaminophen (PERCOCET) 5-325 MG tablet  Every 6 hours PRN        10/23/22 0027    docusate sodium (COLACE) 100 MG capsule        10/23/22 0027             Note:  This document was prepared using Dragon voice recognition software and may include unintentional dictation errors.   Hinda Kehr, MD 10/23/22 [phone]

## 2023-02-19 ENCOUNTER — Other Ambulatory Visit: Payer: Self-pay

## 2023-02-19 ENCOUNTER — Emergency Department: Payer: Medicaid Other

## 2023-02-19 ENCOUNTER — Emergency Department
Admission: EM | Admit: 2023-02-19 | Discharge: 2023-02-19 | Disposition: A | Discharge status: Home / Self Care | Payer: Medicaid Other | Attending: Emergency Medicine | Admitting: Emergency Medicine

## 2023-02-19 DIAGNOSIS — N189 Chronic kidney disease, unspecified: Secondary | ICD-10-CM | POA: Diagnosis not present

## 2023-02-19 DIAGNOSIS — I129 Hypertensive chronic kidney disease with stage 1 through stage 4 chronic kidney disease, or unspecified chronic kidney disease: Secondary | ICD-10-CM | POA: Diagnosis not present

## 2023-02-19 DIAGNOSIS — E1122 Type 2 diabetes mellitus with diabetic chronic kidney disease: Secondary | ICD-10-CM | POA: Diagnosis not present

## 2023-02-19 DIAGNOSIS — R600 Localized edema: Secondary | ICD-10-CM

## 2023-02-19 DIAGNOSIS — M7989 Other specified soft tissue disorders: Secondary | ICD-10-CM | POA: Diagnosis present

## 2023-02-19 DIAGNOSIS — J45909 Unspecified asthma, uncomplicated: Secondary | ICD-10-CM | POA: Diagnosis not present

## 2023-02-19 LAB — CBC WITH DIFFERENTIAL/PLATELET
Abs Immature Granulocytes: 0.06 10*3/uL (ref 0.00–0.07)
Basophils Absolute: 0.1 10*3/uL (ref 0.0–0.1)
Basophils Relative: 1 %
Eosinophils Absolute: 0.7 10*3/uL — ABNORMAL HIGH (ref 0.0–0.5)
Eosinophils Relative: 7 %
HCT: 35.1 % — ABNORMAL LOW (ref 36.0–46.0)
Hemoglobin: 10.9 g/dL — ABNORMAL LOW (ref 12.0–15.0)
Immature Granulocytes: 1 %
Lymphocytes Relative: 22 %
Lymphs Abs: 2.2 10*3/uL (ref 0.7–4.0)
MCH: 28.8 pg (ref 26.0–34.0)
MCHC: 31.1 g/dL (ref 30.0–36.0)
MCV: 92.6 fL (ref 80.0–100.0)
Monocytes Absolute: 0.4 10*3/uL (ref 0.1–1.0)
Monocytes Relative: 5 %
Neutro Abs: 6.3 10*3/uL (ref 1.7–7.7)
Neutrophils Relative %: 64 %
Platelets: 251 10*3/uL (ref 150–400)
RBC: 3.79 MIL/uL — ABNORMAL LOW (ref 3.87–5.11)
RDW: 15.4 % (ref 11.5–15.5)
WBC: 9.8 10*3/uL (ref 4.0–10.5)
nRBC: 0 % (ref 0.0–0.2)

## 2023-02-19 LAB — BASIC METABOLIC PANEL
Anion gap: 7 (ref 5–15)
BUN: 20 mg/dL (ref 6–20)
CO2: 21 mmol/L — ABNORMAL LOW (ref 22–32)
Calcium: 7.8 mg/dL — ABNORMAL LOW (ref 8.9–10.3)
Chloride: 108 mmol/L (ref 98–111)
Creatinine, Ser: 1.32 mg/dL — ABNORMAL HIGH (ref 0.44–1.00)
GFR, Estimated: 47 mL/min — ABNORMAL LOW (ref >60)
Glucose, Bld: 246 mg/dL — ABNORMAL HIGH (ref 70–99)
Potassium: 4 mmol/L (ref 3.5–5.1)
Sodium: 136 mmol/L (ref 135–145)

## 2023-02-19 LAB — MAGNESIUM: Magnesium: 2.1 mg/dL (ref 1.7–2.4)

## 2023-02-19 LAB — CK: Total CK: 292 U/L — ABNORMAL HIGH (ref 38–234)

## 2023-02-19 LAB — BRAIN NATRIURETIC PEPTIDE: B Natriuretic Peptide: 79.2 pg/mL (ref 0.0–100.0)

## 2023-02-19 LAB — TROPONIN I (HIGH SENSITIVITY): Troponin I (High Sensitivity): 8 ng/L (ref <18)

## 2023-02-19 MED ORDER — FUROSEMIDE 20 MG PO TABS: 20.0000 mg | ORAL_TABLET | Freq: Every day | ORAL | 0 refills | Status: DC

## 2023-02-19 NOTE — Discharge Instructions (Addendum)
Your blood work and ultrasounds were all reassuring.  We are not exactly sure what is causing your swelling.  Please try compression stockings and keep your legs elevated.  Start taking the lasix.  Please see your primary care doctor to have your electrolytes repeated in about 5 to 7 days after starting the Lasix.

## 2023-02-19 NOTE — ED Provider Notes (Signed)
[facility] Provider Note    Event Date/Time   First MD Initiated Contact with Patient 02/19/23 1343     (approximate)   History   Chief Complaint Leg Swelling   HPI  Emily Collins is a 57 y.o. female with past medical history of hypertension, hyperlipidemia, diabetes, CKD, asthma, and anxiety who presents to the ED complaining of of leg swelling.  Patient reports that over the past 3 days she has been dealing with increasing swelling in her left arm and left leg.  She states that she has some crampy discomfort in the left leg but denies significant pain in the arm.  She denies significant trauma to either extremity, has not noticed any redness, warmth, or wounds to the extremities.  She does reports she is feeling slightly short of breath, denies any fevers, cough, or chest pain.  She is concerned about an issue with her kidney function, states she was on a diuretic medication in the past but this was stopped due to possible effect on her kidneys.     Physical Exam   Triage Vital Signs: ED Triage Vitals  Enc Vitals Group     BP 02/19/23 1259 (!) 154/54     Pulse Rate 02/19/23 1259 93     Resp 02/19/23 1259 18     Temp 02/19/23 1259 98.1 F (36.7 C)     Temp Source 02/19/23 1259 Oral     SpO2 02/19/23 1259 96 %     Weight 02/19/23 1300 227 lb (103 kg)     Height 02/19/23 1300 5\' 3"  (1.6 m)     Head Circumference --      Peak Flow --      Pain Score 02/19/23 1259 7     Pain Loc --      Pain Edu? --      Excl. in GC? --     Most recent vital signs: Vitals:   02/19/23 1259  BP: (!) 154/54  Pulse: 93  Resp: 18  Temp: 98.1 F (36.7 C)  SpO2: 96%    Constitutional: Alert and oriented. Eyes: Conjunctivae are normal. Head: Atraumatic. Nose: No congestion/rhinnorhea. Mouth/Throat: Mucous membranes are moist.  Cardiovascular: Normal rate, regular rhythm. Grossly normal heart sounds.  2+ radial and DP pulses bilaterally. Respiratory:  Normal respiratory effort.  No retractions. Lungs CTAB. Gastrointestinal: Soft and nontender. No distention. Musculoskeletal: 1+ pitting edema to left calf with associated tenderness, no overlying erythema or warmth.  1+ pitting edema to left hand and forearm also with no overlying erythema or warmth, minimal tenderness noted. Neurologic:  Normal speech and language. No gross focal neurologic deficits are appreciated.    ED Results / Procedures / Treatments   Labs (all labs ordered are listed, but only abnormal results are displayed) Labs Reviewed  CBC WITH DIFFERENTIAL/PLATELET - Abnormal; Notable for the following components:      Result Value   RBC 3.79 (*)    Hemoglobin 10.9 (*)    HCT 35.1 (*)    Eosinophils Absolute 0.7 (*)    All other components within normal limits  BASIC METABOLIC PANEL - Abnormal; Notable for the following components:   CO2 21 (*)    Glucose, Bld 246 (*)    Creatinine, Ser 1.32 (*)    Calcium 7.8 (*)    GFR, Estimated 47 (*)    All other components within normal limits  CK - Abnormal; Notable for the following components:   Total CK  292 (*)    All other components within normal limits  MAGNESIUM  BRAIN NATRIURETIC PEPTIDE  TROPONIN I (HIGH SENSITIVITY)     EKG  ED ECG REPORT I, Chesley Noon, the attending physician, personally viewed and interpreted this ECG.   Date: 02/19/2023  EKG Time: 14:17  Rate: 73  Rhythm: normal sinus rhythm  Axis: Normal  Intervals: Prolonged QT  ST&T Change: None  RADIOLOGY Chest x-ray reviewed and interpreted by me with no infiltrate, edema, or effusion.  PROCEDURES:  Critical Care performed: No  Procedures   MEDICATIONS ORDERED IN ED: Medications - No data to display   IMPRESSION / MDM / ASSESSMENT AND PLAN / ED COURSE  I reviewed the triage vital signs and the nursing notes.                              57 y.o. female with past medical history of hypertension, hyperlipidemia, diabetes, CKD,  asthma, and GAD who presents to the ED complaining of increasing swelling in her left arm and left leg over the past couple of days with some mild difficulty breathing and discomfort in the left leg.  Patient's presentation is most consistent with acute presentation with potential threat to life or bodily function.  Differential diagnosis includes, but is not limited to, cellulitis, abscess, DVT, peripheral edema, AKI, electrolyte abnormality, CHF.  Patient well-appearing and in no acute distress, vital signs are unremarkable.  Her left arm and left leg do appear edematous with some tenderness in the leg but none in her arm.  There are no signs of infection on exam and she is neurovascular intact distally with strong radial and DP pulses.  We will further assess with ultrasound for DVT, also check chest x-ray, EKG, troponin, and BNP for evidence of CHF.  Labs thus far are reassuring with stable chronic kidney disease, no acute electrolyte abnormality, anemia, or leukocytosis noted.  EKG shows no evidence of arrhythmia or ischemia, troponin and BNP within normal limits.  Chest x-ray does not appear to show significant edema on my read.  Patient turned over to oncoming provider pending extremity ultrasound results.  If these are unremarkable, patient would be appropriate for discharge home with short course of diuretic medication and close PCP follow-up.      FINAL CLINICAL IMPRESSION(S) / ED DIAGNOSES   Final diagnoses:  Peripheral edema     Rx / DC Orders   ED Discharge Orders          Ordered    furosemide (LASIX) 20 MG tablet  Daily        02/19/23 1457             Note:  This document was prepared using Dragon voice recognition software and may include unintentional dictation errors.   Chesley Noon, MD 02/19/23 [phone]

## 2023-02-19 NOTE — ED Provider Notes (Signed)
Patient signed out to me pending ultrasound of left upper extremity left lower extremity.  Ultrasounds are negative.  Discussed reassuring findings with the patient.  Recommended compression stockings and elevation of the leg.  Previous provider has ordered Lasix outpatient.  I recommend she have her electrolytes checked in about 5 to 7 days.   Georga Hacking, MD 02/19/23 [phone]

## 2023-02-19 NOTE — ED Triage Notes (Signed)
Pt c/o left leg and left arm swelling x3 days with cramping in her leg. Pt reports being his stage 3 kidney failure.

## 2023-05-08 ENCOUNTER — Encounter (INDEPENDENT_AMBULATORY_CARE_PROVIDER_SITE_OTHER): Payer: Self-pay | Admitting: Vascular Surgery

## 2023-05-08 ENCOUNTER — Ambulatory Visit (INDEPENDENT_AMBULATORY_CARE_PROVIDER_SITE_OTHER): Payer: Medicaid Other | Admitting: Vascular Surgery

## 2023-05-08 VITALS — BP 157/69 | HR 72 | Resp 20 | Ht 62.5 in | Wt 247.2 lb

## 2023-05-08 DIAGNOSIS — N1831 Chronic kidney disease, stage 3a: Secondary | ICD-10-CM | POA: Diagnosis not present

## 2023-05-08 DIAGNOSIS — E1165 Type 2 diabetes mellitus with hyperglycemia: Secondary | ICD-10-CM

## 2023-05-08 DIAGNOSIS — I890 Lymphedema, not elsewhere classified: Secondary | ICD-10-CM

## 2023-05-08 DIAGNOSIS — M7989 Other specified soft tissue disorders: Secondary | ICD-10-CM | POA: Insufficient documentation

## 2023-05-08 NOTE — Assessment & Plan Note (Signed)
Given her previous history of worsening swelling after foot ulcer and infection and the clinical appearance of her swelling, is very consistent with lymphedema.  She also has it in her arm which would lean towards a lymphedema issue as well.  We will evaluate her for venous disease and we also discussed the other medical causes of swelling to control.  Pending the results of this workup and her clinical status, a lymphedema pump may be of benefit.

## 2023-05-08 NOTE — Progress Notes (Signed)
Patient ID: Emily Collins, female   DOB: 04/05/1966, 57 y.o.   MRN: [id number]  Chief Complaint  Patient presents with   New Patient (Initial Visit)    NP. consult. left arm/leg edema    HPI Emily Collins is a 57 y.o. female.  I am asked to see the patient by Dr. Hessie Diener for evaluation of leg and arm swelling.  The patient has had swelling for some time that has gradually worsened.  This affects her left arm and both legs but the left leg is the worst of the 2.  She denies any known history of DVT or superficial thrombophlebitis.  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  Her swelling has significantly worsened since that time.  She has had issues with her sugar control and also has chronic kidney disease stage III.  Her electrolytes have been problematic.  She denies any current open wounds or infections.  No chest pain or shortness of breath.     Past Medical History:  Diagnosis Date   Allergy    Anxiety    Arthritis    Asthma    Depression    Diabetes mellitus without complication (HCC)    GERD (gastroesophageal reflux disease)    History of chicken pox 04/07/2015   DID have Chicken Pox.     Hyperlipidemia    Hypertension    Neuromuscular disorder (HCC)    Venous (peripheral) insufficiency     Past Surgical History:  Procedure Laterality Date   APPENDECTOMY  1979   APPLICATION OF WOUND VAC Right 01/07/2021   Procedure: APPLICATION OF WOUND VAC;  Surgeon: Edwin Cap, DPM;  Location: [facility];  Service: Podiatry;  Laterality: Right;   BILATERAL CARPAL TUNNEL RELEASE Bilateral    Dr. Rosita Kea   CESAREAN SECTION  2003   GRAFT APPLICATION Right 01/07/2021   Procedure: GRAFT APPLICATION;  Surgeon: Edwin Cap, DPM;  Location: [facility];  Service: Podiatry;  Laterality: Right;   INCISION AND DRAINAGE Right 12/01/2020   Procedure: INCISION AND DRAINAGE;  Surgeon: Edwin Cap, DPM;   Location: [facility];  Service: Podiatry;  Laterality: Right;   INCISION AND DRAINAGE Right 12/07/2020   Procedure: INCISION AND DRAINAGE;  Surgeon: [name], DPM;  Location: [facility];  Service: Podiatry;  Laterality: Right;   INCISION AND DRAINAGE OF WOUND Right 01/07/2021   Procedure: IRRIGATION AND DEBRIDEMENT WOUND;  Surgeon: Edwin Cap, DPM;  Location: [facility];  Service: Podiatry;  Laterality: Right;  regional block if possible   LAPAROSCOPIC LYSIS OF ADHESIONS     SHOULDER SURGERY Right 08/2009   Dr. Rosita Kea   TUBAL LIGATION       Family History  Problem Relation Age of Onset   Hypertension Mother    Diabetes Mother    Hyperlipidemia Mother    Arthritis Mother    Heart disease Father    Kidney disease Father       Social History   Tobacco Use   Smoking status: Former    Current packs/day: 0.00    Average packs/day: 1 pack/day for 32.5 years (32.5 ttl pk-yrs)    Types: Cigarettes    Start date: 04/06/1985    Quit date: 09/25/2017    Years since quitting: 5.6   Smokeless tobacco: Never  Vaping Use   Vaping status: Never Used  Substance Use Topics   Alcohol use: Yes  Alcohol/week: 1.0 - 2.0 standard drink of alcohol    Types: 1 - 2 Glasses of wine per week    Comment: Socially   Drug use: No     Allergies  Allergen Reactions   Atorvastatin     Muscle and join pains.   Levaquin  [Levofloxacin In D5w]     GI upset   Simvastatin     joint aches.   Dilaudid  [Hydromorphone Hcl] Hives, Itching and Rash   Tetracycline Rash    Current Outpatient Medications  Medication Sig Dispense Refill   albuterol (VENTOLIN HFA) 108 (90 Base) MCG/ACT inhaler Inhale 2 puffs into the lungs every 6 (six) hours as needed for wheezing or shortness of breath. 8 g 2   amitriptyline (ELAVIL) 25 MG tablet Take 25 mg by mouth at bedtime as needed (neuropathy pain).     aspirin 81 MG tablet Take 81 mg by mouth daily.     ATROVENT HFA 17 MCG/ACT inhaler Inhale 2 puffs into the  lungs 4 (four) times daily.     benzonatate (TESSALON PERLES) 100 MG capsule Take 1 capsule (100 mg total) by mouth 3 (three) times daily as needed for cough. 30 capsule 0   cephALEXin (KEFLEX) 500 MG capsule Take 1 capsule (500 mg total) by mouth 4 (four) times daily. 40 capsule 0   cetirizine (ZYRTEC) 10 MG tablet TAKE 1 TABLET(10 MG) BY MOUTH DAILY (Patient taking differently: Take 10 mg by mouth daily.) 90 tablet 1   Continuous Glucose Sensor (DEXCOM G6 SENSOR) MISC SMARTSIG:Topical Every 10 Days     Continuous Glucose Transmitter (DEXCOM G6 TRANSMITTER) MISC USE TO MONITOR BLOOD SUGAR. REPLACE EVERY 3 MONTHS     cyclobenzaprine (FLEXERIL) 5 MG tablet Take 5 mg by mouth at bedtime as needed for muscle spasms.     docusate sodium (COLACE) 100 MG capsule Take 1 tablet once or twice daily as needed for constipation while taking narcotic pain medicine 30 capsule 0   famotidine (PEPCID) 40 MG tablet Take 40 mg by mouth daily.     ferrous sulfate 325 (65 FE) MG tablet Take 325 mg by mouth 2 (two) times daily with a meal.     FLUoxetine (PROZAC) 20 MG capsule Take 1 capsule (20 mg total) by mouth daily. (Patient taking differently: Take 40 mg by mouth daily.)     furosemide (LASIX) 20 MG tablet Take 1 tablet (20 mg total) by mouth daily. 5 tablet 0   gabapentin (NEURONTIN) 300 MG capsule Take 2 capsules (600 mg total) by mouth 3 (three) times daily. 90 capsule 0   HUMALOG KWIKPEN 100 UNIT/ML KwikPen Inject 5 Units into the skin 3 (three) times daily with meals. INJ UP TO 100 UNI  PER DAY AS PER MD INSTRUCTIONS 15 mL 12   hydrochlorothiazide (HYDRODIURIL) 12.5 MG tablet Take 12.5 mg by mouth daily.     hydrOXYzine (ATARAX/VISTARIL) 10 MG tablet Take 10 mg by mouth 3 (three) times daily as needed for itching or anxiety.     LEVEMIR FLEXTOUCH 100 UNIT/ML FlexPen Inject 40 Units into the skin daily. (Patient taking differently: Inject 40-60 Units into the skin 2 (two) times daily.) 15 mL 11   Melatonin  10 MG TABS Take 10 mg by mouth at bedtime as needed (sleep).     meloxicam (MOBIC) 15 MG tablet Take 15 mg by mouth daily.     mupirocin ointment (BACTROBAN) 2 % Apply 1 application topically daily. 15 g 2   omeprazole (  PRILOSEC) 40 MG capsule Take 40 mg by mouth 2 (two) times daily.     oxyCODONE-acetaminophen (PERCOCET) 5-325 MG tablet Take 2 tablets by mouth every 6 (six) hours as needed for severe pain. 16 tablet 0   polyethylene glycol (MIRALAX / GLYCOLAX) 17 g packet Take 17 g by mouth daily. 14 each 0   sulfamethoxazole-trimethoprim (BACTRIM DS) 800-160 MG tablet Take 2 tablets by mouth 2 (two) times daily. 40 tablet 0   TRULICITY 3 MG/0.5ML SOPN Inject 3 mg into the skin every Saturday.     No current facility-administered medications for this visit.      REVIEW OF SYSTEMS (Negative unless checked)  Constitutional: [] Weight loss  [] Fever  [] Chills Cardiac: [] Chest pain   [] Chest pressure   [] Palpitations   [] Shortness of breath when laying flat   [] Shortness of breath at rest   [] Shortness of breath with exertion. Vascular:  [x] Pain in legs with walking   [x] Pain in legs at rest   [] Pain in legs when laying flat   [] Claudication   [] Pain in feet when walking  [] Pain in feet at rest  [] Pain in feet when laying flat   [] History of DVT   [] Phlebitis   [x] Swelling in legs   [] Varicose veins   [] Non-healing ulcers Pulmonary:   [] Uses home oxygen   [] Productive cough   [] Hemoptysis   [] Wheeze  [] COPD   [x] Asthma Neurologic:  [] Dizziness  [] Blackouts   [] Seizures   [] History of stroke   [] History of TIA  [] Aphasia   [] Temporary blindness   [] Dysphagia   [] Weakness or numbness in arms   [] Weakness or numbness in legs Musculoskeletal:  [x] Arthritis   [] Joint swelling   [] Joint pain   [] Low back pain Hematologic:  [] Easy bruising  [] Easy bleeding   [] Hypercoagulable state   [] Anemic  [] Hepatitis Gastrointestinal:  [] Blood in stool   [] Vomiting blood  [x] Gastroesophageal reflux/heartburn    [] Abdominal pain Genitourinary:  [] Chronic kidney disease   [] Difficult urination  [] Frequent urination  [] Burning with urination   [] Hematuria Skin:  [] Rashes   [] Ulcers   [] Wounds Psychological:  [x] History of anxiety   [x]  History of major depression.    Physical Exam BP (!) 157/69 (BP Location: Left Arm)   Pulse 72   Resp 20   Ht 5' 2.5" (1.588 m)   Wt 247 lb 3.2 oz (112.1 kg)   BMI 44.49 kg/m  Gen:  WD/WN, NAD. Obese Head: [place]/AT, No temporalis wasting.  Ear/Nose/Throat: Hearing grossly intact, nares w/o erythema or drainage, oropharynx w/o Erythema/Exudate Eyes: Conjunctiva clear, sclera non-icteric  Neck: trachea midline.  No JVD.  Pulmonary:  Good air movement, respirations not labored, no use of accessory muscles  Cardiac: RRR, no JVD Vascular:  Vessel Right Left  Radial Palpable Palpable                          DP NP NP  PT NP NP   Gastrointestinal:. No masses, surgical incisions, or scars. Musculoskeletal: M/S 5/5 throughout.  Extremities without ischemic changes.  No deformity or atrophy. 2+ RLE edema, 2-3+ LLE edema. 1-2+ LUE edema as well. Neurologic: Sensation grossly intact in extremities.  Symmetrical.  Speech is fluent. Motor exam as listed above. Psychiatric: Judgment intact, Mood & affect appropriate for pt's clinical situation. Dermatologic: No rashes or ulcers noted.  No cellulitis or open wounds.    Radiology No results found.  Labs Recent Results (from the past 2160 hour(s))  CBC  with Differential     Status: Abnormal   Collection Time: 02/19/23  1:02 PM  Result Value Ref Range   WBC 9.8 4.0 - 10.5 K/uL   RBC 3.79 (L) 3.87 - 5.11 MIL/uL   Hemoglobin 10.9 (L) 12.0 - 15.0 g/dL   HCT 40.9 (L) 81.1 - 91.4 %   MCV 92.6 80.0 - 100.0 fL   MCH 28.8 26.0 - 34.0 pg   MCHC 31.1 30.0 - 36.0 g/dL   RDW 78.2 95.6 - 21.3 %   Platelets 251 150 - 400 K/uL   nRBC 0.0 0.0 - 0.2 %   Neutrophils Relative % 64 %   Neutro Abs 6.3 1.7 - 7.7 K/uL    Lymphocytes Relative 22 %   Lymphs Abs 2.2 0.7 - 4.0 K/uL   Monocytes Relative 5 %   Monocytes Absolute 0.4 0.1 - 1.0 K/uL   Eosinophils Relative 7 %   Eosinophils Absolute 0.7 (H) 0.0 - 0.5 K/uL   Basophils Relative 1 %   Basophils Absolute 0.1 0.0 - 0.1 K/uL   Immature Granulocytes 1 %   Abs Immature Granulocytes 0.06 0.00 - 0.07 K/uL    Comment: Performed at [facility], [address] [place], [place] [address]  Basic metabolic panel     Status: Abnormal   Collection Time: 02/19/23  1:02 PM  Result Value Ref Range   Sodium 136 135 - 145 mmol/L   Potassium 4.0 3.5 - 5.1 mmol/L   Chloride 108 98 - 111 mmol/L   CO2 21 (L) 22 - 32 mmol/L   Glucose, Bld 246 (H) 70 - 99 mg/dL    Comment: Glucose reference range applies only to samples taken after fasting for at least 8 hours.   BUN 20 6 - 20 mg/dL   Creatinine, Ser 8.46 (H) 0.44 - 1.00 mg/dL   Calcium 7.8 (L) 8.9 - 10.3 mg/dL   GFR, Estimated 47 (L) >60 mL/min    Comment: (NOTE) Calculated using the CKD-EPI Creatinine Equation (2021)    Anion gap 7 5 - 15    Comment: Performed at [facility], [address] [place], [place] [address]  Magnesium     Status: None   Collection Time: 02/19/23  1:03 PM  Result Value Ref Range   Magnesium 2.1 1.7 - 2.4 mg/dL    Comment: Performed at [facility], [address] [place], [place] [address]  CK     Status: Abnormal   Collection Time: 02/19/23  1:03 PM  Result Value Ref Range   Total CK 292 (H) 38 - 234 U/L    Comment: Performed at [facility], [address] [place], [place] [address]  Troponin I (High Sensitivity)     Status: None   Collection Time: 02/19/23  1:03 PM  Result Value Ref Range   Troponin I (High Sensitivity) 8 <18 ng/L    Comment: (NOTE) Elevated high sensitivity troponin I (hsTnI) values and significant  changes across serial measurements may suggest ACS but many other  chronic and acute conditions are known to elevate  hsTnI results.  Refer to the "Links" section for chest pain algorithms and additional  guidance. Performed at [facility], [address] [place], [place] [address]   Brain natriuretic peptide     Status: None   Collection Time: 02/19/23  1:03 PM  Result Value Ref Range   B Natriuretic Peptide 79.2 0.0 - 100.0 pg/mL    Comment: Performed at [employer]  [facility], [address] [place], [place] [address]    Assessment/Plan:  Swelling of limb Recommend:  I have had a long discussion with the patient regarding swelling and why it  causes symptoms.  Patient will begin wearing graduated compression on a daily basis a prescription was given. The patient will  wear the stockings first thing in the morning and removing them in the evening. The patient is instructed specifically not to sleep in the stockings.   In addition, behavioral modification will be initiated.  This will include frequent elevation, use of over the counter pain medications and exercise such as walking.  Consideration for a lymph pump will also be made based upon the effectiveness of conservative therapy.  This would help to improve the edema control and prevent sequela such as ulcers and infections   Patient should undergo duplex ultrasound of the venous system to ensure that DVT or reflux is not present.  The patient will follow-up with me after the ultrasound.   Obesity, Class III, BMI 40-49.9 (morbid obesity) (HCC) Worsens leg swelling.  CKD (chronic kidney disease), stage IIIa Renal insufficiency often contributes to lower extremity swelling.  Uncontrolled type 2 diabetes mellitus with hyperglycemia (HCC) blood glucose control important in reducing the progression of atherosclerotic disease. Also, involved in wound healing. On appropriate medications.   Lymphedema Given her previous history of worsening swelling after foot ulcer and infection and the clinical appearance of her swelling, is very  consistent with lymphedema.  She also has it in her arm which would lean towards a lymphedema issue as well.  We will evaluate her for venous disease and we also discussed the other medical causes of swelling to control.  Pending the results of this workup and her clinical status, a lymphedema pump may be of benefit.      Festus Barren 05/08/2023, 1:12 PM   This note was created with Dragon medical transcription system.  Any errors from dictation are unintentional.

## 2023-05-08 NOTE — Assessment & Plan Note (Signed)
Renal insufficiency often contributes to lower extremity swelling.

## 2023-05-08 NOTE — Assessment & Plan Note (Signed)

## 2023-05-08 NOTE — Assessment & Plan Note (Signed)
Worsens leg swelling.

## 2023-05-08 NOTE — Assessment & Plan Note (Signed)
blood glucose control important in reducing the progression of atherosclerotic disease. Also, involved in wound healing. On appropriate medications.  

## 2023-05-08 NOTE — Patient Instructions (Signed)
 Lymphedema  Lymphedema is swelling that happens when an abnormal amount of lymph collects in the soft tissues under your skin. Lymph is fluid that moves through your lymphatic system. This system: Is part of your body's defense system, also called your immune system. Filters germs and waste from tissues in your body to your bloodstream. Lymphedema happens when your lymphatic system is blocked. This keeps lymph from draining as it should and leads to swelling. What are the causes? The cause of lymphedema depends on which type you have. Primary lymphedema is when you're born without lymph vessels or with lymph vessels that aren't normal. Secondary lymphedema is more common. It happens when lymph vessels are blocked or damaged from: Infection. Injury. Radiation therapy. Cancer. Scar tissue that forms. Surgery. What are the signs or symptoms? A swollen arm, leg, feet, toes, or fingers. A heavy or tight feeling in the swollen area. Skin that turns red near the swollen area. Not being able to move your arm or leg. Your arm or leg is sensitive to touch. Discomfort in your arm or leg. How is this diagnosed? Lymphedema may be diagnosed based on: Your symptoms and medical history. A physical exam. Bioimpedance spectroscopy. This test uses painless electrical currents. They help measure fluid levels in your body. Imaging tests, such as: MRI or CT scan. Duplex ultrasound. This test uses sound waves to make pictures on a screen. The pictures show your blood vessels and blood flow. Lymphoscintigraphy. In this test, a low dose of radioactive substance is given through a needle that goes through your skin. The substance traces the flow of lymph through your lymph vessels. Lymphangiography. In this test, a contrast dye is put into your lymph vessel. The dye helps show if the vessel is blocked. How is this treated?  If another condition is causing your lymphedema, that condition will be treated.  For example, antibiotics may be used to treat infection. Treatment for lymphedema depends on the cause. Treatment may include: Complete decongestive therapy (CDT). This lowers fluid buildup. CDT includes: Pressure (compression) wrapping of the area. Manual lymph drainage. This helps lymph drain out of your arm or leg. Certain exercises. These help fluid move out of your arm or leg. Compression. This puts pressure on your arm or leg to lower swelling. It includes: Compression stockings or sleeves. Special bandage wraps. Surgery. This is normally done only for severe cases that don't get better with other treatments. Follow these instructions at home: Self-care Your swollen area is more likely to get hurt or infected. To help prevent infection: Keep the area clean and dry. Use creams or lotions that your health care provider says are okay. These keep your skin moist. Protect your skin from cuts. Use gloves when you cook or garden. Do not walk barefoot. If you shave the area, use an Neurosurgeon. Do not wear tight clothes, shoes, or jewelry. Eat a healthy diet. Eat a lot of fruits and vegetables. Activity Do exercises as told by your provider. Do not sit with your legs crossed. When you can, keep the swollen leg or foot raised above the level of your heart. Avoid using an arm with lymphedema to carry things. General instructions Wear compression stockings or sleeves as told by your provider. Note any changes in size of the swollen arm or leg. You may be told to measure it at set times and track the results. Take over-the-counter and prescription medicines only as told by your provider. If you were prescribed antibiotics, use them  as told by your provider. Do not stop using the antibiotic even if you start to feel better or if your condition improves. Do not use heating pads or ice packs on the swollen area. Avoid having your swollen arm or leg used for: Blood draws. IVs. Blood  pressure checks. Contact a health care provider if: You get new swelling in your arm or leg all of a sudden. Fluid leaks from the skin of your swollen arm or leg. You have a cut that doesn't heal. The swollen area hurts or turns red. You get purple spots, a rash, blisters, or sores on your swollen arm or leg. You have a fever or chills. This information is not intended to replace advice given to you by your health care provider. Make sure you discuss any questions you have with your health care provider. Document Revised: 12/06/2022 Document Reviewed: 12/06/2022 Elsevier Patient Education  2024 [employer].

## 2023-06-20 ENCOUNTER — Ambulatory Visit (INDEPENDENT_AMBULATORY_CARE_PROVIDER_SITE_OTHER): Payer: Medicaid Other | Admitting: Nurse Practitioner

## 2023-06-20 ENCOUNTER — Encounter (INDEPENDENT_AMBULATORY_CARE_PROVIDER_SITE_OTHER): Payer: Medicaid Other

## 2023-07-11 ENCOUNTER — Encounter: Payer: Self-pay | Admitting: *Deleted

## 2023-07-12 ENCOUNTER — Encounter: Payer: Self-pay | Admitting: Cardiology

## 2023-07-12 ENCOUNTER — Ambulatory Visit: Payer: Medicaid Other | Attending: Cardiology | Admitting: Cardiology

## 2023-07-12 VITALS — BP 112/62 | HR 74 | Ht 62.0 in | Wt 244.6 lb

## 2023-07-12 DIAGNOSIS — E782 Mixed hyperlipidemia: Secondary | ICD-10-CM | POA: Diagnosis not present

## 2023-07-12 DIAGNOSIS — Z79899 Other long term (current) drug therapy: Secondary | ICD-10-CM

## 2023-07-12 DIAGNOSIS — R609 Edema, unspecified: Secondary | ICD-10-CM | POA: Diagnosis not present

## 2023-07-12 DIAGNOSIS — R0600 Dyspnea, unspecified: Secondary | ICD-10-CM | POA: Diagnosis not present

## 2023-07-12 MED ORDER — FUROSEMIDE 20 MG PO TABS: 20.0000 mg | ORAL_TABLET | Freq: Every day | ORAL | 3 refills | Status: AC

## 2023-07-12 MED ORDER — EZETIMIBE 10 MG PO TABS: 10.0000 mg | ORAL_TABLET | Freq: Every day | ORAL | 3 refills | Status: AC

## 2023-07-12 NOTE — Patient Instructions (Signed)
Medication Instructions:  Your physician recommends the following medication changes.  STOP TAKING: Hydrochlorothiazide 12.5 Mg daily Lipitor   START TAKING: Lasix 20 MG daily Zetia 10 MG daily    *If you need a refill on your cardiac medications before your next appointment, please call your pharmacy*   Lab Work: Your provider would like for you to return in 10 days to have the following labs drawn: BMP.   Please go to [facility] [address] ([facility] [address], [place] [address] You do not need an appointment.  They are open from 7:30 am-4 pm.  Lunch from 1:00 pm- 2:00 pm You will not need to be fasting.       If you have labs (blood work) drawn today and your tests are completely normal, you will receive your results only by: MyChart Message (if you have MyChart) OR A paper copy in the mail If you have any lab test that is abnormal or we need to change your treatment, we will call you to review the results.   Testing/Procedures: Your physician has requested that you have an echocardiogram. Echocardiography is a painless test that uses sound waves to create images of your heart. It provides your doctor with information about the size and shape of your heart and how well your heart's chambers and valves are working.   You may receive an ultrasound enhancing agent through an IV if needed to better visualize your heart during the echo. This procedure takes approximately one hour.  There are no restrictions for this procedure.  This will take place at [address] ([facility]) [address], [place] [address]    Follow-Up: At [facility], you and your health needs are our priority.  As part of our continuing mission to provide you with exceptional heart care, we have created designated Provider Care Teams.  These Care Teams include your primary Cardiologist (physician) and Advanced Practice Providers (APPs -  Physician  Assistants and Nurse Practitioners) who all work together to provide you with the care you need, when you need it.  We recommend signing up for the patient portal called "MyChart".  Sign up information is provided on this After Visit Summary.  MyChart is used to connect with patients for Virtual Visits (Telemedicine).  Patients are able to view lab/test results, encounter notes, upcoming appointments, etc.  Non-urgent messages can be sent to your provider as well.   To learn more about what you can do with MyChart, go to [email or web address].    Your next appointment:   6 week(s)  Provider:   Debbe Odea, MD

## 2023-07-12 NOTE — Progress Notes (Signed)
Cardiology Office Note:    Date:  07/12/2023   ID:  Emily Collins, DOB December 28, 1965, MRN [id number]  PCP:  Emily Conroy, MD   [facility] Providers Cardiologist:  None     Referring MD: Emily Conroy, MD   Chief Complaint  Patient presents with   New Patient (Initial Visit)    Referred for cardiac evaluation of edema of bilateral extremities L>R.  Previously seen by cardiology more than 10 years ago.  PCP/referral note scanned in media.    History of Present Illness:    Emily Collins is a 57 y.o. female with a hx of hypertension, hyperlipidemia, diabetes, former smoker x 15 years who presents due to leg edema.  States having symptoms of shortness of breath, leg edema ongoing over the past 6 months.  Denies chest pain, endorses snoring, daytime fatigue, somnolence.  States having muscle aches with statins, switch to Lipitor, still having muscle aches.  Started on HCTZ 12.5 mg daily, leg edema persists.  History of MI in father around age 78.  Recently started on Wegovy by PCP, plans to pick up medication today.  Past Medical History:  Diagnosis Date   Allergy    Anxiety    Arthritis    Asthma    Depression    Diabetes mellitus without complication (HCC)    GERD (gastroesophageal reflux disease)    History of chicken pox 04/07/2015   DID have Chicken Pox.     Hyperlipidemia    Hypertension    Neuromuscular disorder (HCC)    Venous (peripheral) insufficiency     Past Surgical History:  Procedure Laterality Date   APPENDECTOMY  1979   APPLICATION OF WOUND VAC Right 01/07/2021   Procedure: APPLICATION OF WOUND VAC;  Surgeon: Edwin Cap, DPM;  Location: [facility] ORS;  Service: Podiatry;  Laterality: Right;   BILATERAL CARPAL TUNNEL RELEASE Bilateral    Dr. Rosita Kea   CESAREAN SECTION  2003   GRAFT APPLICATION Right 01/07/2021   Procedure: GRAFT APPLICATION;  Surgeon: Edwin Cap, DPM;  Location: [facility];  Service: Podiatry;  Laterality:  Right;   INCISION AND DRAINAGE Right 12/01/2020   Procedure: INCISION AND DRAINAGE;  Surgeon: Edwin Cap, DPM;  Location: [facility];  Service: Podiatry;  Laterality: Right;   INCISION AND DRAINAGE Right 12/07/2020   Procedure: INCISION AND DRAINAGE;  Surgeon: [name], DPM;  Location: [facility];  Service: Podiatry;  Laterality: Right;   INCISION AND DRAINAGE OF WOUND Right 01/07/2021   Procedure: IRRIGATION AND DEBRIDEMENT WOUND;  Surgeon: Edwin Cap, DPM;  Location: [facility];  Service: Podiatry;  Laterality: Right;  regional block if possible   LAPAROSCOPIC LYSIS OF ADHESIONS     SHOULDER SURGERY Right 08/2009   Dr. Rosita Kea   TUBAL LIGATION      Current Medications: Current Meds  Medication Sig   albuterol (VENTOLIN HFA) 108 (90 Base) MCG/ACT inhaler Inhale 2 puffs into the lungs every 6 (six) hours as needed for wheezing or shortness of breath.   amitriptyline (ELAVIL) 25 MG tablet Take 25 mg by mouth at bedtime as needed (neuropathy pain).   aspirin 81 MG tablet Take 81 mg by mouth daily.   ATROVENT HFA 17 MCG/ACT inhaler Inhale 2 puffs into the lungs 4 (four) times daily.   cetirizine (ZYRTEC) 10 MG tablet TAKE 1 TABLET(10 MG) BY MOUTH DAILY (Patient taking differently: Take 10 mg by mouth daily.)   Continuous Glucose Sensor (DEXCOM G6 SENSOR)  MISC SMARTSIG:Topical Every 10 Days   Continuous Glucose Transmitter (DEXCOM G6 TRANSMITTER) MISC USE TO MONITOR BLOOD SUGAR. REPLACE EVERY 3 MONTHS   cyclobenzaprine (FLEXERIL) 5 MG tablet Take 5 mg by mouth at bedtime as needed for muscle spasms.   docusate sodium (COLACE) 100 MG capsule Take 1 tablet once or twice daily as needed for constipation while taking narcotic pain medicine   ezetimibe (ZETIA) 10 MG tablet Take 1 tablet (10 mg total) by mouth daily.   famotidine (PEPCID) 40 MG tablet Take 40 mg by mouth daily.   ferrous sulfate 325 (65 FE) MG tablet Take 325 mg by mouth 2 (two) times daily with a meal.   FLUoxetine (PROZAC)  20 MG capsule Take 1 capsule (20 mg total) by mouth daily. (Patient taking differently: Take 40 mg by mouth daily.)   furosemide (LASIX) 20 MG tablet Take 1 tablet (20 mg total) by mouth daily.   gabapentin (NEURONTIN) 300 MG capsule Take 2 capsules (600 mg total) by mouth 3 (three) times daily.   HUMALOG KWIKPEN 100 UNIT/ML KwikPen Inject 5 Units into the skin 3 (three) times daily with meals. INJ UP TO 100 UNI [place] PER DAY AS PER MD INSTRUCTIONS   hydrOXYzine (ATARAX/VISTARIL) 10 MG tablet Take 10 mg by mouth 3 (three) times daily as needed for itching or anxiety.   Melatonin 10 MG TABS Take 10 mg by mouth at bedtime as needed (sleep).   meloxicam (MOBIC) 15 MG tablet Take 15 mg by mouth daily.   mupirocin ointment (BACTROBAN) 2 % Apply 1 application topically daily.   omeprazole (PRILOSEC) 40 MG capsule Take 40 mg by mouth 2 (two) times daily.   [DISCONTINUED] hydrochlorothiazide (HYDRODIURIL) 12.5 MG tablet Take 12.5 mg by mouth daily.     Allergies:   Atorvastatin, Levaquin  [levofloxacin in d5w], Simvastatin, Dilaudid  [hydromorphone hcl], and Tetracycline   Social History   Socioeconomic History   Marital status: Married    Spouse name: Emily Collins   Number of children: 3   Years of education: H/S   Highest education level: Not on file  Occupational History   Occupation: Housewife  Tobacco Use   Smoking status: Former    Current packs/day: 0.00    Average packs/day: 1 pack/day for 32.5 years (32.5 ttl pk-yrs)    Types: Cigarettes    Start date: 04/06/1985    Quit date: 09/25/2017    Years since quitting: 5.7   Smokeless tobacco: Never  Vaping Use   Vaping status: Never Used  Substance and Sexual Activity   Alcohol use: Yes    Alcohol/week: 1.0 - 2.0 standard drink of alcohol    Types: 1 - 2 Glasses of wine per week    Comment: Socially   Drug use: No   Sexual activity: Not on file  Other Topics Concern   Not on file  Social History Narrative   Not on file   Social  Determinants of Health   Financial Resource Strain: Not on file  Food Insecurity: Not on file  Transportation Needs: Not on file  Physical Activity: Not on file  Stress: Not on file  Social Connections: Not on file     Family History: The patient's family history includes Arthritis in her mother; Diabetes in her mother; Heart disease in her father; Hyperlipidemia in her mother; Hypertension in her mother; Kidney disease in her father.  ROS:   Please see the history of present illness.     All other systems reviewed  and are negative.  EKGs/Labs/Other Studies Reviewed:    The following studies were reviewed today:  EKG Interpretation Date/Time:  Thursday July 12 2023 11:59:49 EDT Ventricular Rate:  74 PR Interval:  170 QRS Duration:  90 QT Interval:  436 QTC Calculation: 483 R Axis:   33  Text Interpretation: Normal sinus rhythm Prolonged QT Confirmed by Debbe Odea ([id number]) on 07/12/2023 12:08:32 PM    Recent Labs: 10/22/2022: ALT 17 02/19/2023: B Natriuretic Peptide 79.2; BUN 20; Creatinine, Ser 1.32; Hemoglobin 10.9; Magnesium 2.1; Platelets 251; Potassium 4.0; Sodium 136  Recent Lipid Panel    Component Value Date/Time   CHOL 193 08/29/2016 0915   TRIG 163 (H) 08/29/2016 0915   HDL 39 (L) 08/29/2016 0915   CHOLHDL 4.9 (H) 08/29/2016 0915   LDLCALC 121 (H) 08/29/2016 0915     Risk Assessment/Calculations:             Physical Exam:    VS:  BP 112/62 (BP Location: Left Arm, Patient Position: Sitting, Cuff Size: Large)   Pulse 74   Ht 5\' 2"  (1.575 m)   Wt 244 lb 9.6 oz (110.9 kg)   SpO2 97%   BMI 44.74 kg/m     Wt Readings from Last 3 Encounters:  07/12/23 244 lb 9.6 oz (110.9 kg)  05/08/23 247 lb 3.2 oz (112.1 kg)  02/19/23 227 lb (103 kg)     GEN:  Well nourished, well developed in no acute distress HEENT: Normal NECK: No JVD; No carotid bruits CARDIAC: RRR, no murmurs, rubs, gallops RESPIRATORY:  Clear to auscultation without rales,  wheezing or rhonchi  ABDOMEN: Soft, non-tender, non-distended MUSCULOSKELETAL:  No edema; No deformity  SKIN: Warm and dry NEUROLOGIC:  Alert and oriented x 3 PSYCHIATRIC:  Normal affect   ASSESSMENT:    1. Edema, unspecified type   2. Dyspnea, unspecified type   3. Mixed hyperlipidemia   4. Morbid obesity (HCC)   5. Medication management    PLAN:    In order of problems listed above:  Bilateral leg edema, worse on left.  Lower extremity ultrasound 5/24 with no DVT.  Obtain echocardiogram, start Lasix 20 mg daily, check BMP in 10 days. Shortness of breath, obtain echo as above.  Etiology likely due to morbid obesity, sleep apnea.  Plan for referral to sleep specialist at follow-up visit for sleep study. Hyperlipidemia, statin intolerance.  Stop Lipitor, start Zetia 10 mg daily. Morbid obesity, also diabetic.  Agree with Wegovy as per PCP.  Follow-up after echo.      Medication Adjustments/Labs and Tests Ordered: Current medicines are reviewed at length with the patient today.  Concerns regarding medicines are outlined above.  Orders Placed This Encounter  Procedures   Basic Metabolic Panel (BMET)   EKG 12-Lead   ECHOCARDIOGRAM COMPLETE   Meds ordered this encounter  Medications   furosemide (LASIX) 20 MG tablet    Sig: Take 1 tablet (20 mg total) by mouth daily.    Dispense:  90 tablet    Refill:  3   ezetimibe (ZETIA) 10 MG tablet    Sig: Take 1 tablet (10 mg total) by mouth daily.    Dispense:  90 tablet    Refill:  3    Patient Instructions  Medication Instructions:  Your physician recommends the following medication changes.  STOP TAKING: Hydrochlorothiazide 12.5 Mg daily Lipitor   START TAKING: Lasix 20 MG daily Zetia 10 MG daily    *If you need a refill on your  cardiac medications before your next appointment, please call your pharmacy*   Lab Work: Your provider would like for you to return in 10 days to have the following labs drawn: BMP.    Please go to [facility] [address] ([facility] [address], [place] [address] You do not need an appointment.  They are open from 7:30 am-4 pm.  Lunch from 1:00 pm- 2:00 pm You will not need to be fasting.       If you have labs (blood work) drawn today and your tests are completely normal, you will receive your results only by: MyChart Message (if you have MyChart) OR A paper copy in the mail If you have any lab test that is abnormal or we need to change your treatment, we will call you to review the results.   Testing/Procedures: Your physician has requested that you have an echocardiogram. Echocardiography is a painless test that uses sound waves to create images of your heart. It provides your doctor with information about the size and shape of your heart and how well your heart's chambers and valves are working.   You may receive an ultrasound enhancing agent through an IV if needed to better visualize your heart during the echo. This procedure takes approximately one hour.  There are no restrictions for this procedure.  This will take place at [address] ([facility]) [address], [place] [address]    Follow-Up: At [facility], you and your health needs are our priority.  As part of our continuing mission to provide you with exceptional heart care, we have created designated Provider Care Teams.  These Care Teams include your primary Cardiologist (physician) and Advanced Practice Providers (APPs -  Physician Assistants and Nurse Practitioners) who all work together to provide you with the care you need, when you need it.  We recommend signing up for the patient portal called "MyChart".  Sign up information is provided on this After Visit Summary.  MyChart is used to connect with patients for Virtual Visits (Telemedicine).  Patients are able to view lab/test results, encounter notes, upcoming appointments, etc.  Non-urgent messages  can be sent to your provider as well.   To learn more about what you can do with MyChart, go to [email or web address].    Your next appointment:   6 week(s)  Provider:   Debbe Odea, MD        Signed, Debbe Odea, MD  07/12/2023 1:03 PM    [facility]

## 2023-07-27 ENCOUNTER — Ambulatory Visit (INDEPENDENT_AMBULATORY_CARE_PROVIDER_SITE_OTHER): Payer: Medicaid Other

## 2023-07-27 ENCOUNTER — Ambulatory Visit (INDEPENDENT_AMBULATORY_CARE_PROVIDER_SITE_OTHER): Payer: Medicaid Other | Admitting: Nurse Practitioner

## 2023-07-27 ENCOUNTER — Encounter (INDEPENDENT_AMBULATORY_CARE_PROVIDER_SITE_OTHER): Payer: Self-pay | Admitting: Nurse Practitioner

## 2023-07-27 VITALS — BP 140/73 | HR 83 | Resp 18 | Ht 62.0 in | Wt 238.2 lb

## 2023-07-27 DIAGNOSIS — I890 Lymphedema, not elsewhere classified: Secondary | ICD-10-CM | POA: Diagnosis not present

## 2023-07-27 DIAGNOSIS — M7989 Other specified soft tissue disorders: Secondary | ICD-10-CM | POA: Diagnosis not present

## 2023-07-27 DIAGNOSIS — N1831 Chronic kidney disease, stage 3a: Secondary | ICD-10-CM | POA: Diagnosis not present

## 2023-07-28 NOTE — Progress Notes (Signed)
Subjective:    Patient ID: Emily Collins, female    DOB: 1966/05/07, 57 y.o.   MRN: [id number] Chief Complaint  Patient presents with   Follow-up    pt conv BIL reflux    Emily Collins is a 57 y.o. female.  I am asked to see the patient by Dr. Hessie Diener for evaluation of leg and arm swelling.  The patient has had swelling for some time that has gradually worsened.  This affects her left arm and both legs but the left leg is the worst of the 2.  She denies any known history of DVT or superficial thrombophlebitis.  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  Her swelling has significantly worsened since that time.  She has had issues with her sugar control and also has chronic kidney disease stage III.  Her electrolytes have been problematic.  She denies any current open wounds or infections.  No chest pain or shortness of breath.    Today noninvasive study showed no evidence of DVT or superficial thrombophlebitis bilaterally.  No evidence of deep venous insufficiency bilaterally or superficial venous reflux bilaterally.    Review of Systems  Cardiovascular:  Positive for leg swelling.  All other systems reviewed and are negative.      Objective:   Physical Exam Vitals reviewed.  HENT:     Head: Normocephalic.  Cardiovascular:     Rate and Rhythm: Normal rate.  Pulmonary:     Effort: Pulmonary effort is normal.  Skin:    General: Skin is warm and dry.  Neurological:     Mental Status: She is alert and oriented to person, place, and time.  Psychiatric:        Mood and Affect: Mood normal.        Behavior: Behavior normal.        Thought Content: Thought content normal.        Judgment: Judgment normal.     BP (!) 140/73 (BP Location: Left Arm)   Pulse 83   Resp 18   Ht 5\' 2"  (1.575 m)   Wt 238 lb 3.2 oz (108 kg)   LMP 07/15/2018 (Approximate)   BMI 43.57 kg/m   Past Medical History:   Diagnosis Date   Allergy    Anxiety    Arthritis    Asthma    Depression    Diabetes mellitus without complication (HCC)    GERD (gastroesophageal reflux disease)    History of chicken pox 04/07/2015   DID have Chicken Pox.     Hyperlipidemia    Hypertension    Neuromuscular disorder (HCC)    Venous (peripheral) insufficiency     Social History   Socioeconomic History   Marital status: Married    Spouse name: Emily Collins   Number of children: 3   Years of education: H/S   Highest education level: Not on file  Occupational History   Occupation: Housewife  Tobacco Use   Smoking status: Former    Current packs/day: 0.00    Average packs/day: 1 pack/day for 32.5 years (32.5 ttl pk-yrs)    Types: Cigarettes    Start date: 04/06/1985    Quit date: 09/25/2017    Years since quitting: 5.8   Smokeless tobacco: Never  Vaping Use   Vaping status: Never Used  Substance and Sexual Activity   Alcohol use: Yes    Alcohol/week: 1.0 - 2.0  standard drink of alcohol    Types: 1 - 2 Glasses of wine per week    Comment: Socially   Drug use: No   Sexual activity: Not on file  Other Topics Concern   Not on file  Social History Narrative   Not on file   Social Determinants of Health   Financial Resource Strain: Not on file  Food Insecurity: Not on file  Transportation Needs: Not on file  Physical Activity: Not on file  Stress: Not on file  Social Connections: Not on file  Intimate Partner Violence: Not on file    Past Surgical History:  Procedure Laterality Date   APPENDECTOMY  1979   APPLICATION OF WOUND VAC Right 01/07/2021   Procedure: APPLICATION OF WOUND VAC;  Surgeon: Edwin Cap, DPM;  Location: [facility];  Service: Podiatry;  Laterality: Right;   BILATERAL CARPAL TUNNEL RELEASE Bilateral    Dr. Rosita Kea   CESAREAN SECTION  2003   GRAFT APPLICATION Right 01/07/2021   Procedure: GRAFT APPLICATION;  Surgeon: Edwin Cap, DPM;  Location: [facility];  Service: Podiatry;   Laterality: Right;   INCISION AND DRAINAGE Right 12/01/2020   Procedure: INCISION AND DRAINAGE;  Surgeon: Edwin Cap, DPM;  Location: [facility];  Service: Podiatry;  Laterality: Right;   INCISION AND DRAINAGE Right 12/07/2020   Procedure: INCISION AND DRAINAGE;  Surgeon: [name], DPM;  Location: [facility];  Service: Podiatry;  Laterality: Right;   INCISION AND DRAINAGE OF WOUND Right 01/07/2021   Procedure: IRRIGATION AND DEBRIDEMENT WOUND;  Surgeon: Edwin Cap, DPM;  Location: [facility];  Service: Podiatry;  Laterality: Right;  regional block if possible   LAPAROSCOPIC LYSIS OF ADHESIONS     SHOULDER SURGERY Right 08/2009   Dr. Rosita Kea   TUBAL LIGATION      Family History  Problem Relation Age of Onset   Hypertension Mother    Diabetes Mother    Hyperlipidemia Mother    Arthritis Mother    Heart disease Father    Kidney disease Father     Allergies  Allergen Reactions   Atorvastatin     Muscle and join pains.   Levaquin  [Levofloxacin In D5w]     GI upset   Simvastatin     joint aches.   Dilaudid  [Hydromorphone Hcl] Hives, Itching and Rash   Tetracycline Rash       Latest Ref Rng & Units 02/19/2023    1:02 PM 10/22/2022   10:45 PM 08/25/2022   11:09 AM  CBC  WBC 4.0 - 10.5 K/uL 9.8  17.0  13.2   Hemoglobin 12.0 - 15.0 g/dL 16.1  09.6  9.7   Hematocrit 36.0 - 46.0 % 35.1  34.5  32.1   Platelets 150 - 400 K/uL 251  269  409       CMP     Component Value Date/Time   NA 136 02/19/2023 1302   NA 137 08/29/2016 0915   K 4.0 02/19/2023 1302   CL 108 02/19/2023 1302   CO2 21 (L) 02/19/2023 1302   GLUCOSE 246 (H) 02/19/2023 1302   BUN 20 02/19/2023 1302   BUN 15 08/29/2016 0915   CREATININE 1.32 (H) 02/19/2023 1302   CALCIUM 7.8 (L) 02/19/2023 1302   PROT 7.0 10/22/2022 2245   PROT 6.9 08/29/2016 0915   ALBUMIN 3.4 (L) 10/22/2022 2245   ALBUMIN 4.1 08/29/2016 0915   AST 21 10/22/2022 2245   ALT 17 10/22/2022 2245  ALKPHOS 92 10/22/2022 2245    BILITOT 0.4 10/22/2022 2245   BILITOT <0.2 08/29/2016 0915   GFRNONAA 47 (L) 02/19/2023 1302     No results found.     Assessment & Plan:   1. Lymphedema Discussion with the patient regarding swelling as well as appropriate conservative therapies.  She has tried compression does wear it but is not consistent enough.  Also she milligrams in the evening.  She has been advised to walk for Singh in the morning and extend through the evening.  She should elevate her lower extremities when possible and not sleep in the stockings.  We discussed conservative management including the use of compression, elevation and activity as well as more advanced therapy such as lymphedema clinic and lymphedema clinic.  However the patient at this time wishes to go more conservatively so we will have her return in 3 months for exam and evaluation  2. Stage 3a chronic kidney disease (HCC) This may also contribute to the patient's lower extremity edema   Current Outpatient Medications on File Prior to Visit  Medication Sig Dispense Refill   albuterol (VENTOLIN HFA) 108 (90 Base) MCG/ACT inhaler Inhale 2 puffs into the lungs every 6 (six) hours as needed for wheezing or shortness of breath. 8 g 2   amitriptyline (ELAVIL) 25 MG tablet Take 25 mg by mouth at bedtime as needed (neuropathy pain).     aspirin 81 MG tablet Take 81 mg by mouth daily.     ATROVENT HFA 17 MCG/ACT inhaler Inhale 2 puffs into the lungs 4 (four) times daily.     cetirizine (ZYRTEC) 10 MG tablet TAKE 1 TABLET(10 MG) BY MOUTH DAILY (Patient taking differently: Take 10 mg by mouth daily.) 90 tablet 1   Continuous Glucose Sensor (DEXCOM G6 SENSOR) MISC SMARTSIG:Topical Every 10 Days     Continuous Glucose Transmitter (DEXCOM G6 TRANSMITTER) MISC USE TO MONITOR BLOOD SUGAR. REPLACE EVERY 3 MONTHS     cyclobenzaprine (FLEXERIL) 5 MG tablet Take 5 mg by mouth at bedtime as needed for muscle spasms.     docusate sodium (COLACE) 100 MG capsule Take 1  tablet once or twice daily as needed for constipation while taking narcotic pain medicine 30 capsule 0   esomeprazole (NEXIUM) 40 MG capsule Take 40 mg by mouth daily.     ezetimibe (ZETIA) 10 MG tablet Take 1 tablet (10 mg total) by mouth daily. 90 tablet 3   famotidine (PEPCID) 40 MG tablet Take 40 mg by mouth daily.     ferrous sulfate 325 (65 FE) MG tablet Take 325 mg by mouth 2 (two) times daily with a meal.     FLUoxetine (PROZAC) 20 MG capsule Take 1 capsule (20 mg total) by mouth daily. (Patient taking differently: Take 40 mg by mouth daily.)     furosemide (LASIX) 20 MG tablet Take 1 tablet (20 mg total) by mouth daily. 90 tablet 3   gabapentin (NEURONTIN) 300 MG capsule Take 2 capsules (600 mg total) by mouth 3 (three) times daily. 90 capsule 0   HUMALOG KWIKPEN 100 UNIT/ML KwikPen Inject 5 Units into the skin 3 (three) times daily with meals. INJ UP TO 100 UNI [place] PER DAY AS PER MD INSTRUCTIONS 15 mL 12   hydrOXYzine (ATARAX/VISTARIL) 10 MG tablet Take 10 mg by mouth 3 (three) times daily as needed for itching or anxiety.     Insulin Disposable Pump (OMNIPOD 5 DEXG7G6 PODS GEN 5) MISC SMARTSIG:1 Each SUB-Q Every 3 Days  Melatonin 10 MG TABS Take 10 mg by mouth at bedtime as needed (sleep).     meloxicam (MOBIC) 15 MG tablet Take 15 mg by mouth daily.     mupirocin ointment (BACTROBAN) 2 % Apply 1 application topically daily. 15 g 2   omeprazole (PRILOSEC) 40 MG capsule Take 40 mg by mouth 2 (two) times daily.     benzonatate (TESSALON PERLES) 100 MG capsule Take 1 capsule (100 mg total) by mouth 3 (three) times daily as needed for cough. (Patient not taking: Reported on 07/12/2023) 30 capsule 0   No current facility-administered medications on file prior to visit.    There are no Patient Instructions on file for this visit. No follow-ups on file.   Georgiana Spinner, NP

## 2023-08-01 ENCOUNTER — Ambulatory Visit: Payer: Medicaid Other | Attending: Cardiology

## 2023-08-09 ENCOUNTER — Ambulatory Visit: Payer: Medicaid Other | Attending: Cardiology

## 2023-08-09 DIAGNOSIS — R0600 Dyspnea, unspecified: Secondary | ICD-10-CM

## 2023-08-10 LAB — ECHOCARDIOGRAM COMPLETE: Area-P 1/2: 5.02 cm2

## 2023-08-29 ENCOUNTER — Ambulatory Visit: Payer: Medicaid Other | Attending: Cardiology | Admitting: Cardiology

## 2023-10-29 ENCOUNTER — Emergency Department
Admission: EM | Admit: 2023-10-29 | Discharge: 2023-10-29 | Discharge status: Against Medical Advice | Payer: Medicaid Other | Attending: Student in an Organized Health Care Education/Training Program | Admitting: Student in an Organized Health Care Education/Training Program

## 2023-10-29 ENCOUNTER — Ambulatory Visit (INDEPENDENT_AMBULATORY_CARE_PROVIDER_SITE_OTHER): Payer: Medicaid Other | Admitting: Nurse Practitioner

## 2023-10-29 ENCOUNTER — Other Ambulatory Visit: Payer: Self-pay

## 2023-10-29 DIAGNOSIS — M7989 Other specified soft tissue disorders: Secondary | ICD-10-CM | POA: Diagnosis present

## 2023-10-29 DIAGNOSIS — Z5321 Procedure and treatment not carried out due to patient leaving prior to being seen by health care provider: Secondary | ICD-10-CM | POA: Diagnosis not present

## 2023-10-29 LAB — COMPREHENSIVE METABOLIC PANEL
ALT: 24 U/L (ref 0–44)
AST: 25 U/L (ref 15–41)
Albumin: 3.5 g/dL (ref 3.5–5.0)
Alkaline Phosphatase: 78 U/L (ref 38–126)
Anion gap: 10 (ref 5–15)
BUN: 24 mg/dL — ABNORMAL HIGH (ref 6–20)
CO2: 21 mmol/L — ABNORMAL LOW (ref 22–32)
Calcium: 8.3 mg/dL — ABNORMAL LOW (ref 8.9–10.3)
Chloride: 99 mmol/L (ref 98–111)
Creatinine, Ser: 1.44 mg/dL — ABNORMAL HIGH (ref 0.44–1.00)
GFR, Estimated: 42 mL/min — ABNORMAL LOW (ref >60)
Glucose, Bld: 444 mg/dL — ABNORMAL HIGH (ref 70–99)
Potassium: 5.2 mmol/L — ABNORMAL HIGH (ref 3.5–5.1)
Sodium: 130 mmol/L — ABNORMAL LOW (ref 135–145)
Total Bilirubin: 0.3 mg/dL (ref 0.0–1.2)
Total Protein: 7.3 g/dL (ref 6.5–8.1)

## 2023-10-29 LAB — CBC WITH DIFFERENTIAL/PLATELET
Abs Immature Granulocytes: 0.06 10*3/uL (ref 0.00–0.07)
Basophils Absolute: 0.1 10*3/uL (ref 0.0–0.1)
Basophils Relative: 1 %
Eosinophils Absolute: 0.6 10*3/uL — ABNORMAL HIGH (ref 0.0–0.5)
Eosinophils Relative: 4 %
HCT: 36.1 % (ref 36.0–46.0)
Hemoglobin: 11.2 g/dL — ABNORMAL LOW (ref 12.0–15.0)
Immature Granulocytes: 0 %
Lymphocytes Relative: 13 %
Lymphs Abs: 1.7 10*3/uL (ref 0.7–4.0)
MCH: 28.2 pg (ref 26.0–34.0)
MCHC: 31 g/dL (ref 30.0–36.0)
MCV: 90.9 fL (ref 80.0–100.0)
Monocytes Absolute: 0.5 10*3/uL (ref 0.1–1.0)
Monocytes Relative: 4 %
Neutro Abs: 10.6 10*3/uL — ABNORMAL HIGH (ref 1.7–7.7)
Neutrophils Relative %: 78 %
Platelets: 280 10*3/uL (ref 150–400)
RBC: 3.97 MIL/uL (ref 3.87–5.11)
RDW: 14 % (ref 11.5–15.5)
WBC: 13.5 10*3/uL — ABNORMAL HIGH (ref 4.0–10.5)
nRBC: 0 % (ref 0.0–0.2)

## 2023-10-29 LAB — LACTIC ACID, PLASMA: Lactic Acid, Venous: 1.5 mmol/L (ref 0.5–1.9)

## 2023-10-29 NOTE — ED Triage Notes (Signed)
Right foot swelling and discoloration mid foot to distally.  Pressure wound between 4th and 5th toes.  Foot discoloration slightly red. Shiny area to anterior great toe.  Swelling and pain to area x 1 week.

## 2023-10-31 ENCOUNTER — Inpatient Hospital Stay
Admission: EM | Admit: 2023-10-31 | Discharge: 2023-11-02 | DRG: 629 | Disposition: A | Discharge status: Home / Self Care | Payer: Medicaid Other | Attending: Internal Medicine | Admitting: Internal Medicine

## 2023-10-31 ENCOUNTER — Emergency Department: Payer: Medicaid Other

## 2023-10-31 ENCOUNTER — Other Ambulatory Visit: Payer: Self-pay

## 2023-10-31 DIAGNOSIS — M21371 Foot drop, right foot: Secondary | ICD-10-CM | POA: Diagnosis present

## 2023-10-31 DIAGNOSIS — Z833 Family history of diabetes mellitus: Secondary | ICD-10-CM

## 2023-10-31 DIAGNOSIS — K219 Gastro-esophageal reflux disease without esophagitis: Secondary | ICD-10-CM | POA: Diagnosis present

## 2023-10-31 DIAGNOSIS — E1165 Type 2 diabetes mellitus with hyperglycemia: Secondary | ICD-10-CM | POA: Diagnosis present

## 2023-10-31 DIAGNOSIS — E1151 Type 2 diabetes mellitus with diabetic peripheral angiopathy without gangrene: Secondary | ICD-10-CM | POA: Diagnosis present

## 2023-10-31 DIAGNOSIS — E11628 Type 2 diabetes mellitus with other skin complications: Secondary | ICD-10-CM | POA: Diagnosis not present

## 2023-10-31 DIAGNOSIS — E1169 Type 2 diabetes mellitus with other specified complication: Secondary | ICD-10-CM | POA: Diagnosis present

## 2023-10-31 DIAGNOSIS — Z8261 Family history of arthritis: Secondary | ICD-10-CM

## 2023-10-31 DIAGNOSIS — Z841 Family history of disorders of kidney and ureter: Secondary | ICD-10-CM

## 2023-10-31 DIAGNOSIS — Z8249 Family history of ischemic heart disease and other diseases of the circulatory system: Secondary | ICD-10-CM

## 2023-10-31 DIAGNOSIS — R918 Other nonspecific abnormal finding of lung field: Secondary | ICD-10-CM | POA: Diagnosis not present

## 2023-10-31 DIAGNOSIS — M869 Osteomyelitis, unspecified: Secondary | ICD-10-CM | POA: Diagnosis present

## 2023-10-31 DIAGNOSIS — E785 Hyperlipidemia, unspecified: Secondary | ICD-10-CM | POA: Diagnosis present

## 2023-10-31 DIAGNOSIS — L03031 Cellulitis of right toe: Secondary | ICD-10-CM | POA: Diagnosis present

## 2023-10-31 DIAGNOSIS — Z7982 Long term (current) use of aspirin: Secondary | ICD-10-CM | POA: Diagnosis not present

## 2023-10-31 DIAGNOSIS — J4489 Other specified chronic obstructive pulmonary disease: Secondary | ICD-10-CM | POA: Diagnosis present

## 2023-10-31 DIAGNOSIS — Z83438 Family history of other disorder of lipoprotein metabolism and other lipidemia: Secondary | ICD-10-CM

## 2023-10-31 DIAGNOSIS — I5033 Acute on chronic diastolic (congestive) heart failure: Secondary | ICD-10-CM | POA: Diagnosis not present

## 2023-10-31 DIAGNOSIS — I70235 Atherosclerosis of native arteries of right leg with ulceration of other part of foot: Secondary | ICD-10-CM | POA: Diagnosis not present

## 2023-10-31 DIAGNOSIS — L97519 Non-pressure chronic ulcer of other part of right foot with unspecified severity: Secondary | ICD-10-CM | POA: Diagnosis present

## 2023-10-31 DIAGNOSIS — E1122 Type 2 diabetes mellitus with diabetic chronic kidney disease: Secondary | ICD-10-CM | POA: Diagnosis present

## 2023-10-31 DIAGNOSIS — M86171 Other acute osteomyelitis, right ankle and foot: Secondary | ICD-10-CM | POA: Diagnosis not present

## 2023-10-31 DIAGNOSIS — Z87891 Personal history of nicotine dependence: Secondary | ICD-10-CM

## 2023-10-31 DIAGNOSIS — E11649 Type 2 diabetes mellitus with hypoglycemia without coma: Secondary | ICD-10-CM | POA: Diagnosis not present

## 2023-10-31 DIAGNOSIS — E11621 Type 2 diabetes mellitus with foot ulcer: Secondary | ICD-10-CM | POA: Diagnosis present

## 2023-10-31 DIAGNOSIS — Z794 Long term (current) use of insulin: Secondary | ICD-10-CM | POA: Diagnosis not present

## 2023-10-31 DIAGNOSIS — Z79899 Other long term (current) drug therapy: Secondary | ICD-10-CM | POA: Diagnosis not present

## 2023-10-31 DIAGNOSIS — E1142 Type 2 diabetes mellitus with diabetic polyneuropathy: Secondary | ICD-10-CM | POA: Diagnosis present

## 2023-10-31 DIAGNOSIS — I129 Hypertensive chronic kidney disease with stage 1 through stage 4 chronic kidney disease, or unspecified chronic kidney disease: Secondary | ICD-10-CM | POA: Diagnosis present

## 2023-10-31 DIAGNOSIS — Z888 Allergy status to other drugs, medicaments and biological substances status: Secondary | ICD-10-CM

## 2023-10-31 DIAGNOSIS — Z6841 Body Mass Index (BMI) 40.0 and over, adult: Secondary | ICD-10-CM | POA: Diagnosis not present

## 2023-10-31 DIAGNOSIS — I872 Venous insufficiency (chronic) (peripheral): Secondary | ICD-10-CM | POA: Diagnosis present

## 2023-10-31 DIAGNOSIS — I890 Lymphedema, not elsewhere classified: Secondary | ICD-10-CM | POA: Diagnosis present

## 2023-10-31 DIAGNOSIS — L03115 Cellulitis of right lower limb: Principal | ICD-10-CM | POA: Diagnosis present

## 2023-10-31 DIAGNOSIS — Z9641 Presence of insulin pump (external) (internal): Secondary | ICD-10-CM | POA: Diagnosis present

## 2023-10-31 DIAGNOSIS — I2510 Atherosclerotic heart disease of native coronary artery without angina pectoris: Secondary | ICD-10-CM | POA: Diagnosis present

## 2023-10-31 DIAGNOSIS — Z7985 Long-term (current) use of injectable non-insulin antidiabetic drugs: Secondary | ICD-10-CM

## 2023-10-31 DIAGNOSIS — N1832 Chronic kidney disease, stage 3b: Secondary | ICD-10-CM | POA: Diagnosis present

## 2023-10-31 DIAGNOSIS — Z791 Long term (current) use of non-steroidal anti-inflammatories (NSAID): Secondary | ICD-10-CM

## 2023-10-31 DIAGNOSIS — L089 Local infection of the skin and subcutaneous tissue, unspecified: Secondary | ICD-10-CM | POA: Diagnosis not present

## 2023-10-31 DIAGNOSIS — N179 Acute kidney failure, unspecified: Secondary | ICD-10-CM | POA: Diagnosis present

## 2023-10-31 DIAGNOSIS — R21 Rash and other nonspecific skin eruption: Secondary | ICD-10-CM | POA: Diagnosis present

## 2023-10-31 DIAGNOSIS — F418 Other specified anxiety disorders: Secondary | ICD-10-CM

## 2023-10-31 DIAGNOSIS — I739 Peripheral vascular disease, unspecified: Secondary | ICD-10-CM

## 2023-10-31 DIAGNOSIS — Z881 Allergy status to other antibiotic agents status: Secondary | ICD-10-CM

## 2023-10-31 LAB — CBC WITH DIFFERENTIAL/PLATELET
Abs Immature Granulocytes: 0.03 10*3/uL (ref 0.00–0.07)
Basophils Absolute: 0.1 10*3/uL (ref 0.0–0.1)
Basophils Relative: 1 %
Eosinophils Absolute: 0.7 10*3/uL — ABNORMAL HIGH (ref 0.0–0.5)
Eosinophils Relative: 8 %
HCT: 35.9 % — ABNORMAL LOW (ref 36.0–46.0)
Hemoglobin: 11 g/dL — ABNORMAL LOW (ref 12.0–15.0)
Immature Granulocytes: 0 %
Lymphocytes Relative: 24 %
Lymphs Abs: 2.3 10*3/uL (ref 0.7–4.0)
MCH: 28.5 pg (ref 26.0–34.0)
MCHC: 30.6 g/dL (ref 30.0–36.0)
MCV: 93 fL (ref 80.0–100.0)
Monocytes Absolute: 0.5 10*3/uL (ref 0.1–1.0)
Monocytes Relative: 5 %
Neutro Abs: 5.9 10*3/uL (ref 1.7–7.7)
Neutrophils Relative %: 62 %
Platelets: 283 10*3/uL (ref 150–400)
RBC: 3.86 MIL/uL — ABNORMAL LOW (ref 3.87–5.11)
RDW: 14.1 % (ref 11.5–15.5)
WBC: 9.5 10*3/uL (ref 4.0–10.5)
nRBC: 0 % (ref 0.0–0.2)

## 2023-10-31 LAB — MRSA NEXT GEN BY PCR, NASAL: MRSA by PCR Next Gen: DETECTED — AB

## 2023-10-31 LAB — COMPREHENSIVE METABOLIC PANEL
ALT: 22 U/L (ref 0–44)
AST: 22 U/L (ref 15–41)
Albumin: 3.6 g/dL (ref 3.5–5.0)
Alkaline Phosphatase: 79 U/L (ref 38–126)
Anion gap: 8 (ref 5–15)
BUN: 27 mg/dL — ABNORMAL HIGH (ref 6–20)
CO2: 22 mmol/L (ref 22–32)
Calcium: 8.1 mg/dL — ABNORMAL LOW (ref 8.9–10.3)
Chloride: 104 mmol/L (ref 98–111)
Creatinine, Ser: 1.47 mg/dL — ABNORMAL HIGH (ref 0.44–1.00)
GFR, Estimated: 41 mL/min — ABNORMAL LOW (ref >60)
Glucose, Bld: 181 mg/dL — ABNORMAL HIGH (ref 70–99)
Potassium: 4.5 mmol/L (ref 3.5–5.1)
Sodium: 134 mmol/L — ABNORMAL LOW (ref 135–145)
Total Bilirubin: 0.2 mg/dL (ref 0.0–1.2)
Total Protein: 7.5 g/dL (ref 6.5–8.1)

## 2023-10-31 LAB — HEMOGLOBIN A1C
Hgb A1c MFr Bld: 8.2 % — ABNORMAL HIGH (ref 4.8–5.6)
Mean Plasma Glucose: 188.64 mg/dL

## 2023-10-31 LAB — GLUCOSE, CAPILLARY: Glucose-Capillary: 100 mg/dL — ABNORMAL HIGH (ref 70–99)

## 2023-10-31 LAB — SEDIMENTATION RATE: Sed Rate: 58 mm/h — ABNORMAL HIGH (ref 0–30)

## 2023-10-31 LAB — URIC ACID: Uric Acid, Serum: 6.6 mg/dL (ref 2.5–7.1)

## 2023-10-31 LAB — C-REACTIVE PROTEIN: CRP: 4 mg/dL — ABNORMAL HIGH (ref <1.0)

## 2023-10-31 LAB — CBG MONITORING, ED
Glucose-Capillary: 176 mg/dL — ABNORMAL HIGH (ref 70–99)
Glucose-Capillary: 222 mg/dL — ABNORMAL HIGH (ref 70–99)

## 2023-10-31 LAB — HIV ANTIBODY (ROUTINE TESTING W REFLEX): HIV Screen 4th Generation wRfx: NONREACTIVE

## 2023-10-31 MED ORDER — AMITRIPTYLINE HCL 25 MG PO TABS: 25.0000 mg | ORAL_TABLET | Freq: Every evening | ORAL | Status: DC | PRN

## 2023-10-31 MED ORDER — FUROSEMIDE 20 MG PO TABS
20.0000 mg | ORAL_TABLET | Freq: Every day | ORAL | Status: DC
  Administered 2023-11-01 – 2023-11-02 (×2): 20 mg via ORAL
  Filled 2023-10-31 (×2): qty 1

## 2023-10-31 MED ORDER — ACETAMINOPHEN 325 MG PO TABS
650.0000 mg | ORAL_TABLET | Freq: Four times a day (QID) | ORAL | Status: DC | PRN
  Administered 2023-10-31: 650 mg via ORAL
  Filled 2023-10-31 (×2): qty 2

## 2023-10-31 MED ORDER — PIPERACILLIN-TAZOBACTAM 3.375 G IVPB
3.3750 g | Freq: Three times a day (TID) | INTRAVENOUS | Status: DC
  Administered 2023-10-31 – 2023-11-02 (×5): 3.375 g via INTRAVENOUS
  Filled 2023-10-31 (×5): qty 50

## 2023-10-31 MED ORDER — INSULIN ASPART 100 UNIT/ML IJ SOLN: 0.0000 [IU] | Freq: Every day | INTRAMUSCULAR | Status: DC

## 2023-10-31 MED ORDER — IPRATROPIUM BROMIDE HFA 17 MCG/ACT IN AERS: 2.0000 | INHALATION_SPRAY | Freq: Four times a day (QID) | RESPIRATORY_TRACT | Status: DC

## 2023-10-31 MED ORDER — VANCOMYCIN HCL IN DEXTROSE 1-5 GM/200ML-% IV SOLN
1000.0000 mg | INTRAVENOUS | Status: DC
Start: 2023-11-01 — End: 2023-11-02
  Administered 2023-11-01: 1000 mg via INTRAVENOUS
  Filled 2023-10-31 (×2): qty 200

## 2023-10-31 MED ORDER — ONDANSETRON HCL 4 MG PO TABS: 4.0000 mg | ORAL_TABLET | Freq: Four times a day (QID) | ORAL | Status: DC | PRN

## 2023-10-31 MED ORDER — SODIUM CHLORIDE 0.9 % IV SOLN
1.0000 g | Freq: Once | INTRAVENOUS | Status: AC
  Administered 2023-10-31: 1 g via INTRAVENOUS
  Filled 2023-10-31: qty 10

## 2023-10-31 MED ORDER — GABAPENTIN 300 MG PO CAPS
600.0000 mg | ORAL_CAPSULE | Freq: Three times a day (TID) | ORAL | Status: DC
  Administered 2023-11-01 – 2023-11-02 (×4): 600 mg via ORAL
  Filled 2023-10-31 (×5): qty 2

## 2023-10-31 MED ORDER — DOCUSATE SODIUM 100 MG PO CAPS
100.0000 mg | ORAL_CAPSULE | Freq: Every day | ORAL | Status: DC | PRN
Start: 2023-10-31 — End: 2023-11-02

## 2023-10-31 MED ORDER — LORATADINE 10 MG PO TABS
10.0000 mg | ORAL_TABLET | Freq: Every day | ORAL | Status: DC
  Administered 2023-11-01 – 2023-11-02 (×2): 10 mg via ORAL
  Filled 2023-10-31 (×2): qty 1

## 2023-10-31 MED ORDER — FLUOXETINE HCL 20 MG PO CAPS
40.0000 mg | ORAL_CAPSULE | Freq: Every day | ORAL | Status: DC
  Administered 2023-11-01 – 2023-11-02 (×2): 40 mg via ORAL
  Filled 2023-10-31 (×2): qty 2

## 2023-10-31 MED ORDER — EZETIMIBE 10 MG PO TABS
10.0000 mg | ORAL_TABLET | Freq: Every day | ORAL | Status: DC
  Administered 2023-11-01 – 2023-11-02 (×2): 10 mg via ORAL
  Filled 2023-10-31 (×2): qty 1

## 2023-10-31 MED ORDER — INSULIN PUMP
Freq: Three times a day (TID) | SUBCUTANEOUS | Status: DC
  Administered 2023-10-31: 1 via SUBCUTANEOUS
  Filled 2023-10-31: qty 1

## 2023-10-31 MED ORDER — HYDROXYZINE HCL 10 MG PO TABS
10.0000 mg | ORAL_TABLET | Freq: Three times a day (TID) | ORAL | Status: DC | PRN
Start: 2023-10-31 — End: 2023-11-02
  Administered 2023-11-01: 10 mg via ORAL
  Filled 2023-10-31 (×2): qty 1

## 2023-10-31 MED ORDER — ALBUTEROL SULFATE (2.5 MG/3ML) 0.083% IN NEBU: 3.0000 mL | INHALATION_SOLUTION | Freq: Four times a day (QID) | RESPIRATORY_TRACT | Status: DC | PRN

## 2023-10-31 MED ORDER — INSULIN ASPART 100 UNIT/ML IJ SOLN: 0.0000 [IU] | Freq: Three times a day (TID) | INTRAMUSCULAR | Status: DC

## 2023-10-31 MED ORDER — MELATONIN 5 MG PO TABS: 10.0000 mg | ORAL_TABLET | Freq: Every evening | ORAL | Status: DC | PRN

## 2023-10-31 MED ORDER — ONDANSETRON HCL 4 MG/2ML IJ SOLN: 4.0000 mg | Freq: Four times a day (QID) | INTRAMUSCULAR | Status: DC | PRN

## 2023-10-31 MED ORDER — VANCOMYCIN HCL 1750 MG/350ML IV SOLN
1750.0000 mg | INTRAVENOUS | Status: DC
Start: 2023-11-02 — End: 2023-10-31

## 2023-10-31 MED ORDER — VANCOMYCIN HCL 500 MG/100ML IV SOLN
500.0000 mg | Freq: Once | INTRAVENOUS | Status: AC
  Administered 2023-10-31: 500 mg via INTRAVENOUS
  Filled 2023-10-31 (×2): qty 100

## 2023-10-31 MED ORDER — VANCOMYCIN HCL 1750 MG/350ML IV SOLN
1750.0000 mg | Freq: Once | INTRAVENOUS | Status: AC
  Administered 2023-10-31: 1750 mg via INTRAVENOUS
  Filled 2023-10-31 (×2): qty 350

## 2023-10-31 MED ORDER — PANTOPRAZOLE SODIUM 40 MG PO TBEC
40.0000 mg | DELAYED_RELEASE_TABLET | Freq: Every day | ORAL | Status: DC
  Administered 2023-11-01 – 2023-11-02 (×2): 40 mg via ORAL
  Filled 2023-10-31 (×2): qty 1

## 2023-10-31 MED ORDER — ACETAMINOPHEN 650 MG RE SUPP: 650.0000 mg | Freq: Four times a day (QID) | RECTAL | Status: DC | PRN

## 2023-10-31 MED ORDER — CYCLOBENZAPRINE HCL 10 MG PO TABS: 5.0000 mg | ORAL_TABLET | Freq: Every evening | ORAL | Status: DC | PRN

## 2023-10-31 MED ORDER — FAMOTIDINE 20 MG PO TABS
40.0000 mg | ORAL_TABLET | Freq: Every day | ORAL | Status: DC
  Administered 2023-11-01 – 2023-11-02 (×2): 40 mg via ORAL
  Filled 2023-10-31 (×3): qty 2

## 2023-10-31 MED ORDER — OXYCODONE HCL 5 MG PO TABS
5.0000 mg | ORAL_TABLET | ORAL | Status: DC | PRN
Start: 2023-10-31 — End: 2023-11-02
  Administered 2023-10-31 – 2023-11-02 (×4): 5 mg via ORAL
  Filled 2023-10-31 (×4): qty 1

## 2023-10-31 MED ORDER — ASPIRIN 81 MG PO TBEC
81.0000 mg | DELAYED_RELEASE_TABLET | Freq: Every day | ORAL | Status: DC
  Administered 2023-11-01 – 2023-11-02 (×2): 81 mg via ORAL
  Filled 2023-10-31 (×2): qty 1

## 2023-10-31 MED ORDER — ENOXAPARIN SODIUM 60 MG/0.6ML IJ SOSY
50.0000 mg | PREFILLED_SYRINGE | INTRAMUSCULAR | Status: DC
  Administered 2023-10-31 – 2023-11-01 (×2): 50 mg via SUBCUTANEOUS
  Filled 2023-10-31 (×3): qty 0.6

## 2023-10-31 NOTE — ED Triage Notes (Signed)
 Pt states R foot pain /great toe pain that started 3 weeks. Pt denies fevers. Pt states HX of diabetes.

## 2023-10-31 NOTE — Consult Note (Addendum)
 Pharmacy Antibiotic Note  ASSESSMENT: 58 y.o. female with PMH including CKD3, obesity, DM, HLD, venous insufficiency is presenting with cellulitis and concerns for osteomyelitis. She endorses that her right toe has been swollen with erythema extending up her foot for the past 2 weeks, s/p course of cephalexin  that did not help. She is afebrile and VSS, but did present with leukocytosis which is improving. Pharmacy has been consulted to manage vancomycin  dosing. She is also currently receiving ceftriaxone . Patient's current renal function is at baseline.  Update: While placing orders, Foot XR made available in chart and imaging assessment notes concern for osteomyelitis involving first distal phalanx. Will update original vancomycin  regimen in light of this new indication.  2nd Update: Pharmacy now consulted for Zosyn . This will replace ceftriaxone .  Patient measurements: Height: 5' 2 (157.5 cm) Weight: 108 kg (238 lb 1.6 oz) IBW/kg (Calculated) : 50.1  Vital signs: Temp: 98.2 F (36.8 C) (02/05 0846) Temp Source: Oral (02/05 0846) BP: 117/55 (02/05 0846) Pulse Rate: 78 (02/05 0846) Recent Labs  Lab 10/29/23 1550 10/31/23 0847  WBC 13.5* 9.5  CREATININE 1.44* 1.47*   Estimated Creatinine Clearance: 48.3 mL/min (A) (by C-G formula based on SCr of 1.47 mg/dL (H)).  Allergies: Allergies  Allergen Reactions   Atorvastatin      Muscle and join pains.   Levaquin   [Levofloxacin  In D5w]     GI upset   Simvastatin     joint aches.   Dilaudid  [Hydromorphone Hcl] Hives, Itching and Rash   Tetracycline Rash    Antimicrobials this admission: Ceftriaxone  2/5 >> 2/5 Zosyn  2/5 >> Vancomycin  2/5 >>  Dose adjustments this admission: N/A  Microbiology results: N/A  PLAN: Administer vancomycin  2250 mg IV x 1 as a loading dose, followed by 1000 mg IV q24H thereafter eAUC 582, Cmax 34.7, Cmin 16.7 Scr 1.47, IBW, Vd 0.5 L/kg Discontinue ceftriaxone  and initiate Zosyn  3.375 g IV  q8H Follow up culture results to assess for antibiotic optimization. Monitor renal function to assess for any necessary antibiotic dosing changes.   Thank you for allowing pharmacy to be a part of this patient's care.  Will M. Lenon, PharmD Clinical Pharmacist 10/31/2023 12:11 PM

## 2023-10-31 NOTE — ED Provider Notes (Addendum)
 [facility] Provider Note    Event Date/Time   First MD Initiated Contact with Patient 10/31/23 1114     (approximate)   History   Foot Pain   HPI  Emily Collins is a 58 y.o. female with history of diabetes, GERD, hyperlipidemia, venous insufficiency, presenting with right toe swelling and erythema that is extending up her foot.  States this started about 2 weeks ago, she is completed 1 week course of antibiotics with no improvement, states that redness is extending past the middle of her foot.  No fevers.  States no new weakness or numbness.  States that she did not stub her toe.    On independent chart review she has history of lymphedema and CKD, was seen by vascular surgery in November of last year, they are doing conservative management for her lymphedema.  Will last seen by podiatry in 2022 where she had a right ankle I&D.  States that she is unable to fully dorsi and plantarflex her toes due to her prior surgery from that infection and operation.     Physical Exam   Triage Vital Signs: ED Triage Vitals  Encounter Vitals Group     BP 10/31/23 0846 (!) 117/55     Systolic BP Percentile --      Diastolic BP Percentile --      Pulse Rate 10/31/23 0846 78     Resp 10/31/23 0846 19     Temp 10/31/23 0846 98.2 F (36.8 C)     Temp Source 10/31/23 0846 Oral     SpO2 10/31/23 0846 97 %     Weight 10/31/23 0838 238 lb 1.6 oz (108 kg)     Height 10/31/23 0838 5' 2 (1.575 m)     Head Circumference --      Peak Flow --      Pain Score 10/31/23 0837 8     Pain Loc --      Pain Education --      Exclude from Growth Chart --     Most recent vital signs: Vitals:   10/31/23 0846  BP: (!) 117/55  Pulse: 78  Resp: 19  Temp: 98.2 F (36.8 C)  SpO2: 97%     General: Awake, no distress.  CV:  Good peripheral perfusion.  Resp:  Normal effort.  Abd:  No distention.  Other:  Right large toe with swelling and erythema extending up to her  proximal foot.  She has limited dorsi and plantarflexion that is chronic she is able to at actively range her large toe without any new limitations.  She has a superficial ulcer to the top of her right toe that does not appear to be necrotic.  She has decreased sensation to her foot which she says is normal   ED Results / Procedures / Treatments   Labs (all labs ordered are listed, but only abnormal results are displayed) Labs Reviewed  CBC WITH DIFFERENTIAL/PLATELET - Abnormal; Notable for the following components:      Result Value   RBC 3.86 (*)    Hemoglobin 11.0 (*)    HCT 35.9 (*)    Eosinophils Absolute 0.7 (*)    All other components within normal limits  COMPREHENSIVE METABOLIC PANEL - Abnormal; Notable for the following components:   Sodium 134 (*)    Glucose, Bld 181 (*)    BUN 27 (*)    Creatinine, Ser 1.47 (*)    Calcium  8.1 (*)  GFR, Estimated 41 (*)    All other components within normal limits     RADIOLOGY X-ray on my interpretation without obvious fracture   PROCEDURES:  Critical Care performed: No  Procedures   MEDICATIONS ORDERED IN ED: Medications  cefTRIAXone  (ROCEPHIN ) 1 g in sodium chloride  0.9 % 100 mL IVPB (has no administration in time range)     IMPRESSION / MDM / ASSESSMENT AND PLAN / ED COURSE  I reviewed the triage vital signs and the nursing notes.                              Differential diagnosis includes, but is not limited to, cellulitis, diabetic ulcer, no evidence of deeper infection, considered but doubt osteomyelitis at this time.  Also considered septic joint but she is able to range her toe without pain.  Labs and x-ray was obtained at triage.  Will add on IV vanc as well as ceftriaxone .  Given that she has persistent cellulitis despite 1 week of p.o. antibiotics, I believe that she is failed outpatient therapy and will require IV antibiotics as well as inpatient podiatry evaluation.  Shared decision making done with patient  and she is agreeable with the plan for admission.  patient's presentation is most consistent with acute presentation with potential threat to life or bodily function.  Independent review and interpretation of labs, creatinine is mildly elevated but this is consistent compared to prior, electrolytes are not severely deranged, no leukocytosis, no obvious fracture on x-ray.  Consulted hospitalist who is agreeable with plan for admission and will evaluate the patient.  She is admitted.        FINAL CLINICAL IMPRESSION(S) / ED DIAGNOSES   Final diagnoses:  Cellulitis of right lower extremity     Rx / DC Orders   ED Discharge Orders     None        Note:  This document was prepared using Dragon voice recognition software and may include unintentional dictation errors.    Waymond Lorelle Cummins, MD 10/31/23 1159    Waymond Lorelle Cummins, MD 10/31/23 1200

## 2023-10-31 NOTE — ED Notes (Signed)
 Pt toe dressed in non-stick dressing and kerlex

## 2023-10-31 NOTE — Consult Note (Signed)
 PODIATRY / FOOT AND ANKLE SURGERY CONSULTATION NOTE  Requesting Physician: Dr. Laurita  Reason for consult: R toe infection   HPI: Emily Collins is a 58 y.o. female who presents with a nonhealing ulceration to the right big toe which started about 3 weeks ago.  She had increased redness and swelling to the area and went to an urgent care and was told it could be infection or possibly gout.  Patient did have blood work apparently that showed slight elevation in uric acid and patient was given 1 week course of Keflex  however the big toe continued to swell after that time and she developed some cracks/skin that peeled on the top of the toe.  She went to the emergency room today for further evaluation had x-rays taken which revealed early signs of osteomyelitis to the right hallux.  Patient has subsequently been admitted to the hospital.  She previously also underwent multiple I&D's for right ankle abscess/infection and had a part of her tibialis anterior removed so as a result has some degree of dropfoot.  She also has moderate to severe lymphedema and does see vascular surgery outpatient for this.  PMHx:  Past Medical History:  Diagnosis Date   Allergy    Anxiety    Arthritis    Asthma    Depression    Diabetes mellitus without complication (HCC)    GERD (gastroesophageal reflux disease)    History of chicken pox 04/07/2015   DID have Chicken Pox.     Hyperlipidemia    Hypertension    Neuromuscular disorder (HCC)    Venous (peripheral) insufficiency     Surgical Hx:  Past Surgical History:  Procedure Laterality Date   APPENDECTOMY  1979   APPLICATION OF WOUND VAC Right 01/07/2021   Procedure: APPLICATION OF WOUND VAC;  Surgeon: Silva Juliene SAUNDERS, DPM;  Location: [facility];  Service: Podiatry;  Laterality: Right;   BILATERAL CARPAL TUNNEL RELEASE Bilateral    Dr. Kathlynn   CESAREAN SECTION  2003   GRAFT APPLICATION Right 01/07/2021   Procedure: GRAFT APPLICATION;  Surgeon: Silva Juliene SAUNDERS, DPM;  Location: [facility];  Service: Podiatry;  Laterality: Right;   INCISION AND DRAINAGE Right 12/01/2020   Procedure: INCISION AND DRAINAGE;  Surgeon: Silva Juliene SAUNDERS, DPM;  Location: [facility];  Service: Podiatry;  Laterality: Right;   INCISION AND DRAINAGE Right 12/07/2020   Procedure: INCISION AND DRAINAGE;  Surgeon: Janit Thresa HERO, DPM;  Location: [facility];  Service: Podiatry;  Laterality: Right;   INCISION AND DRAINAGE OF WOUND Right 01/07/2021   Procedure: IRRIGATION AND DEBRIDEMENT WOUND;  Surgeon: Silva Juliene SAUNDERS, DPM;  Location: [facility];  Service: Podiatry;  Laterality: Right;  regional block if possible   LAPAROSCOPIC LYSIS OF ADHESIONS     SHOULDER SURGERY Right 08/2009   Dr. Kathlynn   TUBAL LIGATION      FHx:  Family History  Problem Relation Age of Onset   Hypertension Mother    Diabetes Mother    Hyperlipidemia Mother    Arthritis Mother    Heart disease Father    Kidney disease Father     Social History:  reports that she quit smoking about 6 years ago. Her smoking use included cigarettes. She started smoking about 38 years ago. She has a 32.5 pack-year smoking history. She has never used smokeless tobacco. She reports current alcohol use of about 1.0 - 2.0 standard drink of alcohol per week. She reports that she does not use drugs.  Allergies:  Allergies  Allergen Reactions   Atorvastatin      Muscle and join pains.   Levaquin   [Levofloxacin  In D5w]     GI upset   Simvastatin     joint aches.   Dilaudid  [Hydromorphone Hcl] Hives, Itching and Rash   Tetracycline Rash    (Not in a hospital admission)   Physical Exam: General: Alert and oriented.  No apparent distress.  Vascular: Difficult to palpate DP/PT pulses bilaterally due to moderate swelling.  Lymphedema present to bilateral lower extremities.  Capillary fill time intact to digits.  No hair growth present to bilateral lower extremities.  Neuro: Light touch sensation reduced to bilateral lower  extremities.  Derm: Scars present to the anterior medial and anterior lateral aspects of the ankle, well-healed, no openings.  Right hallux dorsal laterally at the IPJ appears to have a small superficial opening with associated erythema and edema surrounding this area that extends to the metatarsal phalangeal joint, the wound is fairly superficial and measures approximately 1 cm x 0.2 cm x 0.1 cm, does not appear to probe deep at this time.    Right fourth toe with lateral small ulceration at the PIPJ likely related from the fourth and fifth toes rubbing together, appears to be macerated, appears fairly superficial and measures approximately 0.1 x 0.1 x 0.1 cm.  MSK: Dropfoot right side.  Results for orders placed or performed during the hospital encounter of 10/31/23 (from the past 48 hours)  CBC with Differential     Status: Abnormal   Collection Time: 10/31/23  8:47 AM  Result Value Ref Range   WBC 9.5 4.0 - 10.5 K/uL   RBC 3.86 (L) 3.87 - 5.11 MIL/uL   Hemoglobin 11.0 (L) 12.0 - 15.0 g/dL   HCT 64.0 (L) 63.9 - 53.9 %   MCV 93.0 80.0 - 100.0 fL   MCH 28.5 26.0 - 34.0 pg   MCHC 30.6 30.0 - 36.0 g/dL   RDW 85.8 88.4 - 84.4 %   Platelets 283 150 - 400 K/uL   nRBC 0.0 0.0 - 0.2 %   Neutrophils Relative % 62 %   Neutro Abs 5.9 1.7 - 7.7 K/uL   Lymphocytes Relative 24 %   Lymphs Abs 2.3 0.7 - 4.0 K/uL   Monocytes Relative 5 %   Monocytes Absolute 0.5 0.1 - 1.0 K/uL   Eosinophils Relative 8 %   Eosinophils Absolute 0.7 (H) 0.0 - 0.5 K/uL   Basophils Relative 1 %   Basophils Absolute 0.1 0.0 - 0.1 K/uL   Immature Granulocytes 0 %   Abs Immature Granulocytes 0.03 0.00 - 0.07 K/uL    Comment: Performed at [facility], [address] [place], [place] [address]  Comprehensive metabolic panel     Status: Abnormal   Collection Time: 10/31/23  8:47 AM  Result Value Ref Range   Sodium 134 (L) 135 - 145 mmol/L   Potassium 4.5 3.5 - 5.1 mmol/L   Chloride 104 98 - 111 mmol/L    CO2 22 22 - 32 mmol/L   Glucose, Bld 181 (H) 70 - 99 mg/dL    Comment: Glucose reference range applies only to samples taken after fasting for at least 8 hours.   BUN 27 (H) 6 - 20 mg/dL   Creatinine, Ser 8.52 (H) 0.44 - 1.00 mg/dL   Calcium  8.1 (L) 8.9 - 10.3 mg/dL   Total Protein 7.5 6.5 - 8.1 g/dL   Albumin 3.6 3.5 - 5.0 g/dL  AST 22 15 - 41 U/L   ALT 22 0 - 44 U/L   Alkaline Phosphatase 79 38 - 126 U/L   Total Bilirubin 0.2 0.0 - 1.2 mg/dL   GFR, Estimated 41 (L) >60 mL/min    Comment: (NOTE) Calculated using the CKD-EPI Creatinine Equation (2021)    Anion gap 8 5 - 15    Comment: Performed at [facility], [address] [place], [place] [address]  Uric acid     Status: None   Collection Time: 10/31/23  8:47 AM  Result Value Ref Range   Uric Acid, Serum 6.6 2.5 - 7.1 mg/dL    Comment: Performed at [facility], [address] [place], [place] [address]  CBG monitoring, ED     Status: Abnormal   Collection Time: 10/31/23  2:41 PM  Result Value Ref Range   Glucose-Capillary 176 (H) 70 - 99 mg/dL    Comment: Glucose reference range applies only to samples taken after fasting for at least 8 hours.  MRSA Next Gen by PCR, Nasal     Status: Abnormal   Collection Time: 10/31/23  2:42 PM   Specimen: Nasal Mucosa; Nasal Swab  Result Value Ref Range   MRSA by PCR Next Gen DETECTED (A) NOT DETECTED    Comment: RESULT CALLED TO, READ BACK BY AND VERIFIED WITH: JESS SAVAGE 10/31/23 1826 KLW (NOTE) The GeneXpert MRSA Assay (FDA approved for NASAL specimens only), is one component of a comprehensive MRSA colonization surveillance program. It is not intended to diagnose MRSA infection nor to guide or monitor treatment for MRSA infections. Test performance is not FDA approved in patients less than 49 years old. Performed at [facility], [address] [place], [place] [address]   Sedimentation rate     Status: Abnormal   Collection Time: 10/31/23   2:42 PM  Result Value Ref Range   Sed Rate 58 (H) 0 - 30 mm/hr    Comment: Performed at [facility], [address] [place], [place] [address]  CBG monitoring, ED     Status: Abnormal   Collection Time: 10/31/23  6:33 PM  Result Value Ref Range   Glucose-Capillary 222 (H) 70 - 99 mg/dL    Comment: Glucose reference range applies only to samples taken after fasting for at least 8 hours.   DG Foot Complete Right Result Date: 10/31/2023 CLINICAL DATA:  Right foot pain for 3 weeks without reported injury. EXAM: RIGHT FOOT COMPLETE - 3+ VIEW COMPARISON:  November 29, 2020. FINDINGS: There is lucency seen involving the first distal phalanx concerning for possible osteomyelitis. Joint spaces are intact. Mild posterior calcaneal spurring is noted. Vascular calcifications are noted. IMPRESSION: Findings concerning for osteomyelitis involving first distal phalanx. Electronically Signed   By: Lynwood Landy Raddle M.D.   On: 10/31/2023 12:23    Blood pressure 127/65, pulse 69, temperature 98.1 F (36.7 C), temperature source Oral, resp. rate (!) 22, height 5' 2 (1.575 m), weight 108 kg, last menstrual period 07/15/2018, SpO2 95%.  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 -Imaging reviewed and discussed with patient in detail.  Appears show early onset osteomyelitis to the distal phalanx laterally near the IPJ hallux -Discussed wounds present to the right foot as well as lymphedema and dropfoot.  Also discussed concern for peripheral vascular disease based on  arterial calcification seen on x-ray imaging and unable to feel pulses which could be related more so to lymphedema. -Perform debridement to the right hallux wound as well as right lateral fourth toe wound.  Patient tolerated well.  Do not see any  obvious bone exposed at this time but patient does appear to have active infection present to the right hallux due to surrounding erythema and edema. -Discussed concern for osteomyelitis to the right great toe.  Discussed treatment options for this issue.  Discussed conservative management with antibiotic therapy versus surgical management which would consist of partial amputation of the toe.  Believe that the toe amputation would be definitive to remove infection.  Patient elected for conservative care today.  Discussed with patient that we can try this but if it worsens or remains unimproved would recommend partial amputation of the toe in the future. -Wound culture taken and sent off today. -Appreciate medicine recommendations for antibiotic therapy.  Consult placed for infectious disease.  May require long-term IV antibiotics.  Discussed with Dr. Searcy.  Could consider bone biopsy in the future if ID would like in the next few days. -Vascular surgery also consulted for ABIs and assessment of circulation as well as lymphedema.  Discussed with Dr. Marea.  Podiatry team will follow more peripherally at this time.  If surgery is indicated or patient elects for this we will follow back up.  Otherwise can follow in outpatient clinic.  100 percent excisional subcutaneous debridment of ulcer: Location: Right hallux dorsal lateral IPJ Pre-debridement measurement: 1 x 0.2 x 0.1 cm Post-debridement measurement: Same Tissue removed:   Hyperkeratotic tissue, biofilm Ulcer was debrided sharply with combination of tissue nippers and scalpel blade into the subcutaneous tissue  100% excisional subcutaneous debridment of ulcer: Location: Right fourth toe lateral PIPJ Pre-debridement measurement: 0.1 x 0.1 x 0.1 cm Post-debridement measurement: Same Tissue removed:   Biofilm, macerated tissue Ulcer was debrided sharply with combination of tissue nippers and scalpel blade into the subcutaneous  tissue   Prentice Lee, DPM 10/31/2023, 6:38 PM

## 2023-10-31 NOTE — H&P (Signed)
 History and Physical    Emily Collins:[id number] DOB: 06-11-1966 DOA: 10/31/2023  PCP: Emily Stefano Iles, MD (Confirm with patient/family/NH records and if not entered, this has to be entered at [facility] point of entry) Patient coming from: Home  I have personally briefly reviewed patient's old medical records in [facility]  Chief Complaint: Right big toe pain, swelling  HPI: Emily Collins is a 58 y.o. female with medical history significant of IDDM, HTN, HLD, CKD stage IIIb, presented with worsening of right big toe swelling rash and pain.  Symptoms started 3 weeks ago, patient started develop rash and swelling of right big toe.  She went to see urgent care center and was told  could be infection, could be gout the patient never had history of gout before.  And blood work did show she has slightly elevated uric acid.  Patient was given 1 week course of Keflex  for the visit.  However the symptoms of right big toe swelling rash and pain continues after 7 days of Keflex  and last week patient also developed cracks in the skin on top of the right big toe with some bleeding but no significant past from the bone.  Denied any fever chills and she does not remember any direct injuries to the right big toe. ED Course: Afebrile, blood pressure 117/55.  Blood work showed WBC 9.5 with normal differential, creatinine 1.4 compared to baseline 1.3-1.4, bicarb 23.  X-ray showed concerning signs for osteomyelitis on right big toe, distal phalanx.  Vancomycin  and ceftriaxone  given in the ED.  Review of Systems: As per HPI otherwise 14 point review of systems negative.    Past Medical History:  Diagnosis Date   Allergy    Anxiety    Arthritis    Asthma    Depression    Diabetes mellitus without complication (HCC)    GERD (gastroesophageal reflux disease)    History of chicken pox 04/07/2015   DID have Chicken Pox.     Hyperlipidemia    Hypertension    Neuromuscular disorder (HCC)     Venous (peripheral) insufficiency     Past Surgical History:  Procedure Laterality Date   APPENDECTOMY  1979   APPLICATION OF WOUND VAC Right 01/07/2021   Procedure: APPLICATION OF WOUND VAC;  Surgeon: Silva Juliene SAUNDERS, DPM;  Location: [facility] ORS;  Service: Podiatry;  Laterality: Right;   BILATERAL CARPAL TUNNEL RELEASE Bilateral    Dr. Kathlynn   CESAREAN SECTION  2003   GRAFT APPLICATION Right 01/07/2021   Procedure: GRAFT APPLICATION;  Surgeon: Silva Juliene SAUNDERS, DPM;  Location: [facility] ORS;  Service: Podiatry;  Laterality: Right;   INCISION AND DRAINAGE Right 12/01/2020   Procedure: INCISION AND DRAINAGE;  Surgeon: Silva Juliene SAUNDERS, DPM;  Location: [facility] ORS;  Service: Podiatry;  Laterality: Right;   INCISION AND DRAINAGE Right 12/07/2020   Procedure: INCISION AND DRAINAGE;  Surgeon: Janit Thresa HERO, DPM;  Location: [facility] ORS;  Service: Podiatry;  Laterality: Right;   INCISION AND DRAINAGE OF WOUND Right 01/07/2021   Procedure: IRRIGATION AND DEBRIDEMENT WOUND;  Surgeon: Silva Juliene SAUNDERS, DPM;  Location: [facility] ORS;  Service: Podiatry;  Laterality: Right;  regional block if possible   LAPAROSCOPIC LYSIS OF ADHESIONS     SHOULDER SURGERY Right 08/2009   Dr. Kathlynn   TUBAL LIGATION       reports that she quit smoking about 6 years ago. Her smoking use included cigarettes. She started smoking about 38 years ago. She has  a 32.5 pack-year smoking history. She has never used smokeless tobacco. She reports current alcohol use of about 1.0 - 2.0 standard drink of alcohol per week. She reports that she does not use drugs.  Allergies  Allergen Reactions   Atorvastatin      Muscle and join pains.   Levaquin   [Levofloxacin  In D5w]     GI upset   Simvastatin     joint aches.   Dilaudid  [Hydromorphone Hcl] Hives, Itching and Rash   Tetracycline Rash    Family History  Problem Relation Age of Onset   Hypertension Mother    Diabetes Mother    Hyperlipidemia Mother    Arthritis Mother    Heart disease  Father    Kidney disease Father      Prior to Admission medications   Medication Sig Start Date End Date Taking? Authorizing Provider  albuterol  (VENTOLIN  HFA) 108 (90 Base) MCG/ACT inhaler Inhale 2 puffs into the lungs every 6 (six) hours as needed for wheezing or shortness of breath. 08/25/22   Ernest Ronal BRAVO, MD  amitriptyline  (ELAVIL ) 25 MG tablet Take 25 mg by mouth at bedtime as needed (neuropathy pain).    [provider]  aspirin  81 MG tablet Take 81 mg by mouth daily.    [provider]  ATROVENT  HFA 17 MCG/ACT inhaler Inhale 2 puffs into the lungs 4 (four) times daily.    [provider]  cetirizine  (ZYRTEC ) 10 MG tablet TAKE 1 TABLET(10 MG) BY MOUTH DAILY Patient taking differently: Take 10 mg by mouth daily. 05/16/17   Gasper Nancyann BRAVO, MD  Continuous Glucose Sensor (DEXCOM G6 SENSOR) MISC SMARTSIG:Topical Every 10 Days    [provider]  Continuous Glucose Transmitter (DEXCOM G6 TRANSMITTER) MISC USE TO MONITOR BLOOD SUGAR. REPLACE EVERY 3 MONTHS 04/10/23   [provider]  cyclobenzaprine  (FLEXERIL ) 5 MG tablet Take 5 mg by mouth at bedtime as needed for muscle spasms.    [provider]  docusate sodium  (COLACE) 100 MG capsule Take 1 tablet once or twice daily as needed for constipation while taking narcotic pain medicine 10/23/22   Gordan Huxley, MD  esomeprazole  (NEXIUM ) 40 MG capsule Take 40 mg by mouth daily. 07/24/23   [provider]  ezetimibe  (ZETIA ) 10 MG tablet Take 1 tablet (10 mg total) by mouth daily. 07/12/23 10/10/23  Darliss Rogue, MD  famotidine  (PEPCID ) 40 MG tablet Take 40 mg by mouth daily. 10/19/20   [provider]  ferrous sulfate  325 (65 FE) MG tablet Take 325 mg by mouth 2 (two) times daily with a meal.    [provider]  FLUoxetine  (PROZAC ) 20 MG capsule Take 1 capsule (20 mg total) by mouth daily. Patient taking differently: Take 40 mg by mouth daily. 12/10/20   Josette Ade, MD  furosemide  (LASIX ) 20 MG tablet Take 1 tablet (20 mg total) by mouth daily. 07/12/23 10/10/23  Darliss Rogue, MD  gabapentin  (NEURONTIN ) 300 MG capsule Take 2 capsules (600 mg total) by mouth 3 (three) times daily. 01/08/21   Krishnan, Gokul, MD  HUMALOG  KWIKPEN 100 UNIT/ML KwikPen Inject 5 Units into the skin 3 (three) times daily with meals. INJ UP TO 100 UNI [place] PER DAY AS PER MD INSTRUCTIONS 12/09/20   Josette Ade, MD  hydrOXYzine  (ATARAX /VISTARIL ) 10 MG tablet Take 10 mg by mouth 3 (three) times daily as needed for itching or anxiety.    Emily Stefano Iles, MD  Insulin  Disposable Pump (OMNIPOD 5 DEXG7G6 PODS  GEN 5) MISC SMARTSIG:1 Each SUB-Q Every 3 Days    [provider]  Melatonin 10 MG TABS Take 10 mg by mouth at bedtime as needed (sleep).    [provider]  meloxicam  (MOBIC ) 15 MG tablet Take 15 mg by mouth daily.    [provider]  mupirocin  ointment (BACTROBAN ) 2 % Apply 1 application topically daily. 04/27/21   McDonald, Juliene SAUNDERS, DPM  omeprazole (PRILOSEC) 40 MG capsule Take 40 mg by mouth 2 (two) times daily. 11/21/20   [provider]    Physical Exam: Vitals:   10/31/23 0838 10/31/23 0846  BP:  (!) 117/55  Pulse:  78  Resp:  19  Temp:  98.2 F (36.8 C)  TempSrc:  Oral  SpO2:  97%  Weight: 108 kg   Height: 5' 2 (1.575 m)     Constitutional: NAD, calm, comfortable Vitals:   10/31/23 0838 10/31/23 0846  BP:  (!) 117/55  Pulse:  78  Resp:  19  Temp:  98.2 F (36.8 C)  TempSrc:  Oral  SpO2:  97%  Weight: 108 kg   Height: 5' 2 (1.575 m)    Eyes: PERRL, lids and conjunctivae normal ENMT: Mucous membranes are moist. Posterior pharynx clear of any exudate or lesions.Normal dentition.  Neck: normal, supple, no masses, no thyromegaly Respiratory: clear to auscultation bilaterally, no wheezing, no crackles. Normal respiratory effort. No accessory muscle use.  Cardiovascular: Regular rate and rhythm, no murmurs /  rubs / gallops. No extremity edema. 2+ pedal pulses. No carotid bruits.  Abdomen: no tenderness, no masses palpated. No hepatosplenomegaly. Bowel sounds positive.  Musculoskeletal: no clubbing / cyanosis. No joint deformity upper and lower extremities. Good ROM, no contractures. Normal muscle tone.  Skin: Swelling discoloration and callus like changes on big toe with 2 skin openings on the top of ITP and MTP area. Neurologic: CN 2-12 grossly intact. Sensation intact, DTR normal. Strength 5/5 in all 4.  Psychiatric: Normal judgment and insight. Alert and oriented x 3. Normal mood.     Labs on Admission: I have personally reviewed following labs and imaging studies  CBC: Recent Labs  Lab 10/29/23 1550 10/31/23 0847  WBC 13.5* 9.5  NEUTROABS 10.6* 5.9  HGB 11.2* 11.0*  HCT 36.1 35.9*  MCV 90.9 93.0  PLT 280 283   Basic Metabolic Panel: Recent Labs  Lab 10/29/23 1550 10/31/23 0847  NA 130* 134*  K 5.2* 4.5  CL 99 104  CO2 21* 22  GLUCOSE 444* 181*  BUN 24* 27*  CREATININE 1.44* 1.47*  CALCIUM  8.3* 8.1*   GFR: Estimated Creatinine Clearance: 48.3 mL/min (A) (by C-G formula based on SCr of 1.47 mg/dL (H)). Liver Function Tests: Recent Labs  Lab 10/29/23 1550 10/31/23 0847  AST 25 22  ALT 24 22  ALKPHOS 78 79  BILITOT 0.3 0.2  PROT 7.3 7.5  ALBUMIN 3.5 3.6   No results for input(s): LIPASE, AMYLASE in the last 168 hours. No results for input(s): AMMONIA in the last 168 hours. Coagulation Profile: No results for input(s): INR, PROTIME in the last 168 hours. Cardiac Enzymes: No results for input(s): CKTOTAL, CKMB, CKMBINDEX, TROPONINI in the last 168 hours. BNP (last 3 results) No results for input(s): PROBNP in the last 8760 hours. HbA1C: No results for input(s): HGBA1C in the last 72 hours. CBG: No results for input(s): GLUCAP in the last 168 hours. Lipid Profile: No results for input(s): CHOL, HDL, LDLCALC, TRIG, CHOLHDL,  LDLDIRECT in the  last 72 hours. Thyroid  Function Tests: No results for input(s): TSH, T4TOTAL, FREET4, T3FREE, THYROIDAB in the last 72 hours. Anemia Panel: No results for input(s): VITAMINB12, FOLATE, FERRITIN, TIBC, IRON , RETICCTPCT in the last 72 hours. Urine analysis:    Component Value Date/Time   COLORURINE YELLOW (A) 04/26/2019 1650   APPEARANCEUR CLEAR (A) 04/26/2019 1650   LABSPEC 1.013 04/26/2019 1650   PHURINE 7.0 04/26/2019 1650   GLUCOSEU >=500 (A) 04/26/2019 1650   HGBUR MODERATE (A) 04/26/2019 1650   BILIRUBINUR NEGATIVE 04/26/2019 1650   KETONESUR 5 (A) 04/26/2019 1650   PROTEINUR NEGATIVE 04/26/2019 1650   NITRITE NEGATIVE 04/26/2019 1650   LEUKOCYTESUR SMALL (A) 04/26/2019 1650    Radiological Exams on Admission: DG Foot Complete Right Result Date: 10/31/2023 CLINICAL DATA:  Right foot pain for 3 weeks without reported injury. EXAM: RIGHT FOOT COMPLETE - 3+ VIEW COMPARISON:  November 29, 2020. FINDINGS: There is lucency seen involving the first distal phalanx concerning for possible osteomyelitis. Joint spaces are intact. Mild posterior calcaneal spurring is noted. Vascular calcifications are noted. IMPRESSION: Findings concerning for osteomyelitis involving first distal phalanx. Electronically Signed   By: Lynwood Landy Raddle M.D.   On: 10/31/2023 12:23    EKG: None  Assessment/Plan Principal Problem:   Toe osteomyelitis, right (HCC) Active Problems:   Osteomyelitis (HCC)  (please populate well all problems here in Problem List. (For example, if patient is on BP meds at home and you resume or decide to hold them, it is a problem that needs to be her. Same for CAD, COPD, HLD and so on)   Right big toe osteomyelitis Right big toe cellulitis -Continue vancomycin  and Zosyn  -Blood culture x 2 -Send a secure text to on-call podiatry Dr. Lennie -Other DDx, underlying gout cannot be ruled out, will check uric acid level.  IDDM -Glucose fairly  controlled -SSI  HTN Chronic lymphedema -On chronic Lasix , BP controlled.  CKD stage IIIb -Euvolemic, creatinine level stable -Continue Lasix   GERD -Continue PPI and Carafate  DVT prophylaxis: Lovenox  Code Status: Full code Family Communication: Bedside husband Disposition Plan: Patient is sick with right big toe osteomyelitis failed outpatient management, requiring IV antibiotics and inpatient podiatry consultation, expect more than 2 midnight hospital stay Consults called: Message sent to podiatry Admission status: MedSurg admission   Cort ONEIDA Mana MD [facility] Pager [phone]  10/31/2023, 12:42 PM

## 2023-10-31 NOTE — ED Notes (Signed)
 Pharmacy called about Pts meds to be verified, their volumes are high. Will verify meds with pts when they get a chance Pt aware.

## 2023-10-31 NOTE — ED Notes (Signed)
 Foam lift dressing applied to sacrum.

## 2023-10-31 NOTE — Inpatient Diabetes Management (Addendum)
 Inpatient Diabetes Program Recommendations  AACE/ADA: New Consensus Statement on Inpatient Glycemic Control (2015)  Target Ranges:  Prepandial:   less than 140 mg/dL      Peak postprandial:   less than 180 mg/dL (1-2 hours)      Critically ill patients:  140 - 180 mg/dL    Latest Reference Range & Units 10/31/23 08:47  Sodium 135 - 145 mmol/L 134 (L)  Potassium 3.5 - 5.1 mmol/L 4.5  Chloride 98 - 111 mmol/L 104  CO2 22 - 32 mmol/L 22  Glucose 70 - 99 mg/dL 818 (H)  BUN 6 - 20 mg/dL 27 (H)  Creatinine 9.55 - 1.00 mg/dL 8.52 (H)  Calcium  8.9 - 10.3 mg/dL 8.1 (L)  Anion gap 5 - 15  8  (L): Data is abnormally low (H): Data is abnormally high    To ED with Cellulitis of right lower extremity   History: Type 1 Diabetes  Home Meds: OmniPod 5 Insulin  Pump (uses HUmalog  U200 Insulin  in the pump)            Dexcom G6 CGM            Wegovy 0.5 mg Qweek   MD- If pt admitted, please allow her to use her home insulin  pump.  Please place orders for Insulin  Pump Therapy TID AC + HS + 2am   Addendum 1pm--Met w/ pt down in the ED.  Husband also present.  Pt A&O--Able to manage insulin  pump independently.  Has Omnipod 5 Insulin  pump pod on--Needs to change the pod 8am 02/06.  Husband to bring extra pump supplies.  Pt confirmed she still sees Dr. Damian and uses the Humalog  U200 insulin  in her pump.  Just put on new Dexcom G6 sensor 1 day ago.  Pt understands that RN to check fingerstick CBG with hospital meter as well and agreeable.  Notified MD via Secure Chat that pt wishes to use Insulin  pump and permission given to me to d/c Novolog  SSi orders and place orders for insulin  pump--RN notified of orders as well.    ENDO: Dr. Damian Last Seen 07/03/2023 --Continue insulin  pump --Continue use of the Humalog  U200 in her OmniPod pump --New settings: Basal rate  12am 1.2 units/hr  4 am 1.55 units/hr 24-hr basal = 35 units of U200 insulin  (equals 70 units of U100 insulin )  Bolus Sensitivity  28 Correction Threshold 120 BG target 110 Insulin : Carb ratio 10   --Start Wegovy 0.25 mg weekly--After 4 weeks, will adjust to 0.5 mg weekly     --Will follow patient during hospitalization--  Adina Rudolpho Arrow RN, MSN, CDCES Diabetes Coordinator Inpatient Glycemic Control Team Team Pager: [phone] (8a-5p)

## 2023-11-01 ENCOUNTER — Encounter
Admission: EM | Disposition: A | Discharge status: Home / Self Care | Payer: Self-pay | Source: Home / Self Care | Attending: Internal Medicine

## 2023-11-01 DIAGNOSIS — Z794 Long term (current) use of insulin: Secondary | ICD-10-CM

## 2023-11-01 DIAGNOSIS — I70235 Atherosclerosis of native arteries of right leg with ulceration of other part of foot: Secondary | ICD-10-CM

## 2023-11-01 DIAGNOSIS — E11628 Type 2 diabetes mellitus with other skin complications: Secondary | ICD-10-CM

## 2023-11-01 DIAGNOSIS — L089 Local infection of the skin and subcutaneous tissue, unspecified: Secondary | ICD-10-CM

## 2023-11-01 DIAGNOSIS — M869 Osteomyelitis, unspecified: Secondary | ICD-10-CM

## 2023-11-01 DIAGNOSIS — L97519 Non-pressure chronic ulcer of other part of right foot with unspecified severity: Secondary | ICD-10-CM | POA: Diagnosis not present

## 2023-11-01 DIAGNOSIS — L03115 Cellulitis of right lower limb: Secondary | ICD-10-CM

## 2023-11-01 HISTORY — PX: LOWER EXTREMITY ANGIOGRAPHY: CATH118251

## 2023-11-01 LAB — GLUCOSE, CAPILLARY
Glucose-Capillary: 159 mg/dL — ABNORMAL HIGH (ref 70–99)
Glucose-Capillary: 99 mg/dL (ref 70–99)
Glucose-Capillary: 107 mg/dL — ABNORMAL HIGH (ref 70–99)
Glucose-Capillary: 206 mg/dL — ABNORMAL HIGH (ref 70–99)
Glucose-Capillary: 411 mg/dL — ABNORMAL HIGH (ref 70–99)

## 2023-11-01 LAB — CREATININE, SERUM
Creatinine, Ser: 1.48 mg/dL — ABNORMAL HIGH (ref 0.44–1.00)
GFR, Estimated: 41 mL/min — ABNORMAL LOW (ref >60)

## 2023-11-01 SURGERY — LOWER EXTREMITY ANGIOGRAPHY
Anesthesia: Moderate Sedation | Laterality: Right

## 2023-11-01 MED ORDER — METHYLPREDNISOLONE SODIUM SUCC 125 MG IJ SOLR: 125.0000 mg | Freq: Once | INTRAMUSCULAR | Status: DC | PRN

## 2023-11-01 MED ORDER — MIDAZOLAM HCL 2 MG/ML PO SYRP: 8.0000 mg | ORAL_SOLUTION | Freq: Once | ORAL | Status: DC | PRN

## 2023-11-01 MED ORDER — IODIXANOL 320 MG/ML IV SOLN
INTRAVENOUS | Status: DC | PRN
  Administered 2023-11-01: 45 mL via INTRA_ARTERIAL

## 2023-11-01 MED ORDER — CEFAZOLIN SODIUM-DEXTROSE 1-4 GM/50ML-% IV SOLN
INTRAVENOUS | Status: AC
  Filled 2023-11-01: qty 50

## 2023-11-01 MED ORDER — IPRATROPIUM BROMIDE 0.02 % IN SOLN
0.5000 mg | Freq: Three times a day (TID) | RESPIRATORY_TRACT | Status: DC
  Administered 2023-11-01: 0.5 mg via RESPIRATORY_TRACT
  Filled 2023-11-01 (×2): qty 2.5

## 2023-11-01 MED ORDER — ROSUVASTATIN CALCIUM 10 MG PO TABS
10.0000 mg | ORAL_TABLET | Freq: Every day | ORAL | Status: DC
  Administered 2023-11-01 – 2023-11-02 (×2): 10 mg via ORAL
  Filled 2023-11-01 (×2): qty 1

## 2023-11-01 MED ORDER — FENTANYL CITRATE (PF) 100 MCG/2ML IJ SOLN
INTRAMUSCULAR | Status: DC | PRN
  Administered 2023-11-01: 50 ug via INTRAVENOUS

## 2023-11-01 MED ORDER — CLOPIDOGREL BISULFATE 75 MG PO TABS
75.0000 mg | ORAL_TABLET | Freq: Every day | ORAL | Status: DC
  Administered 2023-11-01 – 2023-11-02 (×2): 75 mg via ORAL
  Filled 2023-11-01 (×2): qty 1

## 2023-11-01 MED ORDER — HEPARIN (PORCINE) IN NACL 2000-0.9 UNIT/L-% IV SOLN
INTRAVENOUS | Status: DC | PRN
  Administered 2023-11-01: 1000 mL

## 2023-11-01 MED ORDER — CEFAZOLIN SODIUM-DEXTROSE 2-4 GM/100ML-% IV SOLN: 2.0000 g | INTRAVENOUS | Status: DC

## 2023-11-01 MED ORDER — MIDAZOLAM HCL 5 MG/5ML IJ SOLN
INTRAMUSCULAR | Status: AC
  Filled 2023-11-01: qty 5

## 2023-11-01 MED ORDER — LIDOCAINE-EPINEPHRINE (PF) 1 %-1:200000 IJ SOLN
INTRAMUSCULAR | Status: DC | PRN
  Administered 2023-11-01: 10 mL via INTRADERMAL

## 2023-11-01 MED ORDER — IPRATROPIUM BROMIDE 0.02 % IN SOLN
0.5000 mg | Freq: Four times a day (QID) | RESPIRATORY_TRACT | Status: DC
  Administered 2023-11-01: 0.5 mg via RESPIRATORY_TRACT
  Filled 2023-11-01: qty 2.5

## 2023-11-01 MED ORDER — HEPARIN SODIUM (PORCINE) 1000 UNIT/ML IJ SOLN
INTRAMUSCULAR | Status: DC | PRN
  Administered 2023-11-01: 5000 [IU] via INTRAVENOUS

## 2023-11-01 MED ORDER — FENTANYL CITRATE PF 50 MCG/ML IJ SOSY: 12.5000 ug | PREFILLED_SYRINGE | Freq: Once | INTRAMUSCULAR | Status: DC | PRN

## 2023-11-01 MED ORDER — SODIUM CHLORIDE 0.9 % IV SOLN: INTRAVENOUS | Status: DC

## 2023-11-01 MED ORDER — HEPARIN SODIUM (PORCINE) 1000 UNIT/ML IJ SOLN
INTRAMUSCULAR | Status: AC
  Filled 2023-11-01: qty 10

## 2023-11-01 MED ORDER — DIPHENHYDRAMINE HCL 50 MG/ML IJ SOLN: 50.0000 mg | Freq: Once | INTRAMUSCULAR | Status: DC | PRN

## 2023-11-01 MED ORDER — MIDAZOLAM HCL 2 MG/2ML IJ SOLN
INTRAMUSCULAR | Status: DC | PRN
  Administered 2023-11-01: 2 mg via INTRAVENOUS

## 2023-11-01 MED ORDER — FENTANYL CITRATE (PF) 100 MCG/2ML IJ SOLN
INTRAMUSCULAR | Status: AC
  Filled 2023-11-01: qty 2

## 2023-11-01 MED ORDER — ALUM & MAG HYDROXIDE-SIMETH 200-200-20 MG/5ML PO SUSP
30.0000 mL | ORAL | Status: DC | PRN
  Administered 2023-11-01: 30 mL via ORAL
  Filled 2023-11-01: qty 30

## 2023-11-01 MED ORDER — CEFAZOLIN SODIUM-DEXTROSE 1-4 GM/50ML-% IV SOLN
1.0000 g | INTRAVENOUS | Status: DC
  Filled 2023-11-01: qty 50

## 2023-11-01 SURGICAL SUPPLY — 17 items
BALLN LUTONIX 5X220X130 (BALLOONS) ×2 IMPLANT
BALLN LUTONIX DCB 4X80X130 (BALLOONS) ×1 IMPLANT
BALLOON LUTONIX 5X220X130 (BALLOONS) IMPLANT
BALLOON LUTONIX DCB 4X80X130 (BALLOONS) IMPLANT
CATH ANGIO 5F PIGTAIL 65CM (CATHETERS) IMPLANT
CATH VERT 5X100 (CATHETERS) IMPLANT
COVER PROBE ULTRASOUND 5X96 (MISCELLANEOUS) IMPLANT
DEVICE PRESTO INFLATION (MISCELLANEOUS) IMPLANT
DEVICE STARCLOSE SE CLOSURE (Vascular Products) IMPLANT
GLIDEWIRE ADV .035X260CM (WIRE) IMPLANT
LIFESTENT SOLO 6X200X135 (Permanent Stent) IMPLANT
PACK ANGIOGRAPHY (CUSTOM PROCEDURE TRAY) ×2 IMPLANT
SHEATH ANL2 6FRX45 HC (SHEATH) IMPLANT
SHEATH BRITE TIP 5FRX11 (SHEATH) IMPLANT
SYR MEDRAD MARK 7 150ML (SYRINGE) IMPLANT
TUBING CONTRAST HIGH PRESS 72 (TUBING) IMPLANT
WIRE GUIDERIGHT .035X150 (WIRE) IMPLANT

## 2023-11-01 NOTE — Consult Note (Signed)
 NAME: Emily Collins  DOB: 04-07-1966  MRN: [id number]  Date/Time: 11/01/2023 2:24 PM  REQUESTING PROVIDER: Dr.Baker Subjective:  REASON FOR CONSULT: rt great toe infection ? Emily Collins is a 58 y.o. female with a history of diabetes mellitus, hypertension, hyperlipidemia, lymphedema, venous insufficiency presented to the ED on 10/31/2023 with symptoms of Right big toe swelling and erythema for the past 3 weeks.  She had gone to urgent care previously and was told that it could be gout or infection.  She never had a history of gout.  Patient was given a course of Keflex .  She has slightly elevated uric acid.  As the pain and swelling of the toe was sent she came to the ED. In the ED vitals BP of 136/61, temperature 98.1, pulse 71, respiratory rate 19 and sats of 100%. WBC was 9.5, Hb 11, platelet 283 and creatinine was 1.47.  An x-ray was done and that showed lucency involving the first distal phalanx concerning for possible osteomyelitis. Blood culture was sent Patient was started on vancomycin  and Zosyn . She was seen by podiatrist who did debridement at bedside and sent cultures She went for vascular angiogram today and underwent angioplasty of the right tibioperoneal trunk, right SFA and stent placement to the right SFA.  I am asked to see the patient for infection of the right toe  She has had a few surgeries to the rt foot in 2022 Past Medical History:  Diagnosis Date   Allergy    Anxiety    Arthritis    Asthma    Depression    Diabetes mellitus without complication (HCC)    GERD (gastroesophageal reflux disease)    History of chicken pox 04/07/2015   DID have Chicken Pox.     Hyperlipidemia    Hypertension    Neuromuscular disorder (HCC)    Venous (peripheral) insufficiency     Past Surgical History:  Procedure Laterality Date   APPENDECTOMY  1979   APPLICATION OF WOUND VAC Right 01/07/2021   Procedure: APPLICATION OF WOUND VAC;  Surgeon: Silva Juliene SAUNDERS, DPM;  Location:  [facility];  Service: Podiatry;  Laterality: Right;   BILATERAL CARPAL TUNNEL RELEASE Bilateral    Dr. Kathlynn   CESAREAN SECTION  2003   GRAFT APPLICATION Right 01/07/2021   Procedure: GRAFT APPLICATION;  Surgeon: Silva Juliene SAUNDERS, DPM;  Location: [facility];  Service: Podiatry;  Laterality: Right;   INCISION AND DRAINAGE Right 12/01/2020   Procedure: INCISION AND DRAINAGE;  Surgeon: Silva Juliene SAUNDERS, DPM;  Location: [facility];  Service: Podiatry;  Laterality: Right;   INCISION AND DRAINAGE Right 12/07/2020   Procedure: INCISION AND DRAINAGE;  Surgeon: Janit Thresa HERO, DPM;  Location: [facility];  Service: Podiatry;  Laterality: Right;   INCISION AND DRAINAGE OF WOUND Right 01/07/2021   Procedure: IRRIGATION AND DEBRIDEMENT WOUND;  Surgeon: Silva Juliene SAUNDERS, DPM;  Location: [facility];  Service: Podiatry;  Laterality: Right;  regional block if possible   LAPAROSCOPIC LYSIS OF ADHESIONS     SHOULDER SURGERY Right 08/2009   Dr. Kathlynn   TUBAL LIGATION      Social History   Socioeconomic History   Marital status: Married    Spouse name: John   Number of children: 3   Years of education: H/S   Highest education level: Not on file  Occupational History   Occupation: Housewife  Tobacco Use   Smoking status: Former    Current packs/day: 0.00    Average packs/day: 1  pack/day for 32.5 years (32.5 ttl pk-yrs)    Types: Cigarettes    Start date: 04/06/1985    Quit date: 09/25/2017    Years since quitting: 6.1   Smokeless tobacco: Never  Vaping Use   Vaping status: Never Used  Substance and Sexual Activity   Alcohol use: Yes    Alcohol/week: 1.0 - 2.0 standard drink of alcohol    Types: 1 - 2 Glasses of wine per week    Comment: Socially   Drug use: No   Sexual activity: Not on file  Other Topics Concern   Not on file  Social History Narrative   Not on file   Social Drivers of Health   Financial Resource Strain: Not on file  Food Insecurity: No Food Insecurity (10/31/2023)   Hunger Vital Sign     Worried About Running Out of Food in the Last Year: Never true    Ran Out of Food in the Last Year: Never true  Transportation Needs: No Transportation Needs (10/31/2023)   PRAPARE - Administrator, Civil Service (Medical): No    Lack of Transportation (Non-Medical): No  Physical Activity: Not on file  Stress: Not on file  Social Connections: Not on file  Intimate Partner Violence: Not At Risk (10/31/2023)   Humiliation, Afraid, Rape, and Kick questionnaire    Fear of Current or Ex-Partner: No    Emotionally Abused: No    Physically Abused: No    Sexually Abused: No    Family History  Problem Relation Age of Onset   Hypertension Mother    Diabetes Mother    Hyperlipidemia Mother    Arthritis Mother    Heart disease Father    Kidney disease Father    Allergies  Allergen Reactions   Atorvastatin      Muscle and join pains.   Levaquin   [Levofloxacin  In D5w]     GI upset   Simvastatin     joint aches.   Dilaudid  [Hydromorphone Hcl] Hives, Itching and Rash   Tetracycline Rash   I? Current Facility-Administered Medications  Medication Dose Route Frequency Provider Last Rate Last Admin   0.9 %  sodium chloride  infusion   Intravenous Continuous Pace, Brien R, NP       [MAR Hold] acetaminophen  (TYLENOL ) tablet 650 mg  650 mg Oral Q6H PRN Laurita Manor T, MD   650 mg at 10/31/23 1443   Or   [MAR Hold] acetaminophen  (TYLENOL ) suppository 650 mg  650 mg Rectal Q6H PRN Laurita Manor DASEN, MD       [MAR Hold] albuterol  (PROVENTIL ) (2.5 MG/3ML) 0.083% nebulizer solution 3 mL  3 mL Inhalation Q6H PRN Laurita Manor DASEN, MD       [facility] Hold] amitriptyline  (ELAVIL ) tablet 25 mg  25 mg Oral QHS PRN Laurita Manor DASEN, MD       [facility] Hold] aspirin  EC tablet 81 mg  81 mg Oral Daily Laurita Manor T, MD   81 mg at 11/01/23 0900   ceFAZolin  (ANCEF ) IVPB 1 g/50 mL premix  1 g Intravenous 30 min Pre-Op Dew, Jason S, MD       [facility] Hold] cyclobenzaprine  (FLEXERIL ) tablet 5 mg  5 mg Oral QHS PRN Laurita Manor DASEN,  MD       diphenhydrAMINE  (BENADRYL ) injection 50 mg  50 mg Intravenous Once PRN Pace, Brien R, NP       [MAR Hold] docusate sodium  (COLACE) capsule 100 mg  100 mg Oral Daily PRN  Laurita Cort DASEN, MD       [facility] Hold] enoxaparin  (LOVENOX ) injection 50 mg  50 mg Subcutaneous Q24H Laurita Cort T, MD   50 mg at 10/31/23 2250   [facility] Hold] ezetimibe  (ZETIA ) tablet 10 mg  10 mg Oral Daily Zhang, Ping T, MD   10 mg at 11/01/23 0900   [MAR Hold] famotidine  (PEPCID ) tablet 40 mg  40 mg Oral Daily Laurita Cort T, MD   40 mg at 11/01/23 0900   fentaNYL  (SUBLIMAZE ) injection 12.5 mcg  12.5 mcg Intravenous Once PRN Pace, Brien R, NP       [MAR Hold] FLUoxetine  (PROZAC ) capsule 40 mg  40 mg Oral Daily Zhang, Ping T, MD   40 mg at 11/01/23 0900   [MAR Hold] furosemide  (LASIX ) tablet 20 mg  20 mg Oral Daily Zhang, Ping T, MD   20 mg at 11/01/23 0900   [MAR Hold] gabapentin  (NEURONTIN ) capsule 600 mg  600 mg Oral TID Laurita Cort T, MD   600 mg at 11/01/23 0900   [MAR Hold] hydrOXYzine  (ATARAX ) tablet 10 mg  10 mg Oral TID PRN Laurita Cort DASEN, MD   10 mg at 11/01/23 1006   [MAR Hold] insulin  pump   Subcutaneous TID WC, HS, 0200 Zhang, Ping T, MD   Given at 11/01/23 0904   [MAR Hold] ipratropium (ATROVENT ) nebulizer solution 0.5 mg  0.5 mg Nebulization TID Sreenath, Sudheer B, MD       [MAR Hold] loratadine  (CLARITIN ) tablet 10 mg  10 mg Oral Daily Zhang, Ping T, MD   10 mg at 11/01/23 0900   [MAR Hold] melatonin tablet 10 mg  10 mg Oral QHS PRN Laurita Cort DASEN, MD       methylPREDNISolone  sodium succinate (SOLU-MEDROL ) 125 mg/2 mL injection 125 mg  125 mg Intravenous Once PRN Pace, Brien R, NP       midazolam  (VERSED ) 2 MG/ML syrup 8 mg  8 mg Oral Once PRN Pace, Brien R, NP       [MAR Hold] ondansetron  (ZOFRAN ) tablet 4 mg  4 mg Oral Q6H PRN Laurita Cort DASEN, MD       Or   [facility] Hold] ondansetron  (ZOFRAN ) injection 4 mg  4 mg Intravenous Q6H PRN Laurita Cort DASEN, MD       [MAR Hold] oxyCODONE  (Oxy IR/ROXICODONE ) immediate release  tablet 5 mg  5 mg Oral Q4H PRN Laurita Cort T, MD   5 mg at 11/01/23 0909   [MAR Hold] pantoprazole  (PROTONIX ) EC tablet 40 mg  40 mg Oral Daily Zhang, Ping T, MD   40 mg at 11/01/23 0900   [MAR Hold] piperacillin -tazobactam (ZOSYN ) IVPB 3.375 g  3.375 g Intravenous Q8H Lenon Elsie HERO, RPH 12.5 mL/hr at 11/01/23 0545 3.375 g at 11/01/23 0545   [MAR Hold] vancomycin  (VANCOCIN ) IVPB 1000 mg/200 mL premix  1,000 mg Intravenous Q24H Lenon Elsie HERO, RPH         Abtx:  Anti-infectives (From admission, onward)    Start     Dose/Rate Route Frequency Ordered Stop   11/02/23 1000  vancomycin  (VANCOREADY) IVPB 1750 mg/350 mL  Status:  Discontinued        1,750 mg 175 mL/hr over 120 Minutes Intravenous Every 48 hours 10/31/23 1224 10/31/23 1231   11/01/23 1800  [MAR Hold]  vancomycin  (VANCOCIN ) IVPB 1000 mg/200 mL premix        (MAR Hold since Thu 11/01/2023 at 1421.Hold Reason: Transfer to a Procedural area)  1,000 mg 200 mL/hr over 60 Minutes Intravenous Every 24 hours 10/31/23 1231     11/01/23 1408  ceFAZolin  (ANCEF ) IVPB 1 g/50 mL premix        1 g 100 mL/hr over 30 Minutes Intravenous 30 min pre-op 11/01/23 1408     11/01/23 1355  ceFAZolin  (ANCEF ) IVPB 2g/100 mL premix  Status:  Discontinued        2 g 200 mL/hr over 30 Minutes Intravenous 30 min pre-op 11/01/23 1355 11/01/23 1408   10/31/23 1445  vancomycin  (VANCOREADY) IVPB 500 mg/100 mL       Placed in Followed by Linked Group   500 mg 100 mL/hr over 60 Minutes Intravenous  Once 10/31/23 1224 10/31/23 2144   10/31/23 1400  [MAR Hold]  piperacillin -tazobactam (ZOSYN ) IVPB 3.375 g        (MAR Hold since Thu 11/01/2023 at 1421.Hold Reason: Transfer to a Procedural area)   3.375 g 12.5 mL/hr over 240 Minutes Intravenous Every 8 hours 10/31/23 1242     10/31/23 1245  vancomycin  (VANCOREADY) IVPB 1750 mg/350 mL       Placed in Followed by Linked Group   1,750 mg 175 mL/hr over 120 Minutes Intravenous  Once 10/31/23 1224 10/31/23  2144   10/31/23 1145  cefTRIAXone  (ROCEPHIN ) 1 g in sodium chloride  0.9 % 100 mL IVPB        1 g 200 mL/hr over 30 Minutes Intravenous  Once 10/31/23 1141 10/31/23 1319       REVIEW OF SYSTEMS:  Const: negative fever, negative chills, negative weight loss Eyes: negative diplopia or visual changes, negative eye pain ENT: negative coryza, negative sore throat Resp: negative cough, hemoptysis, dyspnea Cards: negative for chest pain, palpitations, lower extremity edema GU: negative for frequency, dysuria and hematuria GI: Negative for abdominal pain, diarrhea, bleeding, constipation Skin: negative for rash and pruritus Heme: negative for easy bruising and gum/nose bleeding MS:  weakness Neurolo:negative for headaches, dizziness, vertigo, memory problems  Psych: anxiety, depression  Endocrine:  diabetes Allergy/Immunology- as above : Objective:  VITALS:  BP (!) 121/55 (BP Location: Left Arm)   Pulse 66   Temp 98.1 F (36.7 C) (Oral)   Resp 17   Ht 5' 2 (1.575 m)   Wt 108 kg   LMP 07/15/2018 (Approximate)   SpO2 96%   BMI 43.55 kg/m   PHYSICAL EXAM:  General: Alert, cooperative, no distress, appears stated age.  Head: Normocephalic, without obvious abnormality, atraumatic. Eyes: Conjunctivae clear, anicteric sclerae. Pupils are equal ENT Nares normal. No drainage or sinus tenderness. Lips, mucosa, and tongue normal. No Thrush Neck: Supple, symmetrical, no adenopathy, thyroid : non tender no carotid bruit and no JVD. Back: No CVA tenderness. Lungs: Clear to auscultation bilaterally. No Wheezing or Rhonchi. No rales. Heart: Regular rate and rhythm, no murmur, rub or gallop. Abdomen: Soft, non-tender,not distended. Bowel sounds normal. No masses Extremities:      Great toe- swollen- scaling some erythema Skin: No rashes or lesions. Or bruising Lymph: Cervical, supraclavicular normal. Neurologic: Grossly non-focal Pertinent Labs Lab Results CBC    Component Value  Date/Time   WBC 9.5 10/31/2023 0847   RBC 3.86 (L) 10/31/2023 0847   HGB 11.0 (L) 10/31/2023 0847   HCT 35.9 (L) 10/31/2023 0847   PLT 283 10/31/2023 0847   MCV 93.0 10/31/2023 0847   MCH 28.5 10/31/2023 0847   MCHC 30.6 10/31/2023 0847   RDW 14.1 10/31/2023 0847   LYMPHSABS 2.3 10/31/2023 0847   MONOABS 0.5  10/31/2023 0847   EOSABS 0.7 (H) 10/31/2023 0847   BASOSABS 0.1 10/31/2023 0847       Latest Ref Rng & Units 11/01/2023    4:02 AM 10/31/2023    8:47 AM 10/29/2023    3:50 PM  CMP  Glucose 70 - 99 mg/dL  818  555   BUN 6 - 20 mg/dL  27  24   Creatinine 9.55 - 1.00 mg/dL 8.51  8.52  8.55   Sodium 135 - 145 mmol/L  134  130   Potassium 3.5 - 5.1 mmol/L  4.5  5.2   Chloride 98 - 111 mmol/L  104  99   CO2 22 - 32 mmol/L  22  21   Calcium  8.9 - 10.3 mg/dL  8.1  8.3   Total Protein 6.5 - 8.1 g/dL  7.5  7.3   Total Bilirubin 0.0 - 1.2 mg/dL  0.2  0.3   Alkaline Phos 38 - 126 U/L  79  78   AST 15 - 41 U/L  22  25   ALT 0 - 44 U/L  22  24       Microbiology: Recent Results (from the past 240 hours)  MRSA Next Gen by PCR, Nasal     Status: Abnormal   Collection Time: 10/31/23  2:42 PM   Specimen: Nasal Mucosa; Nasal Swab  Result Value Ref Range Status   MRSA by PCR Next Gen DETECTED (A) NOT DETECTED Final    Comment: RESULT CALLED TO, READ BACK BY AND VERIFIED WITH: JESS SAVAGE 10/31/23 1826 KLW (NOTE) The GeneXpert MRSA Assay (FDA approved for NASAL specimens only), is one component of a comprehensive MRSA colonization surveillance program. It is not intended to diagnose MRSA infection nor to guide or monitor treatment for MRSA infections. Test performance is not FDA approved in patients less than 68 years old. Performed at [facility], [address] [place], [place] [address]   Culture, blood (Routine X 2) w Reflex to ID Panel     Status: None (Preliminary result)   Collection Time: 10/31/23  2:42 PM   Specimen: BLOOD LEFT ARM  Result Value Ref Range  Status   Specimen Description BLOOD LEFT ARM  Final   Special Requests   Final    BOTTLES DRAWN AEROBIC AND ANAEROBIC Blood Culture results may not be optimal due to an inadequate volume of blood received in culture bottles   Culture   Final    NO GROWTH < 24 HOURS Performed at [facility], [address], [place], [place] [address]    Report Status PENDING  Incomplete  Culture, blood (Routine X 2) w Reflex to ID Panel     Status: None (Preliminary result)   Collection Time: 10/31/23  2:42 PM   Specimen: BLOOD RIGHT ARM  Result Value Ref Range Status   Specimen Description BLOOD RIGHT ARM  Final   Special Requests   Final    BOTTLES DRAWN AEROBIC AND ANAEROBIC Blood Culture results may not be optimal due to an inadequate volume of blood received in culture bottles   Culture   Final    NO GROWTH < 24 HOURS Performed at [facility], [address] [place], [place] [address]    Report Status PENDING  Incomplete  Aerobic/Anaerobic Culture w Gram Stain (surgical/deep wound)     Status: None (Preliminary result)   Collection Time: 10/31/23  6:29 PM   Specimen: Toe; Wound  Result Value Ref Range Status  Specimen Description   Final    TOE Performed at [facility], [address] [place], [place] [address]    Special Requests   Final    RIGHT TOE Performed at [facility], [address] [place], [place] [address]    Gram Stain NO WBC SEEN NO ORGANISMS SEEN   Final   Culture   Final    NO GROWTH < 12 HOURS Performed at [facility], [address], [place], [place] [address]    Report Status PENDING  Incomplete    IMAGING RESULTS: Xray reviewed- questioning lucency first distal phalanx   I have personally reviewed the films ? Impression/Recommendation 58 year old female with history of diabetes mellitus with a hemoglobin A1c of 8.2 presents with right great toe swelling and the wound for last 3 to 4 weeks Right great toe  soft tissue infection started as a split in the skin Even though x-ray questions lucency of the distal phalanx, clinically does not look like osteomyelitis May benefit from a MRI Patient is currently on vancomycin  and Zosyn .  She is an ideal candidate for linezolid  but is on Prozac  and Elavil . Will change the Zosyn  to Unasyn .  Diabetes mellitus has an insulin  pump  Hyperlipidemia on rosuvastatin  and Zetia   CKD  __I have personally spent  -60--minutes involved in face-to-face and non-face-to-face activities for this patient on the day of the visit. Professional time spent includes the following activities: Preparing to see the patient (review of tests), Obtaining and/or reviewing separately obtained history (admission/discharge record), Performing a medically appropriate examination and/or evaluation , Ordering medications/tests/procedures, referring and communicating with other health care professionals, Documenting clinical information in the EMR, Independently interpreting results (not separately reported), Communicating results to the patient/family/caregiver, Counseling and educating the patient/family/caregiver and Care coordination (not separately reported).    ________________________________________________ Discussed with patient, requesting provider Note:  This document was prepared using Dragon voice recognition software and may include unintentional dictation errors.

## 2023-11-01 NOTE — Progress Notes (Signed)
 Patient returns from Vascular, dressing to left groin CDI, area soft. Patient has been instructed to lay flat until 1745 and can be up at 1815. Husband at bedside.

## 2023-11-01 NOTE — Op Note (Signed)
 [facility]  Percutaneous Study/Intervention Procedural Note   Date of Surgery: 11/01/2023  Surgeon(s):Elenna Spratling    Assistants:none  Pre-operative Diagnosis: PAD with ulceration right lower extremity  Post-operative diagnosis:  Same  Procedure(s) Performed:             1.  Ultrasound guidance for vascular access left femoral artery             2.  Catheter placement into right common femoral artery from left femoral approach             3.  Aortogram and selective right lower extremity angiogram             4.  Percutaneous transluminal angioplasty of right tibioperoneal trunk with 4 mm diameter by 8 cm length Lutonix drug-coated angioplasty balloon             5.  Percutaneous transluminal angioplasty of the right SFA with 5 mm diameter by 22 cm length Lutonix drug-coated angioplasty balloon  6.  Stent placement to the right SFA with 6 mm diameter by 20 cm length life stent             7.  StarClose closure device left femoral artery  EBL: 10 cc  Contrast: 45 cc  Fluoro Time: 3.5 minutes  Moderate Conscious Sedation Time: approximately 28 minutes using 2 mg of Versed  and 50 mcg of Fentanyl               Indications:  Patient is a 58 y.o.female with nonhealing ulceration on the right foot. The patient is brought in for angiography for further evaluation and potential treatment.  Due to the limb threatening nature of the situation, angiogram was performed for attempted limb salvage. The patient is aware that if the procedure fails, amputation would be expected.  The patient also understands that even with successful revascularization, amputation may still be required due to the severity of the situation.  Risks and benefits are discussed and informed consent is obtained.   Procedure:  The patient was identified and appropriate procedural time out was performed.  The patient was then placed supine on the table and prepped and draped in the usual sterile fashion.  Moderate conscious sedation was administered during a face to face encounter with the patient throughout the procedure with my supervision of the RN administering medicines and monitoring the patient's vital signs, pulse oximetry, telemetry and mental status throughout from the start of the procedure until the patient was taken to the recovery room. Ultrasound was used to evaluate the left common femoral artery.  It was patent .  A digital ultrasound image was acquired.  A Seldinger needle was used to access the left common femoral artery under direct ultrasound guidance and a permanent image was performed.  A 0.035 J wire was advanced without resistance and a 5Fr sheath was placed.  Pigtail catheter was placed into the aorta and an AP aortogram was performed. This demonstrated normal renal arteries and normal aorta and iliac segments without significant stenosis. I then crossed the aortic bifurcation and advanced to the right femoral head. Selective right lower extremity angiogram was then performed. This demonstrated diffuse disease of the proximal and mid right SFA with resulting greater than 75% stenosis.  The popliteal artery normalized.  There was a typical tibial trifurcation and the anterior tibial artery was large and continuous distally.  The tibioperoneal trunk had a relatively short segment stenosis of 70 to 75%.  The posterior tibial  artery itself was then medium in size and continuous to the foot.  The peroneal artery was small and provided a small amount of blood flow distally. It was felt that it was in the patient's best interest to proceed with intervention after these images to avoid a second procedure and a larger amount of contrast and fluoroscopy based off of the findings from the initial angiogram. The patient was systemically heparinized and a 6 French Ansell sheath was then placed over the [employer] wire. I then used a Kumpe catheter and the advantage wire to cross the SFA disease and  get down into the tibioperoneal trunk was able to cross that stenosis as well without difficulty parking the wire at the ankle.  I then proceeded with treatment.  A 4 mm diameter by 8 cm length Lutonix drug-coated angioplasty balloon was gently inflated to 6 atm for 1 minute in the right tibioperoneal trunk and most distal popliteal artery.  Imaging following angioplasty showed a marked improvement with only about a 20% residual stenosis.  I then turned my attention to the SFA.  A 5 mm diameter by 22 cm length Lutonix drug-coated angioplasty balloon was inflated to 8 atm for 1 minute in the right proximal and mid SFA.  Completion imaging showed greater than 50% residual stenosis with diffuse disease throughout the region so I elected to place a stent.  A 6 mm diameter by 20 cm length life stent was then deployed in the proximal and mid right SFA and postdilated with a 5 mm diameter Lutonix drug-coated balloon with excellent angiographic completion result and less than 10% residual stenosis. I elected to terminate the procedure. The sheath was removed and StarClose closure device was deployed in the left femoral artery with excellent hemostatic result. The patient was taken to the recovery room in stable condition having tolerated the procedure well.  Findings:               Aortogram:  This demonstrated normal renal arteries and normal aorta and iliac segments without significant stenosis.             Right Lower Extremity:  This demonstrated diffuse disease of the proximal and mid right SFA with resulting greater than 75% stenosis.  The popliteal artery normalized.  There was a typical tibial trifurcation and the anterior tibial artery was large and continuous distally.  The tibioperoneal trunk had a relatively short segment stenosis of 70 to 75%.  The posterior tibial artery itself was then medium in size and continuous to the foot.  The peroneal artery was small and provided a small amount of blood flow  distally.   Disposition: Patient was taken to the recovery room in stable condition having tolerated the procedure well.  Complications: None  Selinda Gu 11/01/2023 4:28 PM   This note was created with Dragon Medical transcription system. Any errors in dictation are purely unintentional.

## 2023-11-01 NOTE — Inpatient Diabetes Management (Signed)
 Inpatient Diabetes Program Recommendations  AACE/ADA: New Consensus Statement on Inpatient Glycemic Control (2015)  Target Ranges:  Prepandial:   less than 140 mg/dL      Peak postprandial:   less than 180 mg/dL (1-2 hours)      Critically ill patients:  140 - 180 mg/dL    Latest Reference Range & Units 10/31/23 14:41 10/31/23 18:33 10/31/23 21:35 11/01/23 02:28 11/01/23 09:01  Glucose-Capillary 70 - 99 mg/dL 823 (H) 777 (H) 899 (H) 159 (H) 99  (H): Data is abnormally high   To ED with Cellulitis of right lower extremity    History: Type 1 Diabetes   Home Meds: OmniPod 5 Insulin  Pump (uses HUmalog  U200 Insulin  in the pump)                       Dexcom G6 CGM                       Wegovy 0.5 mg Qweek  Current Orders: Insulin  Pump    Met w/ pt at bedside this AM.  Pt A&O and remains appropriate to use home insulin  pump in hospital.  Pt changed her Insulin  Pod earlier this AM around 3am.  Pt stated she is scheduled to have LE Angiogram later today.      ENDO: Dr. Damian Last Seen 07/03/2023 --Continue insulin  pump --Continue use of the Humalog  U200 in her OmniPod pump --New settings: Basal rate  12am 1.2 units/hr  4 am 1.55 units/hr 24-hr basal = 35 units of U200 insulin  (equals 70 units of U100 insulin )  Bolus Sensitivity 28 Correction Threshold 120 BG target 110 Insulin : Carb ratio 10    --Start Wegovy 0.25 mg weekly--After 4 weeks, will adjust to 0.5 mg weekly    --Will follow patient during hospitalization--  Adina Rudolpho Arrow RN, MSN, CDCES Diabetes Coordinator Inpatient Glycemic Control Team Team Pager: [phone] (8a-5p)

## 2023-11-01 NOTE — Interval H&P Note (Signed)
 History and Physical Interval Note:  11/01/2023 3:15 PM  Emily Collins  has presented today for surgery, with the diagnosis of PAD.  The various methods of treatment have been discussed with the patient and family. After consideration of risks, benefits and other options for treatment, the patient has consented to  Procedure(s): Lower Extremity Angiography (Right) as a surgical intervention.  The patient's history has been reviewed, patient examined, no change in status, stable for surgery.  I have reviewed the patient's chart and labs.  Questions were answered to the patient's satisfaction.     Keiyon Plack

## 2023-11-01 NOTE — Plan of Care (Signed)
  Problem: Coping: Goal: Ability to adjust to condition or change in health will improve Outcome: Progressing   Problem: Fluid Volume: Goal: Ability to maintain a balanced intake and output will improve Outcome: Progressing   Problem: Health Behavior/Discharge Planning: Goal: Ability to manage health-related needs will improve Outcome: Progressing

## 2023-11-01 NOTE — Progress Notes (Signed)
   10/31/23 2109  Vitals  Temp 98.1 F (36.7 C)  BP 136/61  MAP (mmHg) 80  BP Location Left Arm  BP Method Automatic  Patient Position (if appropriate) Lying  Pulse Rate 71  Pulse Rate Source Monitor  Resp 19  MEWS COLOR  MEWS Score Color Green  Oxygen Therapy  SpO2 100 %  O2 Device Room Air  MEWS Score  MEWS Temp 0  MEWS Systolic 0  MEWS Pulse 0  MEWS RR 0  MEWS LOC 0  MEWS Score 0   Pt is a new admit, oriented to room, call light system and belongings policy. Pt alert and oriented, VSS,

## 2023-11-01 NOTE — H&P (View-Only) (Signed)
 Hospital Consult    Reason for Consult:  Right Lower extremity Great toe swelling and pain.  Requesting Physician:  Dr Cort Mana MD  MRN #:  [id number]  History of Present Illness: This is a 58 y.o. female with medical history significant of IDDM, HTN, HLD, CKD stage IIIb, presented with worsening of right big toe swelling rash and pain. Symptoms started 3 weeks ago, patient started develop rash and swelling of right big toe.  She went to see urgent care center and was told  could be infection, could be gout the patient never had history of gout before.  And blood work did show she has slightly elevated uric acid.  Patient was given 1 week course of Keflex  for the visit.  However the symptoms of right big toe swelling rash and pain continues after 7 days of Keflex  and last week patient also developed cracks in the skin on top of the right big toe with some bleeding but no significant past from the bone.  Denied any fever chills and she does not remember any direct injuries to the right big toe.   On exam this morning patient is resting comfortably in bed.  Patient's right lower foot was wrapped and dressed by podiatry late yesterday.  I did not remove the dressings but did review the media files that are in the system.  Patient's lower extremity was warm to touch.  I was unable to palpate pulses to her DP and PT.  No cellulitis to note.  Patient endorses her history of diabetes as possibly being a cause of her right great toe issues.  Patient endorses her hemoglobin A1c is now down to 8.1 which is the best its ever been.  No other complaints overnight.  Vitals all remained stable.  Vascular surgery was consulted to evaluate.  Past Medical History:  Diagnosis Date   Allergy    Anxiety    Arthritis    Asthma    Depression    Diabetes mellitus without complication (HCC)    GERD (gastroesophageal reflux disease)    History of chicken pox 04/07/2015   DID have Chicken Pox.     Hyperlipidemia     Hypertension    Neuromuscular disorder (HCC)    Venous (peripheral) insufficiency     Past Surgical History:  Procedure Laterality Date   APPENDECTOMY  1979   APPLICATION OF WOUND VAC Right 01/07/2021   Procedure: APPLICATION OF WOUND VAC;  Surgeon: Silva Juliene SAUNDERS, DPM;  Location: [facility];  Service: Podiatry;  Laterality: Right;   BILATERAL CARPAL TUNNEL RELEASE Bilateral    Dr. Kathlynn   CESAREAN SECTION  2003   GRAFT APPLICATION Right 01/07/2021   Procedure: GRAFT APPLICATION;  Surgeon: Silva Juliene SAUNDERS, DPM;  Location: [facility];  Service: Podiatry;  Laterality: Right;   INCISION AND DRAINAGE Right 12/01/2020   Procedure: INCISION AND DRAINAGE;  Surgeon: Silva Juliene SAUNDERS, DPM;  Location: [facility];  Service: Podiatry;  Laterality: Right;   INCISION AND DRAINAGE Right 12/07/2020   Procedure: INCISION AND DRAINAGE;  Surgeon: Janit Thresa HERO, DPM;  Location: [facility];  Service: Podiatry;  Laterality: Right;   INCISION AND DRAINAGE OF WOUND Right 01/07/2021   Procedure: IRRIGATION AND DEBRIDEMENT WOUND;  Surgeon: Silva Juliene SAUNDERS, DPM;  Location: [facility];  Service: Podiatry;  Laterality: Right;  regional block if possible   LAPAROSCOPIC LYSIS OF ADHESIONS     SHOULDER SURGERY Right 08/2009   Dr. Kathlynn   TUBAL LIGATION  Allergies  Allergen Reactions   Atorvastatin      Muscle and join pains.   Levaquin   [Levofloxacin  In D5w]     GI upset   Simvastatin     joint aches.   Dilaudid  [Hydromorphone Hcl] Hives, Itching and Rash   Tetracycline Rash    Prior to Admission medications   Medication Sig Start Date End Date Taking? Authorizing Provider  albuterol  (VENTOLIN  HFA) 108 (90 Base) MCG/ACT inhaler Inhale 2 puffs into the lungs every 6 (six) hours as needed for wheezing or shortness of breath. 08/25/22   Ernest Ronal BRAVO, MD  amitriptyline  (ELAVIL ) 25 MG tablet Take 25 mg by mouth at bedtime as needed (neuropathy pain).    [provider]  aspirin  81 MG tablet Take 81 mg by mouth  daily.    [provider]  ATROVENT  HFA 17 MCG/ACT inhaler Inhale 2 puffs into the lungs 4 (four) times daily.    [provider]  cetirizine  (ZYRTEC ) 10 MG tablet TAKE 1 TABLET(10 MG) BY MOUTH DAILY Patient taking differently: Take 10 mg by mouth daily. 05/16/17   Gasper Nancyann BRAVO, MD  Continuous Glucose Sensor (DEXCOM G6 SENSOR) MISC SMARTSIG:Topical Every 10 Days    [provider]  Continuous Glucose Transmitter (DEXCOM G6 TRANSMITTER) MISC USE TO MONITOR BLOOD SUGAR. REPLACE EVERY 3 MONTHS 04/10/23   [provider]  cyclobenzaprine  (FLEXERIL ) 5 MG tablet Take 5 mg by mouth at bedtime as needed for muscle spasms.    [provider]  docusate sodium  (COLACE) 100 MG capsule Take 1 tablet once or twice daily as needed for constipation while taking narcotic pain medicine 10/23/22   Gordan Huxley, MD  esomeprazole  (NEXIUM ) 40 MG capsule Take 40 mg by mouth daily. 07/24/23   [provider]  ezetimibe  (ZETIA ) 10 MG tablet Take 1 tablet (10 mg total) by mouth daily. 07/12/23 10/10/23  Darliss Rogue, MD  famotidine  (PEPCID ) 40 MG tablet Take 40 mg by mouth daily. 10/19/20   [provider]  ferrous sulfate  325 (65 FE) MG tablet Take 325 mg by mouth 2 (two) times daily with a meal.    [provider]  FLUoxetine  (PROZAC ) 20 MG capsule Take 1 capsule (20 mg total) by mouth daily. Patient taking differently: Take 40 mg by mouth daily. 12/10/20   Josette Ade, MD  furosemide  (LASIX ) 20 MG tablet Take 1 tablet (20 mg total) by mouth daily. 07/12/23 10/10/23  Darliss Rogue, MD  gabapentin  (NEURONTIN ) 300 MG capsule Take 2 capsules (600 mg total) by mouth 3 (three) times daily. 01/08/21   Krishnan, Gokul, MD  HUMALOG  KWIKPEN 100 UNIT/ML KwikPen Inject 5 Units into the skin 3 (three) times daily with meals. INJ UP TO 100 UNI [place] PER DAY AS PER MD INSTRUCTIONS 12/09/20   Josette Ade, MD  hydrOXYzine  (ATARAX /VISTARIL ) 10 MG tablet  Take 10 mg by mouth 3 (three) times daily as needed for itching or anxiety.    Kandis Stefano Iles, MD  Insulin  Disposable Pump (OMNIPOD 5 DEXG7G6 PODS GEN 5) MISC SMARTSIG:1 Each SUB-Q Every 3 Days    [provider]  Melatonin 10 MG TABS Take 10 mg by mouth at bedtime as needed (sleep).    [provider]  meloxicam  (MOBIC ) 15 MG tablet Take 15 mg by mouth daily.    [provider]  mupirocin  ointment (BACTROBAN ) 2 % Apply 1 application topically daily. 04/27/21   Silva Juliene SAUNDERS, DPM  omeprazole (PRILOSEC) 40 MG capsule Take 40  mg by mouth 2 (two) times daily. 11/21/20   [provider]    Social History   Socioeconomic History   Marital status: Married    Spouse name: John   Number of children: 3   Years of education: H/S   Highest education level: Not on file  Occupational History   Occupation: Housewife  Tobacco Use   Smoking status: Former    Current packs/day: 0.00    Average packs/day: 1 pack/day for 32.5 years (32.5 ttl pk-yrs)    Types: Cigarettes    Start date: 04/06/1985    Quit date: 09/25/2017    Years since quitting: 6.1   Smokeless tobacco: Never  Vaping Use   Vaping status: Never Used  Substance and Sexual Activity   Alcohol use: Yes    Alcohol/week: 1.0 - 2.0 standard drink of alcohol    Types: 1 - 2 Glasses of wine per week    Comment: Socially   Drug use: No   Sexual activity: Not on file  Other Topics Concern   Not on file  Social History Narrative   Not on file   Social Drivers of Health   Financial Resource Strain: Not on file  Food Insecurity: No Food Insecurity (10/31/2023)   Hunger Vital Sign    Worried About Running Out of Food in the Last Year: Never true    Ran Out of Food in the Last Year: Never true  Transportation Needs: No Transportation Needs (10/31/2023)   PRAPARE - Administrator, Civil Service (Medical): No    Lack of Transportation (Non-Medical): No  Physical Activity: Not on file   Stress: Not on file  Social Connections: Not on file  Intimate Partner Violence: Not At Risk (10/31/2023)   Humiliation, Afraid, Rape, and Kick questionnaire    Fear of Current or Ex-Partner: No    Emotionally Abused: No    Physically Abused: No    Sexually Abused: No     Family History  Problem Relation Age of Onset   Hypertension Mother    Diabetes Mother    Hyperlipidemia Mother    Arthritis Mother    Heart disease Father    Kidney disease Father     ROS: Otherwise negative unless mentioned in HPI  Physical Examination  Vitals:   10/31/23 1900 10/31/23 2109  BP: 122/74 136/61  Pulse: 76 71  Resp: 18 19  Temp:  98.1 F (36.7 C)  SpO2: 97% 100%   Body mass index is 43.55 kg/m.  General:  WDWN in NAD Gait: Not observed HENT: WNL, normocephalic Pulmonary: normal non-labored breathing, without Rales, rhonchi,  wheezing Cardiac: regular, without  Murmurs, rubs or gallops; without carotid bruits Abdomen: Positive bowel sounds, soft, NT/ND, no masses Skin: without rashes Vascular Exam/Pulses: Bilateral lower extremities warm to touch.  Right lower extremity with dressing applied to great toe.  Unable to palpate DP PT pulses bilaterally. Extremities: with ischemic changes, without Gangrene , without cellulitis; without open wounds;  Musculoskeletal: no muscle wasting or atrophy  Neurologic: A&O X 3;  No focal weakness or paresthesias are detected; speech is fluent/normal Psychiatric:  The pt has Normal affect. Lymph:  Unremarkable  CBC    Component Value Date/Time   WBC 9.5 10/31/2023 0847   RBC 3.86 (L) 10/31/2023 0847   HGB 11.0 (L) 10/31/2023 0847   HCT 35.9 (L) 10/31/2023 0847   PLT 283 10/31/2023 0847   MCV 93.0 10/31/2023 0847   MCH 28.5 10/31/2023 0847  MCHC 30.6 10/31/2023 0847   RDW 14.1 10/31/2023 0847   LYMPHSABS 2.3 10/31/2023 0847   MONOABS 0.5 10/31/2023 0847   EOSABS 0.7 (H) 10/31/2023 0847   BASOSABS 0.1 10/31/2023 0847    BMET     Component Value Date/Time   NA 134 (L) 10/31/2023 0847   NA 137 08/29/2016 0915   K 4.5 10/31/2023 0847   CL 104 10/31/2023 0847   CO2 22 10/31/2023 0847   GLUCOSE 181 (H) 10/31/2023 0847   BUN 27 (H) 10/31/2023 0847   BUN 15 08/29/2016 0915   CREATININE 1.48 (H) 11/01/2023 0402   CALCIUM  8.1 (L) 10/31/2023 0847   GFRNONAA 41 (L) 11/01/2023 0402   GFRAA 48 (L) 05/06/2019 0658    COAGS: Lab Results  Component Value Date   INR 1.1 01/02/2021     Non-Invasive Vascular Imaging:   EXAM:10/31/23 RIGHT FOOT COMPLETE - 3+ VIEW   COMPARISON:  November 29, 2020.   FINDINGS: There is lucency seen involving the first distal phalanx concerning for possible osteomyelitis. Joint spaces are intact. Mild posterior calcaneal spurring is noted. Vascular calcifications are noted.   IMPRESSION: Findings concerning for osteomyelitis involving first distal phalanx.  Statin:  No. Beta Blocker:  No. Aspirin :  Yes.   ACEI:  No. ARB:  No. CCB use:  No Other antiplatelets/anticoagulants:  No.    ASSESSMENT/PLAN: This is a 58 y.o. female who presents to [facility] emergency department with worsening of her right great toe pain with swelling and a rash.  Patient endorses symptoms started about 3 weeks ago.  Patient presented to urgent care for which she was treated with a 1 week course of Keflex  during that visit.  However the symptoms continued and developed into cracks in the skin on the top of her toe.  There was some bleeding but nothing significant.  Due to the condition of her great toe vascular surgery plans on taking the patient to the vascular lab today, 11/01/2023 for a right lower extremity angiogram with possible intervention.  I discussed in detail with the patient the procedure, benefits, risk, and complications.  Patient verbalizes her understanding.  She would like to proceed to soon as possible.  I answered all the patient's questions this morning.  Patient will be made n.p.o. after midnight  for the procedure tomorrow.  Patient has been seen by Dr. Marea previously.   -I discussed the plan with Dr. Selinda Marea MD and he agrees with the plan.   Gwendlyn JONELLE [facility]ank [facility] 11/01/2023 7:20 AM

## 2023-11-01 NOTE — Progress Notes (Signed)
 PODIATRY / FOOT AND ANKLE SURGERY PROGRESS NOTE  Requesting Physician: Dr. Laurita  Reason for consult: R toe infection   HPI: Emily Collins is a 58 y.o. female who presents today resting in bed comfortably.  She is scheduled undergo a CT angiogram with vascular surgery today due to concerns for circulation issues.  She has some mild discomfort to her right big toe today but otherwise is doing pretty well.  Patient denies nausea, vomiting, fevers, chills.  PMHx:  Past Medical History:  Diagnosis Date   Allergy    Anxiety    Arthritis    Asthma    Depression    Diabetes mellitus without complication (HCC)    GERD (gastroesophageal reflux disease)    History of chicken pox 04/07/2015   DID have Chicken Pox.     Hyperlipidemia    Hypertension    Neuromuscular disorder (HCC)    Venous (peripheral) insufficiency     Surgical Hx:  Past Surgical History:  Procedure Laterality Date   APPENDECTOMY  1979   APPLICATION OF WOUND VAC Right 01/07/2021   Procedure: APPLICATION OF WOUND VAC;  Surgeon: Silva Juliene SAUNDERS, DPM;  Location: [facility];  Service: Podiatry;  Laterality: Right;   BILATERAL CARPAL TUNNEL RELEASE Bilateral    Dr. Kathlynn   CESAREAN SECTION  2003   GRAFT APPLICATION Right 01/07/2021   Procedure: GRAFT APPLICATION;  Surgeon: Silva Juliene SAUNDERS, DPM;  Location: [facility];  Service: Podiatry;  Laterality: Right;   INCISION AND DRAINAGE Right 12/01/2020   Procedure: INCISION AND DRAINAGE;  Surgeon: Silva Juliene SAUNDERS, DPM;  Location: [facility];  Service: Podiatry;  Laterality: Right;   INCISION AND DRAINAGE Right 12/07/2020   Procedure: INCISION AND DRAINAGE;  Surgeon: Janit Thresa HERO, DPM;  Location: [facility];  Service: Podiatry;  Laterality: Right;   INCISION AND DRAINAGE OF WOUND Right 01/07/2021   Procedure: IRRIGATION AND DEBRIDEMENT WOUND;  Surgeon: Silva Juliene SAUNDERS, DPM;  Location: [facility];  Service: Podiatry;  Laterality: Right;  regional block if possible   LAPAROSCOPIC LYSIS OF  ADHESIONS     SHOULDER SURGERY Right 08/2009   Dr. Kathlynn   TUBAL LIGATION      FHx:  Family History  Problem Relation Age of Onset   Hypertension Mother    Diabetes Mother    Hyperlipidemia Mother    Arthritis Mother    Heart disease Father    Kidney disease Father     Social History:  reports that she quit smoking about 6 years ago. Her smoking use included cigarettes. She started smoking about 38 years ago. She has a 32.5 pack-year smoking history. She has never used smokeless tobacco. She reports current alcohol use of about 1.0 - 2.0 standard drink of alcohol per week. She reports that she does not use drugs.  Allergies:  Allergies  Allergen Reactions   Atorvastatin      Muscle and join pains.   Levaquin   [Levofloxacin  In D5w]     GI upset   Simvastatin     joint aches.   Dilaudid  [Hydromorphone Hcl] Hives, Itching and Rash   Tetracycline Rash    Medications Prior to Admission  Medication Sig Dispense Refill   amitriptyline  (ELAVIL ) 25 MG tablet Take 25 mg by mouth at bedtime as needed (neuropathy pain).     aspirin  81 MG tablet Take 81 mg by mouth daily.     cetirizine  (ZYRTEC ) 10 MG tablet TAKE 1 TABLET(10 MG) BY MOUTH DAILY (Patient taking differently:  Take 10 mg by mouth daily.) 90 tablet 1   esomeprazole  (NEXIUM ) 40 MG capsule Take 40 mg by mouth daily.     ezetimibe  (ZETIA ) 10 MG tablet Take 1 tablet (10 mg total) by mouth daily. 90 tablet 3   FLUoxetine  (PROZAC ) 20 MG capsule Take 1 capsule (20 mg total) by mouth daily. (Patient taking differently: Take 40 mg by mouth daily.)     furosemide  (LASIX ) 20 MG tablet Take 1 tablet (20 mg total) by mouth daily. 90 tablet 3   gabapentin  (NEURONTIN ) 300 MG capsule Take 3 capsules by mouth at bedtime.     HUMALOG  KWIKPEN 200 UNIT/ML KwikPen Inject 80 Units into the skin daily.     meloxicam  (MOBIC ) 15 MG tablet Take 15 mg by mouth daily.     Semaglutide-Weight Management 0.5 MG/0.5ML SOAJ Inject 0.5 mg into the skin once a  week.     albuterol  (VENTOLIN  HFA) 108 (90 Base) MCG/ACT inhaler Inhale 2 puffs into the lungs every 6 (six) hours as needed for wheezing or shortness of breath. (Patient not taking: Reported on 11/01/2023) 8 g 2   Continuous Glucose Sensor (DEXCOM G6 SENSOR) MISC SMARTSIG:Topical Every 10 Days     Continuous Glucose Transmitter (DEXCOM G6 TRANSMITTER) MISC USE TO MONITOR BLOOD SUGAR. REPLACE EVERY 3 MONTHS     Insulin  Disposable Pump (OMNIPOD 5 DEXG7G6 PODS GEN 5) MISC SMARTSIG:1 Each SUB-Q Every 3 Days      Physical Exam: General: Alert and oriented.  No apparent distress.  Vascular: Difficult to palpate DP/PT pulses bilaterally due to moderate swelling.  Lymphedema present to bilateral lower extremities.  Capillary fill time intact to digits.  No hair growth present to bilateral lower extremities.  Neuro: Light touch sensation reduced to bilateral lower extremities.  Derm: Scars present to the anterior medial and anterior lateral aspects of the ankle, well-healed, no openings.  Right hallux dorsal laterally at the IPJ appears to have a small superficial opening with associated erythema and edema surrounding this area that extends to the metatarsal phalangeal joint, the wound is fairly superficial and measures approximately 1 cm x 0.2 cm x dermis, does not appear to probe deep at this time.  Overall edema and erythema appears to be mildly improved today compared to last visit.  Appears to possibly have some degree of fluctuance present over the dorsal lateral aspect of the IPJ hallux.  Right fourth toe with lateral small ulceration at the PIPJ likely related from the fourth and fifth toes rubbing together, appears to be macerated, appears fairly superficial and measures approximately 0.1 x 0.1 x 0.1 cm.  MSK: Dropfoot right side.  Results for orders placed or performed during the hospital encounter of 10/31/23 (from the past 48 hours)  CBC with Differential     Status: Abnormal   Collection  Time: 10/31/23  8:47 AM  Result Value Ref Range   WBC 9.5 4.0 - 10.5 K/uL   RBC 3.86 (L) 3.87 - 5.11 MIL/uL   Hemoglobin 11.0 (L) 12.0 - 15.0 g/dL   HCT 64.0 (L) 63.9 - 53.9 %   MCV 93.0 80.0 - 100.0 fL   MCH 28.5 26.0 - 34.0 pg   MCHC 30.6 30.0 - 36.0 g/dL   RDW 85.8 88.4 - 84.4 %   Platelets 283 150 - 400 K/uL   nRBC 0.0 0.0 - 0.2 %   Neutrophils Relative % 62 %   Neutro Abs 5.9 1.7 - 7.7 K/uL   Lymphocytes Relative 24 %  Lymphs Abs 2.3 0.7 - 4.0 K/uL   Monocytes Relative 5 %   Monocytes Absolute 0.5 0.1 - 1.0 K/uL   Eosinophils Relative 8 %   Eosinophils Absolute 0.7 (H) 0.0 - 0.5 K/uL   Basophils Relative 1 %   Basophils Absolute 0.1 0.0 - 0.1 K/uL   Immature Granulocytes 0 %   Abs Immature Granulocytes 0.03 0.00 - 0.07 K/uL    Comment: Performed at [facility], [address], [place], [place] [address]  Comprehensive metabolic panel     Status: Abnormal   Collection Time: 10/31/23  8:47 AM  Result Value Ref Range   Sodium 134 (L) 135 - 145 mmol/L   Potassium 4.5 3.5 - 5.1 mmol/L   Chloride 104 98 - 111 mmol/L   CO2 22 22 - 32 mmol/L   Glucose, Bld 181 (H) 70 - 99 mg/dL    Comment: Glucose reference range applies only to samples taken after fasting for at least 8 hours.   BUN 27 (H) 6 - 20 mg/dL   Creatinine, Ser 8.52 (H) 0.44 - 1.00 mg/dL   Calcium  8.1 (L) 8.9 - 10.3 mg/dL   Total Protein 7.5 6.5 - 8.1 g/dL   Albumin 3.6 3.5 - 5.0 g/dL   AST 22 15 - 41 U/L   ALT 22 0 - 44 U/L   Alkaline Phosphatase 79 38 - 126 U/L   Total Bilirubin 0.2 0.0 - 1.2 mg/dL   GFR, Estimated 41 (L) >60 mL/min    Comment: (NOTE) Calculated using the CKD-EPI Creatinine Equation (2021)    Anion gap 8 5 - 15    Comment: Performed at [facility], [address] [place], [place] [address]  Uric acid     Status: None   Collection Time: 10/31/23  8:47 AM  Result Value Ref Range   Uric Acid, Serum 6.6 2.5 - 7.1 mg/dL    Comment: Performed at [facility],  [address] [place], [place] [address]  CBG monitoring, ED     Status: Abnormal   Collection Time: 10/31/23  2:41 PM  Result Value Ref Range   Glucose-Capillary 176 (H) 70 - 99 mg/dL    Comment: Glucose reference range applies only to samples taken after fasting for at least 8 hours.  MRSA Next Gen by PCR, Nasal     Status: Abnormal   Collection Time: 10/31/23  2:42 PM   Specimen: Nasal Mucosa; Nasal Swab  Result Value Ref Range   MRSA by PCR Next Gen DETECTED (A) NOT DETECTED    Comment: RESULT CALLED TO, READ BACK BY AND VERIFIED WITH: JESS SAVAGE 10/31/23 1826 KLW (NOTE) The GeneXpert MRSA Assay (FDA approved for NASAL specimens only), is one component of a comprehensive MRSA colonization surveillance program. It is not intended to diagnose MRSA infection nor to guide or monitor treatment for MRSA infections. Test performance is not FDA approved in patients less than 1 years old. Performed at [facility], [address] [place], [place] [address]   HIV Antibody (routine testing w rflx)     Status: None   Collection Time: 10/31/23  2:42 PM  Result Value Ref Range   HIV Screen 4th Generation wRfx Non Reactive Non Reactive    Comment: Performed at [facility], [address] [place], [place] [address]  Hemoglobin A1c     Status: Abnormal   Collection Time: 10/31/23  2:42 PM  Result Value Ref Range   Hgb A1c  MFr Bld 8.2 (H) 4.8 - 5.6 %    Comment: (NOTE) Pre diabetes:          5.7%-6.4%  Diabetes:              >6.4%  Glycemic control for   <7.0% adults with diabetes    Mean Plasma Glucose 188.64 mg/dL    Comment: Performed at [facility], [address], [place], [place] [address]  Culture, blood (Routine X 2) w Reflex to ID Panel     Status: None (Preliminary result)   Collection Time: 10/31/23  2:42 PM   Specimen: BLOOD LEFT ARM  Result Value Ref Range   Specimen Description BLOOD LEFT ARM    Special Requests      BOTTLES DRAWN AEROBIC  AND ANAEROBIC Blood Culture results may not be optimal due to an inadequate volume of blood received in culture bottles   Culture      NO GROWTH < 24 HOURS Performed at [facility], [address], [place], [place] [address]    Report Status PENDING   Culture, blood (Routine X 2) w Reflex to ID Panel     Status: None (Preliminary result)   Collection Time: 10/31/23  2:42 PM   Specimen: BLOOD RIGHT ARM  Result Value Ref Range   Specimen Description BLOOD RIGHT ARM    Special Requests      BOTTLES DRAWN AEROBIC AND ANAEROBIC Blood Culture results may not be optimal due to an inadequate volume of blood received in culture bottles   Culture      NO GROWTH < 24 HOURS Performed at [facility], [address] [place], [place] [address]    Report Status PENDING   Sedimentation rate     Status: Abnormal   Collection Time: 10/31/23  2:42 PM  Result Value Ref Range   Sed Rate 58 (H) 0 - 30 mm/hr    Comment: Performed at [facility], [address] [place], [place] [address]  C-reactive protein     Status: Abnormal   Collection Time: 10/31/23  2:42 PM  Result Value Ref Range   CRP 4.0 (H) <1.0 mg/dL    Comment: Performed at [facility], [address] [place], [place] [address]  Aerobic/Anaerobic Culture w Gram Stain (surgical/deep wound)     Status: None (Preliminary result)   Collection Time: 10/31/23  6:29 PM   Specimen: Toe; Wound  Result Value Ref Range   Specimen Description      TOE Performed at [facility], [address], [place], [place] [address]    Special Requests      RIGHT TOE Performed at [facility], [address] [place], [place] [address]    Gram Stain NO WBC SEEN NO ORGANISMS SEEN     Culture      NO GROWTH < 12 HOURS Performed at [facility], [address] [place], [place] [address]    Report Status PENDING   CBG monitoring, ED     Status: Abnormal   Collection Time: 10/31/23  6:33 PM   Result Value Ref Range   Glucose-Capillary 222 (H) 70 - 99 mg/dL    Comment: Glucose reference range applies only to samples taken after fasting for at least 8 hours.  Glucose, capillary     Status: Abnormal   Collection Time: 10/31/23  9:35 PM  Result Value Ref Range   Glucose-Capillary 100 (H) 70 - 99 mg/dL  Comment: Glucose reference range applies only to samples taken after fasting for at least 8 hours.  Glucose, capillary     Status: Abnormal   Collection Time: 11/01/23  2:28 AM  Result Value Ref Range   Glucose-Capillary 159 (H) 70 - 99 mg/dL    Comment: Glucose reference range applies only to samples taken after fasting for at least 8 hours.  Creatinine, serum     Status: Abnormal   Collection Time: 11/01/23  4:02 AM  Result Value Ref Range   Creatinine, Ser 1.48 (H) 0.44 - 1.00 mg/dL   GFR, Estimated 41 (L) >60 mL/min    Comment: (NOTE) Calculated using the CKD-EPI Creatinine Equation (2021) Performed at [facility], [address] [place], [place] [address]   Glucose, capillary     Status: None   Collection Time: 11/01/23  9:01 AM  Result Value Ref Range   Glucose-Capillary 99 70 - 99 mg/dL    Comment: Glucose reference range applies only to samples taken after fasting for at least 8 hours.   DG Foot Complete Right Result Date: 10/31/2023 CLINICAL DATA:  Right foot pain for 3 weeks without reported injury. EXAM: RIGHT FOOT COMPLETE - 3+ VIEW COMPARISON:  November 29, 2020. FINDINGS: There is lucency seen involving the first distal phalanx concerning for possible osteomyelitis. Joint spaces are intact. Mild posterior calcaneal spurring is noted. Vascular calcifications are noted. IMPRESSION: Findings concerning for osteomyelitis involving first distal phalanx. Electronically Signed   By: Lynwood Landy Raddle M.D.   On: 10/31/2023 12:23    Blood pressure (!) 121/55, pulse 66, temperature 98.1 F (36.7 C), temperature source Oral, resp. rate 17, height 5' 2 (1.575 m),  weight 108 kg, last menstrual period 07/15/2018, SpO2 96%.  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 -Imaging reviewed and discussed with patient in detail.  Appears show early onset osteomyelitis to the distal phalanx laterally near the IPJ hallux -Discussed wounds present to the right foot as well as lymphedema and dropfoot.  Also discussed concern for peripheral vascular disease based on arterial calcification seen on x-ray imaging and unable to feel pulses which could be related more so to lymphedema. -Appreciate vascular recommendations, plan for CT angiogram today to the right lower extremity. -Discussed some of the fluctuance that is palpable today to the toe.  Discussed with patient performing bedside I&D to the area, patient agreeable to this after benefits/complications discussed.  Prepped the area with Betadine over the dorsal lateral aspect of the IPJ hallux, a 15 blade was used to make a small stab incision over the area of supposed fluctuance, the incision was deepened into the subcutaneous tissue, exsanguination was then performed to the area but there mostly appeared to be serosanguineous drainage/blood, no pus or signs of infection evident to the area other than the surrounding erythema and edema in the skin.  Hemostasis was well achieved with compression. -Discussed concern for osteomyelitis to the right great toe.  Discussed treatment options for this issue.  Discussed conservative management with antibiotic therapy versus surgical management which would consist of partial amputation of the toe.  Believe that the toe amputation would be definitive to remove infection.  Patient has elected for conservative care today.  Discussed with patient that we can try this but if it worsens or remains  unimproved would recommend partial amputation of the  toe in the future. -Wound culture to date with no growth. -Appreciate medicine recommendations for antibiotic therapy.  Consult placed for infectious disease.  May require long-term IV antibiotics.  Discussed with Dr. Searcy.  Could consider bone biopsy in the future if ID would like in the next few days.  Discussed that they would like this could perform this tomorrow at some point.  Would plan on removing a portion of the distal phalanx for bone culture and biopsy and also placed some antibiotic beads to the area at the time of the procedure.    Prentice Lee, DPM 11/01/2023, 11:01 AM

## 2023-11-01 NOTE — Consult Note (Signed)
 Hospital Consult    Reason for Consult:  Right Lower extremity Great toe swelling and pain.  Requesting Physician:  Dr Cort Mana MD  MRN #:  [id number]  History of Present Illness: This is a 58 y.o. female with medical history significant of IDDM, HTN, HLD, CKD stage IIIb, presented with worsening of right big toe swelling rash and pain. Symptoms started 3 weeks ago, patient started develop rash and swelling of right big toe.  She went to see urgent care center and was told  could be infection, could be gout the patient never had history of gout before.  And blood work did show she has slightly elevated uric acid.  Patient was given 1 week course of Keflex  for the visit.  However the symptoms of right big toe swelling rash and pain continues after 7 days of Keflex  and last week patient also developed cracks in the skin on top of the right big toe with some bleeding but no significant past from the bone.  Denied any fever chills and she does not remember any direct injuries to the right big toe.   On exam this morning patient is resting comfortably in bed.  Patient's right lower foot was wrapped and dressed by podiatry late yesterday.  I did not remove the dressings but did review the media files that are in the system.  Patient's lower extremity was warm to touch.  I was unable to palpate pulses to her DP and PT.  No cellulitis to note.  Patient endorses her history of diabetes as possibly being a cause of her right great toe issues.  Patient endorses her hemoglobin A1c is now down to 8.1 which is the best its ever been.  No other complaints overnight.  Vitals all remained stable.  Vascular surgery was consulted to evaluate.  Past Medical History:  Diagnosis Date   Allergy    Anxiety    Arthritis    Asthma    Depression    Diabetes mellitus without complication (HCC)    GERD (gastroesophageal reflux disease)    History of chicken pox 04/07/2015   DID have Chicken Pox.     Hyperlipidemia     Hypertension    Neuromuscular disorder (HCC)    Venous (peripheral) insufficiency     Past Surgical History:  Procedure Laterality Date   APPENDECTOMY  1979   APPLICATION OF WOUND VAC Right 01/07/2021   Procedure: APPLICATION OF WOUND VAC;  Surgeon: Silva Juliene SAUNDERS, DPM;  Location: [facility];  Service: Podiatry;  Laterality: Right;   BILATERAL CARPAL TUNNEL RELEASE Bilateral    Dr. Kathlynn   CESAREAN SECTION  2003   GRAFT APPLICATION Right 01/07/2021   Procedure: GRAFT APPLICATION;  Surgeon: Silva Juliene SAUNDERS, DPM;  Location: [facility];  Service: Podiatry;  Laterality: Right;   INCISION AND DRAINAGE Right 12/01/2020   Procedure: INCISION AND DRAINAGE;  Surgeon: Silva Juliene SAUNDERS, DPM;  Location: [facility];  Service: Podiatry;  Laterality: Right;   INCISION AND DRAINAGE Right 12/07/2020   Procedure: INCISION AND DRAINAGE;  Surgeon: Janit Thresa HERO, DPM;  Location: [facility];  Service: Podiatry;  Laterality: Right;   INCISION AND DRAINAGE OF WOUND Right 01/07/2021   Procedure: IRRIGATION AND DEBRIDEMENT WOUND;  Surgeon: Silva Juliene SAUNDERS, DPM;  Location: [facility];  Service: Podiatry;  Laterality: Right;  regional block if possible   LAPAROSCOPIC LYSIS OF ADHESIONS     SHOULDER SURGERY Right 08/2009   Dr. Kathlynn   TUBAL LIGATION  Allergies  Allergen Reactions   Atorvastatin      Muscle and join pains.   Levaquin   [Levofloxacin  In D5w]     GI upset   Simvastatin     joint aches.   Dilaudid  [Hydromorphone Hcl] Hives, Itching and Rash   Tetracycline Rash    Prior to Admission medications   Medication Sig Start Date End Date Taking? Authorizing Provider  albuterol  (VENTOLIN  HFA) 108 (90 Base) MCG/ACT inhaler Inhale 2 puffs into the lungs every 6 (six) hours as needed for wheezing or shortness of breath. 08/25/22   Ernest Ronal BRAVO, MD  amitriptyline  (ELAVIL ) 25 MG tablet Take 25 mg by mouth at bedtime as needed (neuropathy pain).    [provider]  aspirin  81 MG tablet Take 81 mg by mouth  daily.    [provider]  ATROVENT  HFA 17 MCG/ACT inhaler Inhale 2 puffs into the lungs 4 (four) times daily.    [provider]  cetirizine  (ZYRTEC ) 10 MG tablet TAKE 1 TABLET(10 MG) BY MOUTH DAILY Patient taking differently: Take 10 mg by mouth daily. 05/16/17   Gasper Nancyann BRAVO, MD  Continuous Glucose Sensor (DEXCOM G6 SENSOR) MISC SMARTSIG:Topical Every 10 Days    [provider]  Continuous Glucose Transmitter (DEXCOM G6 TRANSMITTER) MISC USE TO MONITOR BLOOD SUGAR. REPLACE EVERY 3 MONTHS 04/10/23   [provider]  cyclobenzaprine  (FLEXERIL ) 5 MG tablet Take 5 mg by mouth at bedtime as needed for muscle spasms.    [provider]  docusate sodium  (COLACE) 100 MG capsule Take 1 tablet once or twice daily as needed for constipation while taking narcotic pain medicine 10/23/22   Gordan Huxley, MD  esomeprazole  (NEXIUM ) 40 MG capsule Take 40 mg by mouth daily. 07/24/23   [provider]  ezetimibe  (ZETIA ) 10 MG tablet Take 1 tablet (10 mg total) by mouth daily. 07/12/23 10/10/23  Darliss Rogue, MD  famotidine  (PEPCID ) 40 MG tablet Take 40 mg by mouth daily. 10/19/20   [provider]  ferrous sulfate  325 (65 FE) MG tablet Take 325 mg by mouth 2 (two) times daily with a meal.    [provider]  FLUoxetine  (PROZAC ) 20 MG capsule Take 1 capsule (20 mg total) by mouth daily. Patient taking differently: Take 40 mg by mouth daily. 12/10/20   Josette Ade, MD  furosemide  (LASIX ) 20 MG tablet Take 1 tablet (20 mg total) by mouth daily. 07/12/23 10/10/23  Darliss Rogue, MD  gabapentin  (NEURONTIN ) 300 MG capsule Take 2 capsules (600 mg total) by mouth 3 (three) times daily. 01/08/21   Krishnan, Gokul, MD  HUMALOG  KWIKPEN 100 UNIT/ML KwikPen Inject 5 Units into the skin 3 (three) times daily with meals. INJ UP TO 100 UNI [place] PER DAY AS PER MD INSTRUCTIONS 12/09/20   Josette Ade, MD  hydrOXYzine  (ATARAX /VISTARIL ) 10 MG tablet  Take 10 mg by mouth 3 (three) times daily as needed for itching or anxiety.    Kandis Stefano Iles, MD  Insulin  Disposable Pump (OMNIPOD 5 DEXG7G6 PODS GEN 5) MISC SMARTSIG:1 Each SUB-Q Every 3 Days    [provider]  Melatonin 10 MG TABS Take 10 mg by mouth at bedtime as needed (sleep).    [provider]  meloxicam  (MOBIC ) 15 MG tablet Take 15 mg by mouth daily.    [provider]  mupirocin  ointment (BACTROBAN ) 2 % Apply 1 application topically daily. 04/27/21   Silva Juliene SAUNDERS, DPM  omeprazole (PRILOSEC) 40 MG capsule Take 40  mg by mouth 2 (two) times daily. 11/21/20   [provider]    Social History   Socioeconomic History   Marital status: Married    Spouse name: John   Number of children: 3   Years of education: H/S   Highest education level: Not on file  Occupational History   Occupation: Housewife  Tobacco Use   Smoking status: Former    Current packs/day: 0.00    Average packs/day: 1 pack/day for 32.5 years (32.5 ttl pk-yrs)    Types: Cigarettes    Start date: 04/06/1985    Quit date: 09/25/2017    Years since quitting: 6.1   Smokeless tobacco: Never  Vaping Use   Vaping status: Never Used  Substance and Sexual Activity   Alcohol use: Yes    Alcohol/week: 1.0 - 2.0 standard drink of alcohol    Types: 1 - 2 Glasses of wine per week    Comment: Socially   Drug use: No   Sexual activity: Not on file  Other Topics Concern   Not on file  Social History Narrative   Not on file   Social Drivers of Health   Financial Resource Strain: Not on file  Food Insecurity: No Food Insecurity (10/31/2023)   Hunger Vital Sign    Worried About Running Out of Food in the Last Year: Never true    Ran Out of Food in the Last Year: Never true  Transportation Needs: No Transportation Needs (10/31/2023)   PRAPARE - Administrator, Civil Service (Medical): No    Lack of Transportation (Non-Medical): No  Physical Activity: Not on file   Stress: Not on file  Social Connections: Not on file  Intimate Partner Violence: Not At Risk (10/31/2023)   Humiliation, Afraid, Rape, and Kick questionnaire    Fear of Current or Ex-Partner: No    Emotionally Abused: No    Physically Abused: No    Sexually Abused: No     Family History  Problem Relation Age of Onset   Hypertension Mother    Diabetes Mother    Hyperlipidemia Mother    Arthritis Mother    Heart disease Father    Kidney disease Father     ROS: Otherwise negative unless mentioned in HPI  Physical Examination  Vitals:   10/31/23 1900 10/31/23 2109  BP: 122/74 136/61  Pulse: 76 71  Resp: 18 19  Temp:  98.1 F (36.7 C)  SpO2: 97% 100%   Body mass index is 43.55 kg/m.  General:  WDWN in NAD Gait: Not observed HENT: WNL, normocephalic Pulmonary: normal non-labored breathing, without Rales, rhonchi,  wheezing Cardiac: regular, without  Murmurs, rubs or gallops; without carotid bruits Abdomen: Positive bowel sounds, soft, NT/ND, no masses Skin: without rashes Vascular Exam/Pulses: Bilateral lower extremities warm to touch.  Right lower extremity with dressing applied to great toe.  Unable to palpate DP PT pulses bilaterally. Extremities: with ischemic changes, without Gangrene , without cellulitis; without open wounds;  Musculoskeletal: no muscle wasting or atrophy  Neurologic: A&O X 3;  No focal weakness or paresthesias are detected; speech is fluent/normal Psychiatric:  The pt has Normal affect. Lymph:  Unremarkable  CBC    Component Value Date/Time   WBC 9.5 10/31/2023 0847   RBC 3.86 (L) 10/31/2023 0847   HGB 11.0 (L) 10/31/2023 0847   HCT 35.9 (L) 10/31/2023 0847   PLT 283 10/31/2023 0847   MCV 93.0 10/31/2023 0847   MCH 28.5 10/31/2023 0847  MCHC 30.6 10/31/2023 0847   RDW 14.1 10/31/2023 0847   LYMPHSABS 2.3 10/31/2023 0847   MONOABS 0.5 10/31/2023 0847   EOSABS 0.7 (H) 10/31/2023 0847   BASOSABS 0.1 10/31/2023 0847    BMET     Component Value Date/Time   NA 134 (L) 10/31/2023 0847   NA 137 08/29/2016 0915   K 4.5 10/31/2023 0847   CL 104 10/31/2023 0847   CO2 22 10/31/2023 0847   GLUCOSE 181 (H) 10/31/2023 0847   BUN 27 (H) 10/31/2023 0847   BUN 15 08/29/2016 0915   CREATININE 1.48 (H) 11/01/2023 0402   CALCIUM  8.1 (L) 10/31/2023 0847   GFRNONAA 41 (L) 11/01/2023 0402   GFRAA 48 (L) 05/06/2019 0658    COAGS: Lab Results  Component Value Date   INR 1.1 01/02/2021     Non-Invasive Vascular Imaging:   EXAM:10/31/23 RIGHT FOOT COMPLETE - 3+ VIEW   COMPARISON:  November 29, 2020.   FINDINGS: There is lucency seen involving the first distal phalanx concerning for possible osteomyelitis. Joint spaces are intact. Mild posterior calcaneal spurring is noted. Vascular calcifications are noted.   IMPRESSION: Findings concerning for osteomyelitis involving first distal phalanx.  Statin:  No. Beta Blocker:  No. Aspirin :  Yes.   ACEI:  No. ARB:  No. CCB use:  No Other antiplatelets/anticoagulants:  No.    ASSESSMENT/PLAN: This is a 58 y.o. female who presents to [facility] emergency department with worsening of her right great toe pain with swelling and a rash.  Patient endorses symptoms started about 3 weeks ago.  Patient presented to urgent care for which she was treated with a 1 week course of Keflex  during that visit.  However the symptoms continued and developed into cracks in the skin on the top of her toe.  There was some bleeding but nothing significant.  Due to the condition of her great toe vascular surgery plans on taking the patient to the vascular lab today, 11/01/2023 for a right lower extremity angiogram with possible intervention.  I discussed in detail with the patient the procedure, benefits, risk, and complications.  Patient verbalizes her understanding.  She would like to proceed to soon as possible.  I answered all the patient's questions this morning.  Patient will be made n.p.o. after midnight  for the procedure tomorrow.  Patient has been seen by Dr. Marea previously.   -I discussed the plan with Dr. Selinda Marea MD and he agrees with the plan.   Gwendlyn JONELLE [facility]ank [facility] 11/01/2023 7:20 AM

## 2023-11-01 NOTE — Plan of Care (Signed)

## 2023-11-01 NOTE — Progress Notes (Signed)
 PROGRESS NOTE    Emily Collins  FMW:[id number] DOB: 1965/12/07 DOA: 10/31/2023 PCP: Kandis Stefano Iles, MD    Brief Narrative:   58 y.o. female with medical history significant of IDDM, HTN, HLD, CKD stage IIIb, presented with worsening of right big toe swelling rash and pain.   Symptoms started 3 weeks ago, patient started develop rash and swelling of right big toe.  She went to see urgent care center and was told  could be infection, could be gout the patient never had history of gout before.  And blood work did show she has slightly elevated uric acid.  Patient was given 1 week course of Keflex  for the visit.  However the symptoms of right big toe swelling rash and pain continues after 7 days of Keflex  and last week patient also developed cracks in the skin on top of the right big toe with some bleeding but no significant past from the bone.  Denied any fever chills and she does not remember any direct injuries to the right big toe.  Status post bedside debridement by podiatry on 2/5.  Tolerated well.  Scheduled for vascular angiography with vascular surgery on 2/6.  Infectious disease consulted as well.  Assessment & Plan:   Principal Problem:   Toe osteomyelitis, right (HCC) Active Problems:   Osteomyelitis (HCC)  Right hallux osteomyelitis and cellulitis Status post bedside debridement with podiatry 2/5 5.  Tolerated procedure well.  Pain mild to moderate. Plan: Podiatry follow-up ID follow-up N.p.o. for vascular intervention 2/6 Continue broad-spectrum IV antibiotics Follow blood cultures, no growth to date Ensure pain control  IDDM On insulin  pump.  Appropriate to continue in hospital.  Carb modified diet when advanced.  CBGs before meals and at bedtime   HTN Chronic lymphedema -On chronic Lasix , BP controlled.   CKD stage IIIb -Euvolemic, creatinine level stable -Continue Lasix    GERD -Continue PPI and Carafate   DVT prophylaxis: SQ Lovenox  Code Status:  Full Family Communication: None Disposition Plan: Status is: Inpatient Remains inpatient appropriate because: Right hallux osteomyelitis   Level of care: Med-Surg  Consultants:  Podiatry Vascular surgery Infectious disease  Procedures:  Bedside debridement  Antimicrobials: Vancomycin  Cefepime   Subjective: Seen and examined.  Appears comfortable.  Resting in bed.  No visible distress.  Pain mild to moderate  Objective: Vitals:   10/31/23 1900 10/31/23 2109 11/01/23 0824 11/01/23 0908  BP: 122/74 136/61  (!) 121/55  Pulse: 76 71  66  Resp: 18 19  17   Temp:  98.1 F (36.7 C)  98.1 F (36.7 C)  TempSrc:    Oral  SpO2: 97% 100% 100% 96%  Weight:      Height:        Intake/Output Summary (Last 24 hours) at 11/01/2023 1224 Last data filed at 11/01/2023 0250 Gross per 24 hour  Intake 700 ml  Output --  Net 700 ml   Filed Weights   10/31/23 0838  Weight: 108 kg    Examination:  General exam: Appears calm and comfortable  Respiratory system: Clear to auscultation. Respiratory effort normal. Cardiovascular system: S1-S2, RRR, no murmurs, no pedal edema Gastrointestinal system: Soft, NT/ND, normal bowel sounds Central nervous system: Alert and oriented. No focal neurological deficits. Extremities: Symmetric 5 x 5 power. Skin: Right hallux and foot in surgical dressings.  Not removed.  Dressing CDI Psychiatry: Judgement and insight appear normal. Mood & affect appropriate.     Data Reviewed: I have personally reviewed following labs and imaging  studies  CBC: Recent Labs  Lab 10/29/23 1550 10/31/23 0847  WBC 13.5* 9.5  NEUTROABS 10.6* 5.9  HGB 11.2* 11.0*  HCT 36.1 35.9*  MCV 90.9 93.0  PLT 280 283   Basic Metabolic Panel: Recent Labs  Lab 10/29/23 1550 10/31/23 0847 11/01/23 0402  NA 130* 134*  --   K 5.2* 4.5  --   CL 99 104  --   CO2 21* 22  --   GLUCOSE 444* 181*  --   BUN 24* 27*  --   CREATININE 1.44* 1.47* 1.48*  CALCIUM  8.3* 8.1*  --     GFR: Estimated Creatinine Clearance: 47.9 mL/min (A) (by C-G formula based on SCr of 1.48 mg/dL (H)). Liver Function Tests: Recent Labs  Lab 10/29/23 1550 10/31/23 0847  AST 25 22  ALT 24 22  ALKPHOS 78 79  BILITOT 0.3 0.2  PROT 7.3 7.5  ALBUMIN 3.5 3.6   No results for input(s): LIPASE, AMYLASE in the last 168 hours. No results for input(s): AMMONIA in the last 168 hours. Coagulation Profile: No results for input(s): INR, PROTIME in the last 168 hours. Cardiac Enzymes: No results for input(s): CKTOTAL, CKMB, CKMBINDEX, TROPONINI in the last 168 hours. BNP (last 3 results) No results for input(s): PROBNP in the last 8760 hours. HbA1C: Recent Labs    10/31/23 1442  HGBA1C 8.2*   CBG: Recent Labs  Lab 10/31/23 1833 10/31/23 2135 11/01/23 0228 11/01/23 0901 11/01/23 1131  GLUCAP 222* 100* 159* 99 107*   Lipid Profile: No results for input(s): CHOL, HDL, LDLCALC, TRIG, CHOLHDL, LDLDIRECT in the last 72 hours. Thyroid  Function Tests: No results for input(s): TSH, T4TOTAL, FREET4, T3FREE, THYROIDAB in the last 72 hours. Anemia Panel: No results for input(s): VITAMINB12, FOLATE, FERRITIN, TIBC, IRON , RETICCTPCT in the last 72 hours. Sepsis Labs: Recent Labs  Lab 10/29/23 1550  LATICACIDVEN 1.5    Recent Results (from the past 240 hours)  MRSA Next Gen by PCR, Nasal     Status: Abnormal   Collection Time: 10/31/23  2:42 PM   Specimen: Nasal Mucosa; Nasal Swab  Result Value Ref Range Status   MRSA by PCR Next Gen DETECTED (A) NOT DETECTED Final    Comment: RESULT CALLED TO, READ BACK BY AND VERIFIED WITH: JESS SAVAGE 10/31/23 1826 KLW (NOTE) The GeneXpert MRSA Assay (FDA approved for NASAL specimens only), is one component of a comprehensive MRSA colonization surveillance program. It is not intended to diagnose MRSA infection nor to guide or monitor treatment for MRSA infections. Test performance is not FDA  approved in patients less than 39 years old. Performed at [facility], [address] [place], [place] [address]   Culture, blood (Routine X 2) w Reflex to ID Panel     Status: None (Preliminary result)   Collection Time: 10/31/23  2:42 PM   Specimen: BLOOD LEFT ARM  Result Value Ref Range Status   Specimen Description BLOOD LEFT ARM  Final   Special Requests   Final    BOTTLES DRAWN AEROBIC AND ANAEROBIC Blood Culture results may not be optimal due to an inadequate volume of blood received in culture bottles   Culture   Final    NO GROWTH < 24 HOURS Performed at [facility], [address], [place], [place] [address]    Report Status PENDING  Incomplete  Culture, blood (Routine X 2) w Reflex to ID Panel     Status: None (Preliminary result)   Collection Time:  10/31/23  2:42 PM   Specimen: BLOOD RIGHT ARM  Result Value Ref Range Status   Specimen Description BLOOD RIGHT ARM  Final   Special Requests   Final    BOTTLES DRAWN AEROBIC AND ANAEROBIC Blood Culture results may not be optimal due to an inadequate volume of blood received in culture bottles   Culture   Final    NO GROWTH < 24 HOURS Performed at [facility], [address] [place], [place] [address]    Report Status PENDING  Incomplete  Aerobic/Anaerobic Culture w Gram Stain (surgical/deep wound)     Status: None (Preliminary result)   Collection Time: 10/31/23  6:29 PM   Specimen: Toe; Wound  Result Value Ref Range Status   Specimen Description   Final    TOE Performed at [facility], [address], [place], [place] [address]    Special Requests   Final    RIGHT TOE Performed at [facility], [address] [place], [place] [address]    Gram Stain NO WBC SEEN NO ORGANISMS SEEN   Final   Culture   Final    NO GROWTH < 12 HOURS Performed at [facility], [address], [place], [place] [address]    Report Status PENDING  Incomplete          Radiology Studies: DG Foot Complete Right Result Date: 10/31/2023 CLINICAL DATA:  Right foot pain for 3 weeks without reported injury. EXAM: RIGHT FOOT COMPLETE - 3+ VIEW COMPARISON:  November 29, 2020. FINDINGS: There is lucency seen involving the first distal phalanx concerning for possible osteomyelitis. Joint spaces are intact. Mild posterior calcaneal spurring is noted. Vascular calcifications are noted. IMPRESSION: Findings concerning for osteomyelitis involving first distal phalanx. Electronically Signed   By: Lynwood Landy Raddle M.D.   On: 10/31/2023 12:23        Scheduled Meds:  aspirin  EC  81 mg Oral Daily   enoxaparin  (LOVENOX ) injection  50 mg Subcutaneous Q24H   ezetimibe   10 mg Oral Daily   famotidine   40 mg Oral Daily   FLUoxetine   40 mg Oral Daily   furosemide   20 mg Oral Daily   gabapentin   600 mg Oral TID   insulin  pump   Subcutaneous TID WC, HS, 0200   ipratropium  0.5 mg Nebulization TID   loratadine   10 mg Oral Daily   pantoprazole   40 mg Oral Daily   Continuous Infusions:  piperacillin -tazobactam (ZOSYN )  IV 3.375 g (11/01/23 0545)   vancomycin        LOS: 1 day       Calvin KATHEE Robson, MD [facility]   If 7PM-7AM, please contact night-coverage  11/01/2023, 12:24 PM

## 2023-11-01 NOTE — Plan of Care (Signed)
   Problem: Coping: Goal: Ability to adjust to condition or change in health will improve Outcome: Progressing   Problem: Fluid Volume: Goal: Ability to maintain a balanced intake and output will improve Outcome: Progressing

## 2023-11-02 ENCOUNTER — Other Ambulatory Visit: Payer: Self-pay | Admitting: Infectious Diseases

## 2023-11-02 ENCOUNTER — Other Ambulatory Visit: Payer: Self-pay

## 2023-11-02 ENCOUNTER — Encounter: Payer: Self-pay | Admitting: Vascular Surgery

## 2023-11-02 ENCOUNTER — Telehealth (INDEPENDENT_AMBULATORY_CARE_PROVIDER_SITE_OTHER): Payer: Self-pay

## 2023-11-02 ENCOUNTER — Inpatient Hospital Stay: Payer: Medicaid Other

## 2023-11-02 DIAGNOSIS — E11628 Type 2 diabetes mellitus with other skin complications: Secondary | ICD-10-CM | POA: Diagnosis not present

## 2023-11-02 DIAGNOSIS — L089 Local infection of the skin and subcutaneous tissue, unspecified: Secondary | ICD-10-CM | POA: Diagnosis not present

## 2023-11-02 DIAGNOSIS — M869 Osteomyelitis, unspecified: Secondary | ICD-10-CM | POA: Diagnosis not present

## 2023-11-02 DIAGNOSIS — M86171 Other acute osteomyelitis, right ankle and foot: Secondary | ICD-10-CM | POA: Diagnosis not present

## 2023-11-02 DIAGNOSIS — Z794 Long term (current) use of insulin: Secondary | ICD-10-CM | POA: Diagnosis not present

## 2023-11-02 LAB — GLUCOSE, CAPILLARY
Glucose-Capillary: 19 mg/dL — CL (ref 70–99)
Glucose-Capillary: 198 mg/dL — ABNORMAL HIGH (ref 70–99)
Glucose-Capillary: 28 mg/dL — CL (ref 70–99)
Glucose-Capillary: 89 mg/dL (ref 70–99)
Glucose-Capillary: 378 mg/dL — ABNORMAL HIGH (ref 70–99)
Glucose-Capillary: 320 mg/dL — ABNORMAL HIGH (ref 70–99)

## 2023-11-02 LAB — CBC WITH DIFFERENTIAL/PLATELET
Abs Immature Granulocytes: 0.04 10*3/uL (ref 0.00–0.07)
Basophils Absolute: 0.1 10*3/uL (ref 0.0–0.1)
Basophils Relative: 1 %
Eosinophils Absolute: 0.4 10*3/uL (ref 0.0–0.5)
Eosinophils Relative: 4 %
HCT: 36.2 % (ref 36.0–46.0)
Hemoglobin: 11 g/dL — ABNORMAL LOW (ref 12.0–15.0)
Immature Granulocytes: 0 %
Lymphocytes Relative: 13 %
Lymphs Abs: 1.5 10*3/uL (ref 0.7–4.0)
MCH: 28.4 pg (ref 26.0–34.0)
MCHC: 30.4 g/dL (ref 30.0–36.0)
MCV: 93.3 fL (ref 80.0–100.0)
Monocytes Absolute: 0.4 10*3/uL (ref 0.1–1.0)
Monocytes Relative: 4 %
Neutro Abs: 8.6 10*3/uL — ABNORMAL HIGH (ref 1.7–7.7)
Neutrophils Relative %: 78 %
Platelets: 285 10*3/uL (ref 150–400)
RBC: 3.88 MIL/uL (ref 3.87–5.11)
RDW: 14.2 % (ref 11.5–15.5)
WBC: 11 10*3/uL — ABNORMAL HIGH (ref 4.0–10.5)
nRBC: 0 % (ref 0.0–0.2)

## 2023-11-02 LAB — BLOOD CULTURE ID PANEL (REFLEXED) - BCID2

## 2023-11-02 LAB — BASIC METABOLIC PANEL
Anion gap: 11 (ref 5–15)
BUN: 30 mg/dL — ABNORMAL HIGH (ref 6–20)
CO2: 21 mmol/L — ABNORMAL LOW (ref 22–32)
Calcium: 7.9 mg/dL — ABNORMAL LOW (ref 8.9–10.3)
Chloride: 105 mmol/L (ref 98–111)
Creatinine, Ser: 1.8 mg/dL — ABNORMAL HIGH (ref 0.44–1.00)
GFR, Estimated: 32 mL/min — ABNORMAL LOW (ref >60)
Glucose, Bld: 154 mg/dL — ABNORMAL HIGH (ref 70–99)
Potassium: 5.2 mmol/L — ABNORMAL HIGH (ref 3.5–5.1)
Sodium: 137 mmol/L (ref 135–145)

## 2023-11-02 LAB — GLUCOSE, RANDOM: Glucose, Bld: 119 mg/dL — ABNORMAL HIGH (ref 70–99)

## 2023-11-02 MED ORDER — AMOXICILLIN-POT CLAVULANATE 875-125 MG PO TABS
1.0000 | ORAL_TABLET | Freq: Two times a day (BID) | ORAL | Status: DC
  Administered 2023-11-02: 1 via ORAL
  Filled 2023-11-02: qty 1

## 2023-11-02 MED ORDER — SENNOSIDES-DOCUSATE SODIUM 8.6-50 MG PO TABS
1.0000 | ORAL_TABLET | Freq: Every day | ORAL | 0 refills | Status: DC
  Filled 2023-11-02: qty 30, 30d supply, fill #0

## 2023-11-02 MED ORDER — CIPROFLOXACIN HCL 500 MG PO TABS
500.0000 mg | ORAL_TABLET | Freq: Two times a day (BID) | ORAL | 0 refills | Status: DC
  Filled 2023-11-02: qty 28, 14d supply, fill #0

## 2023-11-02 MED ORDER — OXYCODONE HCL 5 MG PO TABS
5.0000 mg | ORAL_TABLET | ORAL | 0 refills | Status: DC | PRN
  Filled 2023-11-02: qty 30, 5d supply, fill #0

## 2023-11-02 MED ORDER — DEXTROSE 50 % IV SOLN
INTRAVENOUS | Status: AC
  Administered 2023-11-02: 50 mL
  Filled 2023-11-02: qty 50

## 2023-11-02 MED ORDER — AMOXICILLIN-POT CLAVULANATE 875-125 MG PO TABS
1.0000 | ORAL_TABLET | Freq: Two times a day (BID) | ORAL | 0 refills | Status: DC
  Filled 2023-11-02: qty 28, 14d supply, fill #0

## 2023-11-02 MED ORDER — CIPROFLOXACIN HCL 500 MG PO TABS
500.0000 mg | ORAL_TABLET | Freq: Two times a day (BID) | ORAL | Status: DC
  Administered 2023-11-02: 500 mg via ORAL
  Filled 2023-11-02 (×2): qty 1

## 2023-11-02 MED ORDER — ROSUVASTATIN CALCIUM 10 MG PO TABS
10.0000 mg | ORAL_TABLET | Freq: Every day | ORAL | 1 refills | Status: DC
  Filled 2023-11-02: qty 30, 30d supply, fill #0

## 2023-11-02 MED ORDER — DEXTROSE 50 % IV SOLN: 25.0000 g | INTRAVENOUS | Status: AC

## 2023-11-02 MED ORDER — CLOPIDOGREL BISULFATE 75 MG PO TABS
75.0000 mg | ORAL_TABLET | Freq: Every day | ORAL | 1 refills | Status: DC
  Filled 2023-11-02: qty 30, 30d supply, fill #0

## 2023-11-02 NOTE — Discharge Instructions (Signed)
 Recommend every other day dressing changes to the right foot consisting of bacitracin to the dorsal toe wound area, Betadine gauze between the fourth and fifth toes, dry gauze between all the other toes, Kerlix, Ace wrap with moderate compression that goes from the forefoot to below knee for lymphedema swelling.  Ambulate in postop shoe.

## 2023-11-02 NOTE — Inpatient Diabetes Management (Signed)
 Met w/ pt at beside this AM.  Reviewed CBGs overnight and Hypoglycemia this AM.  Pt told me she thinks she gave herself too high of a bolus last PM when her CBG was 411.  Her Dexcom CGM was removed for her procedure yest and she was unable to see that her glucose levels were trending down.  She has replaced her CGM this AM and told me her CGM readings are coming down since this AM when CBG was 378.  Bolused self with about 11 units this AM for Bfast.  Pt stated she feels OK and has orders to d/c home today.    --Will follow patient during hospitalization--  Adina Rudolpho Arrow RN, MSN, CDCES Diabetes Coordinator Inpatient Glycemic Control Team Team Pager: [phone] (8a-5p)

## 2023-11-02 NOTE — Progress Notes (Signed)
 Hypoglycemic Event  CBG: 28  Treatment: D50 50 mL (25 gm)  Symptoms: Pale, Sweaty, and lethargic  Follow-up CBG: Time:0330 CBG Result:198  Possible Reasons for Event: Inadequate meal intake and Other: self usage of insulin  pump  Comments/MD notified:Brenda Morrison,NP    Axel CHRISTELLA Silversmith

## 2023-11-02 NOTE — Telephone Encounter (Signed)
 Received call from ortho to call patient to schedule 6 week fu with FB. Called patient who said she would call me back since she was currently getting discharged from hospital.   Schedule an appointment with Brown, Fallon E, NP (Vascular Surgery) in 6 weeks (12/14/2023); U/S right lower extremity with ABI

## 2023-11-02 NOTE — Progress Notes (Signed)
  Progress Note    11/02/2023 8:47 AM 1 Day Post-Op  Subjective:  Emily Collins is a 58 yo female now POD #1 from aortogram and selective right lower extremity angiogram, with angioplasty of the right tibioperoneal trunk and angioplasty of the right SFA with stent placed to the right SFA.  On exam this morning patient is resting comfortably in bed.  Husband is at the bedside.  No complaints overnight.  Vitals all remained stable.   Vitals:   11/01/23 2325 11/02/23 0834  BP: (!) 131/57 113/74  Pulse: 73 78  Resp: 16 16  Temp: 98.3 F (36.8 C) 97.7 F (36.5 C)  SpO2: 96% 98%   Physical Exam: Cardiac:  RRR, normal S1 and S2.  No murmurs appreciated. Lungs: Clear on auscultation throughout, no rales rhonchi or wheezing. Incisions: Left groin incision with dressing clean dry and intact.  No hematoma seroma or infection to note. Extremities: Bilateral lower extremities warm to touch.  Right toe remains edematous with dry cracking skin.  Unable to palpate DP PT pulses. Abdomen: Positive bowel sounds throughout, soft, nontender and nondistended. Neurologic: And oriented x 3, answers all questions and follows commands appropriately.  CBC    Component Value Date/Time   WBC 11.0 (H) 11/02/2023 0809   RBC 3.88 11/02/2023 0809   HGB 11.0 (L) 11/02/2023 0809   HCT 36.2 11/02/2023 0809   PLT 285 11/02/2023 0809   MCV 93.3 11/02/2023 0809   MCH 28.4 11/02/2023 0809   MCHC 30.4 11/02/2023 0809   RDW 14.2 11/02/2023 0809   LYMPHSABS 1.5 11/02/2023 0809   MONOABS 0.4 11/02/2023 0809   EOSABS 0.4 11/02/2023 0809   BASOSABS 0.1 11/02/2023 0809    BMET    Component Value Date/Time   NA 137 11/02/2023 0809   NA 137 08/29/2016 0915   K 5.2 (H) 11/02/2023 0809   CL 105 11/02/2023 0809   CO2 21 (L) 11/02/2023 0809   GLUCOSE 154 (H) 11/02/2023 0809   BUN 30 (H) 11/02/2023 0809   BUN 15 08/29/2016 0915   CREATININE 1.80 (H) 11/02/2023 0809   CALCIUM  7.9 (L) 11/02/2023 0809   GFRNONAA  32 (L) 11/02/2023 0809   GFRAA 48 (L) 05/06/2019 0658    INR    Component Value Date/Time   INR 1.1 01/02/2021 1623     Intake/Output Summary (Last 24 hours) at 11/02/2023 0847 Last data filed at 11/01/2023 2050 Gross per 24 hour  Intake 260 ml  Output --  Net 260 ml     Assessment/Plan:  58 y.o. female is s/p aortogram with selective right lower extremity angiogram with angioplasty of the right tibioperoneal trunk and right SFA with stent placement to the right SFA as well.  1 Day Post-Op   Plan Per vascular surgery patient okay to discharge when medically stable. Patient will need to be discharged on aspirin  81 mg daily, Plavix  75 mg daily and Crestor  10 mg daily. Patient will follow-up with vascular surgery in 6 weeks with right lower extremity ultrasound with ABIs. Vascular surgery to sign off this case at this time.  DVT prophylaxis: Lovenox  50 mg subcu every 24 hours.   Emily [facility] 11/02/2023 8:47 AM

## 2023-11-02 NOTE — TOC Initial Note (Signed)
 Transition of Care [facility]) - Initial/Assessment Note    Patient Details  Name: Emily Collins MRN: [id number] Date of Birth: Sep 17, 1966  Transition of Care [facility]) CM/SW Contact:    Royanne JINNY Bernheim, RN Phone Number: 11/02/2023, 12:07 PM  Clinical Narrative:                    Transition of Care [facility]) - Inpatient Brief Assessment   Patient Details  Name: Emily Collins MRN: [id number] Date of Birth: 06-05-1966  Transition of Care [facility]) CM/SW Contact:    Royanne JINNY Bernheim, RN Phone Number: 11/02/2023, 12:07 PM   Clinical Narrative:    Transition of Care Asessment:                      Patient Goals and CMS Choice            Expected Discharge Plan and Services         Expected Discharge Date: 11/02/23                                    Prior Living Arrangements/Services                       Activities of Daily Living   ADL Screening (condition at time of admission) Independently performs ADLs?: Yes (appropriate for developmental age) Is the patient deaf or have difficulty hearing?: No Does the patient have difficulty seeing, even when wearing glasses/contacts?: No Does the patient have difficulty concentrating, remembering, or making decisions?: No  Permission Sought/Granted                  Emotional Assessment              Admission diagnosis:  Toe osteomyelitis, right (HCC) [M86.9] Cellulitis of right lower extremity [L03.115] Patient Active Problem List   Diagnosis Date Noted   Osteomyelitis (HCC) 10/31/2023   Toe osteomyelitis, right (HCC) 10/31/2023   Swelling of limb 05/08/2023   Lymphedema 05/08/2023   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)    Diabetic infection of right foot (HCC) 01/02/2021   Cellulitis of right lower extremity 01/02/2021   Diabetic ulcer of right foot (HCC) 01/02/2021   Asthma 01/02/2021   Depression with anxiety 01/02/2021   CKD (chronic kidney disease),  stage IIIa 01/02/2021   Normocytic anemia 01/02/2021   Wound infection_right ankle 01/02/2021   Aortic atherosclerosis (HCC) 12/13/2020   Thrombocytosis    Anemia of chronic disease    Leukocytosis    Hyperkalemia    Acute kidney injury superimposed on CKD (HCC)    Obesity, Class III, BMI 40-49.9 (morbid obesity) (HCC)    Cellulitis and abscess of foot, except toes    Right leg pain    Ulcer of right heel and midfoot with fat layer exposed (HCC)    Abscess of right leg    Uncontrolled type 2 diabetes mellitus with hyperglycemia (HCC)    Cellulitis 11/29/2020   Morbid obesity (HCC) 08/18/2020   DM type 1 with diabetic peripheral neuropathy (HCC) 06/09/2020   Sepsis (HCC) 04/26/2019   Asthma, intermittent 02/04/2018   Generalized anxiety disorder 02/04/2018   Type 1 diabetes mellitus with hyperglycemia (HCC) 02/04/2018   Arthritis 05/10/2016   Chronic kidney disease 04/05/2016   Allergic rhinitis 04/07/2015   Depression 04/07/2015   Type 1 diabetes mellitus with  hypoglycemia without coma (HCC) 04/07/2015   Diabetic retinopathy (HCC) 04/07/2015   Dysesthesia 04/07/2015   Family history of early CAD 04/07/2015   GERD (gastroesophageal reflux disease) 04/07/2015   Hyperlipidemia 04/07/2015   Insomnia 04/07/2015   Tobacco abuse 04/07/2015   Venous insufficiency 04/07/2015   Anxiety 04/07/2015   PCP:  Kandis Stefano Iles, MD Pharmacy:   [id number] - [place], [place] - [address] AT [facility] [address] [place] [address] Phone: [phone] Fax: [phone]  [facility] [id number] [place] [address] Phone: [phone] Fax: [phone]     Social Drivers of Health (SDOH) Social History: SDOH Screenings   Food Insecurity: No Food Insecurity (10/31/2023)  Housing: Low Risk  (10/31/2023)  Transportation Needs: No Transportation Needs (10/31/2023)  Utilities: Not At Risk (10/31/2023)   Tobacco Use: Medium Risk (10/31/2023)   SDOH Interventions:     Readmission Risk Interventions     No data to display

## 2023-11-02 NOTE — Progress Notes (Signed)
 BP ordered for 11/05/23

## 2023-11-02 NOTE — Progress Notes (Signed)
 PHARMACY - PHYSICIAN COMMUNICATION CRITICAL VALUE ALERT - BLOOD CULTURE IDENTIFICATION (BCID)  Emily Collins is an 58 y.o. female who presented to [facility] on 10/31/2023 with a chief complaint of diabetic foot infeciton  Assessment:  blood culture from 2/5 with 1 of 4 bottles GPC, BCID MRSE  Name of physician (or Provider) Contacted: Drs Fayette and Jhonny  Current antibiotics: vancomycin  and pip/tazo  Changes to prescribed antibiotics recommended:  Patient is on recommended antibiotics - No changes needed - suspect contaminant   Results for orders placed or performed during the hospital encounter of 10/31/23  Blood Culture ID Panel (Reflexed) (Collected: 10/31/2023  2:42 PM)  Result Value Ref Range   Enterococcus faecalis NOT DETECTED NOT DETECTED   Enterococcus Faecium NOT DETECTED NOT DETECTED   Listeria monocytogenes NOT DETECTED NOT DETECTED   Staphylococcus species DETECTED (A) NOT DETECTED   Staphylococcus aureus (BCID) NOT DETECTED NOT DETECTED   Staphylococcus epidermidis DETECTED (A) NOT DETECTED   Staphylococcus lugdunensis NOT DETECTED NOT DETECTED   Streptococcus species NOT DETECTED NOT DETECTED   Streptococcus agalactiae NOT DETECTED NOT DETECTED   Streptococcus pneumoniae NOT DETECTED NOT DETECTED   Streptococcus pyogenes NOT DETECTED NOT DETECTED   A.calcoaceticus-baumannii NOT DETECTED NOT DETECTED   Bacteroides fragilis NOT DETECTED NOT DETECTED   Enterobacterales NOT DETECTED NOT DETECTED   Enterobacter cloacae complex NOT DETECTED NOT DETECTED   Escherichia coli NOT DETECTED NOT DETECTED   Klebsiella aerogenes NOT DETECTED NOT DETECTED   Klebsiella oxytoca NOT DETECTED NOT DETECTED   Klebsiella pneumoniae NOT DETECTED NOT DETECTED   Proteus species NOT DETECTED NOT DETECTED   Salmonella species NOT DETECTED NOT DETECTED   Serratia marcescens NOT DETECTED NOT DETECTED   Haemophilus influenzae NOT DETECTED NOT DETECTED   Neisseria meningitidis NOT  DETECTED NOT DETECTED   Pseudomonas aeruginosa NOT DETECTED NOT DETECTED   Stenotrophomonas maltophilia NOT DETECTED NOT DETECTED   Candida albicans NOT DETECTED NOT DETECTED   Candida auris NOT DETECTED NOT DETECTED   Candida glabrata NOT DETECTED NOT DETECTED   Candida krusei NOT DETECTED NOT DETECTED   Candida parapsilosis NOT DETECTED NOT DETECTED   Candida tropicalis NOT DETECTED NOT DETECTED   Cryptococcus neoformans/gattii NOT DETECTED NOT DETECTED   Methicillin resistance mecA/C DETECTED (A) NOT DETECTED    Celestine Slovak, PharmD, BCPS, BCIDP Work Cell: [phone] 11/02/2023 9:44 AM

## 2023-11-02 NOTE — Progress Notes (Signed)
 Date of Admission:  10/31/2023     ID: Emily Collins is a 58 y.o. female Principal Problem:   Toe osteomyelitis, right (HCC) Active Problems:   Osteomyelitis (HCC)    Subjective: Pt is feeling better  Medications:   amoxicillin -clavulanate  1 tablet Oral Q12H   aspirin  EC  81 mg Oral Daily   ciprofloxacin   500 mg Oral BID   clopidogrel   75 mg Oral Daily   enoxaparin  (LOVENOX ) injection  50 mg Subcutaneous Q24H   ezetimibe   10 mg Oral Daily   famotidine   40 mg Oral Daily   FLUoxetine   40 mg Oral Daily   furosemide   20 mg Oral Daily   gabapentin   600 mg Oral TID   insulin  pump   Subcutaneous TID WC, HS, 0200   loratadine   10 mg Oral Daily   pantoprazole   40 mg Oral Daily   rosuvastatin   10 mg Oral Daily    Objective: Vital signs in last 24 hours: Patient Vitals for the past 24 hrs:  BP Temp Temp src Pulse Resp SpO2  11/02/23 0834 113/74 97.7 F (36.5 C) Oral 78 16 98 %  11/01/23 2325 (!) 131/57 98.3 F (36.8 C) Oral 73 16 96 %  11/01/23 2034 -- -- -- -- -- 96 %  11/01/23 1722 (!) 134/51 97.9 F (36.6 C) -- 73 -- 94 %  11/01/23 1645 (!) 133/55 -- -- 73 20 96 %  11/01/23 1630 132/62 -- -- 70 17 98 %  11/01/23 1621 123/62 -- -- 70 15 95 %  11/01/23 1620 (!) 153/72 -- -- (!) 0 -- --  11/01/23 1615 (!) 153/72 -- -- 76 19 100 %  11/01/23 1610 (!) 145/66 -- -- 66 16 100 %  11/01/23 1605 (!) 144/69 -- -- 69 13 100 %  11/01/23 1600 (!) 156/72 -- -- 68 12 100 %  11/01/23 1555 (!) 155/70 -- -- 70 12 100 %  11/01/23 1550 (!) 146/69 -- -- 72 15 98 %  11/01/23 1545 (!) 144/66 -- -- 69 17 95 %      PHYSICAL EXAM:  General: Alert, cooperative, no distress, appears stated age.  Lungs: Clear to auscultation bilaterally. No Wheezing or Rhonchi. No rales. Heart: Regular rate and rhythm, no murmur, rub or gallop. Abdomen: Soft, non-tender,not distended. Bowel sounds normal. No masses Extremities:      Skin: No rashes or lesions. Or bruising Lymph: Cervical,  supraclavicular normal. Neurologic: Grossly non-focal  Lab Results    Latest Ref Rng & Units 11/02/2023    8:09 AM 10/31/2023    8:47 AM 10/29/2023    3:50 PM  CBC  WBC 4.0 - 10.5 K/uL 11.0  9.5  13.5   Hemoglobin 12.0 - 15.0 g/dL 88.9  88.9  88.7   Hematocrit 36.0 - 46.0 % 36.2  35.9  36.1   Platelets 150 - 400 K/uL 285  283  280        Latest Ref Rng & Units 11/02/2023    8:09 AM 11/02/2023    4:28 AM 11/01/2023    4:02 AM  CMP  Glucose 70 - 99 mg/dL 845  880    BUN 6 - 20 mg/dL 30     Creatinine 9.55 - 1.00 mg/dL 8.19   8.51   Sodium 864 - 145 mmol/L 137     Potassium 3.5 - 5.1 mmol/L 5.2     Chloride 98 - 111 mmol/L 105     CO2 22 -  32 mmol/L 21     Calcium  8.9 - 10.3 mg/dL 7.9         Microbiology: BC staph epi 1/4 - cpntaminant WC pendig Studies/Results: MR FOOT RIGHT WO CONTRAST Result Date: 11/02/2023 CLINICAL DATA:  Right great toe and foot pain. Concern for osteomyelitis. EXAM: MRI OF THE RIGHT FOREFOOT WITHOUT CONTRAST TECHNIQUE: Multiplanar, multisequence MR imaging of the right forefoot was performed. No intravenous contrast was administered. COMPARISON:  Right foot x-rays dated October 31, 2023. FINDINGS: Bones/Joint/Cartilage Prominent marrow edema with corresponding decreased T1 marrow signal involving all of the first distal phalanx. Lesser marrow edema involving distal first proximal phalanx. Remaining marrow signal is unremarkable. No fracture or dislocation. Joint spaces are preserved. No joint effusion. Ligaments Collateral ligaments are intact.  Lisfranc ligament is intact. Muscles and Tendons Flexor and extensor tendons are intact. No tenosynovitis. No muscle edema or atrophy. Soft tissue Dorsal foot soft tissue swelling extending into the great toe. No drainable fluid collection. No soft tissue mass. IMPRESSION: 1. Osteomyelitis of the first distal phalanx. 2. Lesser marrow edema involving the distal first proximal phalanx, which could reflect early osteomyelitis. 3.  Dorsal foot soft tissue swelling extending into the great toe. No drainable abscess. Electronically Signed   By: Elsie ONEIDA Shoulder M.D.   On: 11/02/2023 11:34   PERIPHERAL VASCULAR CATHETERIZATION Result Date: 11/01/2023 See surgical note for result.    Assessment/Plan: 58 year old female with history of diabetes mellitus with a hemoglobin A1c of 8.2 presents with right great toe swelling and the wound for last 3 to 4 weeks Right great toe soft tissue infection started as a split in the skin Even though x-ray questions lucency of the distal phalanx, clinically does not look like osteomyelitis But MRI has confirmed osteo Pt does not want any surgical intervention now Until culture is back will do Cipro  and augmentin  X 2 weeks Will follow her as OP   Diabetes mellitus has an insulin  pump   Hyperlipidemia on rosuvastatin  and Zetia    CKD  Discussed the management with patient and her partner and care team

## 2023-11-02 NOTE — Progress Notes (Signed)
 PODIATRY / FOOT AND ANKLE SURGERY PROGRESS NOTE  Requesting Physician: Dr. Laurita  Reason for consult: R toe infection   HPI: Emily Collins is a 58 y.o. female who presents today resting in bed comfortably.  She feels as though the foot is less swollen and painful today.  She has some mild discomfort to her right big toe today but otherwise is doing pretty well.  Patient denies nausea, vomiting, fevers, chills.  PMHx:  Past Medical History:  Diagnosis Date   Allergy    Anxiety    Arthritis    Asthma    Depression    Diabetes mellitus without complication (HCC)    GERD (gastroesophageal reflux disease)    History of chicken pox 04/07/2015   DID have Chicken Pox.     Hyperlipidemia    Hypertension    Neuromuscular disorder (HCC)    Venous (peripheral) insufficiency     Surgical Hx:  Past Surgical History:  Procedure Laterality Date   APPENDECTOMY  1979   APPLICATION OF WOUND VAC Right 01/07/2021   Procedure: APPLICATION OF WOUND VAC;  Surgeon: Silva Juliene SAUNDERS, DPM;  Location: [facility];  Service: Podiatry;  Laterality: Right;   BILATERAL CARPAL TUNNEL RELEASE Bilateral    Dr. Kathlynn   CESAREAN SECTION  2003   GRAFT APPLICATION Right 01/07/2021   Procedure: GRAFT APPLICATION;  Surgeon: Silva Juliene SAUNDERS, DPM;  Location: [facility];  Service: Podiatry;  Laterality: Right;   INCISION AND DRAINAGE Right 12/01/2020   Procedure: INCISION AND DRAINAGE;  Surgeon: Silva Juliene SAUNDERS, DPM;  Location: [facility];  Service: Podiatry;  Laterality: Right;   INCISION AND DRAINAGE Right 12/07/2020   Procedure: INCISION AND DRAINAGE;  Surgeon: Janit Thresa HERO, DPM;  Location: [facility];  Service: Podiatry;  Laterality: Right;   INCISION AND DRAINAGE OF WOUND Right 01/07/2021   Procedure: IRRIGATION AND DEBRIDEMENT WOUND;  Surgeon: Silva Juliene SAUNDERS, DPM;  Location: [facility];  Service: Podiatry;  Laterality: Right;  regional block if possible   LAPAROSCOPIC LYSIS OF ADHESIONS     LOWER EXTREMITY ANGIOGRAPHY  Right 11/01/2023   Procedure: Lower Extremity Angiography;  Surgeon: Marea Selinda RAMAN, MD;  Location: [facility];  Service: Cardiovascular;  Laterality: Right;   SHOULDER SURGERY Right 08/2009   Dr. Kathlynn   TUBAL LIGATION      FHx:  Family History  Problem Relation Age of Onset   Hypertension Mother    Diabetes Mother    Hyperlipidemia Mother    Arthritis Mother    Heart disease Father    Kidney disease Father     Social History:  reports that she quit smoking about 6 years ago. Her smoking use included cigarettes. She started smoking about 38 years ago. She has a 32.5 pack-year smoking history. She has never used smokeless tobacco. She reports current alcohol use of about 1.0 - 2.0 standard drink of alcohol per week. She reports that she does not use drugs.  Allergies:  Allergies  Allergen Reactions   Atorvastatin      Muscle and join pains.   Levaquin   [Levofloxacin  In D5w]     GI upset   Simvastatin     joint aches.   Dilaudid  [Hydromorphone Hcl] Hives, Itching and Rash   Tetracycline Rash    Medications Prior to Admission  Medication Sig Dispense Refill   amitriptyline  (ELAVIL ) 25 MG tablet Take 25 mg by mouth at bedtime as needed (neuropathy pain).     aspirin  81 MG  tablet Take 81 mg by mouth daily.     cetirizine  (ZYRTEC ) 10 MG tablet TAKE 1 TABLET(10 MG) BY MOUTH DAILY (Patient taking differently: Take 10 mg by mouth daily.) 90 tablet 1   esomeprazole  (NEXIUM ) 40 MG capsule Take 40 mg by mouth daily.     ezetimibe  (ZETIA ) 10 MG tablet Take 1 tablet (10 mg total) by mouth daily. 90 tablet 3   FLUoxetine  (PROZAC ) 20 MG capsule Take 1 capsule (20 mg total) by mouth daily. (Patient taking differently: Take 40 mg by mouth daily.)     furosemide  (LASIX ) 20 MG tablet Take 1 tablet (20 mg total) by mouth daily. 90 tablet 3   gabapentin  (NEURONTIN ) 300 MG capsule Take 3 capsules by mouth at bedtime.     HUMALOG  KWIKPEN 200 UNIT/ML KwikPen Inject 80 Units into the skin  daily.     meloxicam  (MOBIC ) 15 MG tablet Take 15 mg by mouth daily.     Semaglutide-Weight Management 0.5 MG/0.5ML SOAJ Inject 0.5 mg into the skin once a week.     albuterol  (VENTOLIN  HFA) 108 (90 Base) MCG/ACT inhaler Inhale 2 puffs into the lungs every 6 (six) hours as needed for wheezing or shortness of breath. (Patient not taking: Reported on 11/01/2023) 8 g 2   Continuous Glucose Sensor (DEXCOM G6 SENSOR) MISC SMARTSIG:Topical Every 10 Days     Continuous Glucose Transmitter (DEXCOM G6 TRANSMITTER) MISC USE TO MONITOR BLOOD SUGAR. REPLACE EVERY 3 MONTHS     Insulin  Disposable Pump (OMNIPOD 5 DEXG7G6 PODS GEN 5) MISC SMARTSIG:1 Each SUB-Q Every 3 Days      Physical Exam: General: Alert and oriented.  No apparent distress.  Vascular: Difficult to palpate DP/PT pulses bilaterally due to moderate swelling.  Lymphedema present to bilateral lower extremities.  Capillary fill time intact to digits.  No hair growth present to bilateral lower extremities.  Neuro: Light touch sensation reduced to bilateral lower extremities.  Derm: Scars present to the anterior medial and anterior lateral aspects of the ankle, well-healed, no openings.  Right hallux wounds appear to be completely epithelialized today, no openings, does still have some associated erythema and edema, appears to be improved some since initial hospitalization.  Right fourth toe with lateral small ulceration at the PIPJ likely related from the fourth and fifth toes rubbing together, appears to be macerated, appears fairly superficial and measures approximately 0.1 x 0.1 x 0.1 cm.  MSK: Dropfoot right side.  Results for orders placed or performed during the hospital encounter of 10/31/23 (from the past 48 hours)  CBG monitoring, ED     Status: Abnormal   Collection Time: 10/31/23  2:41 PM  Result Value Ref Range   Glucose-Capillary 176 (H) 70 - 99 mg/dL    Comment: Glucose reference range applies only to samples taken after fasting  for at least 8 hours.  MRSA Next Gen by PCR, Nasal     Status: Abnormal   Collection Time: 10/31/23  2:42 PM   Specimen: Nasal Mucosa; Nasal Swab  Result Value Ref Range   MRSA by PCR Next Gen DETECTED (A) NOT DETECTED    Comment: RESULT CALLED TO, READ BACK BY AND VERIFIED WITH: JESS SAVAGE 10/31/23 1826 KLW (NOTE) The GeneXpert MRSA Assay (FDA approved for NASAL specimens only), is one component of a comprehensive MRSA colonization surveillance program. It is not intended to diagnose MRSA infection nor to guide or monitor treatment for MRSA infections. Test performance is not FDA approved in patients less than 2  years old. Performed at [facility], [address] [place], [place] [address]   HIV Antibody (routine testing w rflx)     Status: None   Collection Time: 10/31/23  2:42 PM  Result Value Ref Range   HIV Screen 4th Generation wRfx Non Reactive Non Reactive    Comment: Performed at [facility], [address] [place], [place] [address]  Hemoglobin A1c     Status: Abnormal   Collection Time: 10/31/23  2:42 PM  Result Value Ref Range   Hgb A1c MFr Bld 8.2 (H) 4.8 - 5.6 %    Comment: (NOTE) Pre diabetes:          5.7%-6.4%  Diabetes:              >6.4%  Glycemic control for   <7.0% adults with diabetes    Mean Plasma Glucose 188.64 mg/dL    Comment: Performed at [facility], [address], [place], [place] [address]  Culture, blood (Routine X 2) w Reflex to ID Panel     Status: None (Preliminary result)   Collection Time: 10/31/23  2:42 PM   Specimen: BLOOD LEFT ARM  Result Value Ref Range   Specimen Description BLOOD LEFT ARM    Special Requests      BOTTLES DRAWN AEROBIC AND ANAEROBIC Blood Culture results may not be optimal due to an inadequate volume of blood received in culture bottles   Culture  Setup Time      GRAM POSITIVE COCCI ANAEROBIC BOTTLE ONLY Organism ID to follow CRITICAL RESULT CALLED TO, READ BACK BY AND VERIFIED  WITH: JESUSA HILARIO SEVER 11/02/2023 LRL Performed at [facility], [address] [place], [place] [address]    Culture GRAM POSITIVE COCCI    Report Status PENDING   Culture, blood (Routine X 2) w Reflex to ID Panel     Status: None (Preliminary result)   Collection Time: 10/31/23  2:42 PM   Specimen: BLOOD RIGHT ARM  Result Value Ref Range   Specimen Description BLOOD RIGHT ARM    Special Requests      BOTTLES DRAWN AEROBIC AND ANAEROBIC Blood Culture results may not be optimal due to an inadequate volume of blood received in culture bottles   Culture      NO GROWTH 2 DAYS Performed at [facility], [address] [place], [place] [address]    Report Status PENDING   Sedimentation rate     Status: Abnormal   Collection Time: 10/31/23  2:42 PM  Result Value Ref Range   Sed Rate 58 (H) 0 - 30 mm/hr    Comment: Performed at [facility], [address] [place], [place] [address]  C-reactive protein     Status: Abnormal   Collection Time: 10/31/23  2:42 PM  Result Value Ref Range   CRP 4.0 (H) <1.0 mg/dL    Comment: Performed at [facility], [address], [place], [place] [address]  Blood Culture ID Panel (Reflexed)     Status: Abnormal   Collection Time: 10/31/23  2:42 PM  Result Value Ref Range   Enterococcus faecalis NOT DETECTED NOT DETECTED   Enterococcus Faecium NOT DETECTED NOT DETECTED   Listeria monocytogenes NOT DETECTED NOT DETECTED   Staphylococcus species DETECTED (A) NOT DETECTED    Comment: CRITICAL RESULT CALLED TO, READ BACK BY AND VERIFIED WITH: SHEENA, H. 0844 11/02/2023 LRL    Staphylococcus aureus (BCID) NOT DETECTED NOT  DETECTED   Staphylococcus epidermidis DETECTED (A) NOT DETECTED    Comment: Methicillin (oxacillin) resistant coagulase negative staphylococcus. Possible blood culture contaminant (unless isolated from more than one blood culture draw or clinical case suggests pathogenicity). No antibiotic treatment is  indicated for blood  culture contaminants. CRITICAL RESULT CALLED TO, READ BACK BY AND VERIFIED WITH: JESUSA DEL. 0844 11/02/2023 LRL    Staphylococcus lugdunensis NOT DETECTED NOT DETECTED   Streptococcus species NOT DETECTED NOT DETECTED   Streptococcus agalactiae NOT DETECTED NOT DETECTED   Streptococcus pneumoniae NOT DETECTED NOT DETECTED   Streptococcus pyogenes NOT DETECTED NOT DETECTED   A.calcoaceticus-baumannii NOT DETECTED NOT DETECTED   Bacteroides fragilis NOT DETECTED NOT DETECTED   Enterobacterales NOT DETECTED NOT DETECTED   Enterobacter cloacae complex NOT DETECTED NOT DETECTED   Escherichia coli NOT DETECTED NOT DETECTED   Klebsiella aerogenes NOT DETECTED NOT DETECTED   Klebsiella oxytoca NOT DETECTED NOT DETECTED   Klebsiella pneumoniae NOT DETECTED NOT DETECTED   Proteus species NOT DETECTED NOT DETECTED   Salmonella species NOT DETECTED NOT DETECTED   Serratia marcescens NOT DETECTED NOT DETECTED   Haemophilus influenzae NOT DETECTED NOT DETECTED   Neisseria meningitidis NOT DETECTED NOT DETECTED   Pseudomonas aeruginosa NOT DETECTED NOT DETECTED   Stenotrophomonas maltophilia NOT DETECTED NOT DETECTED   Candida albicans NOT DETECTED NOT DETECTED   Candida auris NOT DETECTED NOT DETECTED   Candida glabrata NOT DETECTED NOT DETECTED   Candida krusei NOT DETECTED NOT DETECTED   Candida parapsilosis NOT DETECTED NOT DETECTED   Candida tropicalis NOT DETECTED NOT DETECTED   Cryptococcus neoformans/gattii NOT DETECTED NOT DETECTED   Methicillin resistance mecA/C DETECTED (A) NOT DETECTED    Comment: CRITICAL RESULT CALLED TO, READ BACK BY AND VERIFIED WITH: JESUSA HILARIO SEVER 11/02/2023 LRL Performed at [facility], [address] [place], [place] [address]   Aerobic/Anaerobic Culture w Gram Stain (surgical/deep wound)     Status: None (Preliminary result)   Collection Time: 10/31/23  6:29 PM   Specimen: Toe; Wound  Result Value Ref Range   Specimen  Description      TOE Performed at [facility], [address], [place], [place] [address]    Special Requests      RIGHT TOE Performed at [facility], [address] [place], [place] [address]    Gram Stain      NO WBC SEEN NO ORGANISMS SEEN Performed at [facility], [address] [place], [place] [address]    Culture      CULTURE REINCUBATED FOR BETTER GROWTH NO ANAEROBES ISOLATED; CULTURE IN PROGRESS FOR 5 DAYS    Report Status PENDING   CBG monitoring, ED     Status: Abnormal   Collection Time: 10/31/23  6:33 PM  Result Value Ref Range   Glucose-Capillary 222 (H) 70 - 99 mg/dL    Comment: Glucose reference range applies only to samples taken after fasting for at least 8 hours.  Glucose, capillary     Status: Abnormal   Collection Time: 10/31/23  9:35 PM  Result Value Ref Range   Glucose-Capillary 100 (H) 70 - 99 mg/dL    Comment: Glucose reference range applies only to samples taken after fasting for at least 8 hours.  Glucose, capillary     Status: Abnormal   Collection Time: 11/01/23  2:28 AM  Result Value Ref Range   Glucose-Capillary 159 (H) 70 - 99 mg/dL    Comment: Glucose reference range applies  only to samples taken after fasting for at least 8 hours.  Creatinine, serum     Status: Abnormal   Collection Time: 11/01/23  4:02 AM  Result Value Ref Range   Creatinine, Ser 1.48 (H) 0.44 - 1.00 mg/dL   GFR, Estimated 41 (L) >60 mL/min    Comment: (NOTE) Calculated using the CKD-EPI Creatinine Equation (2021) Performed at [facility], [address] [place], [place] [address]   Glucose, capillary     Status: None   Collection Time: 11/01/23  9:01 AM  Result Value Ref Range   Glucose-Capillary 99 70 - 99 mg/dL    Comment: Glucose reference range applies only to samples taken after fasting for at least 8 hours.  Glucose, capillary     Status: Abnormal   Collection Time: 11/01/23 11:31 AM  Result Value Ref Range    Glucose-Capillary 107 (H) 70 - 99 mg/dL    Comment: Glucose reference range applies only to samples taken after fasting for at least 8 hours.  Glucose, capillary     Status: Abnormal   Collection Time: 11/01/23  5:54 PM  Result Value Ref Range   Glucose-Capillary 206 (H) 70 - 99 mg/dL    Comment: Glucose reference range applies only to samples taken after fasting for at least 8 hours.  Glucose, capillary     Status: Abnormal   Collection Time: 11/01/23  8:40 PM  Result Value Ref Range   Glucose-Capillary 411 (H) 70 - 99 mg/dL    Comment: Glucose reference range applies only to samples taken after fasting for at least 8 hours.  Glucose, capillary     Status: Abnormal   Collection Time: 11/02/23  3:13 AM  Result Value Ref Range   Glucose-Capillary 28 (LL) 70 - 99 mg/dL    Comment: Glucose reference range applies only to samples taken after fasting for at least 8 hours.   Comment 1 Notify RN   Glucose, capillary     Status: Abnormal   Collection Time: 11/02/23  3:19 AM  Result Value Ref Range   Glucose-Capillary 19 (LL) 70 - 99 mg/dL    Comment: Glucose reference range applies only to samples taken after fasting for at least 8 hours.   Comment 1 Repeat Test   Glucose, capillary     Status: Abnormal   Collection Time: 11/02/23  3:27 AM  Result Value Ref Range   Glucose-Capillary 198 (H) 70 - 99 mg/dL    Comment: Glucose reference range applies only to samples taken after fasting for at least 8 hours.  Glucose, random     Status: Abnormal   Collection Time: 11/02/23  4:28 AM  Result Value Ref Range   Glucose, Bld 119 (H) 70 - 99 mg/dL    Comment: Glucose reference range applies only to samples taken after fasting for at least 8 hours. Performed at [facility], [address] [place], [place] [address]   Glucose, capillary     Status: None   Collection Time: 11/02/23  6:22 AM  Result Value Ref Range   Glucose-Capillary 89 70 - 99 mg/dL    Comment: Glucose reference range  applies only to samples taken after fasting for at least 8 hours.  CBC with Differential/Platelet     Status: Abnormal   Collection Time: 11/02/23  8:09 AM  Result Value Ref Range   WBC 11.0 (H) 4.0 - 10.5 K/uL   RBC 3.88 3.87 - 5.11 MIL/uL   Hemoglobin 11.0 (L) 12.0 -  15.0 g/dL   HCT 63.7 63.9 - 53.9 %   MCV 93.3 80.0 - 100.0 fL   MCH 28.4 26.0 - 34.0 pg   MCHC 30.4 30.0 - 36.0 g/dL   RDW 85.7 88.4 - 84.4 %   Platelets 285 150 - 400 K/uL   nRBC 0.0 0.0 - 0.2 %   Neutrophils Relative % 78 %   Neutro Abs 8.6 (H) 1.7 - 7.7 K/uL   Lymphocytes Relative 13 %   Lymphs Abs 1.5 0.7 - 4.0 K/uL   Monocytes Relative 4 %   Monocytes Absolute 0.4 0.1 - 1.0 K/uL   Eosinophils Relative 4 %   Eosinophils Absolute 0.4 0.0 - 0.5 K/uL   Basophils Relative 1 %   Basophils Absolute 0.1 0.0 - 0.1 K/uL   Immature Granulocytes 0 %   Abs Immature Granulocytes 0.04 0.00 - 0.07 K/uL    Comment: Performed at [facility], [address], [place], [place] [address]  Basic metabolic panel     Status: Abnormal   Collection Time: 11/02/23  8:09 AM  Result Value Ref Range   Sodium 137 135 - 145 mmol/L   Potassium 5.2 (H) 3.5 - 5.1 mmol/L   Chloride 105 98 - 111 mmol/L   CO2 21 (L) 22 - 32 mmol/L   Glucose, Bld 154 (H) 70 - 99 mg/dL    Comment: Glucose reference range applies only to samples taken after fasting for at least 8 hours.   BUN 30 (H) 6 - 20 mg/dL   Creatinine, Ser 8.19 (H) 0.44 - 1.00 mg/dL   Calcium  7.9 (L) 8.9 - 10.3 mg/dL   GFR, Estimated 32 (L) >60 mL/min    Comment: (NOTE) Calculated using the CKD-EPI Creatinine Equation (2021)    Anion gap 11 5 - 15    Comment: Performed at [facility], [address] [place], [place] [address]  Glucose, capillary     Status: Abnormal   Collection Time: 11/02/23  9:26 AM  Result Value Ref Range   Glucose-Capillary 378 (H) 70 - 99 mg/dL    Comment: Glucose reference range applies only to samples taken after fasting for at least 8  hours.  Glucose, capillary     Status: Abnormal   Collection Time: 11/02/23 11:21 AM  Result Value Ref Range   Glucose-Capillary 320 (H) 70 - 99 mg/dL    Comment: Glucose reference range applies only to samples taken after fasting for at least 8 hours.   MR FOOT RIGHT WO CONTRAST Result Date: 11/02/2023 CLINICAL DATA:  Right great toe and foot pain. Concern for osteomyelitis. EXAM: MRI OF THE RIGHT FOREFOOT WITHOUT CONTRAST TECHNIQUE: Multiplanar, multisequence MR imaging of the right forefoot was performed. No intravenous contrast was administered. COMPARISON:  Right foot x-rays dated October 31, 2023. FINDINGS: Bones/Joint/Cartilage Prominent marrow edema with corresponding decreased T1 marrow signal involving all of the first distal phalanx. Lesser marrow edema involving distal first proximal phalanx. Remaining marrow signal is unremarkable. No fracture or dislocation. Joint spaces are preserved. No joint effusion. Ligaments Collateral ligaments are intact.  Lisfranc ligament is intact. Muscles and Tendons Flexor and extensor tendons are intact. No tenosynovitis. No muscle edema or atrophy. Soft tissue Dorsal foot soft tissue swelling extending into the great toe. No drainable fluid collection. No soft tissue mass. IMPRESSION: 1. Osteomyelitis of the first distal phalanx. 2. Lesser marrow edema involving the distal first proximal phalanx, which could reflect early osteomyelitis. 3. Dorsal foot soft tissue swelling extending into  the great toe. No drainable abscess. Electronically Signed   By: Elsie ONEIDA Shoulder M.D.   On: 11/02/2023 11:34   PERIPHERAL VASCULAR CATHETERIZATION Result Date: 11/01/2023 See surgical note for result.   Blood pressure 113/74, pulse 78, temperature 97.7 F (36.5 C), temperature source Oral, resp. rate 16, height 5' 2 (1.575 m), weight 108 kg, last menstrual period 07/15/2018, SpO2 98%.  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 -Imaging reviewed and discussed with patient in detail.  Appears show early onset osteomyelitis to the distal phalanx laterally near the IPJ hallux.  MRI reviewed today showing osteomyelitis of the right hallux -Discussed wounds present to the right foot as well as lymphedema and dropfoot.  PVD appears to be fairly mild. -Appreciate vascular recommendations. -Applied dressing today as follows.  Recommend every other day dressing changes to the right foot consisting of bacitracin to the dorsal toe wound area, Betadine gauze between the fourth and fifth toes, dry gauze between all the other toes, Kerlix, Ace wrap with moderate compression that goes from the forefoot to below knee for lymphedema swelling. -Discussed treatment options with patient.  Patient still declines amputation at this time.  Discussed with patient that we can treat with prolonged antibiotics to see if this will improve the situation.  If remains unimproved or worsening then patient knows she will likely need at least a partial amputation of the toe.  Patient understands and would like to proceed with conservative management. -Appreciate infectious disease recommendations. -Wound culture to date with no growth.  Podiatry team to sign off at this time.  Would like to see patient in outpatient clinic in about 2 weeks for follow-up.  Prentice Lee, DPM 11/02/2023, 12:55 PM

## 2023-11-02 NOTE — Discharge Summary (Signed)
 Physician Discharge Summary  Emily Collins FMW:[id number] DOB: 05-27-1966 DOA: 10/31/2023  PCP: Kandis Stefano Iles, MD  Admit date: 10/31/2023 Discharge date: 11/02/2023  Admitted From: Home Disposition:  Home  Recommendations for Outpatient Follow-up:  Follow up with PCP in 1-2 weeks Follow up with podiatry 1-2 weeks  Home Health:No  Equipment/Devices:None   Discharge Condition:Stable  CODE STATUS:FULL  Diet recommendation: Carb mod  Brief/Interim Summary:  58 y.o. female with medical history significant of IDDM, HTN, HLD, CKD stage IIIb, presented with worsening of right big toe swelling rash and pain.   Symptoms started 3 weeks ago, patient started develop rash and swelling of right big toe.  She went to see urgent care center and was told  could be infection, could be gout the patient never had history of gout before.  And blood work did show she has slightly elevated uric acid.  Patient was given 1 week course of Keflex  for the visit.  However the symptoms of right big toe swelling rash and pain continues after 7 days of Keflex  and last week patient also developed cracks in the skin on top of the right big toe with some bleeding but no significant past from the bone.  Denied any fever chills and she does not remember any direct injuries to the right big toe.   Status post bedside debridement by podiatry on 2/5.  Tolerated well.  Scheduled for vascular angiography with vascular surgery on 2/6.  Infectious disease consulted as well.    Discharge Diagnoses:  Principal Problem:   Toe osteomyelitis, right (HCC) Active Problems:   Osteomyelitis (HCC)  Right hallux osteomyelitis and cellulitis Status post bedside debridement with podiatry 2/5 5.  Tolerated procedure well.  Pain mild to moderate. Plan: MRI done on day of discharge did confirm osteomyelitis.  Case discussed with podiatry and infectious disease.  Physical exam is overall reassuring.  At this point we will attempt  to treat with aggressive p.o. antibiotic therapy.  2-week course of ciprofloxacin  and cefadroxil provided on discharge.  Close follow-up with podiatry recommended.  Patient aware that she will remain a high risk for further amputation should she not respond to oral antibiotic therapy.  She expresses understanding.  Pain regimen provided as well.  Ambulating freely.  Stable for discharge home.   Discharge Instructions  Discharge Instructions     Ambulatory Referral for Lung Cancer Scre   Complete by: As directed    Diet - low sodium heart healthy   Complete by: As directed    Increase activity slowly   Complete by: As directed    No wound care   Complete by: As directed       Allergies as of 11/02/2023       Reactions   Atorvastatin     Muscle and join pains.   Levaquin   [levofloxacin  In D5w]    GI upset   Simvastatin    joint aches.   Dilaudid  [hydromorphone Hcl] Hives, Itching, Rash   Tetracycline Rash        Medication List     STOP taking these medications    meloxicam  15 MG tablet Commonly known as: MOBIC        TAKE these medications    albuterol  108 (90 Base) MCG/ACT inhaler Commonly known as: VENTOLIN  HFA Inhale 2 puffs into the lungs every 6 (six) hours as needed for wheezing or shortness of breath.   amitriptyline  25 MG tablet Commonly known as: ELAVIL  Take 25 mg by mouth at  bedtime as needed (neuropathy pain).   amoxicillin -clavulanate 875-125 MG tablet Commonly known as: AUGMENTIN  Take 1 tablet by mouth every 12 (twelve) hours for 14 days.   aspirin  81 MG tablet Take 81 mg by mouth daily.   cetirizine  10 MG tablet Commonly known as: ZYRTEC  TAKE 1 TABLET(10 MG) BY MOUTH DAILY What changed: See the new instructions.   ciprofloxacin  500 MG tablet Commonly known as: CIPRO  Take 1 tablet (500 mg total) by mouth 2 (two) times daily for 14 days.   clopidogrel  75 MG tablet Commonly known as: PLAVIX  Take 1 tablet (75 mg total) by mouth  daily. Start taking on: November 03, 2023   Dexcom G6 Sensor Misc SMARTSIG:Topical Every 10 Days   Dexcom G6 Transmitter Misc USE TO MONITOR BLOOD SUGAR. REPLACE EVERY 3 MONTHS   esomeprazole  40 MG capsule Commonly known as: NEXIUM  Take 40 mg by mouth daily.   ezetimibe  10 MG tablet Commonly known as: ZETIA  Take 1 tablet (10 mg total) by mouth daily.   FLUoxetine  20 MG capsule Commonly known as: PROZAC  Take 1 capsule (20 mg total) by mouth daily. What changed: how much to take   furosemide  20 MG tablet Commonly known as: LASIX  Take 1 tablet (20 mg total) by mouth daily.   gabapentin  300 MG capsule Commonly known as: NEURONTIN  Take 3 capsules by mouth at bedtime.   HumaLOG  KwikPen 200 UNIT/ML KwikPen Generic drug: insulin  lispro Inject 80 Units into the skin daily.   Omnipod 5 DexG7G6 Pods Gen 5 Misc SMARTSIG:1 Each SUB-Q Every 3 Days   oxyCODONE  5 MG immediate release tablet Commonly known as: Oxy IR/ROXICODONE  Take 1 tablet (5 mg total) by mouth every 4 (four) hours as needed for moderate pain (pain score 4-6).   rosuvastatin  10 MG tablet Commonly known as: CRESTOR  Take 1 tablet (10 mg total) by mouth daily. Start taking on: November 03, 2023   Semaglutide-Weight Management 0.5 MG/0.5ML Soaj Inject 0.5 mg into the skin once a week.   Stimulant Laxative 8.6-50 MG tablet Generic drug: senna-docusate Take 1 tablet by mouth daily.        Follow-up Information     Delores Orvin BRAVO, NP. Schedule an appointment as soon as possible for a visit in 6 week(s).   Specialty: Vascular Surgery Why: U/S right lower extremity with ABI  Called during lunch hours; left a vm with patient's name, dob, phone number, and email to reach out to patient. Contact information: [address] [place] [address] [phone]         Lennie Barter, DPM. Call in 2 week(s).   Specialty: Podiatry Why: Follow up as directed  Called during lunch hours. Please  call [phone] to schedule an appointment., For wound re-check Contact information: [address] [place] [address] [phone]                Allergies  Allergen Reactions   Atorvastatin      Muscle and join pains.   Levaquin   [Levofloxacin  In D5w]     GI upset   Simvastatin     joint aches.   Dilaudid  [Hydromorphone Hcl] Hives, Itching and Rash   Tetracycline Rash    Consultations: Podiatry Infectious disease   Procedures/Studies: MR FOOT RIGHT WO CONTRAST Result Date: 11/02/2023 CLINICAL DATA:  Right great toe and foot pain. Concern for osteomyelitis. EXAM: MRI OF THE RIGHT FOREFOOT WITHOUT CONTRAST TECHNIQUE: Multiplanar, multisequence MR imaging of the right forefoot was performed. No intravenous contrast  was administered. COMPARISON:  Right foot x-rays dated October 31, 2023. FINDINGS: Bones/Joint/Cartilage Prominent marrow edema with corresponding decreased T1 marrow signal involving all of the first distal phalanx. Lesser marrow edema involving distal first proximal phalanx. Remaining marrow signal is unremarkable. No fracture or dislocation. Joint spaces are preserved. No joint effusion. Ligaments Collateral ligaments are intact.  Lisfranc ligament is intact. Muscles and Tendons Flexor and extensor tendons are intact. No tenosynovitis. No muscle edema or atrophy. Soft tissue Dorsal foot soft tissue swelling extending into the great toe. No drainable fluid collection. No soft tissue mass. IMPRESSION: 1. Osteomyelitis of the first distal phalanx. 2. Lesser marrow edema involving the distal first proximal phalanx, which could reflect early osteomyelitis. 3. Dorsal foot soft tissue swelling extending into the great toe. No drainable abscess. Electronically Signed   By: Elsie ONEIDA Shoulder M.D.   On: 11/02/2023 11:34   PERIPHERAL VASCULAR CATHETERIZATION Result Date: 11/01/2023 See surgical note for result.  DG Foot Complete Right Result Date: 10/31/2023 CLINICAL  DATA:  Right foot pain for 3 weeks without reported injury. EXAM: RIGHT FOOT COMPLETE - 3+ VIEW COMPARISON:  November 29, 2020. FINDINGS: There is lucency seen involving the first distal phalanx concerning for possible osteomyelitis. Joint spaces are intact. Mild posterior calcaneal spurring is noted. Vascular calcifications are noted. IMPRESSION: Findings concerning for osteomyelitis involving first distal phalanx. Electronically Signed   By: Lynwood Landy Raddle M.D.   On: 10/31/2023 12:23      Subjective: Seen and examined on the day of discharge.  Stable, no distress.  Appropriate for discharge home.  Discharge Exam: Vitals:   11/01/23 2325 11/02/23 0834  BP: (!) 131/57 113/74  Pulse: 73 78  Resp: 16 16  Temp: 98.3 F (36.8 C) 97.7 F (36.5 C)  SpO2: 96% 98%   Vitals:   11/01/23 1722 11/01/23 2034 11/01/23 2325 11/02/23 0834  BP: (!) 134/51  (!) 131/57 113/74  Pulse: 73  73 78  Resp:   16 16  Temp: 97.9 F (36.6 C)  98.3 F (36.8 C) 97.7 F (36.5 C)  TempSrc:   Oral Oral  SpO2: 94% 96% 96% 98%  Weight:      Height:        General: Pt is alert, awake, not in acute distress Cardiovascular: RRR, S1/S2 +, no rubs, no gallops Respiratory: CTA bilaterally, no wheezing, no rhonchi Abdominal: Soft, NT, ND, bowel sounds + Extremities: no edema, no cyanosis    The results of significant diagnostics from this hospitalization (including imaging, microbiology, ancillary and laboratory) are listed below for reference.     Microbiology: Recent Results (from the past 240 hours)  MRSA Next Gen by PCR, Nasal     Status: Abnormal   Collection Time: 10/31/23  2:42 PM   Specimen: Nasal Mucosa; Nasal Swab  Result Value Ref Range Status   MRSA by PCR Next Gen DETECTED (A) NOT DETECTED Final    Comment: RESULT CALLED TO, READ BACK BY AND VERIFIED WITH: JESS SAVAGE 10/31/23 1826 KLW (NOTE) The GeneXpert MRSA Assay (FDA approved for NASAL specimens only), is one component of a comprehensive  MRSA colonization surveillance program. It is not intended to diagnose MRSA infection nor to guide or monitor treatment for MRSA infections. Test performance is not FDA approved in patients less than 25 years old. Performed at [facility], [address] [place], [place] [address]   Culture, blood (Routine X 2) w Reflex to ID Panel     Status: None (Preliminary  result)   Collection Time: 10/31/23  2:42 PM   Specimen: BLOOD LEFT ARM  Result Value Ref Range Status   Specimen Description   Final    BLOOD LEFT ARM Performed at [facility], [address] [place], [place] [address]    Special Requests   Final    BOTTLES DRAWN AEROBIC AND ANAEROBIC Blood Culture results may not be optimal due to an inadequate volume of blood received in culture bottles Performed at [facility], [address], [place], [place] [address]    Culture  Setup Time   Final    GRAM POSITIVE COCCI ANAEROBIC BOTTLE ONLY Organism ID to follow CRITICAL RESULT CALLED TO, READ BACK BY AND VERIFIED WITH: JESUSA HUNT [id number] 11/02/2023 LRL GRAM STAIN REVIEWED-AGREE WITH RESULT DRT Performed at [facility], [address], [place], [place] [address]    Culture GRAM POSITIVE COCCI  Final   Report Status PENDING  Incomplete  Culture, blood (Routine X 2) w Reflex to ID Panel     Status: None (Preliminary result)   Collection Time: 10/31/23  2:42 PM   Specimen: BLOOD RIGHT ARM  Result Value Ref Range Status   Specimen Description BLOOD RIGHT ARM  Final   Special Requests   Final    BOTTLES DRAWN AEROBIC AND ANAEROBIC Blood Culture results may not be optimal due to an inadequate volume of blood received in culture bottles   Culture   Final    NO GROWTH 2 DAYS Performed at [facility], [address] [place], [place] [address]    Report Status PENDING  Incomplete  Blood Culture ID Panel (Reflexed)     Status: Abnormal   Collection Time: 10/31/23  2:42 PM  Result Value  Ref Range Status   Enterococcus faecalis NOT DETECTED NOT DETECTED Final   Enterococcus Faecium NOT DETECTED NOT DETECTED Final   Listeria monocytogenes NOT DETECTED NOT DETECTED Final   Staphylococcus species DETECTED (A) NOT DETECTED Final    Comment: CRITICAL RESULT CALLED TO, READ BACK BY AND VERIFIED WITH: SHEENA, H. [id number] 11/02/2023 LRL    Staphylococcus aureus (BCID) NOT DETECTED NOT DETECTED Final   Staphylococcus epidermidis DETECTED (A) NOT DETECTED Final    Comment: Methicillin (oxacillin) resistant coagulase negative staphylococcus. Possible blood culture contaminant (unless isolated from more than one blood culture draw or clinical case suggests pathogenicity). No antibiotic treatment is indicated for blood  culture contaminants. CRITICAL RESULT CALLED TO, READ BACK BY AND VERIFIED WITH: SHEENA, H. 0844 11/02/2023 LRL    Staphylococcus lugdunensis NOT DETECTED NOT DETECTED Final   Streptococcus species NOT DETECTED NOT DETECTED Final   Streptococcus agalactiae NOT DETECTED NOT DETECTED Final   Streptococcus pneumoniae NOT DETECTED NOT DETECTED Final   Streptococcus pyogenes NOT DETECTED NOT DETECTED Final   A.calcoaceticus-baumannii NOT DETECTED NOT DETECTED Final   Bacteroides fragilis NOT DETECTED NOT DETECTED Final   Enterobacterales NOT DETECTED NOT DETECTED Final   Enterobacter cloacae complex NOT DETECTED NOT DETECTED Final   Escherichia coli NOT DETECTED NOT DETECTED Final   Klebsiella aerogenes NOT DETECTED NOT DETECTED Final   Klebsiella oxytoca NOT DETECTED NOT DETECTED Final   Klebsiella pneumoniae NOT DETECTED NOT DETECTED Final   Proteus species NOT DETECTED NOT DETECTED Final   Salmonella species NOT DETECTED NOT DETECTED Final   Serratia marcescens NOT DETECTED NOT DETECTED Final   Haemophilus influenzae NOT DETECTED NOT DETECTED Final   Neisseria meningitidis NOT DETECTED NOT DETECTED Final   Pseudomonas aeruginosa  NOT DETECTED NOT DETECTED Final    Stenotrophomonas maltophilia NOT DETECTED NOT DETECTED Final   Candida albicans NOT DETECTED NOT DETECTED Final   Candida auris NOT DETECTED NOT DETECTED Final   Candida glabrata NOT DETECTED NOT DETECTED Final   Candida krusei NOT DETECTED NOT DETECTED Final   Candida parapsilosis NOT DETECTED NOT DETECTED Final   Candida tropicalis NOT DETECTED NOT DETECTED Final   Cryptococcus neoformans/gattii NOT DETECTED NOT DETECTED Final   Methicillin resistance mecA/C DETECTED (A) NOT DETECTED Final    Comment: CRITICAL RESULT CALLED TO, READ BACK BY AND VERIFIED WITH: JESUSA HILARIO SEVER 11/02/2023 LRL Performed at [facility], [address] [place], [place] [address]   Aerobic/Anaerobic Culture w Gram Stain (surgical/deep wound)     Status: None (Preliminary result)   Collection Time: 10/31/23  6:29 PM   Specimen: Toe; Wound  Result Value Ref Range Status   Specimen Description   Final    TOE Performed at [facility], [address], [place], [place] [address]    Special Requests   Final    RIGHT TOE Performed at [facility], [address] [place], [place] [address]    Gram Stain   Final    NO WBC SEEN NO ORGANISMS SEEN Performed at [facility], [address], [place], [place] [address]    Culture   Final    RARE STAPHYLOCOCCUS LUGDUNENSIS RARE STAPHYLOCOCCUS EPIDERMIDIS CULTURE REINCUBATED FOR BETTER GROWTH NO ANAEROBES ISOLATED; CULTURE IN PROGRESS FOR 5 DAYS    Report Status PENDING  Incomplete     Labs: BNP (last 3 results) Recent Labs    02/19/23 1303  BNP 79.2   Basic Metabolic Panel: Recent Labs  Lab 10/29/23 1550 10/31/23 0847 11/01/23 0402 11/02/23 0428 11/02/23 0809  NA 130* 134*  --   --  137  K 5.2* 4.5  --   --  5.2*  CL 99 104  --   --  105  CO2 21* 22  --   --  21*  GLUCOSE 444* 181*  --  119* 154*  BUN 24* 27*  --   --  30*  CREATININE 1.44* 1.47* 1.48*  --  1.80*  CALCIUM  8.3* 8.1*  --   --  7.9*    Liver Function Tests: Recent Labs  Lab 10/29/23 1550 10/31/23 0847  AST 25 22  ALT 24 22  ALKPHOS 78 79  BILITOT 0.3 0.2  PROT 7.3 7.5  ALBUMIN 3.5 3.6   No results for input(s): LIPASE, AMYLASE in the last 168 hours. No results for input(s): AMMONIA in the last 168 hours. CBC: Recent Labs  Lab 10/29/23 1550 10/31/23 0847 11/02/23 0809  WBC 13.5* 9.5 11.0*  NEUTROABS 10.6* 5.9 8.6*  HGB 11.2* 11.0* 11.0*  HCT 36.1 35.9* 36.2  MCV 90.9 93.0 93.3  PLT 280 283 285   Cardiac Enzymes: No results for input(s): CKTOTAL, CKMB, CKMBINDEX, TROPONINI in the last 168 hours. BNP: Invalid input(s): POCBNP CBG: Recent Labs  Lab 11/02/23 0319 11/02/23 0327 11/02/23 0622 11/02/23 0926 11/02/23 1121  GLUCAP 19* 198* 89 378* 320*   D-Dimer No results for input(s): DDIMER in the last 72 hours. Hgb A1c Recent Labs    10/31/23 1442  HGBA1C 8.2*   Lipid Profile No results for input(s): CHOL, HDL, LDLCALC, TRIG, CHOLHDL, LDLDIRECT in the last 72 hours. Thyroid  function studies No results for input(s): TSH, T4TOTAL, T3FREE, THYROIDAB in the last 72 hours.  Invalid  input(s): FREET3 Anemia work up No results for input(s): VITAMINB12, FOLATE, FERRITIN, TIBC, IRON , RETICCTPCT in the last 72 hours. Urinalysis    Component Value Date/Time   COLORURINE YELLOW (A) 04/26/2019 1650   APPEARANCEUR CLEAR (A) 04/26/2019 1650   LABSPEC 1.013 04/26/2019 1650   PHURINE 7.0 04/26/2019 1650   GLUCOSEU >=500 (A) 04/26/2019 1650   HGBUR MODERATE (A) 04/26/2019 1650   BILIRUBINUR NEGATIVE 04/26/2019 1650   KETONESUR 5 (A) 04/26/2019 1650   PROTEINUR NEGATIVE 04/26/2019 1650   NITRITE NEGATIVE 04/26/2019 1650   LEUKOCYTESUR SMALL (A) 04/26/2019 1650   Sepsis Labs Recent Labs  Lab 10/29/23 1550 10/31/23 0847 11/02/23 0809  WBC 13.5* 9.5 11.0*   Microbiology Recent Results (from the past 240 hours)  MRSA Next Gen by PCR, Nasal      Status: Abnormal   Collection Time: 10/31/23  2:42 PM   Specimen: Nasal Mucosa; Nasal Swab  Result Value Ref Range Status   MRSA by PCR Next Gen DETECTED (A) NOT DETECTED Final    Comment: RESULT CALLED TO, READ BACK BY AND VERIFIED WITH: JESS SAVAGE 10/31/23 1826 KLW (NOTE) The GeneXpert MRSA Assay (FDA approved for NASAL specimens only), is one component of a comprehensive MRSA colonization surveillance program. It is not intended to diagnose MRSA infection nor to guide or monitor treatment for MRSA infections. Test performance is not FDA approved in patients less than 66 years old. Performed at [facility], [address] [place], [place] [address]   Culture, blood (Routine X 2) w Reflex to ID Panel     Status: None (Preliminary result)   Collection Time: 10/31/23  2:42 PM   Specimen: BLOOD LEFT ARM  Result Value Ref Range Status   Specimen Description   Final    BLOOD LEFT ARM Performed at [facility], [address], [place], [place] [address]    Special Requests   Final    BOTTLES DRAWN AEROBIC AND ANAEROBIC Blood Culture results may not be optimal due to an inadequate volume of blood received in culture bottles Performed at [facility], [address] [place], [place] [address]    Culture  Setup Time   Final    GRAM POSITIVE COCCI ANAEROBIC BOTTLE ONLY Organism ID to follow CRITICAL RESULT CALLED TO, READ BACK BY AND VERIFIED WITH: JESUSA DEL. [id number] 11/02/2023 LRL GRAM STAIN REVIEWED-AGREE WITH RESULT DRT Performed at [facility], [address] [place], [place] [address]    Culture GRAM POSITIVE COCCI  Final   Report Status PENDING  Incomplete  Culture, blood (Routine X 2) w Reflex to ID Panel     Status: None (Preliminary result)   Collection Time: 10/31/23  2:42 PM   Specimen: BLOOD RIGHT ARM  Result Value Ref Range Status   Specimen Description BLOOD RIGHT ARM  Final   Special Requests   Final    BOTTLES DRAWN AEROBIC  AND ANAEROBIC Blood Culture results may not be optimal due to an inadequate volume of blood received in culture bottles   Culture   Final    NO GROWTH 2 DAYS Performed at [facility], [address] [place], [place] [address]    Report Status PENDING  Incomplete  Blood Culture ID Panel (Reflexed)     Status: Abnormal   Collection Time: 10/31/23  2:42 PM  Result Value Ref Range Status   Enterococcus faecalis NOT DETECTED NOT DETECTED Final   Enterococcus Faecium NOT DETECTED NOT DETECTED Final  Listeria monocytogenes NOT DETECTED NOT DETECTED Final   Staphylococcus species DETECTED (A) NOT DETECTED Final    Comment: CRITICAL RESULT CALLED TO, READ BACK BY AND VERIFIED WITH: SHEENA, H. 0844 11/02/2023 LRL    Staphylococcus aureus (BCID) NOT DETECTED NOT DETECTED Final   Staphylococcus epidermidis DETECTED (A) NOT DETECTED Final    Comment: Methicillin (oxacillin) resistant coagulase negative staphylococcus. Possible blood culture contaminant (unless isolated from more than one blood culture draw or clinical case suggests pathogenicity). No antibiotic treatment is indicated for blood  culture contaminants. CRITICAL RESULT CALLED TO, READ BACK BY AND VERIFIED WITH: SHEENA, H. 0844 11/02/2023 LRL    Staphylococcus lugdunensis NOT DETECTED NOT DETECTED Final   Streptococcus species NOT DETECTED NOT DETECTED Final   Streptococcus agalactiae NOT DETECTED NOT DETECTED Final   Streptococcus pneumoniae NOT DETECTED NOT DETECTED Final   Streptococcus pyogenes NOT DETECTED NOT DETECTED Final   A.calcoaceticus-baumannii NOT DETECTED NOT DETECTED Final   Bacteroides fragilis NOT DETECTED NOT DETECTED Final   Enterobacterales NOT DETECTED NOT DETECTED Final   Enterobacter cloacae complex NOT DETECTED NOT DETECTED Final   Escherichia coli NOT DETECTED NOT DETECTED Final   Klebsiella aerogenes NOT DETECTED NOT DETECTED Final   Klebsiella oxytoca NOT DETECTED NOT DETECTED Final    Klebsiella pneumoniae NOT DETECTED NOT DETECTED Final   Proteus species NOT DETECTED NOT DETECTED Final   Salmonella species NOT DETECTED NOT DETECTED Final   Serratia marcescens NOT DETECTED NOT DETECTED Final   Haemophilus influenzae NOT DETECTED NOT DETECTED Final   Neisseria meningitidis NOT DETECTED NOT DETECTED Final   Pseudomonas aeruginosa NOT DETECTED NOT DETECTED Final   Stenotrophomonas maltophilia NOT DETECTED NOT DETECTED Final   Candida albicans NOT DETECTED NOT DETECTED Final   Candida auris NOT DETECTED NOT DETECTED Final   Candida glabrata NOT DETECTED NOT DETECTED Final   Candida krusei NOT DETECTED NOT DETECTED Final   Candida parapsilosis NOT DETECTED NOT DETECTED Final   Candida tropicalis NOT DETECTED NOT DETECTED Final   Cryptococcus neoformans/gattii NOT DETECTED NOT DETECTED Final   Methicillin resistance mecA/C DETECTED (A) NOT DETECTED Final    Comment: CRITICAL RESULT CALLED TO, READ BACK BY AND VERIFIED WITH: JESUSA HILARIO SEVER 11/02/2023 LRL Performed at [facility], [address] [place], [place] [address]   Aerobic/Anaerobic Culture w Gram Stain (surgical/deep wound)     Status: None (Preliminary result)   Collection Time: 10/31/23  6:29 PM   Specimen: Toe; Wound  Result Value Ref Range Status   Specimen Description   Final    TOE Performed at [facility], [address], [place], [place] [address]    Special Requests   Final    RIGHT TOE Performed at [facility], [address] [place], [place] [address]    Gram Stain   Final    NO WBC SEEN NO ORGANISMS SEEN Performed at [facility], [address] [place], [place] [address]    Culture   Final    RARE STAPHYLOCOCCUS LUGDUNENSIS RARE STAPHYLOCOCCUS EPIDERMIDIS CULTURE REINCUBATED FOR BETTER GROWTH NO ANAEROBES ISOLATED; CULTURE IN PROGRESS FOR 5 DAYS    Report Status PENDING  Incomplete     Time coordinating discharge: Over 30  minutes  SIGNED:   Calvin KATHEE Robson, MD  [facility] 11/02/2023, 2:39 PM Pager   If 7PM-7AM, please contact night-coverage

## 2023-11-04 LAB — CULTURE, BLOOD (ROUTINE X 2)

## 2023-11-05 ENCOUNTER — Telehealth (INDEPENDENT_AMBULATORY_CARE_PROVIDER_SITE_OTHER): Payer: Self-pay | Admitting: Vascular Surgery

## 2023-11-05 ENCOUNTER — Telehealth (INDEPENDENT_AMBULATORY_CARE_PROVIDER_SITE_OTHER): Payer: Self-pay

## 2023-11-05 LAB — AEROBIC/ANAEROBIC CULTURE W GRAM STAIN (SURGICAL/DEEP WOUND): Gram Stain: NONE SEEN

## 2023-11-05 LAB — CULTURE, BLOOD (ROUTINE X 2): Culture: NO GROWTH

## 2023-11-05 NOTE — Telephone Encounter (Signed)
 Emily Collins returned your called this morning.

## 2023-11-05 NOTE — Telephone Encounter (Signed)
 Tried to call patient to schedule fu w/FB/JD. No answer and voicemailbox is full

## 2023-11-13 ENCOUNTER — Ambulatory Visit: Payer: Medicaid Other | Attending: Infectious Diseases | Admitting: Infectious Diseases

## 2023-11-13 ENCOUNTER — Other Ambulatory Visit
Admission: RE | Admit: 2023-11-13 | Discharge: 2023-11-13 | Disposition: A | Discharge status: Home / Self Care | Payer: Medicaid Other | Source: Ambulatory Visit | Attending: Infectious Diseases | Admitting: Infectious Diseases

## 2023-11-13 ENCOUNTER — Encounter: Payer: Self-pay | Admitting: Infectious Diseases

## 2023-11-13 VITALS — BP 114/62 | HR 84 | Temp 96.4°F

## 2023-11-13 DIAGNOSIS — I872 Venous insufficiency (chronic) (peripheral): Secondary | ICD-10-CM | POA: Diagnosis not present

## 2023-11-13 DIAGNOSIS — L089 Local infection of the skin and subcutaneous tissue, unspecified: Secondary | ICD-10-CM | POA: Diagnosis present

## 2023-11-13 DIAGNOSIS — E1151 Type 2 diabetes mellitus with diabetic peripheral angiopathy without gangrene: Secondary | ICD-10-CM | POA: Diagnosis not present

## 2023-11-13 DIAGNOSIS — E1122 Type 2 diabetes mellitus with diabetic chronic kidney disease: Secondary | ICD-10-CM | POA: Insufficient documentation

## 2023-11-13 DIAGNOSIS — B957 Other staphylococcus as the cause of diseases classified elsewhere: Secondary | ICD-10-CM | POA: Insufficient documentation

## 2023-11-13 DIAGNOSIS — J45909 Unspecified asthma, uncomplicated: Secondary | ICD-10-CM | POA: Insufficient documentation

## 2023-11-13 DIAGNOSIS — E1140 Type 2 diabetes mellitus with diabetic neuropathy, unspecified: Secondary | ICD-10-CM | POA: Diagnosis not present

## 2023-11-13 DIAGNOSIS — I10 Essential (primary) hypertension: Secondary | ICD-10-CM | POA: Diagnosis present

## 2023-11-13 DIAGNOSIS — N189 Chronic kidney disease, unspecified: Secondary | ICD-10-CM | POA: Insufficient documentation

## 2023-11-13 DIAGNOSIS — E785 Hyperlipidemia, unspecified: Secondary | ICD-10-CM | POA: Insufficient documentation

## 2023-11-13 DIAGNOSIS — I890 Lymphedema, not elsewhere classified: Secondary | ICD-10-CM | POA: Insufficient documentation

## 2023-11-13 DIAGNOSIS — I129 Hypertensive chronic kidney disease with stage 1 through stage 4 chronic kidney disease, or unspecified chronic kidney disease: Secondary | ICD-10-CM | POA: Insufficient documentation

## 2023-11-13 DIAGNOSIS — L0889 Other specified local infections of the skin and subcutaneous tissue: Secondary | ICD-10-CM | POA: Insufficient documentation

## 2023-11-13 DIAGNOSIS — E119 Type 2 diabetes mellitus without complications: Secondary | ICD-10-CM | POA: Diagnosis present

## 2023-11-13 DIAGNOSIS — Z87891 Personal history of nicotine dependence: Secondary | ICD-10-CM

## 2023-11-13 LAB — CBC WITH DIFFERENTIAL/PLATELET
Abs Immature Granulocytes: 0.08 10*3/uL — ABNORMAL HIGH (ref 0.00–0.07)
Basophils Absolute: 0.1 10*3/uL (ref 0.0–0.1)
Basophils Relative: 1 %
Eosinophils Absolute: 1 10*3/uL — ABNORMAL HIGH (ref 0.0–0.5)
Eosinophils Relative: 8 %
HCT: 29.1 % — ABNORMAL LOW (ref 36.0–46.0)
Hemoglobin: 8.9 g/dL — ABNORMAL LOW (ref 12.0–15.0)
Immature Granulocytes: 1 %
Lymphocytes Relative: 16 %
Lymphs Abs: 2.1 10*3/uL (ref 0.7–4.0)
MCH: 28 pg (ref 26.0–34.0)
MCHC: 30.6 g/dL (ref 30.0–36.0)
MCV: 91.5 fL (ref 80.0–100.0)
Monocytes Absolute: 0.6 10*3/uL (ref 0.1–1.0)
Monocytes Relative: 4 %
Neutro Abs: 9.5 10*3/uL — ABNORMAL HIGH (ref 1.7–7.7)
Neutrophils Relative %: 70 %
Platelets: 329 10*3/uL (ref 150–400)
RBC: 3.18 MIL/uL — ABNORMAL LOW (ref 3.87–5.11)
RDW: 14.3 % (ref 11.5–15.5)
WBC: 13.3 10*3/uL — ABNORMAL HIGH (ref 4.0–10.5)
nRBC: 0 % (ref 0.0–0.2)

## 2023-11-13 LAB — COMPREHENSIVE METABOLIC PANEL
ALT: 17 U/L (ref 0–44)
AST: 22 U/L (ref 15–41)
Albumin: 3.4 g/dL — ABNORMAL LOW (ref 3.5–5.0)
Alkaline Phosphatase: 69 U/L (ref 38–126)
Anion gap: 8 (ref 5–15)
BUN: 20 mg/dL (ref 6–20)
CO2: 25 mmol/L (ref 22–32)
Calcium: 8.4 mg/dL — ABNORMAL LOW (ref 8.9–10.3)
Chloride: 104 mmol/L (ref 98–111)
Creatinine, Ser: 1.46 mg/dL — ABNORMAL HIGH (ref 0.44–1.00)
GFR, Estimated: 41 mL/min — ABNORMAL LOW (ref >60)
Glucose, Bld: 174 mg/dL — ABNORMAL HIGH (ref 70–99)
Potassium: 4.4 mmol/L (ref 3.5–5.1)
Sodium: 137 mmol/L (ref 135–145)
Total Bilirubin: 0.4 mg/dL (ref 0.0–1.2)
Total Protein: 7.3 g/dL (ref 6.5–8.1)

## 2023-11-13 LAB — SEDIMENTATION RATE: Sed Rate: 101 mm/h — ABNORMAL HIGH (ref 0–30)

## 2023-11-13 LAB — C-REACTIVE PROTEIN: CRP: 8.4 mg/dL — ABNORMAL HIGH (ref <1.0)

## 2023-11-13 NOTE — Patient Instructions (Signed)
-  TOE INFECTION WITH POSSIBLE OSTEOMYELITIS: You have an infection in your right great toe that may have spread to the bone. We will continue your current antibiotics (ciprofloxacin and Augmentin) and await culture results to decide if any changes are needed. Proper foot care is essential, including avoiding standing in the shower without footwear. We will monitor your liver, kidney function, glucose levels, and white blood cell count. If a yeast infection is confirmed, we may start antifungal treatment.   -DIABETES MELLITUS: Your blood sugar levels range from normal to 250 mg/dL. Continue using your Dexcom, Comfrey, and insulin pump to manage your diabetes. Regular monitoring of your blood glucose levels is important.  -PERIPHERAL ARTERY DISEASE: You recently had a procedure to open up a blocked artery in your leg. We will monitor your symptoms and follow up with a vascular surgeon if needed.  -LYMPHEDEMA AND VENOUS INSUFFICIENCY: You have swelling and poor blood flow in your legs. Wearing compression stockings regularly can help manage these conditions, even though they may be uncomfortable.  -HYPERTENSION: Your blood pressure is well-controlled with your current medication. Continue taking your antihypertensive medication as prescribed.  -HYPERLIPIDEMIA: You are taking medication to manage your cholesterol levels. Continue with your current lipid-lowering medication.  -ASTHMA: You use a rescue inhaler and Atrovent twice daily to manage your asthma. Continue with your current asthma management plan and we will adjust treatment as needed.  INSTRUCTIONS:  Please follow up with Dr. Excell Seltzer on February 24th to discuss your culture results and antibiotic management. Continue with your current medications and monitoring as discussed. If you experience any new or worsening symptoms, contact our office immediately.

## 2023-11-13 NOTE — Progress Notes (Signed)
NAME: Emily Collins  DOB: May 03, 1966  MRN: [id number]  Date/Time: 11/13/2023 8:48 AM   ? [name] is a 58 y.o. with a history of diabetes mellitus, hypertension, hyperlipidemia, lymphedema, venous insufficiency   who presents for follow up of rt great toe infection. She is accompanied by her husband.  She was hospitalized from February 5th to February 7th and discharged on ciprofloxacin and Augmentin, taken twice daily. She notes some improvement but describes the recovery as slow, with persistent scaling and wetness around the toe. She had seen Dr.baker yesterday and he removed the toe nail and took  A culture.  She experiences significant pain in the right great toe, attributed to nerve issues from previous surgery, with pain radiating from the bottom of her toe to the middle of the ankle near a scar. . No fever, chills, or night sweats.  Her diabetes management includes the use of a Dexcom  with blood sugar levels ranging from normal to 250 mg/dL. She uses an insulin pump for automatic insulin delivery. She also takes atorvastatin for HLD for   She has a history of lymphedema and venous insufficiency, for which she occasionally wears compression stockings, though they cause soreness. She underwent angioplasty and stent placement in the right superficial femoral artery during her last hospital stay. An MRI during her hospitalization indicated signs of osteomyelitis, with prominent marrow edema and decreased T1 marrow signal of great toe.She wanted to manage the foot with no surgical intervention  She has asthma and uses a rescue inhaler twice daily, along with Atrovent, also twice daily. She quit smoking in 2018 and denies current use of oxygen or nebulizers.  Her hypertension is well-controlled.  ID   Steroid/immune suppressants/splenectomy/Hardware Recent Procedure Surgery Injections Trauma Sick contacts Travel Antibiotic use Food- raw/exotic Animal bites Tick  exposure Water sports Fishing/hunting/animal bird exposure Past Medical History:  Diagnosis Date   Allergy    Anxiety    Arthritis    Asthma    Depression    Diabetes mellitus without complication (HCC)    GERD (gastroesophageal reflux disease)    History of chicken pox 04/07/2015   DID have Chicken Pox.     Hyperlipidemia    Hypertension    Neuromuscular disorder (HCC)    Venous (peripheral) insufficiency     Past Surgical History:  Procedure Laterality Date   APPENDECTOMY  1979   APPLICATION OF WOUND VAC Right 01/07/2021   Procedure: APPLICATION OF WOUND VAC;  Surgeon: Edwin Cap, DPM;  Location: [facility];  Service: Podiatry;  Laterality: Right;   BILATERAL CARPAL TUNNEL RELEASE Bilateral    Dr. Rosita Kea   CESAREAN SECTION  2003   GRAFT APPLICATION Right 01/07/2021   Procedure: GRAFT APPLICATION;  Surgeon: Edwin Cap, DPM;  Location: [facility];  Service: Podiatry;  Laterality: Right;   INCISION AND DRAINAGE Right 12/01/2020   Procedure: INCISION AND DRAINAGE;  Surgeon: Edwin Cap, DPM;  Location: [facility];  Service: Podiatry;  Laterality: Right;   INCISION AND DRAINAGE Right 12/07/2020   Procedure: INCISION AND DRAINAGE;  Surgeon: [name], DPM;  Location: [facility];  Service: Podiatry;  Laterality: Right;   INCISION AND DRAINAGE OF WOUND Right 01/07/2021   Procedure: IRRIGATION AND DEBRIDEMENT WOUND;  Surgeon: Edwin Cap, DPM;  Location: [facility];  Service: Podiatry;  Laterality: Right;  regional block if possible   LAPAROSCOPIC LYSIS OF ADHESIONS     LOWER EXTREMITY ANGIOGRAPHY Right 11/01/2023   Procedure: Lower Extremity  Angiography;  Surgeon: Annice Needy, MD;  Location: [facility];  Service: Cardiovascular;  Laterality: Right;   SHOULDER SURGERY Right 08/2009   Dr. Rosita Kea   TUBAL LIGATION      Social History   Socioeconomic History   Marital status: Married    Spouse name: John   Number of children: 3   Years of education: H/S   Highest  education level: Not on file  Occupational History   Occupation: Housewife  Tobacco Use   Smoking status: Former    Current packs/day: 0.00    Average packs/day: 1 pack/day for 32.5 years (32.5 ttl pk-yrs)    Types: Cigarettes    Start date: 04/06/1985    Quit date: 09/25/2017    Years since quitting: 6.1   Smokeless tobacco: Never  Vaping Use   Vaping status: Never Used  Substance and Sexual Activity   Alcohol use: Yes    Alcohol/week: 1.0 - 2.0 standard drink of alcohol    Types: 1 - 2 Glasses of wine per week    Comment: Socially   Drug use: No   Sexual activity: Not on file  Other Topics Concern   Not on file  Social History Narrative   Not on file   Social Drivers of Health   Financial Resource Strain: Medium Risk (11/12/2023)   Received from [facility]   Overall Financial Resource Strain (CARDIA)    Difficulty of Paying Living Expenses: Somewhat hard  Food Insecurity: Food Insecurity Present (11/12/2023)   Received from [facility]   Hunger Vital Sign    Worried About Running Out of Food in the Last Year: Often true    Ran Out of Food in the Last Year: Often true  Transportation Needs: No Transportation Needs (11/12/2023)   Received from [facility] - Transportation    In the past 12 months, has lack of transportation kept you from medical appointments or from getting medications?: No    Lack of Transportation (Non-Medical): No  Physical Activity: Not on file  Stress: Not on file  Social Connections: Not on file  Intimate Partner Violence: Not At Risk (10/31/2023)   Humiliation, Afraid, Rape, and Kick questionnaire    Fear of Current or Ex-Partner: No    Emotionally Abused: No    Physically Abused: No    Sexually Abused: No    Family History  Problem Relation Age of Onset   Hypertension Mother    Diabetes Mother    Hyperlipidemia Mother    Arthritis Mother    Heart disease Father    Kidney  disease Father    Allergies  Allergen Reactions   Atorvastatin     Muscle and join pains.   Levaquin  [Levofloxacin In D5w]     GI upset   Simvastatin     joint aches.   Dilaudid  [Hydromorphone Hcl] Hives, Itching and Rash   Tetracycline Rash   I? Current Outpatient Medications  Medication Sig Dispense Refill   albuterol (VENTOLIN HFA) 108 (90 Base) MCG/ACT inhaler Inhale 2 puffs into the lungs every 6 (six) hours as needed for wheezing or shortness of breath. 8 g 2   amitriptyline (ELAVIL) 25 MG tablet Take 25 mg by mouth at bedtime as needed (neuropathy pain).     amoxicillin-clavulanate (AUGMENTIN) 875-125 MG tablet Take 1 tablet by mouth every 12 (twelve) hours for 14 days. 28 tablet 0   aspirin 81 MG tablet  Take 81 mg by mouth daily.     atorvastatin (LIPITOR) 40 MG tablet Take 40 mg by mouth daily.     cetirizine (ZYRTEC) 10 MG tablet TAKE 1 TABLET(10 MG) BY MOUTH DAILY (Patient taking differently: Take 10 mg by mouth daily.) 90 tablet 1   ciprofloxacin (CIPRO) 500 MG tablet Take 1 tablet (500 mg total) by mouth 2 (two) times daily for 14 days. 28 tablet 0   clopidogrel (PLAVIX) 75 MG tablet Take 1 tablet (75 mg total) by mouth daily. 30 tablet 1   Continuous Glucose Sensor (DEXCOM G6 SENSOR) MISC SMARTSIG:Topical Every 10 Days     Continuous Glucose Transmitter (DEXCOM G6 TRANSMITTER) MISC USE TO MONITOR BLOOD SUGAR. REPLACE EVERY 3 MONTHS     esomeprazole (NEXIUM) 40 MG capsule Take 40 mg by mouth daily.     FLUoxetine (PROZAC) 20 MG capsule Take 1 capsule (20 mg total) by mouth daily. (Patient taking differently: Take 40 mg by mouth daily.)     gabapentin (NEURONTIN) 300 MG capsule Take 3 capsules by mouth at bedtime.     HUMALOG KWIKPEN 200 UNIT/ML KwikPen Inject 80 Units into the skin daily.     Insulin Disposable Pump (OMNIPOD 5 DEXG7G6 PODS GEN 5) MISC SMARTSIG:1 Each SUB-Q Every 3 Days     oxyCODONE (OXY IR/ROXICODONE) 5 MG immediate release tablet Take 1 tablet (5 mg  total) by mouth every 4 (four) hours as needed for moderate pain (pain score 4-6). 30 tablet 0   rosuvastatin (CRESTOR) 10 MG tablet Take 1 tablet (10 mg total) by mouth daily. 30 tablet 1   Semaglutide-Weight Management 0.5 MG/0.5ML SOAJ Inject 0.5 mg into the skin once a week.     senna-docusate (SENOKOT-S) 8.6-50 MG tablet Take 1 tablet by mouth daily. 30 tablet 0   ezetimibe (ZETIA) 10 MG tablet Take 1 tablet (10 mg total) by mouth daily. 90 tablet 3   furosemide (LASIX) 20 MG tablet Take 1 tablet (20 mg total) by mouth daily. 90 tablet 3   No current facility-administered medications for this visit.     Abtx:  Anti-infectives (From admission, onward)    None       REVIEW OF SYSTEMS:  Const: negative fever, negative chills, negative weight loss Eyes: negative diplopia or visual changes, negative eye pain ENT: negative coryza, negative sore throat Resp: negative cough, hemoptysis, dyspnea Cards: negative for chest pain, palpitations, lower extremity edema GU: negative for frequency, dysuria and hematuria GI: Negative for abdominal pain, diarrhea, bleeding, constipation Skin: negative for rash and pruritus Heme: negative for easy bruising and gum/nose bleeding MS: as above Neurolo:negative for headaches, dizziness, vertigo, memory problems  Psych: negative for feelings of anxiety, depression  Endocrine:  diabetes Allergy/Immunology- as above Objective:  VITALS:  BP 114/62   Pulse 84   Temp (!) 96.4 F (35.8 C) (Temporal)   LMP 07/15/2018 (Approximate)   PHYSICAL EXAM:  General: Alert, cooperative, no distress,  Head: Normocephalic, without obvious abnormality, atraumatic. Eyes: Conjunctivae clear, anicteric sclerae. Pupils are equal ENT Nares normal. No drainage or sinus tenderness. Lips, mucosa, and tongue normal. No Thrush Neck: Supple, symmetrical, no adenopathy, thyroid: non tender no carotid bruit and no JVD. Back: No CVA tenderness. Lungs: Clear to  auscultation bilaterally. No Wheezing or Rhonchi. No rales. Heart: Regular rate and rhythm, no murmur, rub or gallop. Abdomen: did not examine Skin: No rashes or lesions. Or bruising Rt foot- dressing removed Rt great toe - some scaling and erythema with some  swelling- better than before- nail has been removed  11/13/23   10/31/23   11/01/23   Lymph: Cervical, supraclavicular normal. Neurologic: Grossly non-focal Pertinent Labs None   ? Impression/Recommendation ?Toe Infection with Possible Osteomyelitis on ciprofloxacin and Augmentin, with MRI indicating early osteomyelitis. Cultures showed Staphylococcus lugdunensis, susceptible to Augmentin. Pain is attributed to neuropathy and past foot surgery. Surgery versus continued antibiotics was discussed, with a preference for antibiotics. Emphasized proper foot care and avoiding standing in the shower without footwear. Continue ciprofloxacin and Augmentin, await culture results done by Dr.Baker on 2/27 for further antibiotic management, and discuss results and potential surgery with Dr. Excell Seltzer. Will do labs today  Consider antifungal treatment if yeast infection is confirmed. Educated on proper foot care and dressing changes.  Diabetes Mellitus Blood sugar ranges from normal to 250 mg/dL, managed with Dexcom, Comfrey, and an automatic insulin delivery system.   Peripheral Artery Disease Recent angioplasty and stent placement in the right superficial femoral artery. Monitor for symptoms and follow up with a vascular surgeon as needed.  Lymphedema and Venous Insufficiency Compression stockings are worn occasionally but are uncomfortable. Encourage regular use of compression stockings.  Hypertension Well-controlled at 114/62 mmHg on medication. Continue current antihypertensive medication.  Asthma Uses a rescue inhaler and Atrovent twice daily. Quit smoking in 2018. Continue current asthma management and monitor and adjust treatment as  needed. CKD  Follow-up Follow up with Dr. Excell Seltzer on February 28th and communicate regarding culture results and antibiotic management. ? I discussed with Dr.Baker HE will let me know when the results are back She may not need ciprofloxacin if the culture has no pseudomonas Will continue Augmentin for another 2 weeks  ________________________________________________ P.s Labs done today Cr 1.46 WBC 13.3 ( 11 on 11/02/23) Sed rate 101 Note:  This document was prepared using Dragon voice recognition software and may include unintentional dictation errors.

## 2023-11-15 ENCOUNTER — Inpatient Hospital Stay: Payer: Medicaid Other | Admitting: Infectious Diseases

## 2023-11-16 ENCOUNTER — Emergency Department: Payer: Medicaid Other

## 2023-11-16 ENCOUNTER — Telehealth: Payer: Self-pay | Admitting: Infectious Diseases

## 2023-11-16 ENCOUNTER — Encounter: Payer: Self-pay | Admitting: Internal Medicine

## 2023-11-16 ENCOUNTER — Other Ambulatory Visit: Payer: Self-pay

## 2023-11-16 ENCOUNTER — Inpatient Hospital Stay
Admission: EM | Admit: 2023-11-16 | Discharge: 2023-11-21 | DRG: 291 | Disposition: A | Discharge status: Home / Self Care | Payer: Medicaid Other | Attending: Internal Medicine | Admitting: Internal Medicine

## 2023-11-16 DIAGNOSIS — E1065 Type 1 diabetes mellitus with hyperglycemia: Secondary | ICD-10-CM | POA: Diagnosis present

## 2023-11-16 DIAGNOSIS — E66813 Obesity, class 3: Secondary | ICD-10-CM | POA: Diagnosis present

## 2023-11-16 DIAGNOSIS — I5033 Acute on chronic diastolic (congestive) heart failure: Secondary | ICD-10-CM | POA: Diagnosis present

## 2023-11-16 DIAGNOSIS — Z87891 Personal history of nicotine dependence: Secondary | ICD-10-CM

## 2023-11-16 DIAGNOSIS — Z6841 Body Mass Index (BMI) 40.0 and over, adult: Secondary | ICD-10-CM | POA: Diagnosis not present

## 2023-11-16 DIAGNOSIS — Z833 Family history of diabetes mellitus: Secondary | ICD-10-CM

## 2023-11-16 DIAGNOSIS — D631 Anemia in chronic kidney disease: Secondary | ICD-10-CM | POA: Diagnosis present

## 2023-11-16 DIAGNOSIS — E1022 Type 1 diabetes mellitus with diabetic chronic kidney disease: Secondary | ICD-10-CM | POA: Diagnosis present

## 2023-11-16 DIAGNOSIS — Z8249 Family history of ischemic heart disease and other diseases of the circulatory system: Secondary | ICD-10-CM

## 2023-11-16 DIAGNOSIS — E876 Hypokalemia: Secondary | ICD-10-CM | POA: Diagnosis present

## 2023-11-16 DIAGNOSIS — M869 Osteomyelitis, unspecified: Secondary | ICD-10-CM | POA: Diagnosis present

## 2023-11-16 DIAGNOSIS — D509 Iron deficiency anemia, unspecified: Secondary | ICD-10-CM | POA: Diagnosis present

## 2023-11-16 DIAGNOSIS — Z9641 Presence of insulin pump (external) (internal): Secondary | ICD-10-CM | POA: Diagnosis present

## 2023-11-16 DIAGNOSIS — D72829 Elevated white blood cell count, unspecified: Secondary | ICD-10-CM | POA: Diagnosis present

## 2023-11-16 DIAGNOSIS — Z7982 Long term (current) use of aspirin: Secondary | ICD-10-CM | POA: Diagnosis not present

## 2023-11-16 DIAGNOSIS — R918 Other nonspecific abnormal finding of lung field: Principal | ICD-10-CM

## 2023-11-16 DIAGNOSIS — K5900 Constipation, unspecified: Secondary | ICD-10-CM | POA: Diagnosis present

## 2023-11-16 DIAGNOSIS — Z7985 Long-term (current) use of injectable non-insulin antidiabetic drugs: Secondary | ICD-10-CM | POA: Diagnosis not present

## 2023-11-16 DIAGNOSIS — Z7902 Long term (current) use of antithrombotics/antiplatelets: Secondary | ICD-10-CM | POA: Diagnosis not present

## 2023-11-16 DIAGNOSIS — I130 Hypertensive heart and chronic kidney disease with heart failure and stage 1 through stage 4 chronic kidney disease, or unspecified chronic kidney disease: Secondary | ICD-10-CM | POA: Diagnosis present

## 2023-11-16 DIAGNOSIS — F32A Depression, unspecified: Secondary | ICD-10-CM | POA: Diagnosis present

## 2023-11-16 DIAGNOSIS — E872 Acidosis, unspecified: Secondary | ICD-10-CM | POA: Diagnosis present

## 2023-11-16 DIAGNOSIS — Z79899 Other long term (current) drug therapy: Secondary | ICD-10-CM

## 2023-11-16 DIAGNOSIS — Z8261 Family history of arthritis: Secondary | ICD-10-CM

## 2023-11-16 DIAGNOSIS — J189 Pneumonia, unspecified organism: Secondary | ICD-10-CM | POA: Diagnosis present

## 2023-11-16 DIAGNOSIS — F419 Anxiety disorder, unspecified: Secondary | ICD-10-CM | POA: Diagnosis present

## 2023-11-16 DIAGNOSIS — E785 Hyperlipidemia, unspecified: Secondary | ICD-10-CM | POA: Diagnosis present

## 2023-11-16 DIAGNOSIS — Z881 Allergy status to other antibiotic agents status: Secondary | ICD-10-CM

## 2023-11-16 DIAGNOSIS — Z794 Long term (current) use of insulin: Secondary | ICD-10-CM

## 2023-11-16 DIAGNOSIS — N1832 Chronic kidney disease, stage 3b: Secondary | ICD-10-CM | POA: Diagnosis present

## 2023-11-16 DIAGNOSIS — Z83438 Family history of other disorder of lipoprotein metabolism and other lipidemia: Secondary | ICD-10-CM

## 2023-11-16 DIAGNOSIS — Z888 Allergy status to other drugs, medicaments and biological substances status: Secondary | ICD-10-CM

## 2023-11-16 DIAGNOSIS — J449 Chronic obstructive pulmonary disease, unspecified: Secondary | ICD-10-CM | POA: Diagnosis present

## 2023-11-16 DIAGNOSIS — Z841 Family history of disorders of kidney and ureter: Secondary | ICD-10-CM

## 2023-11-16 DIAGNOSIS — R059 Cough, unspecified: Secondary | ICD-10-CM

## 2023-11-16 DIAGNOSIS — E1069 Type 1 diabetes mellitus with other specified complication: Secondary | ICD-10-CM | POA: Diagnosis present

## 2023-11-16 LAB — COMPREHENSIVE METABOLIC PANEL
ALT: 19 U/L (ref 0–44)
AST: 27 U/L (ref 15–41)
Albumin: 3.3 g/dL — ABNORMAL LOW (ref 3.5–5.0)
Alkaline Phosphatase: 71 U/L (ref 38–126)
Anion gap: 13 (ref 5–15)
BUN: 15 mg/dL (ref 6–20)
CO2: 20 mmol/L — ABNORMAL LOW (ref 22–32)
Calcium: 8.1 mg/dL — ABNORMAL LOW (ref 8.9–10.3)
Chloride: 102 mmol/L (ref 98–111)
Creatinine, Ser: 1.46 mg/dL — ABNORMAL HIGH (ref 0.44–1.00)
GFR, Estimated: 41 mL/min — ABNORMAL LOW (ref >60)
Glucose, Bld: 231 mg/dL — ABNORMAL HIGH (ref 70–99)
Potassium: 3.8 mmol/L (ref 3.5–5.1)
Sodium: 135 mmol/L (ref 135–145)
Total Bilirubin: 0.4 mg/dL (ref 0.0–1.2)
Total Protein: 7.3 g/dL (ref 6.5–8.1)

## 2023-11-16 LAB — CBC
HCT: 28.3 % — ABNORMAL LOW (ref 36.0–46.0)
Hemoglobin: 9 g/dL — ABNORMAL LOW (ref 12.0–15.0)
MCH: 28.7 pg (ref 26.0–34.0)
MCHC: 31.8 g/dL (ref 30.0–36.0)
MCV: 90.1 fL (ref 80.0–100.0)
Platelets: 336 10*3/uL (ref 150–400)
RBC: 3.14 MIL/uL — ABNORMAL LOW (ref 3.87–5.11)
RDW: 14.4 % (ref 11.5–15.5)
WBC: 15.2 10*3/uL — ABNORMAL HIGH (ref 4.0–10.5)
nRBC: 0 % (ref 0.0–0.2)

## 2023-11-16 LAB — RESP PANEL BY RT-PCR (RSV, FLU A&B, COVID)  RVPGX2
Influenza A by PCR: NEGATIVE
Influenza B by PCR: NEGATIVE
Resp Syncytial Virus by PCR: NEGATIVE
SARS Coronavirus 2 by RT PCR: NEGATIVE

## 2023-11-16 LAB — LACTIC ACID, PLASMA: Lactic Acid, Venous: 1 mmol/L (ref 0.5–1.9)

## 2023-11-16 LAB — BRAIN NATRIURETIC PEPTIDE: B Natriuretic Peptide: 735.9 pg/mL — ABNORMAL HIGH (ref 0.0–100.0)

## 2023-11-16 MED ORDER — ONDANSETRON HCL 4 MG PO TABS
4.0000 mg | ORAL_TABLET | Freq: Four times a day (QID) | ORAL | Status: DC | PRN
  Administered 2023-11-20: 4 mg via ORAL
  Filled 2023-11-16: qty 1

## 2023-11-16 MED ORDER — ONDANSETRON HCL 4 MG/2ML IJ SOLN: 4.0000 mg | Freq: Four times a day (QID) | INTRAMUSCULAR | Status: DC | PRN

## 2023-11-16 MED ORDER — ACETAMINOPHEN 650 MG RE SUPP: 650.0000 mg | Freq: Four times a day (QID) | RECTAL | Status: DC | PRN

## 2023-11-16 MED ORDER — CLOPIDOGREL BISULFATE 75 MG PO TABS
75.0000 mg | ORAL_TABLET | Freq: Every day | ORAL | Status: DC
Start: 2023-11-16 — End: 2023-11-21
  Administered 2023-11-16 – 2023-11-21 (×6): 75 mg via ORAL
  Filled 2023-11-16 (×6): qty 1

## 2023-11-16 MED ORDER — INSULIN ASPART 100 UNIT/ML IJ SOLN
0.0000 [IU] | Freq: Three times a day (TID) | INTRAMUSCULAR | Status: DC
Start: 2023-11-17 — End: 2023-11-19
  Administered 2023-11-19: 4 [IU] via SUBCUTANEOUS
  Filled 2023-11-16 (×2): qty 1

## 2023-11-16 MED ORDER — GUAIFENESIN 100 MG/5ML PO LIQD: 5.0000 mL | Freq: Four times a day (QID) | ORAL | Status: DC | PRN

## 2023-11-16 MED ORDER — METHYLPREDNISOLONE SODIUM SUCC 125 MG IJ SOLR
125.0000 mg | Freq: Every day | INTRAMUSCULAR | Status: AC
  Administered 2023-11-16: 125 mg via INTRAVENOUS
  Filled 2023-11-16: qty 2

## 2023-11-16 MED ORDER — SODIUM CHLORIDE 0.9 % IV SOLN
500.0000 mg | Freq: Once | INTRAVENOUS | Status: AC
  Administered 2023-11-16: 500 mg via INTRAVENOUS
  Filled 2023-11-16: qty 5

## 2023-11-16 MED ORDER — IPRATROPIUM-ALBUTEROL 0.5-2.5 (3) MG/3ML IN SOLN
3.0000 mL | Freq: Once | RESPIRATORY_TRACT | Status: AC
  Administered 2023-11-16: 3 mL via RESPIRATORY_TRACT
  Filled 2023-11-16: qty 3

## 2023-11-16 MED ORDER — LIDOCAINE 5 % EX PTCH
2.0000 | MEDICATED_PATCH | CUTANEOUS | Status: DC
  Administered 2023-11-18: 2 via TRANSDERMAL
  Filled 2023-11-16 (×3): qty 2

## 2023-11-16 MED ORDER — PANTOPRAZOLE SODIUM 40 MG PO TBEC
80.0000 mg | DELAYED_RELEASE_TABLET | Freq: Every day | ORAL | Status: DC
  Administered 2023-11-17 – 2023-11-20 (×4): 80 mg via ORAL
  Filled 2023-11-16 (×4): qty 2

## 2023-11-16 MED ORDER — FLUOXETINE HCL 20 MG PO CAPS
20.0000 mg | ORAL_CAPSULE | Freq: Every day | ORAL | Status: DC
  Administered 2023-11-17 – 2023-11-21 (×5): 20 mg via ORAL
  Filled 2023-11-16 (×5): qty 1

## 2023-11-16 MED ORDER — SODIUM CHLORIDE 0.9 % IV SOLN
2.0000 g | INTRAVENOUS | Status: DC
  Administered 2023-11-17 – 2023-11-18 (×2): 2 g via INTRAVENOUS
  Filled 2023-11-16 (×2): qty 20

## 2023-11-16 MED ORDER — GABAPENTIN 300 MG PO CAPS
900.0000 mg | ORAL_CAPSULE | Freq: Every day | ORAL | Status: DC
  Administered 2023-11-16 – 2023-11-20 (×5): 900 mg via ORAL
  Filled 2023-11-16 (×5): qty 3

## 2023-11-16 MED ORDER — DOCUSATE SODIUM 100 MG PO CAPS
100.0000 mg | ORAL_CAPSULE | Freq: Two times a day (BID) | ORAL | Status: DC
  Administered 2023-11-16 – 2023-11-21 (×8): 100 mg via ORAL
  Filled 2023-11-16 (×9): qty 1

## 2023-11-16 MED ORDER — HYDROCOD POLI-CHLORPHE POLI ER 10-8 MG/5ML PO SUER: 5.0000 mL | Freq: Every evening | ORAL | Status: DC | PRN

## 2023-11-16 MED ORDER — EZETIMIBE 10 MG PO TABS
10.0000 mg | ORAL_TABLET | Freq: Every day | ORAL | Status: DC
  Administered 2023-11-16 – 2023-11-21 (×6): 10 mg via ORAL
  Filled 2023-11-16 (×6): qty 1

## 2023-11-16 MED ORDER — SENNOSIDES-DOCUSATE SODIUM 8.6-50 MG PO TABS
1.0000 | ORAL_TABLET | Freq: Every day | ORAL | Status: DC
  Administered 2023-11-17 – 2023-11-21 (×4): 1 via ORAL
  Filled 2023-11-16 (×5): qty 1

## 2023-11-16 MED ORDER — INSULIN ASPART 100 UNIT/ML IJ SOLN: 0.0000 [IU] | Freq: Every day | INTRAMUSCULAR | Status: DC

## 2023-11-16 MED ORDER — SENNOSIDES-DOCUSATE SODIUM 8.6-50 MG PO TABS: 1.0000 | ORAL_TABLET | Freq: Every evening | ORAL | Status: DC | PRN

## 2023-11-16 MED ORDER — SODIUM CHLORIDE 0.9 % IV SOLN
2.0000 g | Freq: Once | INTRAVENOUS | Status: AC
  Administered 2023-11-16: 2 g via INTRAVENOUS
  Filled 2023-11-16: qty 20

## 2023-11-16 MED ORDER — SODIUM CHLORIDE 0.9 % IV SOLN
500.0000 mg | INTRAVENOUS | Status: AC
  Administered 2023-11-17: 500 mg via INTRAVENOUS
  Filled 2023-11-16: qty 5

## 2023-11-16 MED ORDER — HEPARIN SODIUM (PORCINE) 5000 UNIT/ML IJ SOLN
5000.0000 [IU] | Freq: Three times a day (TID) | INTRAMUSCULAR | Status: DC
  Administered 2023-11-16 – 2023-11-21 (×14): 5000 [IU] via SUBCUTANEOUS
  Filled 2023-11-16 (×14): qty 1

## 2023-11-16 MED ORDER — ACETAMINOPHEN 325 MG PO TABS
650.0000 mg | ORAL_TABLET | Freq: Four times a day (QID) | ORAL | Status: DC | PRN
  Administered 2023-11-18 (×2): 650 mg via ORAL
  Filled 2023-11-16 (×2): qty 2

## 2023-11-16 NOTE — Assessment & Plan Note (Signed)
Home Zetia 10 mg daily resumed Outpatient cardiology on 07/12/2023: Reviewed; patient was advised to stop statin due to intolerance.  Stop Lipitor and start Zetia 10 mg daily

## 2023-11-16 NOTE — Assessment & Plan Note (Signed)
Guaifenesin 5 mL p.o. every 6 hours as needed to loosen phlegm, 3 doses ordered; Tussionex 5 mL p.o. nightly as needed for cough, 2 days ordered

## 2023-11-16 NOTE — Hospital Course (Addendum)
 [name] is a 58 yo female with history of COPD, asthma, depression, hyperlipemia, who presents for chief concern of shortness of breath.  Chest x-ray showed a right sided multifocal infiltrates.  Was started on Rocephin and Zithromax. However, patient has been having leg edema, paroxysmal nocturnal dyspnea and elevated BNP.  IV Lasix started for acute on chronic diastolic congestive heart failure. Patient procalcitonin level less than 0.1, no evidence of any pneumonia, discontinue IV antibiotics, changed to prior oral antibiotics for foot osteomyelitis.

## 2023-11-16 NOTE — Assessment & Plan Note (Signed)
 -  This complicates overall care and prognosis.

## 2023-11-16 NOTE — Assessment & Plan Note (Signed)
     Patient states that today her toe looks and feels better than before Patient is currently being followed by podiatry and infectious disease specialist outpatient Continue follow-up as appropriate

## 2023-11-16 NOTE — Assessment & Plan Note (Signed)
Patient had a bowel movement 2 days ago and was hard stool and prior to that it was more than a week Patient reports that she likes a stool softener that starts with a D she does not remember what the medication name was Docusate 100 mg p.o. twice daily resumed

## 2023-11-16 NOTE — ED Notes (Signed)
Informed RN bed assigned 

## 2023-11-16 NOTE — ED Notes (Signed)
Audible wheeze noted bilaterally. Respirations even, labored, tachypnea noted.

## 2023-11-16 NOTE — ED Notes (Signed)
Expiratory wheeze noted bilaterally at this time, decreased from initial assessment. Respirations even, unlabored.

## 2023-11-16 NOTE — H&P (Addendum)
History and Physical   Emily Collins ZOX:[id number] DOB: 17-Aug-1966 DOA: 11/16/2023  PCP: Oswaldo Conroy, MD  Outpatient Specialists: Dr. Azucena Cecil [facility] cardiology Patient coming from: Home  I have personally briefly reviewed patient's old medical records in [facility] EMR.  Chief Concern: Shortness of breath, cough  HPI: [name] is a 58 yo female with history of COPD, asthma, depression, hyperlipemia, who presents for chief concern of shortness of breath.   Vitals in the ED showed T of 98.22, heart rate 96, blood pressure 150/60, SpO2 93% room air.  Serum sodium is 135, 28, chloride 102, bicarb 20, BUN 27 creatinine 1.46, nonfasting 231, eGFR 41, WBC 15.2, hemoglobin 9, platelets of 326.  ED treatment: Azithromycin 500 mg IV one-time dose, ceftriaxone 2 g IV one-time dose, Solu-Medrol x 2. -------------------------------------- At bedside, patient is able to tell me her first and last name, age, location, current calendar year.  She reports she has been short of breath since Tuesday and possible 1 week.  She reports that she is taking her Z-Pak and has 2 more days left.  She she was prescribed the Z-Pak and another antibiotic for her right toe.    She reports she does feel better after receiving treatments in the hospital.  She denies trauma to her person, fever, dysuria, hematuria, diarrhea, blood in her stool.  Social history: She lives at home.  She is a former tobacco user, quitting in 2018.  She denies EtOH and recreational drug use.  ROS: Constitutional: no weight change, no fever ENT/Mouth: no sore throat, no rhinorrhea Eyes: no eye pain, no vision changes Cardiovascular: no chest pain, + dyspnea,  no edema, no palpitations Respiratory: + cough, no sputum, no wheezing Gastrointestinal: no nausea, no vomiting, no diarrhea, no constipation Genitourinary: no urinary incontinence, no dysuria, no hematuria Musculoskeletal: no arthralgias, no  myalgias Skin: no skin lesions, no pruritus, Neuro: + weakness, no loss of consciousness, no syncope Psych: no anxiety, no depression, + decrease appetite Heme/Lymph: no bruising, no bleeding  ED Course: Discussed with EDP, patient requiring hospitalization for chief concerns of multilobar pneumonia.  Assessment/Plan  Principal Problem:   Multilobar lung infiltrate Active Problems:   Hyperlipidemia   Leukocytosis   Obesity, Class III, BMI 40-49.9 (morbid obesity) (HCC)   Morbid obesity (HCC)   Type 1 diabetes mellitus with hyperglycemia (HCC)   Toe osteomyelitis, right (HCC)   Constipation   Assessment and Plan:  * Multilobar lung infiltrate Continue with azithromycin 500 mg IV daily, ceftriaxone 2 g IV daily to complete a 5-day course IS, Flutter valve Continuous pulse oximetry  Constipation Patient had a bowel movement 2 days ago and was hard stool and prior to that it was more than a week Patient reports that she likes a stool softener that starts with a D she does not remember what the medication name was Docusate 100 mg p.o. twice daily resumed  Toe osteomyelitis, right (HCC)     Patient states that today her toe looks and feels better than before Patient is currently being followed by podiatry and infectious disease specialist outpatient Continue follow-up as appropriate  Type 1 diabetes mellitus with hyperglycemia (HCC) Insulin SSI with HS coverage ordered, steroid/obesity Goal inpatient BG is 140-180  Morbid obesity (HCC) This complicates overall care and prognosis.   Obesity, Class III, BMI 40-49.9 (morbid obesity) (HCC) This complicates overall care and prognosis.   Leukocytosis Suspect secondary to multifocal pneumonia, treat per above reCheck CBC in a.m.  Hyperlipidemia  Home Zetia 10 mg daily resumed Outpatient cardiology on 07/12/2023: Reviewed; patient was advised to stop statin due to intolerance.  Stop Lipitor and start Zetia 10 mg  daily  Chart reviewed.   DVT prophylaxis: Heparin 5000 units subcutaneous every 8 hours Code Status: Full code Diet: Heart healthy/carb modified Family Communication: No Disposition Plan: Pending clinical course Consults called: None at this time Admission status: Telemetry cardiac, inpatient  Past Medical History:  Diagnosis Date   Allergy    Anxiety    Arthritis    Asthma    Depression    Diabetes mellitus without complication (HCC)    GERD (gastroesophageal reflux disease)    History of chicken pox 04/07/2015   DID have Chicken Pox.     Hyperlipidemia    Hypertension    Neuromuscular disorder (HCC)    Venous (peripheral) insufficiency    Past Surgical History:  Procedure Laterality Date   APPENDECTOMY  1979   APPLICATION OF WOUND VAC Right 01/07/2021   Procedure: APPLICATION OF WOUND VAC;  Surgeon: Edwin Cap, DPM;  Location: [facility] ORS;  Service: Podiatry;  Laterality: Right;   BILATERAL CARPAL TUNNEL RELEASE Bilateral    Dr. Rosita Kea   CESAREAN SECTION  2003   GRAFT APPLICATION Right 01/07/2021   Procedure: GRAFT APPLICATION;  Surgeon: Edwin Cap, DPM;  Location: [facility] ORS;  Service: Podiatry;  Laterality: Right;   INCISION AND DRAINAGE Right 12/01/2020   Procedure: INCISION AND DRAINAGE;  Surgeon: Edwin Cap, DPM;  Location: [facility] ORS;  Service: Podiatry;  Laterality: Right;   INCISION AND DRAINAGE Right 12/07/2020   Procedure: INCISION AND DRAINAGE;  Surgeon: [name], DPM;  Location: [facility] ORS;  Service: Podiatry;  Laterality: Right;   INCISION AND DRAINAGE OF WOUND Right 01/07/2021   Procedure: IRRIGATION AND DEBRIDEMENT WOUND;  Surgeon: Edwin Cap, DPM;  Location: [facility] ORS;  Service: Podiatry;  Laterality: Right;  regional block if possible   LAPAROSCOPIC LYSIS OF ADHESIONS     LOWER EXTREMITY ANGIOGRAPHY Right 11/01/2023   Procedure: Lower Extremity Angiography;  Surgeon: Annice Needy, MD;  Location: [facility];  Service: Cardiovascular;   Laterality: Right;   SHOULDER SURGERY Right 08/2009   Dr. Rosita Kea   TUBAL LIGATION     Social History:  reports that she quit smoking about 6 years ago. Her smoking use included cigarettes. She started smoking about 38 years ago. She has a 32.5 pack-year smoking history. She has never used smokeless tobacco. She reports current alcohol use of about 1.0 - 2.0 standard drink of alcohol per week. She reports that she does not use drugs.  Allergies  Allergen Reactions   Atorvastatin     Muscle and join pains.   Levaquin  [Levofloxacin In D5w]     GI upset   Simvastatin     joint aches.   Dilaudid  [Hydromorphone Hcl] Hives, Itching and Rash   Tetracycline Rash   Family History  Problem Relation Age of Onset   Hypertension Mother    Diabetes Mother    Hyperlipidemia Mother    Arthritis Mother    Heart disease Father    Kidney disease Father    Family history: Family history reviewed and not pertinent.  Prior to Admission medications   Medication Sig Start Date End Date Taking? Authorizing Provider  albuterol (VENTOLIN HFA) 108 (90 Base) MCG/ACT inhaler Inhale 2 puffs into the lungs every 6 (six) hours as needed for wheezing or shortness of breath. 08/25/22  Concha Se, MD  amitriptyline (ELAVIL) 25 MG tablet Take 25 mg by mouth at bedtime as needed (neuropathy pain).    [provider]  amoxicillin-clavulanate (AUGMENTIN) 875-125 MG tablet Take 1 tablet by mouth every 12 (twelve) hours for 14 days. 11/02/23 11/16/23  Tresa Moore, MD  aspirin 81 MG tablet Take 81 mg by mouth daily.    [provider]  atorvastatin (LIPITOR) 40 MG tablet Take 40 mg by mouth daily. 11/05/23   [provider]  cetirizine (ZYRTEC) 10 MG tablet TAKE 1 TABLET(10 MG) BY MOUTH DAILY Patient taking differently: Take 10 mg by mouth daily. 05/16/17   Malva Limes, MD  ciprofloxacin (CIPRO) 500 MG tablet Take 1 tablet (500 mg total) by mouth 2 (two) times daily for 14 days.  11/02/23 11/16/23  Tresa Moore, MD  clopidogrel (PLAVIX) 75 MG tablet Take 1 tablet (75 mg total) by mouth daily. 11/03/23 01/02/24  Tresa Moore, MD  Continuous Glucose Sensor (DEXCOM G6 SENSOR) MISC SMARTSIG:Topical Every 10 Days    [provider]  Continuous Glucose Transmitter (DEXCOM G6 TRANSMITTER) MISC USE TO MONITOR BLOOD SUGAR. REPLACE EVERY 3 MONTHS 04/10/23   [provider]  esomeprazole (NEXIUM) 40 MG capsule Take 40 mg by mouth daily. 07/24/23   [provider]  ezetimibe (ZETIA) 10 MG tablet Take 1 tablet (10 mg total) by mouth daily. 07/12/23 11/01/23  Debbe Odea, MD  FLUoxetine (PROZAC) 20 MG capsule Take 1 capsule (20 mg total) by mouth daily. Patient taking differently: Take 40 mg by mouth daily. 12/10/20   Alford Highland, MD  furosemide (LASIX) 20 MG tablet Take 1 tablet (20 mg total) by mouth daily. 07/12/23 11/01/23  Debbe Odea, MD  gabapentin (NEURONTIN) 300 MG capsule Take 3 capsules by mouth at bedtime. 10/30/23   [provider]  HUMALOG KWIKPEN 200 UNIT/ML KwikPen Inject 80 Units into the skin daily. 09/24/23   [provider]  Insulin Disposable Pump (OMNIPOD 5 DEXG7G6 PODS GEN 5) MISC SMARTSIG:1 Each SUB-Q Every 3 Days    [provider]  oxyCODONE (OXY IR/ROXICODONE) 5 MG immediate release tablet Take 1 tablet (5 mg total) by mouth every 4 (four) hours as needed for moderate pain (pain score 4-6). 11/02/23   Tresa Moore, MD  rosuvastatin (CRESTOR) 10 MG tablet Take 1 tablet (10 mg total) by mouth daily. 11/03/23 01/02/24  Tresa Moore, MD  Semaglutide-Weight Management 0.5 MG/0.5ML SOAJ Inject 0.5 mg into the skin once a week. 10/04/23   [provider]  senna-docusate (SENOKOT-S) 8.6-50 MG tablet Take 1 tablet by mouth daily. 11/02/23   Tresa Moore, MD    Physical Exam: Vitals:   11/16/23 1845 11/16/23 1930 11/16/23 2000 11/16/23 2015  BP:    121/62  Pulse: 96 99 96 99   Resp:  20 (!) 21 20  Temp:      TempSrc:      SpO2: 94% 95% 95% 95%  Weight:      Height:       Constitutional: appears older than chronological age, frail Eyes: PERRL, lids and conjunctivae normal ENMT: Mucous membranes are moist. Posterior pharynx clear of any exudate or lesions. Age-appropriate dentition. Hearing appropriate Neck: normal, supple, no masses, no thyromegaly Respiratory: clear to auscultation bilaterally, no crackles.  Increased respiratory effort.  Increased accessory muscle use.  End expiratory mild wheezing on exam Cardiovascular: Regular rate and rhythm, no murmurs / rubs / gallops. No extremity edema. 2+  pedal pulses. No carotid bruits.  Abdomen: no tenderness, no masses palpated, no hepatosplenomegaly. Bowel sounds positive.  Musculoskeletal: no clubbing / cyanosis. No joint deformity upper and lower extremities. Good ROM, no contractures, no atrophy. Normal muscle tone.  Skin: no rashes, lesions, ulcers. No induration Neurologic: Sensation intact. Strength 5/5 in all 4.  Psychiatric: Normal judgment and insight. Alert and oriented x 3. Normal mood.   EKG: independently reviewed, showing sinus rhythm with rate of 91, QTc 484  Chest x-ray on Admission: I personally reviewed and I agree with radiologist reading as below.  DG Chest 2 View Result Date: 11/16/2023 CLINICAL DATA:  Shortness of breath. EXAM: CHEST - 2 VIEW COMPARISON:  02/19/2023 FINDINGS: There are heterogeneous faint opacities overlying the right hemithorax, compatible with multilobar pneumonia. Follow-up to clearing is recommended. There is also left retrocardiac opacity partially obscuring the left hemidiaphragm and blunting the left posterior costophrenic angle. Bilateral costophrenic angles are clear. Normal cardio-mediastinal silhouette. No acute osseous abnormalities. The soft tissues are within normal limits. IMPRESSION: *Heterogeneous opacities overlying the right lung and in the left retrocardiac  region, compatible with multilobar pneumonia. Follow-up to clearing is recommended. There is associated small left pleural effusion. Electronically Signed   By: Jules Schick M.D.   On: 11/16/2023 14:06   Labs on Admission: I have personally reviewed following labs  CBC: Recent Labs  Lab 11/13/23 0945 11/16/23 1319  WBC 13.3* 15.2*  NEUTROABS 9.5*  --   HGB 8.9* 9.0*  HCT 29.1* 28.3*  MCV 91.5 90.1  PLT 329 336   Basic Metabolic Panel: Recent Labs  Lab 11/13/23 0945 11/16/23 1319  NA 137 135  K 4.4 3.8  CL 104 102  CO2 25 20*  GLUCOSE 174* 231*  BUN 20 15  CREATININE 1.46* 1.46*  CALCIUM 8.4* 8.1*   GFR: Estimated Creatinine Clearance: 48.6 mL/min (A) (by C-G formula based on SCr of 1.46 mg/dL (H)).  Liver Function Tests: Recent Labs  Lab 11/13/23 0945 11/16/23 1319  AST 22 27  ALT 17 19  ALKPHOS 69 71  BILITOT 0.4 0.4  PROT 7.3 7.3  ALBUMIN 3.4* 3.3*   Urine analysis:    Component Value Date/Time   COLORURINE YELLOW (A) 04/26/2019 1650   APPEARANCEUR CLEAR (A) 04/26/2019 1650   LABSPEC 1.013 04/26/2019 1650   PHURINE 7.0 04/26/2019 1650   GLUCOSEU >=500 (A) 04/26/2019 1650   HGBUR MODERATE (A) 04/26/2019 1650   BILIRUBINUR NEGATIVE 04/26/2019 1650   KETONESUR 5 (A) 04/26/2019 1650   PROTEINUR NEGATIVE 04/26/2019 1650   NITRITE NEGATIVE 04/26/2019 1650   LEUKOCYTESUR SMALL (A) 04/26/2019 1650   This document was prepared using Dragon Voice Recognition software and may include unintentional dictation errors.  Dr. Sedalia Muta [facility]  If 7PM-7AM, please contact overnight-coverage provider If 7AM-7PM, please contact day attending provider [email or web address]  11/16/2023, 8:27 PM

## 2023-11-16 NOTE — Assessment & Plan Note (Signed)
Insulin SSI with HS coverage ordered, steroid/obesity Goal inpatient BG is 140-180

## 2023-11-16 NOTE — Assessment & Plan Note (Addendum)
Continue with azithromycin 500 mg IV daily, ceftriaxone 2 g IV daily to complete a 5-day course IS, Flutter valve Continuous pulse oximetry

## 2023-11-16 NOTE — Telephone Encounter (Signed)
Called patient to inform her of the recent labs - ESR and CRP which are high  Also the culture Dr.Baker took from the rt toe is not back- He will let her know of the results and adjust antibiotic as needed Pt says toe is looking better but it is her breathing that is bad and she is on her way to the ED.

## 2023-11-16 NOTE — ED Provider Notes (Signed)
[facility] Provider Note    Event Date/Time   First MD Initiated Contact with Patient 11/16/23 1532     (approximate)   History   Shortness of Breath   HPI  Emily Collins is a 58 y.o. female with a history of diabetes, hypertension, asthma who presents with complaints of shortness of breath and cough.  Patient reports she has been feeling ill for nearly a week.  She reports she cannot walk 20 feet without feeling extremely winded     Physical Exam   Triage Vital Signs: ED Triage Vitals [11/16/23 1244]  Encounter Vitals Group     BP (!) 143/66     Systolic BP Percentile      Diastolic BP Percentile      Pulse Rate 94     Resp 18     Temp 98.3 F (36.8 C)     Temp Source Oral     SpO2 99 %     Weight 108 kg (238 lb 1.6 oz)     Height 1.575 m (5\' 2" )     Head Circumference      Peak Flow      Pain Score 0     Pain Loc      Pain Education      Exclude from Growth Chart     Most recent vital signs: Vitals:   11/16/23 1805 11/16/23 1845  BP:    Pulse: (!) 102 96  Resp:    Temp:    SpO2: 96% 94%     General: Awake, no distress.  CV:  Good peripheral perfusion.  Resp:  Mild tachypnea, scattered wheezes Abd:  No distention.  Other:  Mild edema bilaterally   ED Results / Procedures / Treatments   Labs (all labs ordered are listed, but only abnormal results are displayed) Labs Reviewed  CBC - Abnormal; Notable for the following components:      Result Value   WBC 15.2 (*)    RBC 3.14 (*)    Hemoglobin 9.0 (*)    HCT 28.3 (*)    All other components within normal limits  COMPREHENSIVE METABOLIC PANEL - Abnormal; Notable for the following components:   CO2 20 (*)    Glucose, Bld 231 (*)    Creatinine, Ser 1.46 (*)    Calcium 8.1 (*)    Albumin 3.3 (*)    GFR, Estimated 41 (*)    All other components within normal limits  BRAIN NATRIURETIC PEPTIDE - Abnormal; Notable for the following components:   B Natriuretic  Peptide 735.9 (*)    All other components within normal limits  RESP PANEL BY RT-PCR (RSV, FLU A&B, COVID)  [id number]  CULTURE, BLOOD (ROUTINE X 2)  CULTURE, BLOOD (ROUTINE X 2)  LACTIC ACID, PLASMA  BASIC METABOLIC PANEL  CBC     EKG  ED ECG REPORT I, Jene Every, the attending physician, personally viewed and interpreted this ECG.  Date: 11/16/2023  Rhythm: normal sinus rhythm QRS Axis: normal Intervals: normal ST/T Wave abnormalities: Nonspecific changes Narrative Interpretation: no evidence of acute ischemia    RADIOLOGY Chest x-ray was consistent with multi lobar pneumonia    PROCEDURES:  Critical Care performed:   Procedures   MEDICATIONS ORDERED IN ED: Medications  acetaminophen (TYLENOL) tablet 650 mg (has no administration in time range)    Or  acetaminophen (TYLENOL) suppository 650 mg (has no administration in time range)  ondansetron (ZOFRAN) tablet 4 mg (has no  administration in time range)    Or  ondansetron (ZOFRAN) injection 4 mg (has no administration in time range)  heparin injection 5,000 Units (has no administration in time range)  senna-docusate (Senokot-S) tablet 1 tablet (has no administration in time range)  insulin aspart (novoLOG) injection 0-5 Units (has no administration in time range)  insulin aspart (novoLOG) injection 0-20 Units (has no administration in time range)  cefTRIAXone (ROCEPHIN) 2 g in sodium chloride 0.9 % 100 mL IVPB (has no administration in time range)  azithromycin (ZITHROMAX) 500 mg in sodium chloride 0.9 % 250 mL IVPB (has no administration in time range)  ipratropium-albuterol (DUONEB) 0.5-2.5 (3) MG/3ML nebulizer solution 3 mL (3 mLs Nebulization Given 11/16/23 1622)  ipratropium-albuterol (DUONEB) 0.5-2.5 (3) MG/3ML nebulizer solution 3 mL (3 mLs Nebulization Given 11/16/23 1622)  cefTRIAXone (ROCEPHIN) 2 g in sodium chloride 0.9 % 100 mL IVPB (0 g Intravenous Stopped 11/16/23 1650)  azithromycin (ZITHROMAX) 500 mg  in sodium chloride 0.9 % 250 mL IVPB (0 mg Intravenous Stopped 11/16/23 1751)  ipratropium-albuterol (DUONEB) 0.5-2.5 (3) MG/3ML nebulizer solution 3 mL (3 mLs Nebulization Given 11/16/23 1750)  methylPREDNISolone sodium succinate (SOLU-MEDROL) 125 mg/2 mL injection 125 mg (125 mg Intravenous Given 11/16/23 1750)     IMPRESSION / MDM / ASSESSMENT AND PLAN / ED COURSE  I reviewed the triage vital signs and the nursing notes. Patient's presentation is most consistent with acute presentation with potential threat to life or bodily function.  Patient presents with shortness of breath as detailed above, differential includes URI, bronchospasm, pneumonia, pulmonary edema  Lab work notable for elevated white blood cell count, elevated BNP, lactic acid is normal.  Chest x-ray suspicious for multi lobar pneumonia, will treat with IV Rocephin, IV azithromycin.  Treated with DuoNebs for wheezing on exam  No significant improvement after treatment, patient will require admission, discussed with Dr. Sedalia Muta of the hospitalist team        FINAL CLINICAL IMPRESSION(S) / ED DIAGNOSES   Final diagnoses:  Multilobar lung infiltrate  Community acquired pneumonia, unspecified laterality     Rx / DC Orders   ED Discharge Orders     None        Note:  This document was prepared using Dragon voice recognition software and may include unintentional dictation errors.   Jene Every, MD 11/16/23 Mikle Bosworth

## 2023-11-16 NOTE — ED Triage Notes (Signed)
Pt here with SOB since last week. Pt has audible inspiratory wheezing. Pt denies cp. Pt denies NVD as well. Pt also has a cough that is not productive.

## 2023-11-16 NOTE — ED Notes (Signed)
Pt taken to bathroom at this time.  

## 2023-11-16 NOTE — ED Notes (Signed)
Lung sounds clear bilaterally, tachypnea noted.

## 2023-11-16 NOTE — Assessment & Plan Note (Addendum)
Suspect secondary to multifocal pneumonia, treat per above reCheck CBC in a.m.

## 2023-11-17 DIAGNOSIS — E1065 Type 1 diabetes mellitus with hyperglycemia: Secondary | ICD-10-CM

## 2023-11-17 DIAGNOSIS — M869 Osteomyelitis, unspecified: Secondary | ICD-10-CM | POA: Diagnosis not present

## 2023-11-17 DIAGNOSIS — I5033 Acute on chronic diastolic (congestive) heart failure: Secondary | ICD-10-CM

## 2023-11-17 LAB — RESPIRATORY PANEL BY PCR

## 2023-11-17 LAB — BASIC METABOLIC PANEL
Anion gap: 10 (ref 5–15)
BUN: 23 mg/dL — ABNORMAL HIGH (ref 6–20)
CO2: 21 mmol/L — ABNORMAL LOW (ref 22–32)
Calcium: 8 mg/dL — ABNORMAL LOW (ref 8.9–10.3)
Chloride: 104 mmol/L (ref 98–111)
Creatinine, Ser: 1.65 mg/dL — ABNORMAL HIGH (ref 0.44–1.00)
GFR, Estimated: 36 mL/min — ABNORMAL LOW (ref >60)
Glucose, Bld: 348 mg/dL — ABNORMAL HIGH (ref 70–99)
Potassium: 4.7 mmol/L (ref 3.5–5.1)
Sodium: 135 mmol/L (ref 135–145)

## 2023-11-17 LAB — IRON AND TIBC
Iron: 13 ug/dL — ABNORMAL LOW (ref 28–170)
Saturation Ratios: 5 % — ABNORMAL LOW (ref 10.4–31.8)
TIBC: 265 ug/dL (ref 250–450)
UIBC: 252 ug/dL

## 2023-11-17 LAB — PROCALCITONIN: Procalcitonin: <0.1 ng/mL

## 2023-11-17 LAB — GLUCOSE, CAPILLARY
Glucose-Capillary: 360 mg/dL — ABNORMAL HIGH (ref 70–99)
Glucose-Capillary: 272 mg/dL — ABNORMAL HIGH (ref 70–99)

## 2023-11-17 LAB — CBC
HCT: 30.1 % — ABNORMAL LOW (ref 36.0–46.0)
Hemoglobin: 9.3 g/dL — ABNORMAL LOW (ref 12.0–15.0)
MCH: 27.8 pg (ref 26.0–34.0)
MCHC: 30.9 g/dL (ref 30.0–36.0)
MCV: 90.1 fL (ref 80.0–100.0)
Platelets: 337 10*3/uL (ref 150–400)
RBC: 3.34 MIL/uL — ABNORMAL LOW (ref 3.87–5.11)
RDW: 14.4 % (ref 11.5–15.5)
WBC: 18.4 10*3/uL — ABNORMAL HIGH (ref 4.0–10.5)
nRBC: 0 % (ref 0.0–0.2)

## 2023-11-17 LAB — FERRITIN: Ferritin: 145 ng/mL (ref 11–307)

## 2023-11-17 MED ORDER — SODIUM BICARBONATE 650 MG PO TABS
650.0000 mg | ORAL_TABLET | Freq: Two times a day (BID) | ORAL | Status: DC
  Administered 2023-11-17 – 2023-11-18 (×3): 650 mg via ORAL
  Filled 2023-11-17 (×3): qty 1

## 2023-11-17 MED ORDER — FUROSEMIDE 10 MG/ML IJ SOLN
40.0000 mg | Freq: Once | INTRAMUSCULAR | Status: AC
  Administered 2023-11-17: 40 mg via INTRAVENOUS
  Filled 2023-11-17: qty 4

## 2023-11-17 MED ORDER — INSULIN PUMP
Freq: Three times a day (TID) | SUBCUTANEOUS | Status: DC
  Administered 2023-11-17 – 2023-11-18 (×2): 2.5 via SUBCUTANEOUS
  Administered 2023-11-18: 7.6 via SUBCUTANEOUS
  Administered 2023-11-18: 7.05 via SUBCUTANEOUS
  Administered 2023-11-19: 0.7 via SUBCUTANEOUS
  Administered 2023-11-20: 6.5 via SUBCUTANEOUS
  Administered 2023-11-20: 7.35 via SUBCUTANEOUS
  Administered 2023-11-21: 8.75 via SUBCUTANEOUS
  Filled 2023-11-17: qty 1

## 2023-11-17 MED ORDER — AZITHROMYCIN 250 MG PO TABS
500.0000 mg | ORAL_TABLET | Freq: Every day | ORAL | Status: DC
  Administered 2023-11-18: 500 mg via ORAL
  Filled 2023-11-17: qty 2

## 2023-11-17 NOTE — Plan of Care (Signed)
  Problem: Education: Goal: Ability to describe self-care measures that may prevent or decrease complications (Diabetes Survival Skills Education) will improve Outcome: Progressing Goal: Individualized Educational Video(s) Outcome: Progressing   Problem: Coping: Goal: Ability to adjust to condition or change in health will improve Outcome: Progressing   Problem: Fluid Volume: Goal: Ability to maintain a balanced intake and output will improve Outcome: Progressing   Problem: Health Behavior/Discharge Planning: Goal: Ability to identify and utilize available resources and services will improve Outcome: Progressing Goal: Ability to manage health-related needs will improve Outcome: Progressing   Problem: Metabolic: Goal: Ability to maintain appropriate glucose levels will improve Outcome: Progressing   Problem: Nutritional: Goal: Maintenance of adequate nutrition will improve Outcome: Progressing Goal: Progress toward achieving an optimal weight will improve Outcome: Progressing   Problem: Skin Integrity: Goal: Risk for impaired skin integrity will decrease Outcome: Progressing   Problem: Tissue Perfusion: Goal: Adequacy of tissue perfusion will improve Outcome: Progressing   Problem: Education: Goal: Knowledge of General Education information will improve Description: Including pain rating scale, medication(s)/side effects and non-pharmacologic comfort measures Outcome: Progressing   Problem: Health Behavior/Discharge Planning: Goal: Ability to manage health-related needs will improve Outcome: Progressing   Problem: Clinical Measurements: Goal: Ability to maintain clinical measurements within normal limits will improve Outcome: Progressing Goal: Will remain free from infection Outcome: Progressing Goal: Diagnostic test results will improve Outcome: Progressing Goal: Respiratory complications will improve Outcome: Progressing Goal: Cardiovascular complication will  be avoided Outcome: Progressing   Problem: Activity: Goal: Risk for activity intolerance will decrease Outcome: Progressing   Problem: Nutrition: Goal: Adequate nutrition will be maintained Outcome: Progressing   Problem: Safety: Goal: Ability to remain free from injury will improve Outcome: Progressing   Problem: Pain Managment: Goal: General experience of comfort will improve and/or be controlled Outcome: Progressing   Problem: Skin Integrity: Goal: Risk for impaired skin integrity will decrease Outcome: Progressing

## 2023-11-17 NOTE — Plan of Care (Signed)

## 2023-11-17 NOTE — Progress Notes (Addendum)
  Progress Note   Patient: Emily Collins [id number]:[id number] DOB: June 28, 1966 DOA: 11/16/2023     1 DOS: the patient was seen and examined on 11/17/2023   Brief hospital course: [name] is a 58 yo female with history of COPD, asthma, depression, hyperlipemia, who presents for chief concern of shortness of breath.  Chest x-ray showed a right sided multifocal infiltrates.  Was started on Rocephin and Zithromax. However, patient has been having leg edema, paroxysmal nocturnal dyspnea and elevated BNP.  IV Lasix started for acute on chronic diastolic congestive heart failure.   Principal Problem:   Multilobar lung infiltrate Active Problems:   Hyperlipidemia   Leukocytosis   Obesity, Class III, BMI 40-49.9 (morbid obesity) (HCC)   Morbid obesity (HCC)   Type 1 diabetes mellitus with hyperglycemia (HCC)   Toe osteomyelitis, right (HCC)   Constipation   Acute on chronic diastolic CHF (congestive heart failure) (HCC)   Assessment and Plan: Acute on chronic diastolic congestive heart failure. Lung infiltrates. Patient has evidence of acute on chronic congestive heart failure.  Reviewed recent echocardiogram, patient had a normal ejection fraction with grade 1 diastolic dysfunction.  I will start IV Lasix. I personally reviewed patient chest x-ray, not convinced the patient has pneumonia.  Obtain procalcitonin level.  Will discontinue antibiotics if not elevated.  Uncontrolled type 1 diabetes with hyperglycemia. Patient received high dose of IV steroids in the emergency room, glucose running high.  No need for additional steroids, I will start the patient insulin pump.  Monitor glucose.  Chronic kidney disease stage IIIb. Metabolic acidosis. Patient renal function still stable, mild metabolic acidosis.  Will continue to follow renal function with IV diuretics.  I will start a lower dose of sodium bicarb.  Class III obesity with BMI 43.55. Diet and excise advised.   toe  osteomyelitis. Patient was on Augmentin and Cipro.  Currently on Rocephin and Flagyl.  Will change to oral Augmentin and Cipro after pneumonia ruled out.    Subjective:  Patient still complaining short of breath with exertion, had some short of breath last night.  No cough.  No fever or chills.  Physical Exam: Vitals:   11/16/23 2000 11/16/23 2015 11/16/23 2147 11/17/23 0256  BP:  121/62 (!) 113/58 (!) 123/58  Pulse: 96 99 86 88  Resp: (!) 21 20 20    Temp:   98.4 F (36.9 C) 98.1 F (36.7 C)  TempSrc:   Oral Oral  SpO2: 95% 95% 92% 92%  Weight:      Height:       General exam: Appears calm and comfortable, morbid obese. Respiratory system: Decreased breath sounds. Respiratory effort normal. Cardiovascular system: S1 & S2 heard, RRR. No JVD, murmurs, rubs, gallops or clicks.  Gastrointestinal system: Abdomen is nondistended, soft and nontender. No organomegaly or masses felt. Normal bowel sounds heard. Central nervous system: Alert and oriented. No focal neurological deficits. Extremities: 1+ leg edema Skin: No rashes, lesions or ulcers Psychiatry: Judgement and insight appear normal. Mood & affect appropriate.    Data Reviewed:  Reviewed her prior MRI results, reviewed CT chest x-ray and imaging, lab results.  Family Communication: None  Disposition: Status is: Inpatient Remains inpatient appropriate because: Severity of disease, IV treatment.     Time spent: 55 minutes  Author: Marrion Coy, MD 11/17/2023 11:39 AM  For on call review [email or web address].

## 2023-11-18 DIAGNOSIS — I5033 Acute on chronic diastolic (congestive) heart failure: Secondary | ICD-10-CM | POA: Diagnosis not present

## 2023-11-18 DIAGNOSIS — E1065 Type 1 diabetes mellitus with hyperglycemia: Secondary | ICD-10-CM | POA: Diagnosis not present

## 2023-11-18 DIAGNOSIS — M869 Osteomyelitis, unspecified: Secondary | ICD-10-CM | POA: Diagnosis not present

## 2023-11-18 LAB — CBC
HCT: 29.2 % — ABNORMAL LOW (ref 36.0–46.0)
Hemoglobin: 9 g/dL — ABNORMAL LOW (ref 12.0–15.0)
MCH: 27.7 pg (ref 26.0–34.0)
MCHC: 30.8 g/dL (ref 30.0–36.0)
MCV: 89.8 fL (ref 80.0–100.0)
Platelets: 401 10*3/uL — ABNORMAL HIGH (ref 150–400)
RBC: 3.25 MIL/uL — ABNORMAL LOW (ref 3.87–5.11)
RDW: 14.5 % (ref 11.5–15.5)
WBC: 24 10*3/uL — ABNORMAL HIGH (ref 4.0–10.5)
nRBC: 0 % (ref 0.0–0.2)

## 2023-11-18 LAB — GLUCOSE, CAPILLARY
Glucose-Capillary: 140 mg/dL — ABNORMAL HIGH (ref 70–99)
Glucose-Capillary: 145 mg/dL — ABNORMAL HIGH (ref 70–99)
Glucose-Capillary: 116 mg/dL — ABNORMAL HIGH (ref 70–99)

## 2023-11-18 LAB — BASIC METABOLIC PANEL
Anion gap: 11 (ref 5–15)
BUN: 35 mg/dL — ABNORMAL HIGH (ref 6–20)
CO2: 24 mmol/L (ref 22–32)
Calcium: 8 mg/dL — ABNORMAL LOW (ref 8.9–10.3)
Chloride: 103 mmol/L (ref 98–111)
Creatinine, Ser: 1.66 mg/dL — ABNORMAL HIGH (ref 0.44–1.00)
GFR, Estimated: 36 mL/min — ABNORMAL LOW (ref >60)
Glucose, Bld: 80 mg/dL (ref 70–99)
Potassium: 3.4 mmol/L — ABNORMAL LOW (ref 3.5–5.1)
Sodium: 138 mmol/L (ref 135–145)

## 2023-11-18 LAB — MAGNESIUM: Magnesium: 2.4 mg/dL (ref 1.7–2.4)

## 2023-11-18 LAB — VITAMIN B12: Vitamin B-12: 363 pg/mL (ref 180–914)

## 2023-11-18 MED ORDER — CIPROFLOXACIN HCL 500 MG PO TABS
500.0000 mg | ORAL_TABLET | Freq: Two times a day (BID) | ORAL | Status: DC
  Administered 2023-11-18 – 2023-11-21 (×7): 500 mg via ORAL
  Filled 2023-11-18 (×9): qty 1

## 2023-11-18 MED ORDER — AMOXICILLIN-POT CLAVULANATE 875-125 MG PO TABS
1.0000 | ORAL_TABLET | Freq: Two times a day (BID) | ORAL | Status: DC
  Administered 2023-11-18 – 2023-11-21 (×7): 1 via ORAL
  Filled 2023-11-18 (×7): qty 1

## 2023-11-18 MED ORDER — POLYSACCHARIDE IRON COMPLEX 150 MG PO CAPS
150.0000 mg | ORAL_CAPSULE | Freq: Every day | ORAL | Status: DC
  Administered 2023-11-18 – 2023-11-21 (×4): 150 mg via ORAL
  Filled 2023-11-18 (×4): qty 1

## 2023-11-18 MED ORDER — ROSUVASTATIN CALCIUM 10 MG PO TABS: 10.0000 mg | ORAL_TABLET | Freq: Every day | ORAL | Status: DC

## 2023-11-18 MED ORDER — AMITRIPTYLINE HCL 25 MG PO TABS
25.0000 mg | ORAL_TABLET | Freq: Every evening | ORAL | Status: DC | PRN
  Administered 2023-11-20: 25 mg via ORAL
  Filled 2023-11-18 (×2): qty 1

## 2023-11-18 MED ORDER — ATORVASTATIN CALCIUM 20 MG PO TABS: 40.0000 mg | ORAL_TABLET | Freq: Every day | ORAL | Status: DC

## 2023-11-18 MED ORDER — OXYCODONE HCL 5 MG PO TABS
5.0000 mg | ORAL_TABLET | ORAL | Status: DC | PRN
  Administered 2023-11-20 – 2023-11-21 (×2): 5 mg via ORAL
  Filled 2023-11-18 (×2): qty 1

## 2023-11-18 MED ORDER — POTASSIUM CHLORIDE CRYS ER 20 MEQ PO TBCR
40.0000 meq | EXTENDED_RELEASE_TABLET | Freq: Two times a day (BID) | ORAL | Status: AC
  Administered 2023-11-18 (×2): 40 meq via ORAL
  Filled 2023-11-18 (×2): qty 2

## 2023-11-18 MED ORDER — POTASSIUM CHLORIDE CRYS ER 20 MEQ PO TBCR: 40.0000 meq | EXTENDED_RELEASE_TABLET | Freq: Once | ORAL | Status: DC

## 2023-11-18 MED ORDER — MELATONIN 5 MG PO TABS
5.0000 mg | ORAL_TABLET | Freq: Every day | ORAL | Status: DC
  Administered 2023-11-18 – 2023-11-20 (×3): 5 mg via ORAL
  Filled 2023-11-18 (×3): qty 1

## 2023-11-18 MED ORDER — ASPIRIN 81 MG PO TBEC
81.0000 mg | DELAYED_RELEASE_TABLET | Freq: Every day | ORAL | Status: DC
  Administered 2023-11-18 – 2023-11-21 (×4): 81 mg via ORAL
  Filled 2023-11-18 (×4): qty 1

## 2023-11-18 NOTE — Plan of Care (Signed)
  Problem: Coping: Goal: Ability to adjust to condition or change in health will improve Outcome: Progressing   Problem: Health Behavior/Discharge Planning: Goal: Ability to manage health-related needs will improve Outcome: Progressing   Problem: Nutritional: Goal: Maintenance of adequate nutrition will improve Outcome: Progressing   Problem: Skin Integrity: Goal: Risk for impaired skin integrity will decrease Outcome: Progressing   Problem: Tissue Perfusion: Goal: Adequacy of tissue perfusion will improve Outcome: Progressing   Problem: Clinical Measurements: Goal: Ability to maintain clinical measurements within normal limits will improve Outcome: Progressing Goal: Will remain free from infection Outcome: Progressing Goal: Diagnostic test results will improve Outcome: Progressing Goal: Respiratory complications will improve Outcome: Progressing Goal: Cardiovascular complication will be avoided Outcome: Progressing   Problem: Activity: Goal: Risk for activity intolerance will decrease Outcome: Progressing   Problem: Nutrition: Goal: Adequate nutrition will be maintained Outcome: Progressing   Problem: Coping: Goal: Level of anxiety will decrease Outcome: Progressing

## 2023-11-18 NOTE — Progress Notes (Signed)
 Progress Note   Patient: Emily Collins [id number] DOB: 06/07/1966 DOA: 11/16/2023     2 DOS: the patient was seen and examined on 11/18/2023   Brief hospital course: Mr. Greenleigh Kauth is a 58 yo female with history of COPD, asthma, depression, hyperlipemia, who presents for chief concern of shortness of breath.  Chest x-ray showed a right sided multifocal infiltrates.  Was started on Rocephin and Zithromax. However, patient has been having leg edema, paroxysmal nocturnal dyspnea and elevated BNP.  IV Lasix started for acute on chronic diastolic congestive heart failure. Patient procalcitonin level less than 0.1, no evidence of any pneumonia, discontinue IV antibiotics, changed to prior oral antibiotics for foot osteomyelitis.   Principal Problem:   Multilobar lung infiltrate Active Problems:   Hyperlipidemia   Leukocytosis   Obesity, Class III, BMI 40-49.9 (morbid obesity) (HCC)   Morbid obesity (HCC)   Type 1 diabetes mellitus with hyperglycemia (HCC)   Toe osteomyelitis, right (HCC)   Constipation   Acute on chronic diastolic CHF (congestive heart failure) (HCC)   Assessment and Plan:  Acute on chronic diastolic congestive heart failure. Lung infiltrates.  Pneumonia ruled out. Patient has evidence of acute on chronic congestive heart failure.  Reviewed recent echocardiogram, patient had a normal ejection fraction with grade 1 diastolic dysfunction.  I will start IV Lasix. I personally reviewed patient chest x-ray, not convinced the patient has pneumonia.  Procalcitonin level less than 0.1.  Patient does not have a new pneumonia.  Condition more consistent with congestive heart failure.  Continue IV Lasix, discontinue Rocephin and Zithromax. Worsening leukocytosis due to high dose of Solu-Medrol given on 2/21.   Uncontrolled type 1 diabetes with hyperglycemia. Patient received high dose of IV steroids in the emergency room, glucose running high.  No need for additional  steroids, I restarted the patient insulin pump.  Monitor glucose.   Chronic kidney disease stage IIIb. Metabolic acidosis. Hypokalemia. Patient renal function still stable, metabolic acidosis improved.  Patient developed mild hypokalemia, will give oral potassium.   Class III obesity with BMI 43.55. Diet and excise advised.    toe osteomyelitis. Patient was on Augmentin and Cipro.  Will resume. No evidence of pneumonia.  Iron deficient anemia. Started iron supplement.       Subjective:  Patient feels better today, short of breath improving.  Making large amount of urine.  Physical Exam: Vitals:   11/17/23 2331 11/18/23 0331 11/18/23 0756 11/18/23 1121  BP: 115/63 (!) 95/52 122/73 118/61  Pulse: 93 85 93 96  Resp: 20 20 20 18   Temp: 98.1 F (36.7 C) 97.8 F (36.6 C) 98.2 F (36.8 C) 98 F (36.7 C)  TempSrc:      SpO2: 98% 99% 96% 94%  Weight:      Height:       General exam: Appears calm and comfortable, morbid obese. Respiratory system: Clear to auscultation. Respiratory effort normal. Cardiovascular system: S1 & S2 heard, RRR. No JVD, murmurs, rubs, gallops or clicks. No pedal edema. Gastrointestinal system: Abdomen is nondistended, soft and nontender. No organomegaly or masses felt. Normal bowel sounds heard. Central nervous system: Alert and oriented. No focal neurological deficits. Extremities: Symmetric 5 x 5 power. Skin: Right toe appears red. Psychiatry: Judgement and insight appear normal. Mood & affect appropriate.    Data Reviewed:  Lab results reviewed.  Family Communication: None  Disposition: Status is: Inpatient Remains inpatient appropriate because: Severity of disease, IV treatment.     Time spent: 35 minutes  Author: Marrion Coy, MD 11/18/2023 11:57 AM  For on call review [email or web address].

## 2023-11-18 NOTE — Plan of Care (Signed)

## 2023-11-19 DIAGNOSIS — E1065 Type 1 diabetes mellitus with hyperglycemia: Secondary | ICD-10-CM | POA: Diagnosis not present

## 2023-11-19 DIAGNOSIS — I5033 Acute on chronic diastolic (congestive) heart failure: Secondary | ICD-10-CM | POA: Diagnosis not present

## 2023-11-19 LAB — MAGNESIUM: Magnesium: 2.4 mg/dL (ref 1.7–2.4)

## 2023-11-19 LAB — CBC
HCT: 27.9 % — ABNORMAL LOW (ref 36.0–46.0)
Hemoglobin: 8.6 g/dL — ABNORMAL LOW (ref 12.0–15.0)
MCH: 27.8 pg (ref 26.0–34.0)
MCHC: 30.8 g/dL (ref 30.0–36.0)
MCV: 90.3 fL (ref 80.0–100.0)
Platelets: 383 10*3/uL (ref 150–400)
RBC: 3.09 MIL/uL — ABNORMAL LOW (ref 3.87–5.11)
RDW: 14.7 % (ref 11.5–15.5)
WBC: 14.3 10*3/uL — ABNORMAL HIGH (ref 4.0–10.5)
nRBC: 0 % (ref 0.0–0.2)

## 2023-11-19 LAB — GLUCOSE, CAPILLARY
Glucose-Capillary: 148 mg/dL — ABNORMAL HIGH (ref 70–99)
Glucose-Capillary: 364 mg/dL — ABNORMAL HIGH (ref 70–99)
Glucose-Capillary: 355 mg/dL — ABNORMAL HIGH (ref 70–99)
Glucose-Capillary: 85 mg/dL (ref 70–99)
Glucose-Capillary: 80 mg/dL (ref 70–99)
Glucose-Capillary: 96 mg/dL (ref 70–99)
Glucose-Capillary: 133 mg/dL — ABNORMAL HIGH (ref 70–99)
Glucose-Capillary: 138 mg/dL — ABNORMAL HIGH (ref 70–99)
Glucose-Capillary: 163 mg/dL — ABNORMAL HIGH (ref 70–99)

## 2023-11-19 LAB — BASIC METABOLIC PANEL
Anion gap: 8 (ref 5–15)
BUN: 32 mg/dL — ABNORMAL HIGH (ref 6–20)
CO2: 26 mmol/L (ref 22–32)
Calcium: 7.8 mg/dL — ABNORMAL LOW (ref 8.9–10.3)
Chloride: 105 mmol/L (ref 98–111)
Creatinine, Ser: 1.59 mg/dL — ABNORMAL HIGH (ref 0.44–1.00)
GFR, Estimated: 37 mL/min — ABNORMAL LOW (ref >60)
Glucose, Bld: 118 mg/dL — ABNORMAL HIGH (ref 70–99)
Potassium: 4.4 mmol/L (ref 3.5–5.1)
Sodium: 139 mmol/L (ref 135–145)

## 2023-11-19 MED ORDER — IPRATROPIUM-ALBUTEROL 0.5-2.5 (3) MG/3ML IN SOLN
3.0000 mL | Freq: Once | RESPIRATORY_TRACT | Status: AC
  Administered 2023-11-19: 3 mL via RESPIRATORY_TRACT
  Filled 2023-11-19: qty 3

## 2023-11-19 NOTE — Inpatient Diabetes Management (Signed)
 Inpatient Diabetes Program Recommendations  AACE/ADA: New Consensus Statement on Inpatient Glycemic Control (2015)  Target Ranges:  Prepandial:   less than 140 mg/dL      Peak postprandial:   less than 180 mg/dL (1-2 hours)      Critically ill patients:  140 - 180 mg/dL   Lab Results  Component Value Date   GLUCAP 133 (H) 11/19/2023   HGBA1C 8.2 (H) 10/31/2023    Review of Glycemic Control  Latest Reference Range & Units 11/18/23 03:31 11/18/23 08:00 11/18/23 11:23 11/18/23 16:01 11/18/23 21:14 11/19/23 03:39  Glucose-Capillary 70 - 99 mg/dL 540 (H) 80 981 (H) 191 (H) 96 133 (H)   Diabetes history: DM 1 Outpatient Diabetes medications:  ENDO: Dr. Tedd Sias Last Seen 07/03/2023 --Continue insulin pump --Continue use of the Humalog U200 in her OmniPod pump --New settings: Basal rate  12am 1.2 units/hr  4 am 1.55 units/hr 24-hr basal = 35 "units" of U200 insulin (equals 70 units of U100 insulin)  Bolus Sensitivity 28 Correction Threshold 120 BG target 110 Insulin: Carb ratio 10    --Start Wegovy 0.25 mg weekly Current orders for Inpatient glycemic control:  Novolog 0-20 units tid with meals and HS Insulin pump Inpatient Diabetes Program Recommendations:    Note patient is managing blood sugars using insulin pump.  Recommend d/c of Novolog correction.  Also needs CBG's checked at least bid to validate CGM readings.  Will follow  Thanks,  Lorenza Cambridge, RN, BC-ADM Inpatient Diabetes Coordinator Pager [phone]  (8a-5p)

## 2023-11-19 NOTE — Plan of Care (Signed)
   Problem: Education: Goal: Knowledge of General Education information will improve Description Including pain rating scale, medication(s)/side effects and non-pharmacologic comfort measures Outcome: Progressing

## 2023-11-19 NOTE — Progress Notes (Signed)
 Progress Note   Patient: Emily Collins [id number]:[id number] DOB: 10/04/1965 DOA: 11/16/2023     3 DOS: the patient was seen and examined on 11/19/2023   Brief hospital course: Emily Collins is a 58 yo female with history of COPD, asthma, depression, hyperlipemia, who presents for chief concern of shortness of breath.  Chest x-ray showed a right sided multifocal infiltrates.  Was started on Rocephin and Zithromax. However, patient has been having leg edema, paroxysmal nocturnal dyspnea and elevated BNP.  IV Lasix started for acute on chronic diastolic congestive heart failure. Patient procalcitonin level less than 0.1, no evidence of any pneumonia, discontinue IV antibiotics, changed to prior oral antibiotics for foot osteomyelitis.   Principal Problem:   Multilobar lung infiltrate Active Problems:   Hyperlipidemia   Leukocytosis   Obesity, Class III, BMI 40-49.9 (morbid obesity) (HCC)   Morbid obesity (HCC)   Type 1 diabetes mellitus with hyperglycemia (HCC)   Toe osteomyelitis, right (HCC)   Constipation   Acute on chronic diastolic CHF (congestive heart failure) (HCC)   Assessment and Plan: Acute on chronic diastolic congestive heart failure. Lung infiltrates.  Pneumonia ruled out. Patient has evidence of acute on chronic congestive heart failure.  Reviewed recent echocardiogram, patient had a normal ejection fraction with grade 1 diastolic dysfunction.  I will start IV Lasix. I personally reviewed patient chest x-ray, not convinced the patient has pneumonia.  Procalcitonin level less than 0.1.  Patient does not have a new pneumonia.  Condition more consistent with congestive heart failure.  Continue IV Lasix, discontinued Rocephin and Zithromax. Worsening leukocytosis due to high dose of Solu-Medrol given on 2/21.  Improved today. No function improving, continue IV Lasix.   Uncontrolled type 1 diabetes with hyperglycemia. Patient received high dose of IV steroids in the  emergency room, glucose running high.  No need for additional steroids, I restarted the patient insulin pump.  Monitor glucose.   Chronic kidney disease stage IIIb. Metabolic acidosis. Hypokalemia. Function improving after IV Lasix, potassium normalized.   Class III obesity with BMI 43.55. Diet and excise advised.    toe osteomyelitis. Patient was on Augmentin and Cipro.  Will resume. No evidence of pneumonia.   Iron deficient anemia. Started iron supplement.  B12 level borderline, added homocystine level.      Subjective:  Patient feels better today, still has short of breath with exertion.  Minimal cough nonproductive.  Physical Exam: Vitals:   11/19/23 0012 11/19/23 0329 11/19/23 0857 11/19/23 1238  BP: 117/64 127/64 (!) 114/58 130/67  Pulse: 84 85 90 85  Resp: 20 18    Temp: 98 F (36.7 C) 98 F (36.7 C) 98.1 F (36.7 C) 98.2 F (36.8 C)  TempSrc: Oral     SpO2: 98% 100% 96% 96%  Weight:      Height:       General exam: Appears calm and comfortable  Respiratory system: Creased breath sounds without wheezes. Respiratory effort normal. Cardiovascular system: S1 & S2 heard, RRR. No JVD, murmurs, rubs, gallops or clicks. No pedal edema. Gastrointestinal system: Abdomen is nondistended, soft and nontender. No organomegaly or masses felt. Normal bowel sounds heard. Central nervous system: Alert and oriented. No focal neurological deficits. Extremities: Symmetric 5 x 5 power. Skin: No rashes, lesions or ulcers Psychiatry: Judgement and insight appear normal. Mood & affect appropriate.    Data Reviewed:  Lab results reviewed.  Family Communication: Husband and son updated at the bedside.  Disposition: Status is: Inpatient Remains inpatient appropriate  because: Severity of disease, IV treatment.     Time spent: 35 minutes  Author: Marrion Coy, MD 11/19/2023 1:31 PM  For on call review [email or web address].

## 2023-11-19 NOTE — Progress Notes (Signed)
 Pt called me to report that when she was walking back to bed from the bathroom (as she has done mulitple times today), for the first time today she felt lightheaded when walking back. She said she got back to bed fine and we took her vitals which were stable. Pt asymptomatic now.,

## 2023-11-19 NOTE — TOC Initial Note (Signed)
 Transition of Care [facility]) - Initial/Assessment Note    Patient Details  Name: Emily Collins MRN: [id number] Date of Birth: June 30, 1966  [facility] CM/SW Contact:    Margarito Liner, LCSW Phone Number: 11/19/2023, 3:01 PM  Clinical Narrative:  Readmission prevention screen complete. CSW met with patient. No family at bedside. CSW introduced role and explained that discharge planning would be discussed. PCP is Tonia Ghent, MD. Patient drives herself to appointments or her husband takes her. [facility] [place] but [facility] will need to get her discharge medications [facility] because she does not get her check until 2/26. Pharmacist is aware. Patient lives home with her husband and two kids. Her mother and oldest son live next door. No home health or DME use prior to admission. Patient is currently on 2.5 L acute oxygen. Will follow for this potential discharge need. No further concerns. CSW will continue to follow patient for support and facilitate return home once stable. Her husband and oldest son will transport her home at discharge.  Expected Discharge Plan: Home/Self Care Barriers to Discharge: Continued Medical Work up   Patient Goals and CMS Choice            Expected Discharge Plan and Services     Post Acute Care Choice: NA Living arrangements for the past 2 months: Single Family Home                                      Prior Living Arrangements/Services Living arrangements for the past 2 months: Single Family Home Lives with:: Spouse, Adult Children Patient language and need for interpreter reviewed:: Yes Do you feel safe going back to the place where you live?: Yes      Need for Family Participation in Patient Care: Yes (Comment) Care giver support system in place?: Yes (comment)   Criminal Activity/Legal Involvement Pertinent to Current Situation/Hospitalization: No - Comment as needed  Activities of Daily Living   ADL  Screening (condition at time of admission) Independently performs ADLs?: Yes (appropriate for developmental age) Is the patient deaf or have difficulty hearing?: No Does the patient have difficulty seeing, even when wearing glasses/contacts?: No Does the patient have difficulty concentrating, remembering, or making decisions?: No  Permission Sought/Granted                  Emotional Assessment Appearance:: Appears stated age Attitude/Demeanor/Rapport: Engaged, Gracious Affect (typically observed): Accepting, Appropriate, Calm, Pleasant Orientation: : Oriented to Place, Oriented to Self, Oriented to  Time, Oriented to Situation Alcohol / Substance Use: Not Applicable Psych Involvement: No (comment)  Admission diagnosis:  Community acquired pneumonia, unspecified laterality [J18.9] Multilobar lung infiltrate [R91.8] Patient Active Problem List   Diagnosis Date Noted   Acute on chronic diastolic CHF (congestive heart failure) (HCC) 11/17/2023   Multilobar lung infiltrate 11/16/2023   Constipation 11/16/2023   Osteomyelitis (HCC) 10/31/2023   Toe osteomyelitis, right (HCC) 10/31/2023   Swelling of limb 05/08/2023   Lymphedema 05/08/2023   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)    Diabetic infection of right foot (HCC) 01/02/2021   Cellulitis of right lower extremity 01/02/2021   Diabetic ulcer of right foot (HCC) 01/02/2021   Asthma 01/02/2021   Depression with anxiety 01/02/2021   CKD (chronic kidney disease), stage IIIa 01/02/2021   Normocytic anemia 01/02/2021   Wound infection_right ankle  01/02/2021   Aortic atherosclerosis (HCC) 12/13/2020   Thrombocytosis    Anemia of chronic disease    Leukocytosis    Hyperkalemia    Acute kidney injury superimposed on CKD (HCC)    Obesity, Class III, BMI 40-49.9 (morbid obesity) (HCC)    Cellulitis and abscess of foot, except toes    Right leg pain    Ulcer of right heel and midfoot with fat layer exposed  (HCC)    Abscess of right leg    Uncontrolled type 2 diabetes mellitus with hyperglycemia (HCC)    Cellulitis 11/29/2020   Morbid obesity (HCC) 08/18/2020   DM type 1 with diabetic peripheral neuropathy (HCC) 06/09/2020   Sepsis (HCC) 04/26/2019   Asthma, intermittent 02/04/2018   Generalized anxiety disorder 02/04/2018   Type 1 diabetes mellitus with hyperglycemia (HCC) 02/04/2018   Arthritis 05/10/2016   Chronic kidney disease 04/05/2016   Allergic rhinitis 04/07/2015   Depression 04/07/2015   Type 1 diabetes mellitus with hypoglycemia without coma (HCC) 04/07/2015   Diabetic retinopathy (HCC) 04/07/2015   Dysesthesia 04/07/2015   Family history of early CAD 04/07/2015   GERD (gastroesophageal reflux disease) 04/07/2015   Hyperlipidemia 04/07/2015   Insomnia 04/07/2015   Tobacco abuse 04/07/2015   Venous insufficiency 04/07/2015   Anxiety 04/07/2015   PCP:  Oswaldo Conroy, MD [facility]acy[facility]   [phone] [place], [place] - [address] AT [facility] [address] [place] [address] Phone: [phone] Fax: [phone]  [facility] [address] [place] [address] Phone: [phone] Fax: [phone]     Social Drivers of Health (SDOH) Social History: SDOH Screenings   Food Insecurity: No Food Insecurity (11/16/2023)  Recent Concern: Food Insecurity - Food Insecurity Present (11/12/2023)   Received from [facility]  Housing: Low Risk  (11/16/2023)  Transportation Needs: No Transportation Needs (11/16/2023)  Utilities: Not At Risk (11/16/2023)  Depression (PHQ2-9): Low Risk  (11/13/2023)  Financial Resource Strain: Medium Risk (11/12/2023)   Received from [facility]  Tobacco Use: Medium Risk (11/16/2023)   SDOH Interventions:     Readmission Risk Interventions    11/19/2023    3:00 PM  Readmission Risk Prevention Plan  Transportation Screening  Complete  PCP or Specialist Appt within 3-5 Days Complete  Social Work Consult for Recovery Care Planning/Counseling Complete  Palliative Care Screening Not Applicable  Medication Review Oceanographer) Complete

## 2023-11-20 ENCOUNTER — Telehealth: Payer: Self-pay | Admitting: Cardiology

## 2023-11-20 ENCOUNTER — Inpatient Hospital Stay: Payer: Medicaid Other

## 2023-11-20 DIAGNOSIS — E1065 Type 1 diabetes mellitus with hyperglycemia: Secondary | ICD-10-CM | POA: Diagnosis not present

## 2023-11-20 DIAGNOSIS — I5033 Acute on chronic diastolic (congestive) heart failure: Secondary | ICD-10-CM | POA: Diagnosis not present

## 2023-11-20 DIAGNOSIS — M869 Osteomyelitis, unspecified: Secondary | ICD-10-CM | POA: Diagnosis not present

## 2023-11-20 LAB — BASIC METABOLIC PANEL
Anion gap: 8 (ref 5–15)
BUN: 28 mg/dL — ABNORMAL HIGH (ref 6–20)
CO2: 25 mmol/L (ref 22–32)
Calcium: 7.8 mg/dL — ABNORMAL LOW (ref 8.9–10.3)
Chloride: 104 mmol/L (ref 98–111)
Creatinine, Ser: 1.46 mg/dL — ABNORMAL HIGH (ref 0.44–1.00)
GFR, Estimated: 41 mL/min — ABNORMAL LOW (ref >60)
Glucose, Bld: 122 mg/dL — ABNORMAL HIGH (ref 70–99)
Potassium: 4.3 mmol/L (ref 3.5–5.1)
Sodium: 137 mmol/L (ref 135–145)

## 2023-11-20 LAB — GLUCOSE, CAPILLARY
Glucose-Capillary: 132 mg/dL — ABNORMAL HIGH (ref 70–99)
Glucose-Capillary: 138 mg/dL — ABNORMAL HIGH (ref 70–99)
Glucose-Capillary: 90 mg/dL (ref 70–99)

## 2023-11-20 LAB — MAGNESIUM: Magnesium: 2.3 mg/dL (ref 1.7–2.4)

## 2023-11-20 MED ORDER — FUROSEMIDE 10 MG/ML IJ SOLN
40.0000 mg | Freq: Two times a day (BID) | INTRAMUSCULAR | Status: DC
  Administered 2023-11-20 – 2023-11-21 (×3): 40 mg via INTRAVENOUS
  Filled 2023-11-20 (×3): qty 4

## 2023-11-20 NOTE — Inpatient Diabetes Management (Signed)
 Inpatient Diabetes Program Recommendations  AACE/ADA: New Consensus Statement on Inpatient Glycemic Control (2015)  Target Ranges:  Prepandial:   less than 140 mg/dL      Peak postprandial:   less than 180 mg/dL (1-2 hours)      Critically ill patients:  140 - 180 mg/dL   Lab Results  Component Value Date   GLUCAP 132 (H) 11/20/2023   HGBA1C 8.2 (H) 10/31/2023   Inpatient Diabetes Program Recommendations:   Spoke with patient via phone (DM coordinator working remotely). Patient's sister is bringing her insulin for the insulin pump and should have everything she needs for the pump.   Thank you, Billy Fischer. Sameka Bagent, RN, MSN, CDCES  Diabetes Coordinator Inpatient Glycemic Control Team Team Pager [phone] (8am-5pm) 11/20/2023 11:53 AM

## 2023-11-20 NOTE — Telephone Encounter (Signed)
 Patient called to report to Dr. Myriam Forehand- Sandie Ano that she was in the hospital at [facility], Room 259, second floor.

## 2023-11-20 NOTE — Progress Notes (Signed)
 Progress Note   Patient: Emily Collins XBJ:[id number] DOB: 04-25-1966 DOA: 11/16/2023     4 DOS: the patient was seen and examined on 11/20/2023   Brief hospital course: [name] is a 58 yo female with history of COPD, asthma, depression, hyperlipemia, who presents for chief concern of shortness of breath.  Chest x-ray showed a right sided multifocal infiltrates.  Was started on Rocephin and Zithromax. However, patient has been having leg edema, paroxysmal nocturnal dyspnea and elevated BNP.  IV Lasix started for acute on chronic diastolic congestive heart failure. Patient procalcitonin level less than 0.1, no evidence of any pneumonia, discontinue IV antibiotics, changed to prior oral antibiotics for foot osteomyelitis.   Principal Problem:   Multilobar lung infiltrate Active Problems:   Hyperlipidemia   Leukocytosis   Obesity, Class III, BMI 40-49.9 (morbid obesity) (HCC)   Morbid obesity (HCC)   Type 1 diabetes mellitus with hyperglycemia (HCC)   Toe osteomyelitis, right (HCC)   Constipation   Acute on chronic diastolic CHF (congestive heart failure) (HCC)   Assessment and Plan: Acute on chronic diastolic congestive heart failure. Lung infiltrates.  Pneumonia ruled out. Patient has evidence of acute on chronic congestive heart failure.  Reviewed recent echocardiogram, patient had a normal ejection fraction with grade 1 diastolic dysfunction.  I will start IV Lasix. I personally reviewed patient chest x-ray, not convinced the patient has pneumonia.  Procalcitonin level less than 0.1.  Patient does not have a new pneumonia.  Condition more consistent with congestive heart failure.  Continue IV Lasix, discontinued Rocephin and Zithromax. Worsening leukocytosis due to high dose of Solu-Medrol given on 2/21.  Improved today. Continue treating with IV Lasix, monitor renal function closely.   Uncontrolled type 1 diabetes with hyperglycemia. Patient received high dose of IV  steroids in the emergency room, glucose running high.  No need for additional steroids, I restarted the patient insulin pump.  Monitor glucose.   Chronic kidney disease stage IIIb. Metabolic acidosis. Hypokalemia. Improving, continue IV Lasix.   Class III obesity with BMI 43.55. Diet and excise advised.    toe osteomyelitis. Patient was on Augmentin and Cipro.  Will resume. No evidence of pneumonia.   Iron deficient anemia. Started iron supplement.  B12 level borderline, pending homocystine level.        Subjective:  Patient has some short of breath last night, cough, nonproductive.  Physical Exam: Vitals:   11/19/23 1830 11/19/23 2012 11/19/23 2310 11/20/23 0432  BP: (!) 120/59 (!) 141/71 (!) 128/52 135/79  Pulse: 93 94 90 87  Resp:  20 18 20   Temp:  98.9 F (37.2 C) 98.2 F (36.8 C) 98.4 F (36.9 C)  TempSrc:  Oral    SpO2: 96% 95% 97% 94%  Weight:      Height:       General exam: Appears calm and comfortable  Respiratory system: Decreased breath sounds. Respiratory effort normal. Cardiovascular system: S1 & S2 heard, RRR. No JVD, murmurs, rubs, gallops or clicks. No pedal edema. Gastrointestinal system: Abdomen is nondistended, soft and nontender. No organomegaly or masses felt. Normal bowel sounds heard. Central nervous system: Alert and oriented. No focal neurological deficits. Extremities: Symmetric 5 x 5 power. Skin: No rashes, lesions or ulcers Psychiatry: Judgement and insight appear normal. Mood & affect appropriate.    Data Reviewed:  Lab results reviewed.  Family Communication: None  Disposition: Status is: Inpatient Remains inpatient appropriate because: Severity of disease, IV treatment.     Time spent:  35 minutes  Author: Marrion Coy, MD 11/20/2023 10:41 AM  For on call review [email or web address].

## 2023-11-20 NOTE — Plan of Care (Signed)

## 2023-11-21 ENCOUNTER — Other Ambulatory Visit: Payer: Self-pay

## 2023-11-21 LAB — CULTURE, BLOOD (ROUTINE X 2)
Culture: NO GROWTH
Culture: NO GROWTH

## 2023-11-21 LAB — BASIC METABOLIC PANEL
Anion gap: 8 (ref 5–15)
BUN: 32 mg/dL — ABNORMAL HIGH (ref 6–20)
CO2: 26 mmol/L (ref 22–32)
Calcium: 8.1 mg/dL — ABNORMAL LOW (ref 8.9–10.3)
Chloride: 103 mmol/L (ref 98–111)
Creatinine, Ser: 1.63 mg/dL — ABNORMAL HIGH (ref 0.44–1.00)
GFR, Estimated: 36 mL/min — ABNORMAL LOW (ref >60)
Glucose, Bld: 113 mg/dL — ABNORMAL HIGH (ref 70–99)
Potassium: 3.9 mmol/L (ref 3.5–5.1)
Sodium: 137 mmol/L (ref 135–145)

## 2023-11-21 LAB — HOMOCYSTEINE: Homocysteine: 17.7 umol/L — ABNORMAL HIGH (ref 0.0–14.5)

## 2023-11-21 LAB — GLUCOSE, CAPILLARY
Glucose-Capillary: 304 mg/dL — ABNORMAL HIGH (ref 70–99)
Glucose-Capillary: 204 mg/dL — ABNORMAL HIGH (ref 70–99)
Glucose-Capillary: 103 mg/dL — ABNORMAL HIGH (ref 70–99)

## 2023-11-21 LAB — MAGNESIUM: Magnesium: 2.3 mg/dL (ref 1.7–2.4)

## 2023-11-21 MED ORDER — FLUOXETINE HCL 20 MG PO CAPS: 40.0000 mg | ORAL_CAPSULE | Freq: Every day | ORAL | Status: DC

## 2023-11-21 MED ORDER — CIPROFLOXACIN HCL 500 MG PO TABS: 500.0000 mg | ORAL_TABLET | Freq: Two times a day (BID) | ORAL | Status: DC

## 2023-11-21 MED ORDER — AMOXICILLIN-POT CLAVULANATE 875-125 MG PO TABS: 1.0000 | ORAL_TABLET | Freq: Two times a day (BID) | ORAL | Status: DC

## 2023-11-21 NOTE — Discharge Summary (Signed)
 Physician Discharge Summary   Patient: Emily Collins MRN: [id number] DOB: April 17, 1966  Admit date:     11/16/2023  Discharge date: 11/21/23  Discharge Physician: Emily Collins   PCP: Emily Conroy, MD   Recommendations at discharge:   Follow-up with PCP in 1 week  Discharge Diagnoses: Principal Problem:   Multilobar lung infiltrate Active Problems:   Hyperlipidemia   Leukocytosis   Obesity, Class III, BMI 40-49.9 (morbid obesity) (HCC)   Morbid obesity (HCC)   Type 1 diabetes mellitus with hyperglycemia (HCC)   Toe osteomyelitis, right (HCC)   Constipation   Acute on chronic diastolic CHF (congestive heart failure) (HCC)  Resolved Problems:   Cough  Hospital Course:  [name] is a 59 yo female with history of COPD, asthma, depression, hyperlipemia, recent discharge from the hospital on 11/02/2023 for right hallux osteomyelitis and cellulitis s/p debridement.  She presented to the hospital with shortness of breath, leg edema, paroxysmal nocturnal dyspnea.  Chest x-ray showed multifocal infiltrates.  BNP was elevated, 735.9.     Feels better. Amb without oxygen  Assessment and Plan: Acute on chronic diastolic congestive heart failure. Improved.  She is tolerating room air.  She is ambulating in her room without any symptoms of oxygen desaturation. Continue oral Lasix 20 mg daily at discharge.   Uncontrolled type 1 diabetes with hyperglycemia. Continue insulin via insulin pump at discharge.    Chronic kidney disease stage IIIb. Creatinine is stable.  Recommended outpatient follow-up with nephrologist.  PCP can arrange.   Metabolic acidosis. Hypokalemia. Improved    Class III obesity with BMI 43.55. Diet and excise advised.     Recent toe osteomyelitis. Patient said she recently saw Dr. Excell Seltzer, podiatrist, who gave her refills on her antibiotics.  Continue antibiotics at discharge.    Anemia of chronic disease Probable iron deficiency  anemia as well. Ferritin is 145, iron 13, saturation ratio 5 Outpatient follow-up with PCP for further management     Her condition has improved significantly and she wants to go home today.  She is deemed stable for discharge to home.       Consultants: None Procedures performed: None Disposition: Home Diet recommendation:  Discharge Diet Orders (From admission, onward)     Start     Ordered   11/21/23 0000  Diet - low sodium heart healthy        11/21/23 7829           Cardiac and Carb modified diet DISCHARGE MEDICATION: Allergies as of 11/21/2023       Reactions   Statins Other (See Comments)   Myalgias    Atorvastatin    Muscle and join pains.   Levaquin  [levofloxacin In D5w]    GI upset   Simvastatin    joint aches.   Dilaudid  [hydromorphone Hcl] Hives, Itching, Rash   Tetracycline Rash        Medication List     TAKE these medications    albuterol 108 (90 Base) MCG/ACT inhaler Commonly known as: VENTOLIN HFA Inhale 2 puffs into the lungs every 6 (six) hours as needed for wheezing or shortness of breath.   amitriptyline 25 MG tablet Commonly known as: ELAVIL Take 25 mg by mouth at bedtime as needed (neuropathy pain).   amoxicillin-clavulanate 875-125 MG tablet Commonly known as: AUGMENTIN Take 1 tablet by mouth every 12 (twelve) hours for 7 days.   aspirin 81 MG tablet Take 81 mg by mouth daily.  cetirizine 10 MG tablet Commonly known as: ZYRTEC TAKE 1 TABLET(10 MG) BY MOUTH DAILY   ciprofloxacin 500 MG tablet Commonly known as: CIPRO Take 1 tablet (500 mg total) by mouth 2 (two) times daily for 7 days.   clopidogrel 75 MG tablet Commonly known as: PLAVIX Take 1 tablet (75 mg total) by mouth daily.   Dexcom G6 Sensor Misc SMARTSIG:Topical Every 10 Days   Dexcom G6 Transmitter Misc USE TO MONITOR BLOOD SUGAR. REPLACE EVERY 3 MONTHS   esomeprazole 40 MG capsule Commonly known as: NEXIUM Take 40 mg by mouth daily.   ezetimibe  10 MG tablet Commonly known as: ZETIA Take 1 tablet (10 mg total) by mouth daily.   FLUoxetine 20 MG capsule Commonly known as: PROZAC Take 2 capsules (40 mg total) by mouth daily.   furosemide 20 MG tablet Commonly known as: LASIX Take 1 tablet (20 mg total) by mouth daily.   gabapentin 300 MG capsule Commonly known as: NEURONTIN Take 3 capsules by mouth at bedtime.   HumaLOG KwikPen 200 UNIT/ML KwikPen Generic drug: insulin lispro Inject 80 Units into the skin daily.   Omnipod 5 DexG7G6 Pods Gen 5 Misc SMARTSIG:1 Each SUB-Q Every 3 Days   oxyCODONE 5 MG immediate release tablet Commonly known as: Oxy IR/ROXICODONE Take 1 tablet (5 mg total) by mouth every 4 (four) hours as needed for moderate pain (pain score 4-6).   Semaglutide-Weight Management 0.5 MG/0.5ML Soaj Inject 0.5 mg into the skin once a week.   Stimulant Laxative 8.6-50 MG tablet Generic drug: senna-docusate Take 1 tablet by mouth daily.        Discharge Exam: Filed Weights   11/16/23 1244  Weight: 108 kg    GEN: NAD SKIN: Warm and dry EYES: No pallor or icterus ENT: MMM CV: RRR PULM: CTA B ABD: soft, obese, NT, +BS CNS: AAO x 3, non focal EXT: Chronic b/l leg lymphedema, no tenderness   Condition at discharge: good  The results of significant diagnostics from this hospitalization (including imaging, microbiology, ancillary and laboratory) are listed below for reference.   Imaging Studies: DG Chest Port 1 View Result Date: 11/20/2023 CLINICAL DATA:  Shortness of breath. EXAM: PORTABLE CHEST 1 VIEW COMPARISON:  Chest radiograph dated 11/16/2023. FINDINGS: Similar or slightly progressed right lung opacities consistent with pneumonia. Additional retrocardiac density also noted. No large pleural effusion. No pneumothorax. Stable cardiac silhouette no acute osseous pathology. IMPRESSION: Progression of pulmonary opacities. Continued follow-up recommended. Electronically Signed   By: Elgie Collard M.D.   On: 11/20/2023 09:16   DG Chest 2 View Result Date: 11/16/2023 CLINICAL DATA:  Shortness of breath. EXAM: CHEST - 2 VIEW COMPARISON:  02/19/2023 FINDINGS: There are heterogeneous faint opacities overlying the right hemithorax, compatible with multilobar pneumonia. Follow-up to clearing is recommended. There is also left retrocardiac opacity partially obscuring the left hemidiaphragm and blunting the left posterior costophrenic angle. Bilateral costophrenic angles are clear. Normal cardio-mediastinal silhouette. No acute osseous abnormalities. The soft tissues are within normal limits. IMPRESSION: *Heterogeneous opacities overlying the right lung and in the left retrocardiac region, compatible with multilobar pneumonia. Follow-up to clearing is recommended. There is associated small left pleural effusion. Electronically Signed   By: Jules Schick M.D.   On: 11/16/2023 14:06   MR FOOT RIGHT WO CONTRAST Result Date: 11/02/2023 CLINICAL DATA:  Right great toe and foot pain. Concern for osteomyelitis. EXAM: MRI OF THE RIGHT FOREFOOT WITHOUT CONTRAST TECHNIQUE: Multiplanar, multisequence MR imaging of the right forefoot was  performed. No intravenous contrast was administered. COMPARISON:  Right foot x-rays dated October 31, 2023. FINDINGS: Bones/Joint/Cartilage Prominent marrow edema with corresponding decreased T1 marrow signal involving all of the first distal phalanx. Lesser marrow edema involving distal first proximal phalanx. Remaining marrow signal is unremarkable. No fracture or dislocation. Joint spaces are preserved. No joint effusion. Ligaments Collateral ligaments are intact.  Lisfranc ligament is intact. Muscles and Tendons Flexor and extensor tendons are intact. No tenosynovitis. No muscle edema or atrophy. Soft tissue Dorsal foot soft tissue swelling extending into the great toe. No drainable fluid collection. No soft tissue mass. IMPRESSION: 1. Osteomyelitis of the first distal  phalanx. 2. Lesser marrow edema involving the distal first proximal phalanx, which could reflect early osteomyelitis. 3. Dorsal foot soft tissue swelling extending into the great toe. No drainable abscess. Electronically Signed   By: Obie Dredge M.D.   On: 11/02/2023 11:34   PERIPHERAL VASCULAR CATHETERIZATION Result Date: 11/01/2023 See surgical note for result.  DG Foot Complete Right Result Date: 10/31/2023 CLINICAL DATA:  Right foot pain for 3 weeks without reported injury. EXAM: RIGHT FOOT COMPLETE - 3+ VIEW COMPARISON:  November 29, 2020. FINDINGS: There is lucency seen involving the first distal phalanx concerning for possible osteomyelitis. Joint spaces are intact. Mild posterior calcaneal spurring is noted. Vascular calcifications are noted. IMPRESSION: Findings concerning for osteomyelitis involving first distal phalanx. Electronically Signed   By: Lupita Raider M.D.   On: 10/31/2023 12:23    Microbiology: Results for orders placed or performed during the hospital encounter of 11/16/23  Blood culture (routine x 2)     Status: None   Collection Time: 11/16/23  4:01 PM   Specimen: BLOOD  Result Value Ref Range Status   Specimen Description BLOOD BLOOD RIGHT ARM  Final   Special Requests   Final    BOTTLES DRAWN AEROBIC AND ANAEROBIC Blood Culture results may not be optimal due to an inadequate volume of blood received in culture bottles   Culture   Final    NO GROWTH 5 DAYS Performed at [facility], [address] [place], [place] [address]    Report Status 11/21/2023 FINAL  Final  Blood culture (routine x 2)     Status: None   Collection Time: 11/16/23  4:01 PM   Specimen: BLOOD  Result Value Ref Range Status   Specimen Description BLOOD LEFT ANTECUBITAL  Final   Special Requests   Final    BOTTLES DRAWN AEROBIC AND ANAEROBIC Blood Culture results may not be optimal due to an inadequate volume of blood received in culture bottles   Culture   Final    NO GROWTH 5  DAYS Performed at [facility], [address] [place], [place] [address]    Report Status 11/21/2023 FINAL  Final  Resp panel by RT-PCR (RSV, Flu A&B, Covid) Anterior Nasal Swab     Status: None   Collection Time: 11/16/23  4:27 PM   Specimen: Anterior Nasal Swab  Result Value Ref Range Status   SARS Coronavirus 2 by RT PCR NEGATIVE NEGATIVE Final    Comment: (NOTE) SARS-CoV-2 target nucleic acids are NOT DETECTED.  The SARS-CoV-2 RNA is generally detectable in upper respiratory specimens during the acute phase of infection. The lowest concentration of SARS-CoV-2 viral copies this assay can detect is 138 copies/mL. A negative result does not preclude SARS-Cov-2 infection and should not be used as the sole basis for treatment or other patient management decisions. A negative  result may occur with  improper specimen collection/handling, submission of specimen other than nasopharyngeal swab, presence of viral mutation(s) within the areas targeted by this assay, and inadequate number of viral copies(<138 copies/mL). A negative result must be combined with clinical observations, patient history, and epidemiological information. The expected result is Negative.  Fact Sheet for Patients:  BloggerCourse.com  Fact Sheet for Healthcare Providers:  SeriousBroker.it  This test is no t yet approved or cleared by the [place] FDA and  has been authorized for detection and/or diagnosis of SARS-CoV-2 by FDA under an Emergency Use Authorization (EUA). This EUA will remain  in effect (meaning this test can be used) for the duration of the COVID-19 declaration under Section 564(b)(1) of the Act, 21 U.S.C.section 360bbb-3(b)(1), unless the authorization is terminated  or revoked sooner.       Influenza A by PCR NEGATIVE NEGATIVE Final   Influenza B by PCR NEGATIVE NEGATIVE Final    Comment: (NOTE) The Xpert Xpress  SARS-CoV-2/FLU/RSV plus assay is intended as an aid in the diagnosis of influenza from Nasopharyngeal swab specimens and should not be used as a sole basis for treatment. Nasal washings and aspirates are unacceptable for Xpert Xpress SARS-CoV-2/FLU/RSV testing.  Fact Sheet for Patients: BloggerCourse.com  Fact Sheet for Healthcare Providers: SeriousBroker.it  This test is not yet approved or cleared by the [place] FDA and has been authorized for detection and/or diagnosis of SARS-CoV-2 by FDA under an Emergency Use Authorization (EUA). This EUA will remain in effect (meaning this test can be used) for the duration of the COVID-19 declaration under Section 564(b)(1) of the Act, 21 U.S.C. section 360bbb-3(b)(1), unless the authorization is terminated or revoked.     Resp Syncytial Virus by PCR NEGATIVE NEGATIVE Final    Comment: (NOTE) Fact Sheet for Patients: BloggerCourse.com  Fact Sheet for Healthcare Providers: SeriousBroker.it  This test is not yet approved or cleared by the [place] FDA and has been authorized for detection and/or diagnosis of SARS-CoV-2 by FDA under an Emergency Use Authorization (EUA). This EUA will remain in effect (meaning this test can be used) for the duration of the COVID-19 declaration under Section 564(b)(1) of the Act, 21 U.S.C. section 360bbb-3(b)(1), unless the authorization is terminated or revoked.  Performed at [facility], [address] [place], [place] [address]   Respiratory (~20 pathogens) panel by PCR     Status: None   Collection Time: 11/17/23  3:01 PM   Specimen: Nasopharyngeal Swab; Respiratory  Result Value Ref Range Status   Adenovirus NOT DETECTED NOT DETECTED Final   Coronavirus 229E NOT DETECTED NOT DETECTED Final    Comment: (NOTE) The Coronavirus on the Respiratory Panel, DOES NOT test for the novel   Coronavirus (2019 nCoV)    Coronavirus HKU1 NOT DETECTED NOT DETECTED Final   Coronavirus NL63 NOT DETECTED NOT DETECTED Final   Coronavirus OC43 NOT DETECTED NOT DETECTED Final   Metapneumovirus NOT DETECTED NOT DETECTED Final   Rhinovirus / Enterovirus NOT DETECTED NOT DETECTED Final   Influenza A NOT DETECTED NOT DETECTED Final   Influenza B NOT DETECTED NOT DETECTED Final   Parainfluenza Virus 1 NOT DETECTED NOT DETECTED Final   Parainfluenza Virus 2 NOT DETECTED NOT DETECTED Final   Parainfluenza Virus 3 NOT DETECTED NOT DETECTED Final   Parainfluenza Virus 4 NOT DETECTED NOT DETECTED Final   Respiratory Syncytial Virus NOT DETECTED NOT DETECTED Final   Bordetella pertussis NOT DETECTED NOT DETECTED Final   Bordetella Parapertussis  NOT DETECTED NOT DETECTED Final   Chlamydophila pneumoniae NOT DETECTED NOT DETECTED Final   Mycoplasma pneumoniae NOT DETECTED NOT DETECTED Final    Comment: Performed at [facility], [address] [place], [place] [address]    Labs: CBC: Recent Labs  Lab 11/16/23 1319 11/17/23 0442 11/18/23 0622 11/19/23 0624  WBC 15.2* 18.4* 24.0* 14.3*  HGB 9.0* 9.3* 9.0* 8.6*  HCT 28.3* 30.1* 29.2* 27.9*  MCV 90.1 90.1 89.8 90.3  PLT 336 337 401* 383   Basic Metabolic Panel: Recent Labs  Lab 11/17/23 0442 11/18/23 0622 11/19/23 0624 11/20/23 0554 11/21/23 0532  NA 135 138 139 137 137  K 4.7 3.4* 4.4 4.3 3.9  CL 104 103 105 104 103  CO2 21* 24 26 25 26   GLUCOSE 348* 80 118* 122* 113*  BUN 23* 35* 32* 28* 32*  CREATININE 1.65* 1.66* 1.59* 1.46* 1.63*  CALCIUM 8.0* 8.0* 7.8* 7.8* 8.1*  MG  --  2.4 2.4 2.3 2.3   Liver Function Tests: Recent Labs  Lab 11/16/23 1319  AST 27  ALT 19  ALKPHOS 71  BILITOT 0.4  PROT 7.3  ALBUMIN 3.3*   CBG: Recent Labs  Lab 11/20/23 1219 11/20/23 2205 11/21/23 0201 11/21/23 0345 11/21/23 0730  GLUCAP 138* 90 304* 204* 103*    Discharge time spent: greater than 30  minutes.  Signed: Lurene Shadow, MD [facility] 11/21/2023

## 2023-11-23 ENCOUNTER — Emergency Department: Payer: Medicaid Other

## 2023-11-23 ENCOUNTER — Other Ambulatory Visit: Payer: Self-pay

## 2023-11-23 ENCOUNTER — Inpatient Hospital Stay
Admission: EM | Admit: 2023-11-23 | Discharge: 2023-12-25 | DRG: 291 | Disposition: E | Payer: Medicaid Other | Attending: Student in an Organized Health Care Education/Training Program | Admitting: Student in an Organized Health Care Education/Training Program

## 2023-11-23 ENCOUNTER — Telehealth: Payer: Self-pay | Admitting: Cardiology

## 2023-11-23 DIAGNOSIS — E1051 Type 1 diabetes mellitus with diabetic peripheral angiopathy without gangrene: Secondary | ICD-10-CM | POA: Diagnosis present

## 2023-11-23 DIAGNOSIS — Z6841 Body Mass Index (BMI) 40.0 and over, adult: Secondary | ICD-10-CM | POA: Diagnosis not present

## 2023-11-23 DIAGNOSIS — D631 Anemia in chronic kidney disease: Secondary | ICD-10-CM | POA: Diagnosis present

## 2023-11-23 DIAGNOSIS — I700 Atherosclerosis of aorta: Secondary | ICD-10-CM | POA: Diagnosis present

## 2023-11-23 DIAGNOSIS — Z83438 Family history of other disorder of lipoprotein metabolism and other lipidemia: Secondary | ICD-10-CM

## 2023-11-23 DIAGNOSIS — F418 Other specified anxiety disorders: Secondary | ICD-10-CM | POA: Diagnosis present

## 2023-11-23 DIAGNOSIS — F32A Depression, unspecified: Secondary | ICD-10-CM | POA: Diagnosis present

## 2023-11-23 DIAGNOSIS — Z8261 Family history of arthritis: Secondary | ICD-10-CM

## 2023-11-23 DIAGNOSIS — J9601 Acute respiratory failure with hypoxia: Secondary | ICD-10-CM | POA: Diagnosis present

## 2023-11-23 DIAGNOSIS — F419 Anxiety disorder, unspecified: Secondary | ICD-10-CM | POA: Diagnosis present

## 2023-11-23 DIAGNOSIS — Z5941 Food insecurity: Secondary | ICD-10-CM

## 2023-11-23 DIAGNOSIS — E1069 Type 1 diabetes mellitus with other specified complication: Secondary | ICD-10-CM | POA: Diagnosis present

## 2023-11-23 DIAGNOSIS — N1831 Chronic kidney disease, stage 3a: Secondary | ICD-10-CM | POA: Diagnosis present

## 2023-11-23 DIAGNOSIS — Z881 Allergy status to other antibiotic agents status: Secondary | ICD-10-CM

## 2023-11-23 DIAGNOSIS — Z5986 Financial insecurity: Secondary | ICD-10-CM

## 2023-11-23 DIAGNOSIS — I4901 Ventricular fibrillation: Secondary | ICD-10-CM | POA: Diagnosis not present

## 2023-11-23 DIAGNOSIS — I462 Cardiac arrest due to underlying cardiac condition: Secondary | ICD-10-CM | POA: Diagnosis not present

## 2023-11-23 DIAGNOSIS — Z79899 Other long term (current) drug therapy: Secondary | ICD-10-CM

## 2023-11-23 DIAGNOSIS — Z7985 Long-term (current) use of injectable non-insulin antidiabetic drugs: Secondary | ICD-10-CM

## 2023-11-23 DIAGNOSIS — D509 Iron deficiency anemia, unspecified: Secondary | ICD-10-CM | POA: Diagnosis present

## 2023-11-23 DIAGNOSIS — Z7902 Long term (current) use of antithrombotics/antiplatelets: Secondary | ICD-10-CM | POA: Diagnosis not present

## 2023-11-23 DIAGNOSIS — Z87891 Personal history of nicotine dependence: Secondary | ICD-10-CM

## 2023-11-23 DIAGNOSIS — J4520 Mild intermittent asthma, uncomplicated: Secondary | ICD-10-CM | POA: Diagnosis not present

## 2023-11-23 DIAGNOSIS — J45909 Unspecified asthma, uncomplicated: Secondary | ICD-10-CM | POA: Diagnosis present

## 2023-11-23 DIAGNOSIS — E66813 Obesity, class 3: Secondary | ICD-10-CM | POA: Diagnosis present

## 2023-11-23 DIAGNOSIS — N183 Chronic kidney disease, stage 3 unspecified: Secondary | ICD-10-CM | POA: Diagnosis present

## 2023-11-23 DIAGNOSIS — Z833 Family history of diabetes mellitus: Secondary | ICD-10-CM

## 2023-11-23 DIAGNOSIS — Z794 Long term (current) use of insulin: Secondary | ICD-10-CM | POA: Diagnosis not present

## 2023-11-23 DIAGNOSIS — I2510 Atherosclerotic heart disease of native coronary artery without angina pectoris: Secondary | ICD-10-CM | POA: Diagnosis present

## 2023-11-23 DIAGNOSIS — E1022 Type 1 diabetes mellitus with diabetic chronic kidney disease: Secondary | ICD-10-CM | POA: Diagnosis present

## 2023-11-23 DIAGNOSIS — K219 Gastro-esophageal reflux disease without esophagitis: Secondary | ICD-10-CM | POA: Diagnosis present

## 2023-11-23 DIAGNOSIS — M868X7 Other osteomyelitis, ankle and foot: Secondary | ICD-10-CM | POA: Diagnosis present

## 2023-11-23 DIAGNOSIS — Z885 Allergy status to narcotic agent status: Secondary | ICD-10-CM

## 2023-11-23 DIAGNOSIS — Z7982 Long term (current) use of aspirin: Secondary | ICD-10-CM | POA: Diagnosis not present

## 2023-11-23 DIAGNOSIS — I5023 Acute on chronic systolic (congestive) heart failure: Secondary | ICD-10-CM | POA: Diagnosis present

## 2023-11-23 DIAGNOSIS — M869 Osteomyelitis, unspecified: Secondary | ICD-10-CM | POA: Diagnosis not present

## 2023-11-23 DIAGNOSIS — Z9641 Presence of insulin pump (external) (internal): Secondary | ICD-10-CM | POA: Diagnosis present

## 2023-11-23 DIAGNOSIS — Z841 Family history of disorders of kidney and ureter: Secondary | ICD-10-CM

## 2023-11-23 DIAGNOSIS — Z888 Allergy status to other drugs, medicaments and biological substances status: Secondary | ICD-10-CM

## 2023-11-23 DIAGNOSIS — I509 Heart failure, unspecified: Principal | ICD-10-CM

## 2023-11-23 DIAGNOSIS — E1042 Type 1 diabetes mellitus with diabetic polyneuropathy: Secondary | ICD-10-CM | POA: Diagnosis present

## 2023-11-23 DIAGNOSIS — J4489 Other specified chronic obstructive pulmonary disease: Secondary | ICD-10-CM | POA: Diagnosis present

## 2023-11-23 DIAGNOSIS — E785 Hyperlipidemia, unspecified: Secondary | ICD-10-CM | POA: Diagnosis present

## 2023-11-23 DIAGNOSIS — I130 Hypertensive heart and chronic kidney disease with heart failure and stage 1 through stage 4 chronic kidney disease, or unspecified chronic kidney disease: Principal | ICD-10-CM | POA: Diagnosis present

## 2023-11-23 DIAGNOSIS — Z8249 Family history of ischemic heart disease and other diseases of the circulatory system: Secondary | ICD-10-CM

## 2023-11-23 DIAGNOSIS — I10 Essential (primary) hypertension: Secondary | ICD-10-CM | POA: Diagnosis present

## 2023-11-23 LAB — CBC
HCT: 27.5 % — ABNORMAL LOW (ref 36.0–46.0)
Hemoglobin: 8.6 g/dL — ABNORMAL LOW (ref 12.0–15.0)
MCH: 28 pg (ref 26.0–34.0)
MCHC: 31.3 g/dL (ref 30.0–36.0)
MCV: 89.6 fL (ref 80.0–100.0)
Platelets: 443 10*3/uL — ABNORMAL HIGH (ref 150–400)
RBC: 3.07 MIL/uL — ABNORMAL LOW (ref 3.87–5.11)
RDW: 14.6 % (ref 11.5–15.5)
WBC: 18 10*3/uL — ABNORMAL HIGH (ref 4.0–10.5)
nRBC: 0 % (ref 0.0–0.2)

## 2023-11-23 LAB — BASIC METABOLIC PANEL
Anion gap: 11 (ref 5–15)
BUN: 23 mg/dL — ABNORMAL HIGH (ref 6–20)
CO2: 26 mmol/L (ref 22–32)
Calcium: 8.2 mg/dL — ABNORMAL LOW (ref 8.9–10.3)
Chloride: 95 mmol/L — ABNORMAL LOW (ref 98–111)
Creatinine, Ser: 1.44 mg/dL — ABNORMAL HIGH (ref 0.44–1.00)
GFR, Estimated: 42 mL/min — ABNORMAL LOW (ref >60)
Glucose, Bld: 186 mg/dL — ABNORMAL HIGH (ref 70–99)
Potassium: 3.8 mmol/L (ref 3.5–5.1)
Sodium: 132 mmol/L — ABNORMAL LOW (ref 135–145)

## 2023-11-23 LAB — BRAIN NATRIURETIC PEPTIDE: B Natriuretic Peptide: 1160.9 pg/mL — ABNORMAL HIGH (ref 0.0–100.0)

## 2023-11-23 LAB — PROCALCITONIN: Procalcitonin: 0.28 ng/mL

## 2023-11-23 LAB — CBG MONITORING, ED: Glucose-Capillary: 220 mg/dL — ABNORMAL HIGH (ref 70–99)

## 2023-11-23 MED ORDER — DOCUSATE SODIUM 100 MG PO CAPS: 100.0000 mg | ORAL_CAPSULE | Freq: Two times a day (BID) | ORAL | Status: DC | PRN

## 2023-11-23 MED ORDER — ENOXAPARIN SODIUM 60 MG/0.6ML IJ SOSY
0.5000 mg/kg | PREFILLED_SYRINGE | INTRAMUSCULAR | Status: DC
  Administered 2023-11-23 – 2023-11-24 (×2): 55 mg via SUBCUTANEOUS
  Filled 2023-11-23 (×2): qty 0.6

## 2023-11-23 MED ORDER — FLUOXETINE HCL 20 MG PO CAPS
40.0000 mg | ORAL_CAPSULE | Freq: Every day | ORAL | Status: DC
  Administered 2023-11-23 – 2023-11-24 (×2): 40 mg via ORAL
  Filled 2023-11-23 (×2): qty 2

## 2023-11-23 MED ORDER — INSULIN PUMP
Freq: Three times a day (TID) | SUBCUTANEOUS | Status: DC
  Filled 2023-11-23: qty 1

## 2023-11-23 MED ORDER — ENOXAPARIN SODIUM 40 MG/0.4ML IJ SOSY: 40.0000 mg | PREFILLED_SYRINGE | INTRAMUSCULAR | Status: DC

## 2023-11-23 MED ORDER — HYDRALAZINE HCL 20 MG/ML IJ SOLN: 5.0000 mg | INTRAMUSCULAR | Status: DC | PRN

## 2023-11-23 MED ORDER — IPRATROPIUM-ALBUTEROL 0.5-2.5 (3) MG/3ML IN SOLN
3.0000 mL | Freq: Once | RESPIRATORY_TRACT | Status: AC
Start: 2023-11-23 — End: 2023-11-23
  Administered 2023-11-23: 3 mL via RESPIRATORY_TRACT
  Filled 2023-11-23: qty 3

## 2023-11-23 MED ORDER — EZETIMIBE 10 MG PO TABS
10.0000 mg | ORAL_TABLET | Freq: Every day | ORAL | Status: DC
  Administered 2023-11-23 – 2023-11-24 (×2): 10 mg via ORAL
  Filled 2023-11-23 (×2): qty 1

## 2023-11-23 MED ORDER — OXYCODONE HCL 5 MG PO TABS
5.0000 mg | ORAL_TABLET | ORAL | Status: DC | PRN
  Administered 2023-11-23 – 2023-11-25 (×2): 5 mg via ORAL
  Filled 2023-11-23 (×2): qty 1

## 2023-11-23 MED ORDER — LORATADINE 10 MG PO TABS
10.0000 mg | ORAL_TABLET | Freq: Every day | ORAL | Status: DC
  Administered 2023-11-23 – 2023-11-24 (×2): 10 mg via ORAL
  Filled 2023-11-23 (×2): qty 1

## 2023-11-23 MED ORDER — CLOPIDOGREL BISULFATE 75 MG PO TABS
75.0000 mg | ORAL_TABLET | Freq: Every day | ORAL | Status: DC
  Administered 2023-11-24: 75 mg via ORAL
  Filled 2023-11-23: qty 1

## 2023-11-23 MED ORDER — ONDANSETRON HCL 4 MG/2ML IJ SOLN: 4.0000 mg | Freq: Three times a day (TID) | INTRAMUSCULAR | Status: DC | PRN

## 2023-11-23 MED ORDER — FUROSEMIDE 10 MG/ML IJ SOLN
40.0000 mg | Freq: Two times a day (BID) | INTRAMUSCULAR | Status: DC
  Administered 2023-11-24 (×2): 40 mg via INTRAVENOUS
  Filled 2023-11-23 (×2): qty 4

## 2023-11-23 MED ORDER — PANTOPRAZOLE SODIUM 40 MG PO TBEC
40.0000 mg | DELAYED_RELEASE_TABLET | Freq: Every day | ORAL | Status: DC
  Administered 2023-11-23 – 2023-11-24 (×2): 40 mg via ORAL
  Filled 2023-11-23 (×2): qty 1

## 2023-11-23 MED ORDER — DM-GUAIFENESIN ER 30-600 MG PO TB12: 1.0000 | ORAL_TABLET | Freq: Two times a day (BID) | ORAL | Status: DC | PRN

## 2023-11-23 MED ORDER — IOHEXOL 350 MG/ML SOLN
75.0000 mL | Freq: Once | INTRAVENOUS | Status: AC | PRN
  Administered 2023-11-23: 75 mL via INTRAVENOUS

## 2023-11-23 MED ORDER — GABAPENTIN 300 MG PO CAPS
900.0000 mg | ORAL_CAPSULE | Freq: Every day | ORAL | Status: DC
  Administered 2023-11-23 – 2023-11-24 (×2): 900 mg via ORAL
  Filled 2023-11-23 (×2): qty 3

## 2023-11-23 MED ORDER — ACETAMINOPHEN 325 MG PO TABS
650.0000 mg | ORAL_TABLET | Freq: Four times a day (QID) | ORAL | Status: DC | PRN
  Administered 2023-11-25: 650 mg via ORAL
  Filled 2023-11-23: qty 2

## 2023-11-23 MED ORDER — ALBUTEROL SULFATE (2.5 MG/3ML) 0.083% IN NEBU: 2.5000 mg | INHALATION_SOLUTION | RESPIRATORY_TRACT | Status: DC | PRN

## 2023-11-23 MED ORDER — AMOXICILLIN-POT CLAVULANATE 875-125 MG PO TABS
1.0000 | ORAL_TABLET | Freq: Two times a day (BID) | ORAL | Status: DC
  Administered 2023-11-23 – 2023-11-24 (×3): 1 via ORAL
  Filled 2023-11-23 (×3): qty 1

## 2023-11-23 MED ORDER — IPRATROPIUM-ALBUTEROL 0.5-2.5 (3) MG/3ML IN SOLN
3.0000 mL | Freq: Four times a day (QID) | RESPIRATORY_TRACT | Status: DC
  Administered 2023-11-23 – 2023-11-25 (×5): 3 mL via RESPIRATORY_TRACT
  Filled 2023-11-23 (×7): qty 3

## 2023-11-23 MED ORDER — FUROSEMIDE 10 MG/ML IJ SOLN
40.0000 mg | Freq: Once | INTRAMUSCULAR | Status: AC
  Administered 2023-11-23: 40 mg via INTRAVENOUS
  Filled 2023-11-23: qty 4

## 2023-11-23 MED ORDER — ASPIRIN 81 MG PO TBEC
81.0000 mg | DELAYED_RELEASE_TABLET | Freq: Every day | ORAL | Status: DC
  Administered 2023-11-23 – 2023-11-24 (×2): 81 mg via ORAL
  Filled 2023-11-23 (×2): qty 1

## 2023-11-23 NOTE — Progress Notes (Signed)
 PHARMACIST - PHYSICIAN COMMUNICATION  CONCERNING:  Enoxaparin (Lovenox) for DVT Prophylaxis    RECOMMENDATION: Patient was prescribed enoxaprin 40mg  q24 hours for VTE prophylaxis.   Filed Weights   11/23/23 1703  Weight: 112 kg (247 lb)    Body mass index is 45.18 kg/m.  Estimated Creatinine Clearance: 50.4 mL/min (A) (by C-G formula based on SCr of 1.44 mg/dL (H)).   Based on [facility] policy patient is candidate for enoxaparin 0.5mg /kg TBW SQ every 24 hours based on BMI being >30.  DESCRIPTION: Pharmacy has adjusted enoxaparin dose per [facility] policy.  Patient is now receiving enoxaparin 55 mg every 24 hours    Foye Deer, PharmD Clinical Pharmacist  11/23/2023 8:59 PM

## 2023-11-23 NOTE — ED Notes (Signed)
 Admitting MD at bedside.

## 2023-11-23 NOTE — ED Notes (Signed)
 Lab called and on way to collect samples.

## 2023-11-23 NOTE — ED Provider Notes (Signed)
 [facility] Provider Note    Event Date/Time   First MD Initiated Contact with Patient 11/23/23 1831     (approximate)   History   Shortness of Breath   HPI Emily Collins is a 58 y.o. female with history of COPD, asthma, depression, HLD presenting today for shortness of breath.  Patient states over the last 24 hours she had worsening shortness of breath and inability to walk significant distance.  Occasional cough which is nonproductive.  Worsening swelling in her legs.  Denies any fevers, chills, congestion.  No chest pain, nausea, vomiting.  Has been taking antibiotics for her osteomyelitis as prescribed.  Chart review: Patient discharged 2 days ago following admission for multifocal pneumonia along with elevated BNP level.  Recently started on Lasix and continuing to have increased swelling.     Physical Exam   Triage Vital Signs: ED Triage Vitals [11/23/23 1703]  Encounter Vitals Group     BP (!) 119/56     Systolic BP Percentile      Diastolic BP Percentile      Pulse Rate 98     Resp (!) 22     Temp 98 F (36.7 C)     Temp src      SpO2 (!) 88 %     Weight 247 lb (112 kg)     Height 5\' 2"  (1.575 m)     Head Circumference      Peak Flow      Pain Score 0     Pain Loc      Pain Education      Exclude from Growth Chart     Most recent vital signs: Vitals:   11/23/23 1850 11/23/23 1900  BP: 132/66 137/63  Pulse: (!) 47 99  Resp: (!) 27 14  Temp:    SpO2: 97% 92%   Physical Exam: I have reviewed the vital signs and nursing notes. General: Awake, alert, no acute distress.  Nontoxic appearing. Head:  Atraumatic, normocephalic.   ENT:  EOM intact, PERRL. Oral mucosa is pink and moist with no lesions. Neck: Neck is supple with full range of motion, No meningeal signs. Cardiovascular:  RRR, No murmurs. Peripheral pulses palpable and equal bilaterally. Respiratory:  Symmetrical chest wall expansion.  Mild tachypnea on exam  especially with any movement.  Crackles noted throughout.  Trace expiratory wheeze present but not consistent. Musculoskeletal:  No cyanosis. 1+ pitting edema to bilateral lower extremities. Moving extremities with full ROM Abdomen:  Soft, nontender, nondistended. Neuro:  GCS 15, moving all four extremities, interacting appropriately. Speech clear. Psych:  Calm, appropriate.   Skin:  Warm, dry, no rash.    ED Results / Procedures / Treatments   Labs (all labs ordered are listed, but only abnormal results are displayed) Labs Reviewed  BASIC METABOLIC PANEL - Abnormal; Notable for the following components:      Result Value   Sodium 132 (*)    Chloride 95 (*)    Glucose, Bld 186 (*)    BUN 23 (*)    Creatinine, Ser 1.44 (*)    Calcium 8.2 (*)    GFR, Estimated 42 (*)    All other components within normal limits  CBC - Abnormal; Notable for the following components:   WBC 18.0 (*)    RBC 3.07 (*)    Hemoglobin 8.6 (*)    HCT 27.5 (*)    Platelets 443 (*)    All other components within  normal limits  BRAIN NATRIURETIC PEPTIDE - Abnormal; Notable for the following components:   B Natriuretic Peptide 1,160.9 (*)    All other components within normal limits  PROCALCITONIN     EKG My EKG interpretation: Rate of 98, normal sinus rhythm.  Right axis deviation.  Normal intervals.  No acute ST elevations or depressions   RADIOLOGY Independently interpreted chest x-ray as well as CTA chest showing worsening pulmonary edema throughout in comparison to most recent admission.  No evidence of PE   PROCEDURES:  Critical Care performed: Yes, see critical care procedure note(s)  .Critical Care  Performed by: Janith Lima, MD Authorized by: Janith Lima, MD   Critical care provider statement:    Critical care time (minutes):  30   Critical care was necessary to treat or prevent imminent or life-threatening deterioration of the following conditions:  Respiratory failure    Critical care was time spent personally by me on the following activities:  Development of treatment plan with patient or surrogate, discussions with consultants, evaluation of patient's response to treatment, examination of patient, ordering and review of laboratory studies, ordering and review of radiographic studies, ordering and performing treatments and interventions, pulse oximetry, re-evaluation of patient's condition and review of old charts   I assumed direction of critical care for this patient from another provider in my specialty: no     Care discussed with: admitting provider      MEDICATIONS ORDERED IN ED: Medications  ipratropium-albuterol (DUONEB) 0.5-2.5 (3) MG/3ML nebulizer solution 3 mL (3 mLs Nebulization Given 11/23/23 1946)  furosemide (LASIX) injection 40 mg (40 mg Intravenous Given 11/23/23 1945)  iohexol (OMNIPAQUE) 350 MG/ML injection 75 mL (75 mLs Intravenous Contrast Given 11/23/23 1934)     IMPRESSION / MDM / ASSESSMENT AND PLAN / ED COURSE  I reviewed the triage vital signs and the nursing notes.                              Differential diagnosis includes, but is not limited to, COVID, flu, RSV, pneumonia, pneumothorax, worsening CHF, asthma exacerbation  Patient's presentation is most consistent with acute presentation with potential threat to life or bodily function.  Patient is a 58 year old female presenting today for worsening shortness of breath following recent admission for CHF exacerbation.  She is hypoxic on room air and when attempting to sit her up, she gets notably tachypneic as well as hypoxic to 86%.  Mostly noting rhonchi throughout all lung fields on exam but trace wheezing.  Patient given DuoNeb and also given Lasix given unclear immediate etiology of shortness of breath between CHF and potential asthma.  Otherwise not meeting sepsis criteria at rest.  Leukocytosis elevated from most recent admission.  Hemoglobin stable.  Notably her BNP is  significantly elevated from her recent admission.  She has had worsening edema to her legs.  Chest x-ray and then follow-up CTA chest showed concern for worsening pulmonary edema suggesting CHF as a source of her symptoms.  Given her hypoxia with worsening pulmonary edema, will admit to hospitalist for further care.  The patient is on the cardiac monitor to evaluate for evidence of arrhythmia and/or significant heart rate changes. Clinical Course as of 11/23/23 2020  Fri Nov 23, 2023  1959 Attempted to turn off oxygen.  When oxygen was removed and patient sat up, she desatted to 86% with tachypnea near 30.  Placed back on 2 L. [DW]  Clinical Course User Index [DW] Janith Lima, MD     FINAL CLINICAL IMPRESSION(S) / ED DIAGNOSES   Final diagnoses:  Acute on chronic congestive heart failure, unspecified heart failure type (HCC)  Acute hypoxic respiratory failure (HCC)     Rx / DC Orders   ED Discharge Orders     None        Note:  This document was prepared using Dragon voice recognition software and may include unintentional dictation errors.   Janith Lima, MD 11/23/23 2020

## 2023-11-23 NOTE — H&P (Signed)
 History and Physical    Emily Collins:[id number] DOB: 1966-02-08 DOA: 11/23/2023  Referring MD/NP/PA:   PCP: Oswaldo Conroy, MD   Patient coming from:  The patient is coming from home.     Chief Complaint: SOB  HPI: Emily Collins is a 58 y.o. 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  Patient was recently hospitalized from 2/21 - 2/26 due to multilobar pneumonia and right great toe osteomyelitis.  Patient was discharged on Augmentin and Cipro until 11/28/2023.  Patient does not have foot pain, fever or chills.  She states that she developed shortness of breath since yesterday, which has been progressively worsening.  Patient has dry cough, no chest pain, fever or chills.  Patient was found to have oxygen desaturation to 88% on room air, which improved to 92-93% on 2 L oxygen.  She has bilateral leg edema.  No nausea, vomiting, diarrhea or abdominal pain no symptoms of UTI.   Data reviewed independently and ED Course: pt was found to have BNP 1160, WBC 18.0, stable renal function, temperature normal, blood pressure 119/56, heart rate of 47 -- > 100s, RR 27.  CT is negative for PE, with findings consistent with CHF.  Patient is admitted to telemetry bed as inpatient.   Chest x-ray: 1. No evidence of pulmonary embolism. Mild limitations as detailed above. 2. Small bilateral pleural effusions with diffuse, heterogeneous ground-glass and less so airspace opacities. Given concurrent septal thickening and clinical history, findings are all likely related to congestive heart failure. Superimposed atypical infection cannot be excluded. 3. Mild thoracic adenopathy, typical in the setting of congestive heart failure. If indicated, this could be re-evaluated with chest CT at 6 months to confirm  resolution. 4. Age advanced coronary artery atherosclerosis. Recommend assessment of coronary risk factors. 5.  Aortic Atherosclerosis (ICD10-I70.0).    CTA:  Slight interval progression of lung opacities.    EKG: I have personally reviewed.  Sinus rhythm, QTc 400, low voltage, nonspecific T wave change.   Review of Systems:   General: no fevers, chills, no body weight gain, has fatigue HEENT: no blurry vision, hearing changes or sore throat Respiratory: has dyspnea, coughing, no wheezing CV: no chest pain, no palpitations GI: no nausea, vomiting, abdominal pain, diarrhea, constipation GU: no dysuria, burning on urination, increased urinary frequency, hematuria  Ext: has leg edema Neuro: no unilateral weakness, numbness, or tingling, no vision change or hearing loss Skin: no rash, no skin tear. MSK: No muscle spasm, no deformity, no limitation of range of movement in spin Heme: No easy bruising.  Travel history: No recent long distant travel.   Allergy:  Allergies  Allergen Reactions   Statins Other (See Comments)    Myalgias    Atorvastatin     Muscle and join pains.   Levaquin  [Levofloxacin In D5w]     GI upset   Simvastatin     joint aches.   Dilaudid  [Hydromorphone Hcl] Hives, Itching and Rash   Tetracycline Rash    Past Medical History:  Diagnosis Date   Allergy    Anxiety    Arthritis    Asthma    Depression    Diabetes mellitus without complication (HCC)    GERD (gastroesophageal reflux disease)    History of chicken pox 04/07/2015   DID have Chicken Pox.  Hyperlipidemia    Hypertension    Neuromuscular disorder (HCC)    Venous (peripheral) insufficiency     Past Surgical History:  Procedure Laterality Date   APPENDECTOMY  1979   APPLICATION OF WOUND VAC Right 01/07/2021   Procedure: APPLICATION OF WOUND VAC;  Surgeon: Edwin Cap, DPM;  Location: [facility];  Service: Podiatry;  Laterality: Right;   BILATERAL CARPAL TUNNEL RELEASE  Bilateral    Dr. Rosita Kea   CESAREAN SECTION  2003   GRAFT APPLICATION Right 01/07/2021   Procedure: GRAFT APPLICATION;  Surgeon: Edwin Cap, DPM;  Location: [facility];  Service: Podiatry;  Laterality: Right;   INCISION AND DRAINAGE Right 12/01/2020   Procedure: INCISION AND DRAINAGE;  Surgeon: Edwin Cap, DPM;  Location: [facility];  Service: Podiatry;  Laterality: Right;   INCISION AND DRAINAGE Right 12/07/2020   Procedure: INCISION AND DRAINAGE;  Surgeon: [name], DPM;  Location: [facility];  Service: Podiatry;  Laterality: Right;   INCISION AND DRAINAGE OF WOUND Right 01/07/2021   Procedure: IRRIGATION AND DEBRIDEMENT WOUND;  Surgeon: Edwin Cap, DPM;  Location: [facility];  Service: Podiatry;  Laterality: Right;  regional block if possible   LAPAROSCOPIC LYSIS OF ADHESIONS     LOWER EXTREMITY ANGIOGRAPHY Right 11/01/2023   Procedure: Lower Extremity Angiography;  Surgeon: Annice Needy, MD;  Location: [facility];  Service: Cardiovascular;  Laterality: Right;   SHOULDER SURGERY Right 08/2009   Dr. Rosita Kea   TUBAL LIGATION      Social History:  reports that she quit smoking about 6 years ago. Her smoking use included cigarettes. She started smoking about 38 years ago. She has a 32.5 pack-year smoking history. She has never used smokeless tobacco. She reports current alcohol use of about 1.0 - 2.0 standard drink of alcohol per week. She reports that she does not use drugs.  Family History:  Family History  Problem Relation Age of Onset   Hypertension Mother    Diabetes Mother    Hyperlipidemia Mother    Arthritis Mother    Heart disease Father    Kidney disease Father      Prior to Admission medications   Medication Sig Start Date End Date Taking? Authorizing Provider  albuterol (VENTOLIN HFA) 108 (90 Base) MCG/ACT inhaler Inhale 2 puffs into the lungs every 6 (six) hours as needed for wheezing or shortness of breath. Patient not taking: Reported on 11/19/2023 08/25/22    Concha Se, MD  amitriptyline (ELAVIL) 25 MG tablet Take 25 mg by mouth at bedtime as needed (neuropathy pain).    [provider]  amoxicillin-clavulanate (AUGMENTIN) 875-125 MG tablet Take 1 tablet by mouth every 12 (twelve) hours for 7 days. 11/21/23 11/28/23  Lurene Shadow, MD  aspirin 81 MG tablet Take 81 mg by mouth daily.    [provider]  cetirizine (ZYRTEC) 10 MG tablet TAKE 1 TABLET(10 MG) BY MOUTH DAILY 05/16/17   Malva Limes, MD  ciprofloxacin (CIPRO) 500 MG tablet Take 1 tablet (500 mg total) by mouth 2 (two) times daily for 7 days. 11/21/23 11/28/23  Lurene Shadow, MD  clopidogrel (PLAVIX) 75 MG tablet Take 1 tablet (75 mg total) by mouth daily. 11/03/23 01/02/24  Tresa Moore, MD  Continuous Glucose Sensor (DEXCOM G6 SENSOR) MISC SMARTSIG:Topical Every 10 Days    [provider]  Continuous Glucose Transmitter (DEXCOM G6 TRANSMITTER) MISC USE TO MONITOR BLOOD SUGAR. REPLACE EVERY 3 MONTHS 04/10/23   [provider]  esomeprazole (NEXIUM) 40 MG capsule Take 40 mg by mouth daily. 07/24/23   [provider]  ezetimibe (ZETIA) 10 MG tablet Take 1 tablet (10 mg total) by mouth daily. 07/12/23 11/19/23  Debbe Odea, MD  FLUoxetine (PROZAC) 20 MG capsule Take 2 capsules (40 mg total) by mouth daily. 11/21/23   Lurene Shadow, MD  furosemide (LASIX) 20 MG tablet Take 1 tablet (20 mg total) by mouth daily. 07/12/23 11/01/23  Debbe Odea, MD  gabapentin (NEURONTIN) 300 MG capsule Take 3 capsules by mouth at bedtime. 10/30/23   [provider]  HUMALOG KWIKPEN 200 UNIT/ML KwikPen Inject 80 Units into the skin daily. 09/24/23   [provider]  Insulin Disposable Pump (OMNIPOD 5 DEXG7G6 PODS GEN 5) MISC SMARTSIG:1 Each SUB-Q Every 3 Days    [provider]  oxyCODONE (OXY IR/ROXICODONE) 5 MG immediate release tablet Take 1 tablet (5 mg total) by mouth every 4 (four) hours as needed for moderate pain (pain  score 4-6). 11/02/23   Tresa Moore, MD  Semaglutide-Weight Management 0.5 MG/0.5ML SOAJ Inject 0.5 mg into the skin once a week. 10/04/23   [provider]  senna-docusate (SENOKOT-S) 8.6-50 MG tablet Take 1 tablet by mouth daily. 11/02/23   Tresa Moore, MD    Physical Exam: Vitals:   11/23/23 1845 11/23/23 1850 11/23/23 1900 11/23/23 2130  BP:  132/66 137/63 (!) 118/91  Pulse: 97 (!) 47 99 (!) 101  Resp: 19 (!) 27 14 18   Temp:      SpO2: 96% 97% 92% 100%  Weight:      Height:       General: Not in acute distress HEENT:       Eyes: PERRL, EOMI, no jaundice       ENT: No discharge from the ears and nose, no pharynx injection, no tonsillar enlargement.        Neck: Difficult to assess JVD due to morbid obesity, no bruit, no mass felt. Heme: No neck lymph node enlargement. Cardiac: S1/S2, RRR, No murmurs, No gallops or rubs. Respiratory: Has crackles bilaterally GI: Soft, nondistended, nontender, no rebound pain, no organomegaly, BS present. GU: No hematuria Ext: 1+ pitting leg edema bilaterally. 1+DP/PT pulse bilaterally. Musculoskeletal: No joint deformities, No joint redness or warmth, no limitation of ROM in spin. Skin: No rashes.  Neuro: Alert, oriented X3, cranial nerves II-XII grossly intact, moves all extremities normally Psych: Patient is not psychotic, no suicidal or hemocidal ideation.  Labs on Admission: I have personally reviewed following labs and imaging studies  CBC: Recent Labs  Lab 11/17/23 0442 11/18/23 0622 11/19/23 0624 11/23/23 1716  WBC 18.4* 24.0* 14.3* 18.0*  HGB 9.3* 9.0* 8.6* 8.6*  HCT 30.1* 29.2* 27.9* 27.5*  MCV 90.1 89.8 90.3 89.6  PLT 337 401* 383 443*   Basic Metabolic Panel: Recent Labs  Lab 11/18/23 0622 11/19/23 0624 11/20/23 0554 11/21/23 0532 11/23/23 1716  NA 138 139 137 137 132*  K 3.4* 4.4 4.3 3.9 3.8  CL 103 105 104 103 95*  CO2 24 26 25 26 26   GLUCOSE 80 118* 122* 113* 186*  BUN 35* 32* 28* 32* 23*   CREATININE 1.66* 1.59* 1.46* 1.63* 1.44*  CALCIUM 8.0* 7.8* 7.8* 8.1* 8.2*  MG 2.4 2.4 2.3 2.3  --    GFR: Estimated Creatinine Clearance: 50.4 mL/min (A) (by C-G formula based on SCr of 1.44 mg/dL (H)). Liver Function Tests: No results for input(s): "AST", "ALT", "ALKPHOS", "BILITOT", "PROT", "  ALBUMIN" in the last 168 hours. No results for input(s): "LIPASE", "AMYLASE" in the last 168 hours. No results for input(s): "AMMONIA" in the last 168 hours. Coagulation Profile: No results for input(s): "INR", "PROTIME" in the last 168 hours. Cardiac Enzymes: No results for input(s): "CKTOTAL", "CKMB", "CKMBINDEX", "TROPONINI" in the last 168 hours. BNP (last 3 results) No results for input(s): "PROBNP" in the last 8760 hours. HbA1C: No results for input(s): "HGBA1C" in the last 72 hours. CBG: Recent Labs  Lab 11/20/23 2205 11/21/23 0201 11/21/23 0345 11/21/23 0730 11/23/23 2124  GLUCAP 90 304* 204* 103* 220*   Lipid Profile: No results for input(s): "CHOL", "HDL", "LDLCALC", "TRIG", "CHOLHDL", "LDLDIRECT" in the last 72 hours. Thyroid Function Tests: No results for input(s): "TSH", "T4TOTAL", "FREET4", "T3FREE", "THYROIDAB" in the last 72 hours. Anemia Panel: No results for input(s): "VITAMINB12", "FOLATE", "FERRITIN", "TIBC", "IRON", "RETICCTPCT" in the last 72 hours. Urine analysis:    Component Value Date/Time   COLORURINE YELLOW (A) 04/26/2019 1650   APPEARANCEUR CLEAR (A) 04/26/2019 1650   LABSPEC 1.013 04/26/2019 1650   PHURINE 7.0 04/26/2019 1650   GLUCOSEU >=500 (A) 04/26/2019 1650   HGBUR MODERATE (A) 04/26/2019 1650   BILIRUBINUR NEGATIVE 04/26/2019 1650   KETONESUR 5 (A) 04/26/2019 1650   PROTEINUR NEGATIVE 04/26/2019 1650   NITRITE NEGATIVE 04/26/2019 1650   LEUKOCYTESUR SMALL (A) 04/26/2019 1650   Sepsis Labs: @LABRCNTIP (procalcitonin:4,lacticidven:4) ) Recent Results (from the past 240 hours)  Blood culture (routine x 2)     Status: None   Collection  Time: 11/16/23  4:01 PM   Specimen: BLOOD  Result Value Ref Range Status   Specimen Description BLOOD BLOOD RIGHT ARM  Final   Special Requests   Final    BOTTLES DRAWN AEROBIC AND ANAEROBIC Blood Culture results may not be optimal due to an inadequate volume of blood received in culture bottles   Culture   Final    NO GROWTH 5 DAYS Performed at [facility], [address] [place], [place] [address]    Report Status 11/21/2023 FINAL  Final  Blood culture (routine x 2)     Status: None   Collection Time: 11/16/23  4:01 PM   Specimen: BLOOD  Result Value Ref Range Status   Specimen Description BLOOD LEFT ANTECUBITAL  Final   Special Requests   Final    BOTTLES DRAWN AEROBIC AND ANAEROBIC Blood Culture results may not be optimal due to an inadequate volume of blood received in culture bottles   Culture   Final    NO GROWTH 5 DAYS Performed at [facility], [address] [place], [place] [address]    Report Status 11/21/2023 FINAL  Final  Resp panel by RT-PCR (RSV, Flu A&B, Covid) Anterior Nasal Swab     Status: None   Collection Time: 11/16/23  4:27 PM   Specimen: Anterior Nasal Swab  Result Value Ref Range Status   SARS Coronavirus 2 by RT PCR NEGATIVE NEGATIVE Final    Comment: (NOTE) SARS-CoV-2 target nucleic acids are NOT DETECTED.  The SARS-CoV-2 RNA is generally detectable in upper respiratory specimens during the acute phase of infection. The lowest concentration of SARS-CoV-2 viral copies this assay can detect is 138 copies/mL. A negative result does not preclude SARS-Cov-2 infection and should not be used as the sole basis for treatment or other patient management decisions. A negative result may occur with  improper specimen collection/handling, submission of specimen other than nasopharyngeal swab, presence of viral mutation(s) within  the areas targeted by this assay, and inadequate number of viral copies(<138 copies/mL). A negative result must  be combined with clinical observations, patient history, and epidemiological information. The expected result is Negative.  Fact Sheet for Patients:  BloggerCourse.com  Fact Sheet for Healthcare Providers:  SeriousBroker.it  This test is no t yet approved or cleared by the [place] FDA and  has been authorized for detection and/or diagnosis of SARS-CoV-2 by FDA under an Emergency Use Authorization (EUA). This EUA will remain  in effect (meaning this test can be used) for the duration of the COVID-19 declaration under Section 564(b)(1) of the Act, 21 U.S.C.section 360bbb-3(b)(1), unless the authorization is terminated  or revoked sooner.       Influenza A by PCR NEGATIVE NEGATIVE Final   Influenza B by PCR NEGATIVE NEGATIVE Final    Comment: (NOTE) The Xpert Xpress SARS-CoV-2/FLU/RSV plus assay is intended as an aid in the diagnosis of influenza from Nasopharyngeal swab specimens and should not be used as a sole basis for treatment. Nasal washings and aspirates are unacceptable for Xpert Xpress SARS-CoV-2/FLU/RSV testing.  Fact Sheet for Patients: BloggerCourse.com  Fact Sheet for Healthcare Providers: SeriousBroker.it  This test is not yet approved or cleared by the [place] FDA and has been authorized for detection and/or diagnosis of SARS-CoV-2 by FDA under an Emergency Use Authorization (EUA). This EUA will remain in effect (meaning this test can be used) for the duration of the COVID-19 declaration under Section 564(b)(1) of the Act, 21 U.S.C. section 360bbb-3(b)(1), unless the authorization is terminated or revoked.     Resp Syncytial Virus by PCR NEGATIVE NEGATIVE Final    Comment: (NOTE) Fact Sheet for Patients: BloggerCourse.com  Fact Sheet for Healthcare Providers: SeriousBroker.it  This test is not  yet approved or cleared by the [place] FDA and has been authorized for detection and/or diagnosis of SARS-CoV-2 by FDA under an Emergency Use Authorization (EUA). This EUA will remain in effect (meaning this test can be used) for the duration of the COVID-19 declaration under Section 564(b)(1) of the Act, 21 U.S.C. section 360bbb-3(b)(1), unless the authorization is terminated or revoked.  Performed at [facility], [address] [place], [place] [address]   Respiratory (~20 pathogens) panel by PCR     Status: None   Collection Time: 11/17/23  3:01 PM   Specimen: Nasopharyngeal Swab; Respiratory  Result Value Ref Range Status   Adenovirus NOT DETECTED NOT DETECTED Final   Coronavirus 229E NOT DETECTED NOT DETECTED Final    Comment: (NOTE) The Coronavirus on the Respiratory Panel, DOES NOT test for the novel  Coronavirus (2019 nCoV)    Coronavirus HKU1 NOT DETECTED NOT DETECTED Final   Coronavirus NL63 NOT DETECTED NOT DETECTED Final   Coronavirus OC43 NOT DETECTED NOT DETECTED Final   Metapneumovirus NOT DETECTED NOT DETECTED Final   Rhinovirus / Enterovirus NOT DETECTED NOT DETECTED Final   Influenza A NOT DETECTED NOT DETECTED Final   Influenza B NOT DETECTED NOT DETECTED Final   Parainfluenza Virus 1 NOT DETECTED NOT DETECTED Final   Parainfluenza Virus 2 NOT DETECTED NOT DETECTED Final   Parainfluenza Virus 3 NOT DETECTED NOT DETECTED Final   Parainfluenza Virus 4 NOT DETECTED NOT DETECTED Final   Respiratory Syncytial Virus NOT DETECTED NOT DETECTED Final   Bordetella pertussis NOT DETECTED NOT DETECTED Final   Bordetella Parapertussis NOT DETECTED NOT DETECTED Final   Chlamydophila pneumoniae NOT DETECTED NOT DETECTED Final   Mycoplasma pneumoniae NOT DETECTED  NOT DETECTED Final    Comment: Performed at [facility], [address] [place], [place] [address]     Radiological Exams on Admission:   Assessment/Plan Principal Problem:   Acute on  chronic systolic CHF (congestive heart failure) (HCC) Active Problems:   Asthma   Iron deficiency anemia   Obesity, Class III, BMI 40-49.9 (morbid obesity) (HCC)   Hyperlipidemia   DM type 1 with diabetic peripheral neuropathy (HCC)   Depression with anxiety   CKD (chronic kidney disease), stage IIIa   Toe osteomyelitis, right (HCC)   HTN (hypertension)   Assessment and Plan:  Acute on chronic systolic CHF (congestive heart failure) (HCC): 2D echo on 08/09/2023 showed EF 60 to 65% with grade 1 diastolic dysfunction.  Patient has a 1+ leg edema, significantly elevated BNP 1160, clinically consistent with CHF exacerbation.  Patient has 2 L new oxygen requirement, but no respiratory distress.  -Will admit to tele bed as inpatient -Lasix 40 mg bid by IV -Daily weights -strict I/O's -Low salt diet -Fluid restriction -As needed bronchodilators for shortness of breath  Asthma -Bronchodilators and as needed Mucinex  Iron deficiency anemia: Hemoglobin stable 8.6 (8.6 on 11/19/2023) -Follow-up with CBC  Obesity, Class III, BMI 40-49.9 (morbid obesity) (HCC): Body weight 112 kg, BMI 45.18 -Encourage to lose weight -Exercise and healthy diet  Hyperlipidemia -Zetia  DM type 1 with diabetic peripheral neuropathy [facility]): Recent A1c 8.2, poorly controlled.  Patient is using insulin pump. -Ordered insulin pump protocol -Patient promised me that her son will bring her insulin pump to the hospital as soon as possible tonight  Depression with anxiety -Continue home medications  CKD (chronic kidney disease), stage IIIa: Stable renal function.  Recent creatinine 1.63 on 11/20/2022.  Her creatinine is 1.44, BUN 23, GFR 42 -Follow up with CBC  Recent great toe osteomyelitis, right, and multilobar pneumonia: WBC 18.0, but no fever. -Continue home Augmentin and Cipro onto 11/28/2023  HTN (hypertension) -IV hydralazine as needed -Patient is on IV Lasix       DVT ppx: SQ Lovenox  Code  Status: Full code   Family Communication: Husband is at the bedside  Disposition Plan:  Anticipate discharge back to previous environment  Consults called:  none  Admission status and Level of care: Telemetry Cardiac:   as inpt        Dispo: The patient is from: Home              Anticipated d/c is to: Home              Anticipated d/c date is: 2 days              Patient currently is not medically stable to d/c.    Severity of Illness:  The appropriate patient status for this patient is INPATIENT. Inpatient status is judged to be reasonable and necessary in order to provide the required intensity of service to ensure the patient's safety. The patient's presenting symptoms, physical exam findings, and initial radiographic and laboratory data in the context of their chronic comorbidities is felt to place them at high risk for further clinical deterioration. Furthermore, it is not anticipated that the patient will be medically stable for discharge from the hospital within 2 midnights of admission.   * I certify that at the point of admission it is my clinical judgment that the patient will require inpatient hospital care spanning beyond 2 midnights from the point of admission due to high intensity  of service, high risk for further deterioration and high frequency of surveillance required.*       Date of Service 11/23/2023    Lorretta Harp [facility]   If 7PM-7AM, please contact night-coverage [email or web address] 11/23/2023, 10:38 PM

## 2023-11-23 NOTE — Telephone Encounter (Signed)
 STAT call to triage  Pt difficult to understand, pronounced SOB, recent hx of admission for PNX, pt advised to proceed to ED alternatively this RN offered to  call 911 for pt  Pt reports her husband is there and will take her to ED

## 2023-11-23 NOTE — ED Triage Notes (Addendum)
 Pt comes with sob that has been ongoing. Pt states she was recently released from hospital and dx with CHF. Pt states increase sob. Pt denies any pain.   Pt states increased swelling. Pt was placed on lasix at home.   Pt currently 88% on RA. Pt placed on 2L [place] and O2 now at 92%

## 2023-11-23 NOTE — Telephone Encounter (Signed)
 Pt c/o Shortness Of Breath: STAT if SOB developed within the last 24 hours or pt is noticeably SOB on the phone  1. Are you currently SOB (can you hear that pt is SOB on the phone)? yes  2. How long have you been experiencing SOB?  Going on the second week, really short of breath  3. Are you SOB when sitting or when up moving around? Both, but worse laying down moving around  4. Are you currently experiencing any other symptoms? really weak- patient wants to be seen asap by somebody

## 2023-11-23 NOTE — ED Notes (Signed)
 Pt assisted to the bedside commode and back to bed. No other needs at this time.

## 2023-11-23 NOTE — ED Notes (Signed)
Pt given sandwich tray and beverage. 

## 2023-11-24 DIAGNOSIS — I5023 Acute on chronic systolic (congestive) heart failure: Secondary | ICD-10-CM | POA: Diagnosis not present

## 2023-11-24 LAB — BASIC METABOLIC PANEL
Anion gap: 12 (ref 5–15)
BUN: 28 mg/dL — ABNORMAL HIGH (ref 6–20)
CO2: 24 mmol/L (ref 22–32)
Calcium: 7.9 mg/dL — ABNORMAL LOW (ref 8.9–10.3)
Chloride: 96 mmol/L — ABNORMAL LOW (ref 98–111)
Creatinine, Ser: 1.74 mg/dL — ABNORMAL HIGH (ref 0.44–1.00)
GFR, Estimated: 34 mL/min — ABNORMAL LOW (ref >60)
Glucose, Bld: 365 mg/dL — ABNORMAL HIGH (ref 70–99)
Potassium: 3.8 mmol/L (ref 3.5–5.1)
Sodium: 132 mmol/L — ABNORMAL LOW (ref 135–145)

## 2023-11-24 LAB — CBC
HCT: 25.3 % — ABNORMAL LOW (ref 36.0–46.0)
Hemoglobin: 8 g/dL — ABNORMAL LOW (ref 12.0–15.0)
MCH: 27.4 pg (ref 26.0–34.0)
MCHC: 31.6 g/dL (ref 30.0–36.0)
MCV: 86.6 fL (ref 80.0–100.0)
Platelets: 405 10*3/uL — ABNORMAL HIGH (ref 150–400)
RBC: 2.92 MIL/uL — ABNORMAL LOW (ref 3.87–5.11)
RDW: 14.8 % (ref 11.5–15.5)
WBC: 18.7 10*3/uL — ABNORMAL HIGH (ref 4.0–10.5)
nRBC: 0 % (ref 0.0–0.2)

## 2023-11-24 LAB — CBG MONITORING, ED
Glucose-Capillary: 336 mg/dL — ABNORMAL HIGH (ref 70–99)
Glucose-Capillary: 118 mg/dL — ABNORMAL HIGH (ref 70–99)
Glucose-Capillary: 140 mg/dL — ABNORMAL HIGH (ref 70–99)
Glucose-Capillary: 164 mg/dL — ABNORMAL HIGH (ref 70–99)

## 2023-11-24 LAB — MAGNESIUM: Magnesium: 2.3 mg/dL (ref 1.7–2.4)

## 2023-11-24 NOTE — TOC Initial Note (Signed)
 Transition of Care [facility]) - Initial/Assessment Note    Patient Details  Name: Emily Collins MRN: [id number] Date of Birth: November 28, 1965  Transition of Care [facility]) CM/SW Contact:    Liliana Cline, LCSW Phone Number: 11/24/2023, 9:02 AM  Clinical Narrative:                 Patient known to [facility] for recent admission. See below readmission assessment from 11/19/23:  "Readmission prevention screen complete. CSW met with patient. No family at bedside. CSW introduced role and explained that discharge planning would be discussed. PCP is Tonia Ghent, MD. Patient drives herself to appointments or her husband takes her. [facility] [place] but [facility] will need to get her discharge medications at [facility] because she does not get her check until 2/26. Pharmacist is aware. Patient lives home with her husband and two kids. Her mother and oldest son live next door. No home health or DME use prior to admission. Patient is currently on 2.5 L acute oxygen. Will follow for this potential discharge need. No further concerns. CSW will continue to follow patient for support and facilitate return home once stable. Her husband and oldest son will transport her home at discharge. "  Expected Discharge Plan: Home/Self Care Barriers to Discharge: Continued Medical Work up   Patient Goals and CMS Choice            Expected Discharge Plan and Services       Living arrangements for the past 2 months: Single Family Home                                      Prior Living Arrangements/Services Living arrangements for the past 2 months: Single Family Home Lives with:: Spouse, Adult Children              Current home services: DME    Activities of Daily Living      Permission Sought/Granted                  Emotional Assessment              Admission diagnosis:  Acute on chronic systolic CHF (congestive heart failure) (HCC) [I50.23] Acute on chronic diastolic  CHF (congestive heart failure) (HCC) [I50.33] Patient Active Problem List   Diagnosis Date Noted   Acute on chronic systolic CHF (congestive heart failure) (HCC) 11/23/2023   HTN (hypertension) 11/23/2023   Iron deficiency anemia 11/23/2023   Acute on chronic diastolic CHF (congestive heart failure) (HCC) 11/17/2023   Multilobar lung infiltrate 11/16/2023   Constipation 11/16/2023   Osteomyelitis (HCC) 10/31/2023   Toe osteomyelitis, right (HCC) 10/31/2023   Swelling of limb 05/08/2023   Lymphedema 05/08/2023   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)    Diabetic infection of right foot (HCC) 01/02/2021   Cellulitis of right lower extremity 01/02/2021   Diabetic ulcer of right foot (HCC) 01/02/2021   Asthma 01/02/2021   Depression with anxiety 01/02/2021   CKD (chronic kidney disease), stage IIIa 01/02/2021   Normocytic anemia 01/02/2021   Wound infection_right ankle 01/02/2021   Aortic atherosclerosis (HCC) 12/13/2020   Thrombocytosis    Anemia of chronic disease    Leukocytosis    Hyperkalemia    Acute kidney injury superimposed on CKD (HCC)    Obesity, Class III, BMI 40-49.9 (morbid obesity) (HCC)  Cellulitis and abscess of foot, except toes    Right leg pain    Ulcer of right heel and midfoot with fat layer exposed (HCC)    Abscess of right leg    Uncontrolled type 2 diabetes mellitus with hyperglycemia (HCC)    Cellulitis 11/29/2020   Morbid obesity (HCC) 08/18/2020   DM type 1 with diabetic peripheral neuropathy (HCC) 06/09/2020   Sepsis (HCC) 04/26/2019   Asthma, intermittent 02/04/2018   Generalized anxiety disorder 02/04/2018   Type 1 diabetes mellitus with hyperglycemia (HCC) 02/04/2018   Arthritis 05/10/2016   Chronic kidney disease 04/05/2016   Allergic rhinitis 04/07/2015   Depression 04/07/2015   Type 1 diabetes mellitus with hypoglycemia without coma (HCC) 04/07/2015   Diabetic retinopathy (HCC) 04/07/2015   Dysesthesia 04/07/2015    Family history of early CAD 04/07/2015   GERD (gastroesophageal reflux disease) 04/07/2015   Hyperlipidemia 04/07/2015   Insomnia 04/07/2015   Tobacco abuse 04/07/2015   Venous insufficiency 04/07/2015   Anxiety 04/07/2015   PCP:  Oswaldo Conroy, MD Pharmacy:   [phone] [place], [place] - [address] AT [facility] [address] [place] [address] Phone: [phone] Fax: [phone]  [facility] [address] [place] [address] Phone: [phone] Fax: [phone]     Social Drivers of Health (SDOH) Social History: SDOH Screenings   Food Insecurity: No Food Insecurity (11/16/2023)  Recent Concern: Food Insecurity - Food Insecurity Present (11/12/2023)   Received from [facility]  Housing: Low Risk  (11/16/2023)  Transportation Needs: No Transportation Needs (11/16/2023)  Utilities: Not At Risk (11/16/2023)  Depression (PHQ2-9): Low Risk  (11/13/2023)  Financial Resource Strain: Medium Risk (11/12/2023)   Received from [facility]  Tobacco Use: Medium Risk (11/23/2023)   SDOH Interventions:     Readmission Risk Interventions    11/19/2023    3:00 PM  Readmission Risk Prevention Plan  Transportation Screening Complete  PCP or Specialist Appt within 3-5 Days Complete  Social Work Consult for Recovery Care Planning/Counseling Complete  Palliative Care Screening Not Applicable  Medication Review Oceanographer) Complete

## 2023-11-24 NOTE — ED Notes (Signed)
 Pt weaned to Room Air. Pt o2 saturations 87% on RA. Pt states she feels short of breath. Pt placed back on [place] 2LPM. MD notified.

## 2023-11-24 NOTE — Progress Notes (Signed)
 PROGRESS NOTE  Vidalia Serpas    DOB: 1966/02/18, 58 y.o.  KGM:[id number]    Code Status: Full Code   DOA: 11/23/2023   LOS: 1   Brief hospital course  Rylie Khanh Cordner is a 58 y.o. 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   Patient was recently hospitalized from 2/21 - 2/26 due to multilobar pneumonia and right great toe osteomyelitis.    ED Course: pt was found to have BNP 1160, WBC 18.0, stable renal function, temperature normal, blood pressure 119/56, heart rate of 47 -- > 100s, RR 27.  CT is negative for PE, with findings consistent with CHF.  Patient is admitted to telemetry bed as inpatient.     Chest x-ray: 1. No evidence of pulmonary embolism. Mild limitations as detailed above. 2. Small bilateral pleural effusions with diffuse, heterogeneous ground-glass and less so airspace opacities. Given concurrent septal thickening and clinical history, findings are all likely related to congestive heart failure. Superimposed atypical infection cannot be excluded. 3. Mild thoracic adenopathy, typical in the setting of congestive heart failure. If indicated, this could be re-evaluated with chest CT at 6 months to confirm resolution. 4. Age advanced coronary artery atherosclerosis. Recommend assessment of coronary risk factors. 5.  Aortic Atherosclerosis (ICD10-I70.0).   CTA:  Slight interval progression of lung opacities.    UYQ:IHKVQ rhythm, QTc 400, low voltage, nonspecific T wave change.   Patient was admitted to medicine service for further workup and management of hypoxia as outlined in detail below.  11/24/23 -stable  Assessment & Plan  Principal Problem:   Acute on chronic systolic CHF (congestive heart failure) (HCC) Active Problems:   Asthma   Iron deficiency anemia   Obesity, Class III,  BMI 40-49.9 (morbid obesity) (HCC)   Hyperlipidemia   DM type 1 with diabetic peripheral neuropathy (HCC)   Depression with anxiety   CKD (chronic kidney disease), stage IIIa   Toe osteomyelitis, right (HCC)   HTN (hypertension)  Acute on chronic systolic CHF (congestive heart failure) (HCC): 2D echo on 08/09/2023 showed EF 60 to 65% with grade 1 diastolic dysfunction.  Patient has a 1+ leg edema, significantly elevated BNP 1160, clinically consistent with CHF exacerbation. On2-3L. She was only taking 20mg  daily lasix which seems was not adequately treating. Will likely need to increase at dc.  -Lasix 40 mg bid by IV -Daily weights -strict I/O's -Low salt diet -Fluid restriction -As needed bronchodilators for shortness of breath - wean O2 as tolerated   Asthma -Bronchodilators and as needed Mucinex   Iron deficiency anemia: Hemoglobin stable 8.6 (8.6 on 11/19/2023) -Follow-up with CBC   Obesity, Class III, BMI 40-49.9 (morbid obesity) (HCC): Body weight 112 kg, BMI 45.18 -Encourage to lose weight -Exercise and healthy diet   Hyperlipidemia -Zetia   DM type 1 with diabetic peripheral neuropathy [facility]): Recent A1c 8.2, poorly controlled.  Patient is using insulin pump. -Ordered insulin pump protocol   Depression with anxiety -Continue home medications   CKD (chronic kidney disease), stage IIIa: Stable renal function.    Recent great toe osteomyelitis, right, and multilobar pneumonia: WBC 18.0, but no fever. -Continue home Augmentin and Cipro onto 11/28/2023   Body mass index is 45.18 kg/m.  VTE ppx: lovenox  Diet:     Diet   Diet heart healthy/carb modified Room service appropriate? Yes; Fluid consistency:  Thin; Fluid restriction: 1500 mL Fluid   Consultants: None   Subjective 11/24/23    Pt reports feeling improved since presentation. Continues to have SOB with minimal exertion   Objective   Vitals:   11/24/23 0530 11/24/23 0600 11/24/23 0630 11/24/23 0638   BP: 130/66 123/80 125/67   Pulse: 88 85 95   Resp: 18 17 16    Temp:    98.5 F (36.9 C)  TempSrc:    Oral  SpO2: 92% 90% 95%   Weight:      Height:       No intake or output data in the 24 hours ending 11/24/23 0800 Filed Weights   11/23/23 1703  Weight: 112 kg     Physical Exam:  General: awake, alert, NAD HEENT: atraumatic, clear conjunctiva, anicteric sclera, MMM, hearing grossly normal Respiratory: normal respiratory effort. Rales present Cardiovascular: quick capillary refill, normal S1/S2, RRR, no JVD, murmurs Gastrointestinal: soft, NT, ND Nervous: A&O x3. no gross focal neurologic deficits, normal speech Extremities: moves all equally, 1+ LE edema, normal tone Skin: dry, intact, normal temperature, normal color. No rashes, lesions or ulcers on exposed skin Psychiatry: normal mood, congruent affect  Labs   I have personally reviewed the following labs and imaging studies CBC    Component Value Date/Time   WBC 18.7 (H) 11/24/2023 0314   RBC 2.92 (L) 11/24/2023 0314   HGB 8.0 (L) 11/24/2023 0314   HCT 25.3 (L) 11/24/2023 0314   PLT 405 (H) 11/24/2023 0314   MCV 86.6 11/24/2023 0314   MCH 27.4 11/24/2023 0314   MCHC 31.6 11/24/2023 0314   RDW 14.8 11/24/2023 0314   LYMPHSABS 2.1 11/13/2023 0945   MONOABS 0.6 11/13/2023 0945   EOSABS 1.0 (H) 11/13/2023 0945   BASOSABS 0.1 11/13/2023 0945      Latest Ref Rng & Units 11/24/2023    3:14 AM 11/23/2023    5:16 PM 11/21/2023    5:32 AM  BMP  Glucose 70 - 99 mg/dL 782  956  213   BUN 6 - 20 mg/dL 28  23  32   Creatinine 0.44 - 1.00 mg/dL 0.86  5.78  4.69   Sodium 135 - 145 mmol/L 132  132  137   Potassium 3.5 - 5.1 mmol/L 3.8  3.8  3.9   Chloride 98 - 111 mmol/L 96  95  103   CO2 22 - 32 mmol/L 24  26  26    Calcium 8.9 - 10.3 mg/dL 7.9  8.2  8.1     CT Angio Chest PE W and/or Wo Contrast Result Date: 11/23/2023 CLINICAL DATA:  Shortness of breath.  History of CHF. EXAM: CT ANGIOGRAPHY CHEST WITH CONTRAST  TECHNIQUE: Multidetector CT imaging of the chest was performed using the standard protocol during bolus administration of intravenous contrast. Multiplanar CT image reconstructions and MIPs were obtained to evaluate the vascular anatomy. RADIATION DOSE REDUCTION: This exam was performed according to the departmental dose-optimization program which includes automated exposure control, adjustment of the mA and/or kV according to patient size and/or use of iterative reconstruction technique. CONTRAST:  75mL OMNIPAQUE IOHEXOL 350 MG/ML SOLN COMPARISON:  Plain films including earlier today. FINDINGS: Cardiovascular: The quality of this exam for evaluation of pulmonary embolism is moderate to good. Although the bolus is well timed, limitations based on mild motion and patient body habitus. No pulmonary embolism to the segmental level. Aortic atherosclerosis. Moderate cardiomegaly, without pericardial effusion. Three vessel coronary artery calcification. Mediastinum/Nodes: Middle and anterior mediastinal  adenopathy. Example prevascular 1.3 cm node on 52/4. Mild bilateral hilar adenopathy, with a right hilar node measuring 1.3 cm on 58/4. Lungs/Pleura: Small bilateral pleural effusions. Smooth septal thickening is most apparent at the lung bases. Bilateral patchy areas of ground-glass and less so airspace opacity with relative sparing of the left lung base. Upper Abdomen: Normal imaged portions of the liver, spleen, stomach, pancreas, left adrenal gland, right kidney. Mild left renal cortical thinning. Mild right adrenal thickening. Musculoskeletal: No acute osseous abnormality. Review of the MIP images confirms the above findings. IMPRESSION: 1. No evidence of pulmonary embolism. Mild limitations as detailed above. 2. Small bilateral pleural effusions with diffuse, heterogeneous ground-glass and less so airspace opacities. Given concurrent septal thickening and clinical history, findings are all likely related to  congestive heart failure. Superimposed atypical infection cannot be excluded. 3. Mild thoracic adenopathy, typical in the setting of congestive heart failure. If indicated, this could be re-evaluated with chest CT at 6 months to confirm resolution. 4. Age advanced coronary artery atherosclerosis. Recommend assessment of coronary risk factors. 5.  Aortic Atherosclerosis (ICD10-I70.0). Electronically Signed   By: Jeronimo Greaves M.D.   On: 11/23/2023 19:57   DG Chest 2 View Result Date: 11/23/2023 CLINICAL DATA:  Shortness of breath. EXAM: CHEST - 2 VIEW COMPARISON:  Chest radiograph dated 11/20/2023. FINDINGS: Bilateral pulmonary opacities, progressed in the left upper lung. No large pleural effusion. No pneumothorax. Stable cardiac silhouette. No acute osseous pathology. IMPRESSION: Slight interval progression of lung opacities. Continued follow-up recommended. Electronically Signed   By: Elgie Collard M.D.   On: 11/23/2023 17:41    Disposition Plan & Communication  Patient status: Inpatient  Admitted From: Home Planned disposition location: Home Anticipated discharge date: 3/2 pending oxygen weaning  Family Communication: none at bedside    Author: Leeroy Bock, DO [facility] 11/24/2023, 8:00 AM   Available by Epic secure chat 7AM-7PM. If 7PM-7AM, please contact night-coverage.  [facility] contact information found on [email or web address].

## 2023-11-24 NOTE — ED Notes (Signed)
 Pt's  bumped up to 3L while sleeping.

## 2023-11-24 DEATH — deceased

## 2023-11-25 ENCOUNTER — Other Ambulatory Visit: Payer: Self-pay

## 2023-11-25 ENCOUNTER — Encounter: Payer: Self-pay | Admitting: Internal Medicine

## 2023-11-25 LAB — GLUCOSE, CAPILLARY: Glucose-Capillary: 114 mg/dL — ABNORMAL HIGH (ref 70–99)

## 2023-11-25 MED ORDER — PNEUMOCOCCAL 20-VAL CONJ VACC 0.5 ML IM SUSY: 0.5000 mL | PREFILLED_SYRINGE | INTRAMUSCULAR | Status: DC

## 2023-12-13 ENCOUNTER — Ambulatory Visit (INDEPENDENT_AMBULATORY_CARE_PROVIDER_SITE_OTHER): Payer: Medicaid Other | Admitting: Nurse Practitioner

## 2023-12-13 ENCOUNTER — Encounter (INDEPENDENT_AMBULATORY_CARE_PROVIDER_SITE_OTHER): Payer: Medicaid Other

## 2023-12-13 ENCOUNTER — Ambulatory Visit: Payer: Medicaid Other | Admitting: Infectious Diseases

## 2023-12-25 NOTE — Death Summary Note (Signed)
 DEATH SUMMARY   Patient Details  Name: Emily Collins MRN: [id number] DOB: 07/18/1966 OAC:[id number], Emily Lagos, MD Admission/Discharge Information   Admit Date:  December 20, 2023  Date of Death: Date of Death: Dec 22, 2023  Time of Death: Time of Death: 52  Length of Stay: 2   Principle Cause of death: ventricular fibrillation   Hospital Diagnoses: Principal Problem:   Acute on chronic systolic CHF (congestive heart failure) (HCC) Active Problems:   Asthma   Iron deficiency anemia   Obesity, Class III, BMI 40-49.9 (morbid obesity) (HCC)   Hyperlipidemia   DM type 1 with diabetic peripheral neuropathy (HCC)   Depression with anxiety   CKD (chronic kidney disease), stage IIIa   Toe osteomyelitis, right (HCC)   HTN (hypertension)   Hospital Course: Emily Collins is a 58 y.o. 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   Patient was recently hospitalized from 2/21 - 2/26 due to multilobar pneumonia and right great toe osteomyelitis.    ED Course: pt was found to have BNP 1160, WBC 18.0, stable renal function, temperature normal, blood pressure 119/56, heart rate of 47 -- > 100s, RR 27.  CT is negative for PE, with findings consistent with CHF.  Patient is admitted to telemetry bed as inpatient.    CTA: 1. No evidence of pulmonary embolism. Mild limitations as detailed above. 2. Small bilateral pleural effusions with diffuse, heterogeneous ground-glass and less so airspace opacities. Given concurrent septal thickening and clinical history, findings are all likely related to congestive heart failure. Superimposed atypical infection cannot be excluded. 3. Mild thoracic adenopathy, typical in the setting of congestive heart failure. If indicated, this could be re-evaluated with chest CT at 6 months to  confirm resolution. 4. Age advanced coronary artery atherosclerosis. Recommend assessment of coronary risk factors. 5.  Aortic Atherosclerosis (ICD10-I70.0).   Cxray:  Slight interval progression of lung opacities.    KZS:WFUXN rhythm, QTc 400, low voltage, nonspecific T wave change.  Procedures: none   Consultations: none   Patient was treated for CHF exacerbation with diuretics. She responded well with good UOP and able to wean from 3Lnc to 2Lnc.  Suspect that her home dose of diuretics was insufficient which is what caused her to return with close reoccurrence of CHF exacerbation.   Early morning hours of 3/2, patient unexpectedly went into vfib. After a code blue was run as documented in separate notes by the providers who were involved, she expired.  Family was notified.   The results of significant diagnostics from this hospitalization (including imaging, microbiology, ancillary and laboratory) are listed below for reference.   Significant Diagnostic Studies: CT Angio Chest PE W and/or Wo Contrast Result Date: 12/20/23 CLINICAL DATA:  Shortness of breath.  History of CHF. EXAM: CT ANGIOGRAPHY CHEST WITH CONTRAST TECHNIQUE: Multidetector CT imaging of the chest was performed using the standard protocol during bolus administration of intravenous contrast. Multiplanar CT image reconstructions and MIPs were obtained to evaluate the vascular anatomy. RADIATION DOSE REDUCTION: This exam was performed according to the departmental dose-optimization program which includes automated exposure control, adjustment of the mA and/or kV according to patient size and/or use of iterative reconstruction technique. CONTRAST:  75mL OMNIPAQUE IOHEXOL 350 MG/ML SOLN COMPARISON:  Plain films including earlier today. FINDINGS: Cardiovascular: The quality of this exam for evaluation of pulmonary embolism is moderate to  good. Although the bolus is well timed, limitations based on mild motion and patient body  habitus. No pulmonary embolism to the segmental level. Aortic atherosclerosis. Moderate cardiomegaly, without pericardial effusion. Three vessel coronary artery calcification. Mediastinum/Nodes: Middle and anterior mediastinal adenopathy. Example prevascular 1.3 cm node on 52/4. Mild bilateral hilar adenopathy, with a right hilar node measuring 1.3 cm on 58/4. Lungs/Pleura: Small bilateral pleural effusions. Smooth septal thickening is most apparent at the lung bases. Bilateral patchy areas of ground-glass and less so airspace opacity with relative sparing of the left lung base. Upper Abdomen: Normal imaged portions of the liver, spleen, stomach, pancreas, left adrenal gland, right kidney. Mild left renal cortical thinning. Mild right adrenal thickening. Musculoskeletal: No acute osseous abnormality. Review of the MIP images confirms the above findings. IMPRESSION: 1. No evidence of pulmonary embolism. Mild limitations as detailed above. 2. Small bilateral pleural effusions with diffuse, heterogeneous ground-glass and less so airspace opacities. Given concurrent septal thickening and clinical history, findings are all likely related to congestive heart failure. Superimposed atypical infection cannot be excluded. 3. Mild thoracic adenopathy, typical in the setting of congestive heart failure. If indicated, this could be re-evaluated with chest CT at 6 months to confirm resolution. 4. Age advanced coronary artery atherosclerosis. Recommend assessment of coronary risk factors. 5.  Aortic Atherosclerosis (ICD10-I70.0). Electronically Signed   By: Jeronimo Greaves M.D.   On: 11/23/2023 19:57   DG Chest 2 View Result Date: 11/23/2023 CLINICAL DATA:  Shortness of breath. EXAM: CHEST - 2 VIEW COMPARISON:  Chest radiograph dated 11/20/2023. FINDINGS: Bilateral pulmonary opacities, progressed in the left upper lung. No large pleural effusion. No pneumothorax. Stable cardiac silhouette. No acute osseous pathology. IMPRESSION:  Slight interval progression of lung opacities. Continued follow-up recommended. Electronically Signed   By: Elgie Collard M.D.   On: 11/23/2023 17:41   DG Chest Port 1 View Result Date: 11/20/2023 CLINICAL DATA:  Shortness of breath. EXAM: PORTABLE CHEST 1 VIEW COMPARISON:  Chest radiograph dated 11/16/2023. FINDINGS: Similar or slightly progressed right lung opacities consistent with pneumonia. Additional retrocardiac density also noted. No large pleural effusion. No pneumothorax. Stable cardiac silhouette no acute osseous pathology. IMPRESSION: Progression of pulmonary opacities. Continued follow-up recommended. Electronically Signed   By: Elgie Collard M.D.   On: 11/20/2023 09:16   DG Chest 2 View Result Date: 11/16/2023 CLINICAL DATA:  Shortness of breath. EXAM: CHEST - 2 VIEW COMPARISON:  02/19/2023 FINDINGS: There are heterogeneous faint opacities overlying the right hemithorax, compatible with multilobar pneumonia. Follow-up to clearing is recommended. There is also left retrocardiac opacity partially obscuring the left hemidiaphragm and blunting the left posterior costophrenic angle. Bilateral costophrenic angles are clear. Normal cardio-mediastinal silhouette. No acute osseous abnormalities. The soft tissues are within normal limits. IMPRESSION: *Heterogeneous opacities overlying the right lung and in the left retrocardiac region, compatible with multilobar pneumonia. Follow-up to clearing is recommended. There is associated small left pleural effusion. Electronically Signed   By: Jules Schick M.D.   On: 11/16/2023 14:06   MR FOOT RIGHT WO CONTRAST Result Date: 11/02/2023 CLINICAL DATA:  Right great toe and foot pain. Concern for osteomyelitis. EXAM: MRI OF THE RIGHT FOREFOOT WITHOUT CONTRAST TECHNIQUE: Multiplanar, multisequence MR imaging of the right forefoot was performed. No intravenous contrast was administered. COMPARISON:  Right foot x-rays dated October 31, 2023. FINDINGS:  Bones/Joint/Cartilage Prominent marrow edema with corresponding decreased T1 marrow signal involving all of the first distal phalanx. Lesser marrow edema involving distal first proximal phalanx. Remaining marrow signal  is unremarkable. No fracture or dislocation. Joint spaces are preserved. No joint effusion. Ligaments Collateral ligaments are intact.  Lisfranc ligament is intact. Muscles and Tendons Flexor and extensor tendons are intact. No tenosynovitis. No muscle edema or atrophy. Soft tissue Dorsal foot soft tissue swelling extending into the great toe. No drainable fluid collection. No soft tissue mass. IMPRESSION: 1. Osteomyelitis of the first distal phalanx. 2. Lesser marrow edema involving the distal first proximal phalanx, which could reflect early osteomyelitis. 3. Dorsal foot soft tissue swelling extending into the great toe. No drainable abscess. Electronically Signed   By: Obie Dredge M.D.   On: 11/02/2023 11:34   PERIPHERAL VASCULAR CATHETERIZATION Result Date: 11/01/2023 See surgical note for result.  DG Foot Complete Right Result Date: 10/31/2023 CLINICAL DATA:  Right foot pain for 3 weeks without reported injury. EXAM: RIGHT FOOT COMPLETE - 3+ VIEW COMPARISON:  November 29, 2020. FINDINGS: There is lucency seen involving the first distal phalanx concerning for possible osteomyelitis. Joint spaces are intact. Mild posterior calcaneal spurring is noted. Vascular calcifications are noted. IMPRESSION: Findings concerning for osteomyelitis involving first distal phalanx. Electronically Signed   By: Lupita Raider M.D.   On: 10/31/2023 12:23    Microbiology: Recent Results (from the past 240 hours)  Blood culture (routine x 2)     Status: None   Collection Time: 11/16/23  4:01 PM   Specimen: BLOOD  Result Value Ref Range Status   Specimen Description BLOOD BLOOD RIGHT ARM  Final   Special Requests   Final    BOTTLES DRAWN AEROBIC AND ANAEROBIC Blood Culture results may not be optimal due  to an inadequate volume of blood received in culture bottles   Culture   Final    NO GROWTH 5 DAYS Performed at [facility], [address] [place], [place] [address]    Report Status 11/21/2023 FINAL  Final  Blood culture (routine x 2)     Status: None   Collection Time: 11/16/23  4:01 PM   Specimen: BLOOD  Result Value Ref Range Status   Specimen Description BLOOD LEFT ANTECUBITAL  Final   Special Requests   Final    BOTTLES DRAWN AEROBIC AND ANAEROBIC Blood Culture results may not be optimal due to an inadequate volume of blood received in culture bottles   Culture   Final    NO GROWTH 5 DAYS Performed at [facility], [address] [place], [place] [address]    Report Status 11/21/2023 FINAL  Final  Resp panel by RT-PCR (RSV, Flu A&B, Covid) Anterior Nasal Swab     Status: None   Collection Time: 11/16/23  4:27 PM   Specimen: Anterior Nasal Swab  Result Value Ref Range Status   SARS Coronavirus 2 by RT PCR NEGATIVE NEGATIVE Final    Comment: (NOTE) SARS-CoV-2 target nucleic acids are NOT DETECTED.  The SARS-CoV-2 RNA is generally detectable in upper respiratory specimens during the acute phase of infection. The lowest concentration of SARS-CoV-2 viral copies this assay can detect is 138 copies/mL. A negative result does not preclude SARS-Cov-2 infection and should not be used as the sole basis for treatment or other patient management decisions. A negative result may occur with  improper specimen collection/handling, submission of specimen other than nasopharyngeal swab, presence of viral mutation(s) within the areas targeted by this assay, and inadequate number of viral copies(<138 copies/mL). A negative result must be combined with clinical observations, patient history, and epidemiological information. The expected result  is Negative.  Fact Sheet for Patients:  BloggerCourse.com  Fact Sheet for Healthcare Providers:   SeriousBroker.it  This test is no t yet approved or cleared by the [place] FDA and  has been authorized for detection and/or diagnosis of SARS-CoV-2 by FDA under an Emergency Use Authorization (EUA). This EUA will remain  in effect (meaning this test can be used) for the duration of the COVID-19 declaration under Section 564(b)(1) of the Act, 21 U.S.C.section 360bbb-3(b)(1), unless the authorization is terminated  or revoked sooner.       Influenza A by PCR NEGATIVE NEGATIVE Final   Influenza B by PCR NEGATIVE NEGATIVE Final    Comment: (NOTE) The Xpert Xpress SARS-CoV-2/FLU/RSV plus assay is intended as an aid in the diagnosis of influenza from Nasopharyngeal swab specimens and should not be used as a sole basis for treatment. Nasal washings and aspirates are unacceptable for Xpert Xpress SARS-CoV-2/FLU/RSV testing.  Fact Sheet for Patients: BloggerCourse.com  Fact Sheet for Healthcare Providers: SeriousBroker.it  This test is not yet approved or cleared by the [place] FDA and has been authorized for detection and/or diagnosis of SARS-CoV-2 by FDA under an Emergency Use Authorization (EUA). This EUA will remain in effect (meaning this test can be used) for the duration of the COVID-19 declaration under Section 564(b)(1) of the Act, 21 U.S.C. section 360bbb-3(b)(1), unless the authorization is terminated or revoked.     Resp Syncytial Virus by PCR NEGATIVE NEGATIVE Final    Comment: (NOTE) Fact Sheet for Patients: BloggerCourse.com  Fact Sheet for Healthcare Providers: SeriousBroker.it  This test is not yet approved or cleared by the [place] FDA and has been authorized for detection and/or diagnosis of SARS-CoV-2 by FDA under an Emergency Use Authorization (EUA). This EUA will remain in effect (meaning this test can be used) for  the duration of the COVID-19 declaration under Section 564(b)(1) of the Act, 21 U.S.C. section 360bbb-3(b)(1), unless the authorization is terminated or revoked.  Performed at [facility], [address] [place], [place] [address]   Respiratory (~20 pathogens) panel by PCR     Status: None   Collection Time: 11/17/23  3:01 PM   Specimen: Nasopharyngeal Swab; Respiratory  Result Value Ref Range Status   Adenovirus NOT DETECTED NOT DETECTED Final   Coronavirus 229E NOT DETECTED NOT DETECTED Final    Comment: (NOTE) The Coronavirus on the Respiratory Panel, DOES NOT test for the novel  Coronavirus (2019 nCoV)    Coronavirus HKU1 NOT DETECTED NOT DETECTED Final   Coronavirus NL63 NOT DETECTED NOT DETECTED Final   Coronavirus OC43 NOT DETECTED NOT DETECTED Final   Metapneumovirus NOT DETECTED NOT DETECTED Final   Rhinovirus / Enterovirus NOT DETECTED NOT DETECTED Final   Influenza A NOT DETECTED NOT DETECTED Final   Influenza B NOT DETECTED NOT DETECTED Final   Parainfluenza Virus 1 NOT DETECTED NOT DETECTED Final   Parainfluenza Virus 2 NOT DETECTED NOT DETECTED Final   Parainfluenza Virus 3 NOT DETECTED NOT DETECTED Final   Parainfluenza Virus 4 NOT DETECTED NOT DETECTED Final   Respiratory Syncytial Virus NOT DETECTED NOT DETECTED Final   Bordetella pertussis NOT DETECTED NOT DETECTED Final   Bordetella Parapertussis NOT DETECTED NOT DETECTED Final   Chlamydophila pneumoniae NOT DETECTED NOT DETECTED Final   Mycoplasma pneumoniae NOT DETECTED NOT DETECTED Final    Comment: Performed at [facility], [address] [place], [place] [address]   Time spent: 20 minutes  Signed: Emy Angevine L  Dareen Piano, MD 12/17/2023

## 2023-12-25 NOTE — Progress Notes (Signed)
   12/07/2023 0600  Spiritual Encounters  Type of Visit Follow up  Care provided to: Family  Referral source Physician  Reason for visit Patient death  OnCall Visit Yes  Spiritual Framework  Presenting Themes Impactful experiences and emotions  Family Stress Factors Loss  Interventions  Spiritual Care Interventions Made Supported grief process;Prayer;Bereavement/grief support   Chaplain provided care and support to husband of patient who died.

## 2023-12-25 NOTE — Progress Notes (Signed)
   11/30/2023 0400  Spiritual Encounters  Type of Visit Initial  Referral source Code page  Reason for visit Code  OnCall Visit Yes   At 0412 Chaplain responded to Code Blue. Patient being attended to be care team. No family members present.

## 2023-12-25 NOTE — ED Provider Notes (Addendum)
 [facility] Department of Emergency Medicine   Code Blue CONSULT NOTE  Chief Complaint: Cardiac arrest/unresponsive   Level V Caveat: Unresponsive  History of present illness: I was contacted by the hospital for a CODE BLUE cardiac arrest upstairs and presented to the patient's bedside.    Nursing reported that the patient was in first a rapid tachycardia and then went unresponsive with no palpable pulse.  Chest compressions had started when I arrived and the hospitalist NP managing the patient's case was present in the room to run CPR.  See hospital course for additional details.  ROS: Unable to obtain, Level V caveat  Scheduled Meds:  amoxicillin-clavulanate  1 tablet Oral Q12H   aspirin EC  81 mg Oral Daily   clopidogrel  75 mg Oral Daily   enoxaparin (LOVENOX) injection  0.5 mg/kg Subcutaneous Q24H   ezetimibe  10 mg Oral QHS   FLUoxetine  40 mg Oral QHS   furosemide  40 mg Intravenous Q12H   gabapentin  900 mg Oral QHS   insulin pump   Subcutaneous TID WC, HS, 0200   ipratropium-albuterol  3 mL Nebulization Q6H   loratadine  10 mg Oral Daily   pantoprazole  40 mg Oral QHS   [START ON 11/26/2023] pneumococcal 20-valent conjugate vaccine  0.5 mL Intramuscular Tomorrow-1000   Continuous Infusions: PRN Meds:.acetaminophen, albuterol, dextromethorphan-guaiFENesin, docusate sodium, ondansetron (ZOFRAN) IV, oxyCODONE Past Medical History:  Diagnosis Date   Allergy    Anxiety    Arthritis    Asthma    Depression    Diabetes mellitus without complication (HCC)    GERD (gastroesophageal reflux disease)    History of chicken pox 04/07/2015   DID have Chicken Pox.     Hyperlipidemia    Hypertension    Neuromuscular disorder (HCC)    Venous (peripheral) insufficiency    Past Surgical History:  Procedure Laterality Date   APPENDECTOMY  1979   APPLICATION OF WOUND VAC Right 01/07/2021   Procedure: APPLICATION OF WOUND VAC;  Surgeon: Edwin Cap, DPM;   Location: [facility] OR[facility];  Service: Podiatry;  Laterality: Right;   BILATERAL CARPAL TUNNEL RELEASE Bilateral    Dr. Rosita Kea   CESAREAN SECTION  2003   GRAFT APPLICATION Right 01/07/2021   Procedure: GRAFT APPLICATION;  Surgeon: Edwin Cap, DPM;  Location: [facility];  Service: Podiatry;  Laterality: Right;   INCISION AND DRAINAGE Right 12/01/2020   Procedure: INCISION AND DRAINAGE;  Surgeon: Edwin Cap, DPM;  Location: [facility];  Service: Podiatry;  Laterality: Right;   INCISION AND DRAINAGE Right 12/07/2020   Procedure: INCISION AND DRAINAGE;  Surgeon: [name], DPM;  Location: [facility];  Service: Podiatry;  Laterality: Right;   INCISION AND DRAINAGE OF WOUND Right 01/07/2021   Procedure: IRRIGATION AND DEBRIDEMENT WOUND;  Surgeon: Edwin Cap, DPM;  Location: [facility];  Service: Podiatry;  Laterality: Right;  regional block if possible   LAPAROSCOPIC LYSIS OF ADHESIONS     LOWER EXTREMITY ANGIOGRAPHY Right 11/01/2023   Procedure: Lower Extremity Angiography;  Surgeon: Annice Needy, MD;  Location: [facility];  Service: Cardiovascular;  Laterality: Right;   SHOULDER SURGERY Right 08/2009   Dr. Rosita Kea   TUBAL LIGATION     Social History   Socioeconomic History   Marital status: Married    Spouse name: John   Number of children: 3   Years of education: H/S   Highest education level: Not on file  Occupational  History   Occupation: Housewife  Tobacco Use   Smoking status: Former    Current packs/day: 0.00    Average packs/day: 1 pack/day for 32.5 years (32.5 ttl pk-yrs)    Types: Cigarettes    Start date: 04/06/1985    Quit date: 09/25/2017    Years since quitting: 6.1   Smokeless tobacco: Never  Vaping Use   Vaping status: Never Used  Substance and Sexual Activity   Alcohol use: Yes    Alcohol/week: 1.0 - 2.0 standard drink of alcohol    Types: 1 - 2 Glasses of wine per week    Comment: Socially   Drug use: No   Sexual activity: Not Currently  Other Topics  Concern   Not on file  Social History Narrative   Not on file   Social Drivers of Health   Financial Resource Strain: Medium Risk (11/12/2023)   Received from [facility]   Overall Financial Resource Strain (CARDIA)    Difficulty of Paying Living Expenses: Somewhat hard  Food Insecurity: No Food Insecurity (12/03/2023)   Hunger Vital Sign    Worried About Running Out of Food in the Last Year: Never true    Ran Out of Food in the Last Year: Never true  Recent Concern: Food Insecurity - Food Insecurity Present (11/12/2023)   Received from [facility]   Hunger Vital Sign    Worried About Running Out of Food in the Last Year: Often true    Ran Out of Food in the Last Year: Often true  Transportation Needs: No Transportation Needs (12/22/2023)   PRAPARE - Administrator, Civil Service (Medical): No    Lack of Transportation (Non-Medical): No  Physical Activity: Not on file  Stress: Not on file  Social Connections: Socially Integrated (12/03/2023)   Social Connection and Isolation Panel [NHANES]    Frequency of Communication with Friends and Family: More than three times a week    Frequency of Social Gatherings with Friends and Family: More than three times a week    Attends Religious Services: More than 4 times per year    Active Member of [employer] or Organizations: Yes    Attends Banker Meetings: 1 to 4 times per year    Marital Status: Married  Catering manager Violence: Not At Risk (11/27/2023)   Humiliation, Afraid, Rape, and Kick questionnaire    Fear of Current or Ex-Partner: No    Emotionally Abused: No    Physically Abused: No    Sexually Abused: No   Allergies  Allergen Reactions   Statins Other (See Comments)    Myalgias    Atorvastatin     Muscle and join pains.   Levaquin  [Levofloxacin In D5w]     GI upset   Simvastatin     joint aches.   Dilaudid  [Hydromorphone Hcl] Hives, Itching and Rash   Tetracycline  Rash    Last set of Vital Signs (not current) Vitals:   11/29/2023 0115 12/12/2023 0406  BP: (!) 95/45 115/62  Pulse: 90 93  Resp: 20 16  Temp: 98 F (36.7 C) 98.8 F (37.1 C)  SpO2: 97% 96%      Physical Exam  Gen: unresponsive Cardiovascular: No palpable pulse but with a very fine tachycardia or fibrillation on the monitor.  Pale and mottled Resp: Agonal respirations Abd: nondistended  Neuro: GCS 3, unresponsive to pain  HEENT: No blood in posterior pharynx, gag reflex absent  Neck: No crepitus  Musculoskeletal: No deformity  Skin: warm, but pale and mottled  Procedures (when applicable, including Critical Care time): .Critical Care  Performed by: Loleta Rose, MD Authorized by: Loleta Rose, MD   Critical care provider statement:    Critical care time (minutes):  20   Critical care time was exclusive of:  Separately billable procedures and treating other patients   Critical care was time spent personally by me on the following activities:  Development of treatment plan with patient or surrogate, evaluation of patient's response to treatment, examination of patient, obtaining history from patient or surrogate, ordering and performing treatments and interventions, ordering and review of laboratory studies, ordering and review of radiographic studies, pulse oximetry, re-evaluation of patient's condition and review of old charts Procedure Name: Intubation Date/Time: 12/10/2023 4:20 AM  Performed by: Loleta Rose, MDPre-anesthesia Checklist: Patient identified, Emergency Drugs available, Suction available and Patient being monitored Preoxygenation: Pre-oxygenation with 100% oxygen Induction Type: IV induction and Rapid sequence Laryngoscope Size: Glidescope Tube size: 7.5 mm Number of attempts: 1 Placement Confirmation: ETT inserted through vocal cords under direct vision, CO2 detector and Breath sounds checked- equal and bilateral Dental Injury: Teeth and Oropharynx as per  pre-operative assessment        MDM / Assessment and Plan Patient with non-perfusing rhythm, no palpable pulse, but with agonal respirations.  Steward Drone the NP primarily let CPR but I offered advice and recommendations.  On pulse check when we did not find a pulse but we found some sort of organized rhythm on the monitor (again, fine V-fib versus irregular tachycardia), I recommended initially synchronized cardioversion at 100 J, then on a subsequent pulse check with a similar rhythm I recommended defibrillation at 200 J.  In neither case did it change the rhythm and CPR continued for multiple rounds of epinephrine.  See nursing notes for additional details.  Although the patient was unresponsive, she had strong muscle tone and I had to administer RSI meds in order to intubate her.  She received a total of 20 mg of IV etomidate and 200 mg of IV succinylcholine because after an appropriate interval of time after 100 mg were administered to, the patient still had her jaw clenched and I was unable to access.  After the full dose as document above, I was able to access and quickly intubate the patient during a pulse check.  I left the code once airway had been established and both Steward Drone with the hospitalist service and Webb Silversmith, PCCM NP, were present in the room and directing the code and planning for her continued care.    Loleta Rose, MD 12/05/2023 0102    Loleta Rose, MD 12/12/2023 [phone]

## 2023-12-25 NOTE — Progress Notes (Signed)
 CODE BLUE NOTE  Patient Name: Emily Collins   MRN: [id number]   Date of Birth/ Sex: 1966/06/16 , female      Admission Date: 11/23/2023  Attending Provider: Leeroy Bock, MD  Primary Diagnosis: Acute on chronic systolic CHF (congestive heart failure) [facility])   Cardiopulmonary Resuscitation Directed by: Manuela Schwartz NP   I personally directed ancillary staff and/or performed CPR in an effort to regain return of spontaneous circulation. Dr York Cerise MD, and myself present in room.  Indication: Pt was in her usual state of health until this 0412 am, when she was noted to be v fib. Code blue was subsequently called. At the time of arrival on scene, ACLS protocol was underway.  Husband contacted over phone. CPR stopped per request. Time of death 0444 am     Technical Description:  - CPR performance duration:  32 minutes  - Was defibrillation or cardioversion used? Yes   - Was external pacer placed? No  - Was patient intubated pre/post CPR? Yes    Medications Administered: Y = Yes; Blank = No Amiodarone  300  Atropine    Calcium  3  Epinephrine    Lidocaine  4  Magnesium  2 gm  Norepinephrine    Phenylephrine    Sodium bicarbonate  3  Vasopressin   D50              Post CPR evaluation:  - Final Status - Was patient successfully resuscitated ?no - What is current rhythm? na - What is current hemodynamic status? na   Miscellaneous Information:  - Labs sent, including:   - Primary team notified?    - Family Notified? Yes  - Additional notes/ transfer status:       ACLS H's and T's  -Hypovolemia  -Hypoxia -Hydrogen Ion excess (acidosis) -Hypoglycemia -Hypokalemia / Hyperkalemia  -Hypothermia  -Tension pneumothorax  -Toxins  -Thrombosis PE / MI  Donnie Mesa NP [facility] Cross Cover 7pm-7am - check amion for availability Pager [phone]

## 2023-12-25 DEATH — deceased
